# Patient Record
Sex: Male | Born: 1950 | Race: Black or African American | Hispanic: No | Marital: Married | Smoking: Former smoker
Health system: Southern US, Community
[De-identification: ages and names within clinical notes are randomized; demographics above are authoritative.]

## PROBLEM LIST (undated history)

## (undated) DIAGNOSIS — R7302 Impaired glucose tolerance (oral): Secondary | ICD-10-CM

## (undated) DIAGNOSIS — D352 Benign neoplasm of pituitary gland: Secondary | ICD-10-CM

## (undated) DIAGNOSIS — E785 Hyperlipidemia, unspecified: Secondary | ICD-10-CM

## (undated) DIAGNOSIS — M1990 Unspecified osteoarthritis, unspecified site: Secondary | ICD-10-CM

## (undated) HISTORY — DX: Impaired glucose tolerance (oral): R73.02

## (undated) HISTORY — DX: Unspecified osteoarthritis, unspecified site: M19.90

## (undated) HISTORY — DX: Hyperlipidemia, unspecified: E78.5

## (undated) HISTORY — DX: Benign neoplasm of pituitary gland: D35.2

---

## 2006-10-06 DIAGNOSIS — D352 Benign neoplasm of pituitary gland: Secondary | ICD-10-CM

## 2006-10-06 HISTORY — DX: Benign neoplasm of pituitary gland: D35.2

## 2007-07-28 ENCOUNTER — Encounter: Admission: RE | Admit: 2007-07-28 | Discharge: 2007-07-28 | Payer: Self-pay | Admitting: Neurosurgery

## 2010-05-28 ENCOUNTER — Encounter: Admission: RE | Admit: 2010-05-28 | Discharge: 2010-05-28 | Payer: Self-pay | Admitting: Family Medicine

## 2010-07-28 ENCOUNTER — Encounter: Payer: Self-pay | Admitting: Family Medicine

## 2010-07-30 ENCOUNTER — Encounter: Payer: Self-pay | Admitting: Neurosurgery

## 2011-07-09 ENCOUNTER — Ambulatory Visit (INDEPENDENT_AMBULATORY_CARE_PROVIDER_SITE_OTHER): Payer: PRIVATE HEALTH INSURANCE

## 2011-07-09 DIAGNOSIS — J3081 Allergic rhinitis due to animal (cat) (dog) hair and dander: Secondary | ICD-10-CM

## 2011-07-09 DIAGNOSIS — J069 Acute upper respiratory infection, unspecified: Secondary | ICD-10-CM

## 2012-01-22 ENCOUNTER — Ambulatory Visit: Payer: PRIVATE HEALTH INSURANCE

## 2012-01-22 ENCOUNTER — Ambulatory Visit (INDEPENDENT_AMBULATORY_CARE_PROVIDER_SITE_OTHER): Payer: PRIVATE HEALTH INSURANCE | Admitting: Emergency Medicine

## 2012-01-22 VITALS — BP 126/70 | HR 86 | Temp 98.0°F | Resp 16 | Ht 65.0 in | Wt 166.0 lb

## 2012-01-22 DIAGNOSIS — M79609 Pain in unspecified limb: Secondary | ICD-10-CM

## 2012-01-22 DIAGNOSIS — M79644 Pain in right finger(s): Secondary | ICD-10-CM

## 2012-01-22 NOTE — Progress Notes (Signed)
  Subjective:    Patient ID: Earl Thomas, male    DOB: May 10, 1951, 61 y.o.   MRN: [id number]  HPI patient was in good health until last week when while grilling he stuck his hand into the grill and when he pulled it out he was unable to extend his distal phalanx of the right middle finger    Review of Systems     Objective:   Physical Exam patient is unable to extend at the DIP joint right middle    [facility] reading (PRIMARY) by  Dr.Nancie Bocanegra no fractures seen       Assessment & Plan:  Patient has a rupture of the extensor tendon to right middle finger. He is placed in a Stax splint advised this will take 6-8 weeks to heal. He was given the option to see an orthopedist and chooses not to see them at the present time but will let us know if he desires orthopedic referral .

## 2012-02-11 ENCOUNTER — Ambulatory Visit (INDEPENDENT_AMBULATORY_CARE_PROVIDER_SITE_OTHER): Payer: PRIVATE HEALTH INSURANCE | Admitting: Family Medicine

## 2012-02-11 VITALS — BP 130/80 | HR 98 | Temp 98.5°F | Resp 16 | Ht 65.5 in | Wt 164.0 lb

## 2012-02-11 DIAGNOSIS — M6688 Spontaneous rupture of other tendons, other: Secondary | ICD-10-CM

## 2012-02-11 DIAGNOSIS — E785 Hyperlipidemia, unspecified: Secondary | ICD-10-CM

## 2012-02-11 DIAGNOSIS — Z0000 Encounter for general adult medical examination without abnormal findings: Secondary | ICD-10-CM

## 2012-02-11 DIAGNOSIS — M66849 Spontaneous rupture of other tendons, unspecified hand: Secondary | ICD-10-CM

## 2012-02-11 LAB — POCT CBC
Granulocyte percent: 53 %G (ref 37–80)
HCT, POC: 50.4 % (ref 43.5–53.7)
Hemoglobin: 16 g/dL (ref 14.1–18.1)
Lymph, poc: 1.8 (ref 0.6–3.4)
MCH, POC: 28.2 pg (ref 27–31.2)
MCHC: 31.7 g/dL — AB (ref 31.8–35.4)
MCV: 88.9 fL (ref 80–97)
MID (cbc): 0.4 (ref 0–0.9)
MPV: 9.6 fL (ref 0–99.8)
POC Granulocyte: 2.5 (ref 2–6.9)
POC LYMPH PERCENT: 39.2 %L (ref 10–50)
POC MID %: 7.8 %M (ref 0–12)
Platelet Count, POC: 191 10*3/uL (ref 142–424)
RBC: 5.67 M/uL (ref 4.69–6.13)
RDW, POC: 14.9 %
WBC: 4.7 10*3/uL (ref 4.6–10.2)

## 2012-02-11 LAB — COMPREHENSIVE METABOLIC PANEL
ALT: 27 U/L (ref 0–53)
AST: 24 U/L (ref 0–37)
Albumin: 4.4 g/dL (ref 3.5–5.2)
Alkaline Phosphatase: 42 U/L (ref 39–117)
BUN: 11 mg/dL (ref 6–23)
CO2: 26 mEq/L (ref 19–32)
Calcium: 9.3 mg/dL (ref 8.4–10.5)
Chloride: 104 mEq/L (ref 96–112)
Creat: 1.29 mg/dL (ref 0.50–1.35)
Glucose, Bld: 81 mg/dL (ref 70–99)
Potassium: 4.4 mEq/L (ref 3.5–5.3)
Sodium: 140 mEq/L (ref 135–145)
Total Bilirubin: 0.8 mg/dL (ref 0.3–1.2)
Total Protein: 7.2 g/dL (ref 6.0–8.3)

## 2012-02-11 LAB — POCT URINALYSIS DIPSTICK
Bilirubin, UA: NEGATIVE
Blood, UA: NEGATIVE
Glucose, UA: NEGATIVE
Ketones, UA: NEGATIVE
Leukocytes, UA: NEGATIVE
Nitrite, UA: NEGATIVE
Protein, UA: NEGATIVE
Spec Grav, UA: 1.02
Urobilinogen, UA: 0.2
pH, UA: 6

## 2012-02-11 LAB — POCT UA - MICROSCOPIC ONLY
Bacteria, U Microscopic: NEGATIVE
Casts, Ur, LPF, POC: NEGATIVE
Crystals, Ur, HPF, POC: NEGATIVE
Epithelial cells, urine per micros: NEGATIVE
Mucus, UA: NEGATIVE
RBC, urine, microscopic: NEGATIVE
WBC, Ur, HPF, POC: NEGATIVE
Yeast, UA: NEGATIVE

## 2012-02-11 LAB — IFOBT (OCCULT BLOOD): IFOBT: NEGATIVE

## 2012-02-11 LAB — LIPID PANEL
Cholesterol: 222 mg/dL — ABNORMAL HIGH (ref 0–200)
HDL: 41 mg/dL (ref >39)
LDL Cholesterol: 163 mg/dL — ABNORMAL HIGH (ref 0–99)
Total CHOL/HDL Ratio: 5.4 Ratio
Triglycerides: 92 mg/dL (ref <150)
VLDL: 18 mg/dL (ref 0–40)

## 2012-02-11 NOTE — Patient Instructions (Signed)
Return next month in about 4 weeks for a recheck of that finger so we can get you out of the splint.

## 2012-02-11 NOTE — Progress Notes (Signed)
CPE:  Patient is here for his annual physical examination. He has been doing well. No major problems or concerns. He had an extensor tendon rupture his right third finger that Dr. Everlene Farrier treat him for recently. He is continuing to wear the splint. It has been about 3 weeks. No major new problems or concerns.  Past medical history: Patient has a history of hyperlipidemia labs were checked at the last physical one year ago with cholesterol 209 and LDL 144. This was much better than the values a year earlier. Past surgical history: None. Has had a colonoscopy. Vacation allergies: None Current medications: Pravachol 40 mg daily Aspirin 1 daily Vitamin C and D3 and E   Family history: Mother is living, soon-to-be 76, has had some heart problems. Father deceased Siblings and children all living  Social history: Patient is married, and his wife is his only sexual partner. He does not smoke drink or use alcohol. 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 He regularly goes to [place] where he is part of a conference.  Review of systems: Constitutional: Unremarkable HEENT: Unremarkable Respiratory: Unremarkable Cardiovascular: Unremarkable Gastrointestinal: Unremarkable. He does get some growling of his abdomen. 2 urinary unremarkable Musculoskeletal unremarkable. He gets some discomfort in his hands and works the joints and it seems to relieve it. The middle finger of the right hand is as noted above. Dermatologic: Unremarkable Neurologic: Unremarkable Hematologic: Unremarkable Psychiatric: Unremarkable Endocrinologic: Unremarkable  Physical examination: Well-developed well-nourished man in no acute distress. TMs are normal. Eyes PERRLA. Throat clear. Teeth good. Neck supple without nodes or thyromegaly. No carotid bruits.  Chest clear to auscultation. Heart regular without murmurs gallops or arrhythmias. No axillary or inguinal nodes. Abdomen soft without masses or tenderness. Normal male external genitalia with testes descended. No nodules were noted. Digital rectal exam reveals no hemorrhoids and prostate gland feels normal except for right lobe of the gland is a little larger than left but no nodules. I note that his last PSA was entirely normal. Extremities unremarkable.  Assessment: Normal physical examination Hyperlipidemia Mild asymmetry of prostate gland Extensor tendon rupture right third finger  Plan: Check labs Represcribed his medication Return annually or when necessary

## 2012-02-12 LAB — PSA: PSA: 0.73 ng/mL (ref <=4.00)

## 2012-02-13 ENCOUNTER — Telehealth: Payer: Self-pay

## 2012-02-13 NOTE — Telephone Encounter (Signed)
RX FROM RECENT VISIT WAS SUPPOSED TO BE CALLED IN TO PHARMACY AND IS NOT THERE.  [facility] [phone]  [facility] [place], [place].

## 2012-02-14 MED ORDER — PRAVASTATIN SODIUM 40 MG PO TABS: 40.0000 mg | ORAL_TABLET | Freq: Every day | ORAL | Status: DC

## 2012-02-14 NOTE — Telephone Encounter (Signed)
Called pt to advise was sent in for him.

## 2012-02-16 ENCOUNTER — Encounter: Payer: Self-pay | Admitting: Family Medicine

## 2012-03-10 ENCOUNTER — Ambulatory Visit (INDEPENDENT_AMBULATORY_CARE_PROVIDER_SITE_OTHER): Payer: PRIVATE HEALTH INSURANCE | Admitting: Family Medicine

## 2012-03-10 VITALS — BP 110/63 | HR 84 | Temp 97.7°F | Resp 18 | Ht 64.75 in | Wt 167.0 lb

## 2012-03-10 DIAGNOSIS — M20019 Mallet finger of unspecified finger(s): Secondary | ICD-10-CM

## 2012-03-10 DIAGNOSIS — E785 Hyperlipidemia, unspecified: Secondary | ICD-10-CM

## 2012-03-10 HISTORY — DX: Hyperlipidemia, unspecified: E78.5

## 2012-03-10 MED ORDER — PRAVASTATIN SODIUM 40 MG PO TABS: 40.0000 mg | ORAL_TABLET | Freq: Every day | ORAL | Status: DC

## 2012-03-10 NOTE — Progress Notes (Signed)
Subjective: Patient is here for recheck of the mallet finger. He thinks he is doing better. When he has the dressing off it stays straight.  Objective: Fingers is straight, can minimally flex it.  Assessment: Mallet finger secondary to tendon rupture, much improved  Plan: Gently advance activity. Avoid carrying heavy things in that hand. Return if problems. Can leave brace off.

## 2012-03-10 NOTE — Patient Instructions (Addendum)
Gentle finger exercises .  Return if problems.

## 2012-05-19 ENCOUNTER — Other Ambulatory Visit: Payer: Self-pay | Admitting: Family Medicine

## 2012-10-22 ENCOUNTER — Ambulatory Visit (INDEPENDENT_AMBULATORY_CARE_PROVIDER_SITE_OTHER): Payer: PRIVATE HEALTH INSURANCE | Admitting: Family Medicine

## 2012-10-22 VITALS — BP 133/78 | HR 88 | Temp 97.9°F | Resp 16 | Ht 65.0 in | Wt 161.0 lb

## 2012-10-22 DIAGNOSIS — R300 Dysuria: Secondary | ICD-10-CM

## 2012-10-22 DIAGNOSIS — N390 Urinary tract infection, site not specified: Secondary | ICD-10-CM

## 2012-10-22 LAB — POCT URINALYSIS DIPSTICK
Bilirubin, UA: NEGATIVE
Blood, UA: NEGATIVE
Glucose, UA: NEGATIVE
Ketones, UA: NEGATIVE
Leukocytes, UA: NEGATIVE
Nitrite, UA: NEGATIVE
Protein, UA: NEGATIVE
Spec Grav, UA: 1.025
Urobilinogen, UA: 0.2
pH, UA: 6.5

## 2012-10-22 LAB — POCT UA - MICROSCOPIC ONLY
Bacteria, U Microscopic: 2
Casts, Ur, LPF, POC: NEGATIVE
Crystals, Ur, HPF, POC: NEGATIVE
Yeast, UA: NEGATIVE

## 2012-10-22 MED ORDER — CIPROFLOXACIN HCL 250 MG PO TABS: 250.0000 mg | ORAL_TABLET | Freq: Two times a day (BID) | ORAL | Status: DC

## 2012-10-22 NOTE — Patient Instructions (Signed)
Urinary Tract Infection Urinary tract infections (UTIs) can develop anywhere along your urinary tract. Your urinary tract is your body's drainage system for removing wastes and extra water. Your urinary tract includes two kidneys, two ureters, a bladder, and a urethra. Your kidneys are a pair of bean-shaped organs. Each kidney is about the size of your fist. They are located below your ribs, one on each side of your spine. CAUSES Infections are caused by microbes, which are microscopic organisms, including fungi, viruses, and bacteria. These organisms are so small that they can only be seen through a microscope. Bacteria are the microbes that most commonly cause UTIs. SYMPTOMS  Symptoms of UTIs may vary by age and gender of the patient and by the location of the infection. Symptoms in young women typically include a frequent and intense urge to urinate and a painful, burning feeling in the bladder or urethra during urination. Older women and men are more likely to be tired, shaky, and weak and have muscle aches and abdominal pain. A fever may mean the infection is in your kidneys. Other symptoms of a kidney infection include pain in your back or sides below the ribs, nausea, and vomiting. DIAGNOSIS To diagnose a UTI, your caregiver will ask you about your symptoms. Your caregiver also will ask to provide a urine sample. The urine sample will be tested for bacteria and white blood cells. White blood cells are made by your body to help fight infection. TREATMENT  Typically, UTIs can be treated with medication. Because most UTIs are caused by a bacterial infection, they usually can be treated with the use of antibiotics. The choice of antibiotic and length of treatment depend on your symptoms and the type of bacteria causing your infection. HOME CARE INSTRUCTIONS  If you were prescribed antibiotics, take them exactly as your caregiver instructs you. Finish the medication even if you feel better after you  have only taken some of the medication.  Drink enough water and fluids to keep your urine clear or pale yellow.  Avoid caffeine, tea, and carbonated beverages. They tend to irritate your bladder.  Empty your bladder often. Avoid holding urine for long periods of time.  Empty your bladder before and after sexual intercourse.  After a bowel movement, women should cleanse from front to back. Use each tissue only once. SEEK MEDICAL CARE IF:   You have back pain.  You develop a fever.  Your symptoms do not begin to resolve within 3 days. SEEK IMMEDIATE MEDICAL CARE IF:   You have severe back pain or lower abdominal pain.  You develop chills.  You have nausea or vomiting.  You have continued burning or discomfort with urination. MAKE SURE YOU:   Understand these instructions.  Will watch your condition.  Will get help right away if you are not doing well or get worse. Document Released: 04/03/2005 Document Revised: 12/24/2011 Document Reviewed: 08/02/2011 [facility] Patient Information 2013 [place].   Take antibiotic one twice daily. If problems persist please return.

## 2012-10-22 NOTE — Progress Notes (Signed)
Subjective: 62 year old man who's been having some dysuria for the past week. He had a problem about a year ago with this. He's not been any fever. No flank pain. No nausea or vomiting. No nocturia. It just burns when he pees. He has no history of STDs. No sex outside of marriage.  Objective: No CVA tenderness. Abdomen soft without mass or tenderness. Normal male external genitalia. Penis appears normal. No discharge. No hernias.  Results for orders placed in visit on 10/22/12  POCT URINALYSIS DIPSTICK      Result Value Range   Color, UA yellow     Clarity, UA clear     Glucose, UA neg     Bilirubin, UA neg     Ketones, UA neg     Spec Grav, UA 1.025     Blood, UA neg     pH, UA 6.5     Protein, UA neg     Urobilinogen, UA 0.2     Nitrite, UA neg     Leukocytes, UA Negative    POCT UA - MICROSCOPIC ONLY      Result Value Range   WBC, Ur, HPF, POC 8-19     RBC, urine, microscopic 3-11     Bacteria, U Microscopic 2     Mucus, UA large     Epithelial cells, urine per micros 0-3     Crystals, Ur, HPF, POC neg     Casts, Ur, LPF, POC neg     Yeast, UA neg     Assessment: UTI  Plan: Cipro 250 twice a day  Return if worse.

## 2012-10-23 LAB — URINE CULTURE
Colony Count: NO GROWTH
Organism ID, Bacteria: NO GROWTH

## 2013-01-27 ENCOUNTER — Ambulatory Visit (INDEPENDENT_AMBULATORY_CARE_PROVIDER_SITE_OTHER): Payer: PRIVATE HEALTH INSURANCE | Admitting: Family Medicine

## 2013-01-27 VITALS — BP 104/62 | HR 81 | Temp 97.8°F | Resp 18 | Ht 68.0 in | Wt 168.6 lb

## 2013-01-27 DIAGNOSIS — E785 Hyperlipidemia, unspecified: Secondary | ICD-10-CM

## 2013-01-27 DIAGNOSIS — Z0000 Encounter for general adult medical examination without abnormal findings: Secondary | ICD-10-CM

## 2013-01-27 DIAGNOSIS — R319 Hematuria, unspecified: Secondary | ICD-10-CM

## 2013-01-27 LAB — POCT URINALYSIS DIPSTICK
Bilirubin, UA: NEGATIVE
Glucose, UA: NEGATIVE
Ketones, UA: NEGATIVE
Leukocytes, UA: NEGATIVE
Nitrite, UA: NEGATIVE
Protein, UA: NEGATIVE
Spec Grav, UA: 1.02
Urobilinogen, UA: 0.2
pH, UA: 5.5

## 2013-01-27 LAB — POCT UA - MICROSCOPIC ONLY
Bacteria, U Microscopic: NEGATIVE
Casts, Ur, LPF, POC: NEGATIVE
Crystals, Ur, HPF, POC: NEGATIVE
Epithelial cells, urine per micros: NEGATIVE
Mucus, UA: NEGATIVE
RBC, urine, microscopic: NEGATIVE
WBC, Ur, HPF, POC: NEGATIVE
Yeast, UA: NEGATIVE

## 2013-01-27 LAB — POCT CBC
Granulocyte percent: 53.1 %G (ref 37–80)
HCT, POC: 48.1 % (ref 43.5–53.7)
Hemoglobin: 15.4 g/dL (ref 14.1–18.1)
Lymph, poc: 1.7 (ref 0.6–3.4)
MCH, POC: 28.8 pg (ref 27–31.2)
MCHC: 32 g/dL (ref 31.8–35.4)
MCV: 89.9 fL (ref 80–97)
MID (cbc): 0.5 (ref 0–0.9)
MPV: 9.7 fL (ref 0–99.8)
POC Granulocyte: 2.5 (ref 2–6.9)
POC LYMPH PERCENT: 36.2 %L (ref 10–50)
POC MID %: 10.7 %M (ref 0–12)
Platelet Count, POC: 181 10*3/uL (ref 142–424)
RBC: 5.35 M/uL (ref 4.69–6.13)
RDW, POC: 14.7 %
WBC: 4.7 10*3/uL (ref 4.6–10.2)

## 2013-01-27 LAB — LIPID PANEL
Cholesterol: 234 mg/dL — ABNORMAL HIGH (ref 0–200)
HDL: 41 mg/dL (ref >39)
LDL Cholesterol: 176 mg/dL — ABNORMAL HIGH (ref 0–99)
Total CHOL/HDL Ratio: 5.7 Ratio
Triglycerides: 87 mg/dL (ref <150)
VLDL: 17 mg/dL (ref 0–40)

## 2013-01-27 LAB — COMPREHENSIVE METABOLIC PANEL
ALT: 19 U/L (ref 0–53)
AST: 20 U/L (ref 0–37)
Albumin: 4.6 g/dL (ref 3.5–5.2)
Alkaline Phosphatase: 39 U/L (ref 39–117)
BUN: 13 mg/dL (ref 6–23)
CO2: 27 mEq/L (ref 19–32)
Calcium: 9.3 mg/dL (ref 8.4–10.5)
Chloride: 104 mEq/L (ref 96–112)
Creat: 1.34 mg/dL (ref 0.50–1.35)
Glucose, Bld: 108 mg/dL — ABNORMAL HIGH (ref 70–99)
Potassium: 4.2 mEq/L (ref 3.5–5.3)
Sodium: 138 mEq/L (ref 135–145)
Total Bilirubin: 0.7 mg/dL (ref 0.3–1.2)
Total Protein: 7 g/dL (ref 6.0–8.3)

## 2013-01-27 LAB — TSH: TSH: 2.33 u[IU]/mL (ref 0.350–4.500)

## 2013-01-27 LAB — IFOBT (OCCULT BLOOD): IFOBT: NEGATIVE

## 2013-01-27 LAB — PSA: PSA: 0.67 ng/mL (ref <=4.00)

## 2013-01-27 MED ORDER — PRAVASTATIN SODIUM 40 MG PO TABS: 40.0000 mg | ORAL_TABLET | Freq: Every day | ORAL | Status: DC

## 2013-01-27 NOTE — Progress Notes (Signed)
[facility]:  Office Visit  Chief Complaint:  Chief Complaint  Patient presents with  . Annual Exam    HPI: Earl Thomas is a 62 y.o. male who complains of  Here for annual exam He is UTD on his colonoscopy, off of [place] , ? Dr. Benson [place]. He had polyps, normal pathology. He is supposed to return in 5 years.  His father may have had prostate cancer.  He goes to the bathroom once nighty, no urinary sxs Had infection 1 month ago but no sxs since Dad with history of prostate cancer He has XOL, is compliant, denies SEs  Past Medical History  Diagnosis Date  . Hyperlipidemia    History reviewed. No pertinent past surgical history. History   Social History  . Marital Status: Married    Spouse Name: N/A    Number of Children: N/A  . Years of Education: N/A   Social History Main Topics  . Smoking status: Never Smoker   . Smokeless tobacco: None  . Alcohol Use: No  . Drug Use: No  . Sexually Active: None   Other Topics Concern  . None   Social History Narrative  . None   Family History  Problem Relation Age of Onset  . Cancer Father    No Known Allergies Prior to Admission medications   Medication Sig Start Date End Date Taking? Authorizing Provider  aspirin 81 MG tablet Take 81 mg by mouth daily.   Yes Historical Provider, MD  cholecalciferol (VITAMIN D) 1000 UNITS tablet Take 1,000 Units by mouth daily.   Yes Historical Provider, MD  pravastatin (PRAVACHOL) 40 MG tablet TAKE ONE TABLET BY MOUTH EVERY DAY 05/19/12  Yes Eleanore Kurtis Bushman, PA-C  vitamin C (ASCORBIC ACID) 500 MG tablet Take 500 mg by mouth daily.   Yes Historical Provider, MD  vitamin E 400 UNIT capsule Take 400 Units by mouth daily.   Yes Historical Provider, MD  ciprofloxacin (CIPRO) 250 MG tablet Take 1 tablet (250 mg total) by mouth 2 (two) times daily. 10/22/12   Posey Boyer, MD     ROS: The patient denies fevers, chills, night sweats, unintentional weight loss, chest  pain, palpitations, wheezing, dyspnea on exertion, nausea, vomiting, abdominal pain, dysuria, hematuria, melena, numbness, weakness, or tingling.   All other systems have been reviewed and were otherwise negative with the exception of those mentioned in the HPI and as above.    PHYSICAL EXAM: Filed Vitals:   01/27/13 0905  BP: 104/62  Pulse: 81  Temp: 97.8 F (36.6 C)  Resp: 18   Filed Vitals:   01/27/13 0905  Height: 5' 8" (1.727 m)  Weight: 168 lb 9.6 oz (76.476 kg)   Body mass index is 25.64 kg/(m^2).  General: Alert, no acute distress HEENT:  Normocephalic, atraumatic, oropharynx patent. EOMI, PERRA, fundoscopic exam is normal Cardiovascular:  Regular rate and rhythm, no rubs murmurs or gallops.  No Carotid bruits, radial pulse intact. No pedal edema.  Respiratory: Clear to auscultation bilaterally.  No wheezes, rales, or rhonchi.  No cyanosis, no use of accessory musculature GI: No organomegaly, abdomen is soft and non-tender, positive bowel sounds.  No masses. Skin: No rashes. Neurologic: Facial musculature symmetric. Psychiatric: Patient is appropriate throughout our interaction. Lymphatic: No cervical lymphadenopathy Musculoskeletal: Gait intact. 5/5 strength, ROM intact 2/2 DTRs GU-normal, rectal exam is normal except for hemorrhoids   LABS: Results for orders placed in visit on 01/27/13  POCT CBC  Result Value Range   WBC 4.7  4.6 - 10.2 K/uL   Lymph, poc 1.7  0.6 - 3.4   POC LYMPH PERCENT 36.2  10 - 50 %L   MID (cbc) 0.5  0 - 0.9   POC MID % 10.7  0 - 12 %M   POC Granulocyte 2.5  2 - 6.9   Granulocyte percent 53.1  37 - 80 %G   RBC 5.35  4.69 - 6.13 M/uL   Hemoglobin 15.4  14.1 - 18.1 g/dL   HCT, POC 48.1  43.5 - 53.7 %   MCV 89.9  80 - 97 fL   MCH, POC 28.8  27 - 31.2 pg   MCHC 32.0  31.8 - 35.4 g/dL   RDW, POC 14.7     Platelet Count, POC 181  142 - 424 K/uL   MPV 9.7  0 - 99.8 fL  IFOBT (OCCULT BLOOD)      Result Value Range   IFOBT Negative     POCT UA - MICROSCOPIC ONLY      Result Value Range   WBC, Ur, HPF, POC neg     RBC, urine, microscopic neg     Bacteria, U Microscopic neg     Mucus, UA neg     Epithelial cells, urine per micros neg     Crystals, Ur, HPF, POC neg     Casts, Ur, LPF, POC neg     Yeast, UA neg    POCT URINALYSIS DIPSTICK      Result Value Range   Color, UA yellow     Clarity, UA clear     Glucose, UA neg     Bilirubin, UA neg     Ketones, UA neg     Spec Grav, UA 1.020     Blood, UA trace-intact     pH, UA 5.5     Protein, UA neg     Urobilinogen, UA 0.2     Nitrite, UA neg     Leukocytes, UA Negative       EKG/XRAY:   Primary read interpreted by Dr. Marin Comment at [facility].   ASSESSMENT/PLAN: Encounter Diagnoses  Name Primary?  . Annual physical exam Yes  . Hyperlipidemia   . Hematuria    Will f/u in 2-3 months for hematuria. Was not present last year Nonsmoker currently, smoked previor 30 yrs ago, no chemcial exposures Refilled Pravachol Annual exam labs pending    LE, Mill[place], DO 01/27/2013 10:29 AM

## 2013-05-06 ENCOUNTER — Other Ambulatory Visit: Payer: Self-pay | Admitting: Physician Assistant

## 2013-05-06 DIAGNOSIS — Z23 Encounter for immunization: Secondary | ICD-10-CM

## 2013-05-06 MED ORDER — ZOSTER VACCINE LIVE 19400 UNT/0.65ML ~~LOC~~ SOLR: 0.6500 mL | Freq: Once | SUBCUTANEOUS | Status: DC

## 2013-11-09 ENCOUNTER — Ambulatory Visit (INDEPENDENT_AMBULATORY_CARE_PROVIDER_SITE_OTHER): Payer: BC Managed Care – PPO | Admitting: Family Medicine

## 2013-11-09 VITALS — BP 110/70 | HR 90 | Temp 98.5°F | Resp 16 | Ht 65.0 in | Wt 163.0 lb

## 2013-11-09 DIAGNOSIS — K602 Anal fissure, unspecified: Secondary | ICD-10-CM

## 2013-11-09 DIAGNOSIS — K921 Melena: Secondary | ICD-10-CM

## 2013-11-09 MED ORDER — HYDROCORTISONE 2.5 % RE CREA: 1.0000 "application " | TOPICAL_CREAM | Freq: Two times a day (BID) | RECTAL | Status: DC

## 2013-11-09 NOTE — Progress Notes (Signed)
Subjective:    Patient ID: Earl Thomas, male    DOB: Apr 24, 1951, 63 y.o.   MRN: [id number]  Rectal Bleeding  Pertinent negatives include no fever, no abdominal pain, no diarrhea, no nausea and no vomiting.    This chart was scribed for Robyn Haber, MD by Erling Conte, ED Scribe. This patient was seen in room Room 3 and the patient's care was started at 9:05 AM    HPI Comments: Earl Thomas is a 63 y.o. male who presents to the [facility] complaining of intermittent episodic rectal bleeding onset for 5 months. Patient states that these episodes occur approximately every 2 weeks. He reports that the blood is dark in nature.  Patient reports that he has had 2 colonoscopy's in the past 5 years. He also states that he regular prostate exams once a year. Patient denies any associated abdominal pain, fever, nausea, emesis or diarrhea  Patient previously lived in [place], [place]. 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 Patient is originally from [place], [place].  Review of Systems  Constitutional: Negative for fever.  Gastrointestinal: Positive for hematochezia and anal bleeding. Negative for nausea, vomiting, abdominal pain and diarrhea.  All other systems reviewed and are negative.      Objective:   Physical Exam  Nursing note and vitals reviewed. Constitutional: He appears well-developed and well-nourished. No distress.  HENT:  Head: Normocephalic and atraumatic.  Neck: Neck supple.  Pulmonary/Chest: Effort normal.  Genitourinary:  Rectal exam shows posterior fissure  Neurological: He is alert.  Skin: He is not diaphoretic.   The fissure has a very irritated base.       Assessment & Plan:  Anal fissure - Plan: hydrocortisone (ANUSOL-HC) 2.5 % rectal cream  Hematochezia Recheck in 2 weeks  Signed, Robyn Haber, MD

## 2013-11-09 NOTE — Patient Instructions (Signed)
  Take Colace once a day Recheck the inflamed area in two weeks  Anal Fissure, Adult An anal fissure is a small tear or crack in the skin around the anus. Bleeding from a fissure usually stops on its own within a few minutes. However, bleeding will often reoccur with each bowel movement until the crack heals.  CAUSES   Passing large, hard stools.  Frequent diarrheal stools.  Constipation.  Inflammatory bowel disease (Crohn's disease or ulcerative colitis).  Infections.  Anal sex. SYMPTOMS   Small amounts of blood seen on your stools, on toilet paper, or in the toilet after a bowel movement.  Rectal bleeding.  Painful bowel movements.  Itching or irritation around the anus. DIAGNOSIS Your caregiver will examine the anal area. An anal fissure can usually be seen with careful inspection. A rectal exam may be performed and a short tube (anoscope) may be used to examine the anal canal. TREATMENT   You may be instructed to take fiber supplements. These supplements can soften your stool to help make bowel movements easier.  Sitz baths may be recommended to help heal the tear. Do not use soap in the sitz baths.  A medicated cream or ointment may be prescribed to lessen discomfort. HOME CARE INSTRUCTIONS   Maintain a diet high in fruits, whole grains, and vegetables. Avoid constipating foods like bananas and dairy products.  Take sitz baths as directed by your caregiver.  Drink enough fluids to keep your urine clear or pale yellow.  Only take over-the-counter or prescription medicines for pain, discomfort, or fever as directed by your caregiver. Do not take aspirin as this may increase bleeding.  Do not use ointments containing numbing medications (anesthetics) or hydrocortisone. They could slow healing. SEEK MEDICAL CARE IF:   Your fissure is not completely healed within 3 days.  You have further bleeding.  You have a fever.  You have diarrhea mixed with blood.  You  have pain.  Your problem is getting worse rather than better. MAKE SURE YOU:   Understand these instructions.  Will watch your condition.  Will get help right away if you are not doing well or get worse. Document Released: 06/24/2005 Document Revised: 09/16/2011 Document Reviewed: 12/09/2010 [facility] Patient Information 2014 [place], [place].

## 2013-11-29 ENCOUNTER — Ambulatory Visit (INDEPENDENT_AMBULATORY_CARE_PROVIDER_SITE_OTHER): Payer: BC Managed Care – PPO | Admitting: Family Medicine

## 2013-11-29 VITALS — BP 126/74 | HR 90 | Temp 98.4°F | Resp 17 | Ht 65.0 in | Wt 160.0 lb

## 2013-11-29 DIAGNOSIS — K625 Hemorrhage of anus and rectum: Secondary | ICD-10-CM

## 2013-11-29 DIAGNOSIS — K602 Anal fissure, unspecified: Secondary | ICD-10-CM

## 2013-11-29 NOTE — Progress Notes (Signed)
° °  Subjective:    Patient ID: Earl Thomas, male    DOB: 1951/03/28, 63 y.o.   MRN: [id number]  HPI Chief Complaint  Patient presents with   Follow-up    hemorrhoids    This chart was scribed for Robyn Haber, MD by Thea Alken, ED Scribe. This patient was seen in room 11 and the patient's care was started at 10:10 AM.  HPI Comments: Earl Thomas is a 63 y.o. male who presents to the [facility] here for a follow up regarding hemorrhoids. Pt reports his rectal bleeding has subsided. Pt denies abdominal pain, rectal pain and rectal bleeding. Pt reports his last colonoscopy was 2 years ago.  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  Past Medical History  Diagnosis Date   Hyperlipidemia    No Known Allergies Prior to Admission medications   Medication Sig Start Date End Date Taking? Authorizing Provider  aspirin 81 MG tablet Take 81 mg by mouth daily.   Yes Historical Provider, MD  cholecalciferol (VITAMIN D) 1000 UNITS tablet Take 1,000 Units by mouth daily.   Yes Historical Provider, MD  hydrocortisone (ANUSOL-HC) 2.5 % rectal cream Place 1 application rectally 2 (two) times daily. 11/09/13  Yes Robyn Haber, MD  pravastatin (PRAVACHOL) 40 MG tablet Take 1 tablet (40 mg total) by mouth daily. 01/27/13  Yes Thao P Le, DO  vitamin E 400 UNIT capsule Take 400 Units by mouth daily.   Yes Historical Provider, MD   Review of Systems  Constitutional: Negative for fever and chills.  Gastrointestinal: Negative for abdominal pain, blood in stool, anal bleeding and rectal pain.      Objective:   Physical Exam  Nursing note and vitals reviewed. Constitutional: He is oriented to person, place, and time. He appears well-developed and well-nourished. No distress.  HENT:  Head: Normocephalic and atraumatic.  Eyes: EOM are normal.  Neck: Neck supple. No tracheal deviation present.  Cardiovascular: Normal rate.   Pulmonary/Chest: Effort normal.    Genitourinary:  Rectal exam normal  Musculoskeletal: Normal range of motion.  Neurological: He is alert and oriented to person, place, and time.  Skin: Skin is warm and dry.  Psychiatric: He has a normal mood and affect. His behavior is normal.   Assessment & Plan:  Fissure in ano  Rectal bleeding   Resolved.   Return for further bleeding  Robyn Haber, MD

## 2014-01-20 ENCOUNTER — Ambulatory Visit (INDEPENDENT_AMBULATORY_CARE_PROVIDER_SITE_OTHER): Payer: BC Managed Care – PPO | Admitting: Family Medicine

## 2014-01-20 ENCOUNTER — Encounter: Payer: Self-pay | Admitting: Family Medicine

## 2014-01-20 VITALS — BP 120/74 | HR 79 | Temp 97.8°F | Resp 16 | Ht 65.0 in | Wt 162.0 lb

## 2014-01-20 DIAGNOSIS — Z0000 Encounter for general adult medical examination without abnormal findings: Secondary | ICD-10-CM

## 2014-01-20 DIAGNOSIS — E785 Hyperlipidemia, unspecified: Secondary | ICD-10-CM

## 2014-01-20 DIAGNOSIS — N529 Male erectile dysfunction, unspecified: Secondary | ICD-10-CM

## 2014-01-20 LAB — CBC WITH DIFFERENTIAL/PLATELET
Basophils Absolute: 0 10*3/uL (ref 0.0–0.1)
Basophils Relative: 1 % (ref 0–1)
Eosinophils Absolute: 0.1 10*3/uL (ref 0.0–0.7)
Eosinophils Relative: 3 % (ref 0–5)
HCT: 45.3 % (ref 39.0–52.0)
Hemoglobin: 15 g/dL (ref 13.0–17.0)
Lymphocytes Relative: 34 % (ref 12–46)
Lymphs Abs: 1.4 10*3/uL (ref 0.7–4.0)
MCH: 27.7 pg (ref 26.0–34.0)
MCHC: 33.1 g/dL (ref 30.0–36.0)
MCV: 83.6 fL (ref 78.0–100.0)
Monocytes Absolute: 0.4 10*3/uL (ref 0.1–1.0)
Monocytes Relative: 11 % (ref 3–12)
Neutro Abs: 2 10*3/uL (ref 1.7–7.7)
Neutrophils Relative %: 51 % (ref 43–77)
Platelets: 152 10*3/uL (ref 150–400)
RBC: 5.42 MIL/uL (ref 4.22–5.81)
RDW: 14.8 % (ref 11.5–15.5)
WBC: 4 10*3/uL (ref 4.0–10.5)

## 2014-01-20 LAB — COMPLETE METABOLIC PANEL WITH GFR
ALT: 36 U/L (ref 0–53)
AST: 25 U/L (ref 0–37)
Albumin: 4 g/dL (ref 3.5–5.2)
Alkaline Phosphatase: 41 U/L (ref 39–117)
BUN: 15 mg/dL (ref 6–23)
CO2: 23 mEq/L (ref 19–32)
Calcium: 8.8 mg/dL (ref 8.4–10.5)
Chloride: 106 mEq/L (ref 96–112)
Creat: 1.22 mg/dL (ref 0.50–1.35)
GFR, Est African American: 74 mL/min
GFR, Est Non African American: 64 mL/min
Glucose, Bld: 95 mg/dL (ref 70–99)
Potassium: 3.8 mEq/L (ref 3.5–5.3)
Sodium: 139 mEq/L (ref 135–145)
Total Bilirubin: 0.9 mg/dL (ref 0.2–1.2)
Total Protein: 6.5 g/dL (ref 6.0–8.3)

## 2014-01-20 LAB — LIPID PANEL
Cholesterol: 169 mg/dL (ref 0–200)
HDL: 35 mg/dL — ABNORMAL LOW (ref >39)
LDL Cholesterol: 110 mg/dL — ABNORMAL HIGH (ref 0–99)
Total CHOL/HDL Ratio: 4.8 Ratio
Triglycerides: 122 mg/dL (ref <150)
VLDL: 24 mg/dL (ref 0–40)

## 2014-01-20 LAB — IFOBT (OCCULT BLOOD): IFOBT: NEGATIVE

## 2014-01-20 LAB — POCT URINALYSIS DIPSTICK
Bilirubin, UA: NEGATIVE
Blood, UA: NEGATIVE
Glucose, UA: NEGATIVE
Ketones, UA: NEGATIVE
Leukocytes, UA: NEGATIVE
Nitrite, UA: NEGATIVE
Protein, UA: NEGATIVE
Spec Grav, UA: <=1.005
Urobilinogen, UA: 0.2
pH, UA: 6.5

## 2014-01-20 NOTE — Progress Notes (Signed)
Subjective:  This chart was scribed for Robyn Haber, MD by Jeanell Sparrow, ED Scribe. This patient was seen in room 28 and the patient's care was started at 10:55 AM.   Patient ID: Earl Thomas, male    DOB: 1951/04/28, 63 y.o.   MRN: [id number] Chief Complaint  Patient presents with   Annual Exam   Medication Refill    Pravastatin, pt would like refill on Meloxicam 7.5 mg    HPI HPI Comments: Earl Thomas is a 63 y.o. male who presents to the [facility] complaining of a need for an annual exam and medication refill.   Pt reports that he has been having a good summer. He report that he just got back from a conference [place]. He states that he was there for six days and enjoyed his trip. He states that he went with his family and church. 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  He reports blood in stool while on vacation that has cleared up. He reports no pain.   He reports that has not had a shingles vaccine.   He reports no problems with balance or hearing.   He denies any trouble swallowing or SOB.   He states that he has been physically active. He reports that he tries to use his elliptical everyday.  Past Medical History  Diagnosis Date   Hyperlipidemia    Current Outpatient Prescriptions on File Prior to Visit  Medication Sig Dispense Refill   aspirin 81 MG tablet Take 81 mg by mouth daily.       cholecalciferol (VITAMIN D) 1000 UNITS tablet Take 1,000 Units by mouth daily.       hydrocortisone (ANUSOL-HC) 2.5 % rectal cream Place 1 application rectally 2 (two) times daily.  30 g  0   pravastatin (PRAVACHOL) 40 MG tablet Take 1 tablet (40 mg total) by mouth daily.  90 tablet  3   vitamin E 400 UNIT capsule Take 400 Units by mouth daily.       No current facility-administered medications on file prior to visit.   No Known Allergies   Review of Systems     Objective:   Physical Exam    Nursing note and vitals reviewed. Constitutional: He appears well-developed and well-nourished. No distress.  NAD Ears: Hearing appears normal, canals are normal, external pinna are normal, TMs are normal. Oropharynx: Teeth in good shape, no suspicious lesions, posterior pharynx clear Fundi: Normal, EOM: Normal, sclera and iris: Normal Neck: Supple, no adenopathy, no bruits, no thyromegaly Chest: Clear Heart: Regular, no murmur or gallop Abdomen: Soft nontender, no HSM or masses, umbilical hernia present which is unchanged from last examination Genitalia: Normal male with no hernia Rectal exam: Normal prostate, no external hemorrhoids Psychiatric: Well adjusted, articulate Neurological: Moves easily in the exam room with no discernible movement issues or muscle wasting  Results for orders placed in visit on 01/20/14  IFOBT (OCCULT BLOOD)      Result Value Ref Range   IFOBT Negative    POCT URINALYSIS DIPSTICK      Result Value Ref Range   Color, UA YELLOW     Clarity, UA CLEAR     Glucose, UA NEG     Bilirubin, UA NEG     Ketones, UA NEG     Spec Grav, UA <=1.005     Blood, UA NEG     pH, UA 6.5  Protein, UA NEG     Urobilinogen, UA 0.2     Nitrite, UA NEG     Leukocytes, UA Negative         Assessment & Plan:  Annual physical exam - Plan: Vitamin D, 25-hydroxy, Testosterone, free, total, PSA, Lipid panel, COMPLETE METABOLIC PANEL WITH GFR, CBC with Differential, IFOBT POC (occult bld, rslt in office), POCT urinalysis dipstick  Hyperlipidemia - Plan: Lipid panel  Erectile dysfunction, unspecified erectile dysfunction type - Plan: PSA Patient is having some erectile dysfunction and that his erections are not lasting as long as they had been. He's wondering how to proceed (Viagra versus further workup). I suggest we check a testosterone before going after any additional medications  Signed, Robyn Haber, MD

## 2014-01-21 LAB — VITAMIN D 25 HYDROXY (VIT D DEFICIENCY, FRACTURES): Vit D, 25-Hydroxy: 55 ng/mL (ref 30–89)

## 2014-01-21 LAB — PSA: PSA: 0.69 ng/mL (ref <=4.00)

## 2014-02-10 ENCOUNTER — Other Ambulatory Visit: Payer: Self-pay

## 2014-02-10 ENCOUNTER — Telehealth: Payer: Self-pay | Admitting: *Deleted

## 2014-02-10 DIAGNOSIS — E785 Hyperlipidemia, unspecified: Secondary | ICD-10-CM

## 2014-02-10 MED ORDER — PRAVASTATIN SODIUM 40 MG PO TABS: 40.0000 mg | ORAL_TABLET | Freq: Every day | ORAL | Status: DC

## 2014-02-10 NOTE — Telephone Encounter (Signed)
Pt needs a RF of Pravastatin. 6 mos sent

## 2014-02-10 NOTE — Telephone Encounter (Signed)
Pt called and wanted to know if Dr. Carlean Jews had the results from his annual exam - labs and prostate exam.   I informed pt Dr. Carlean Jews was out of the office until Aug 16th.  Pt also needs refill for Pravachol 40 mg  Pt # [phone]

## 2014-02-10 NOTE — Telephone Encounter (Signed)
Erin took care of refill from VM left on the Lab phone.

## 2014-02-15 ENCOUNTER — Telehealth: Payer: Self-pay

## 2014-02-15 NOTE — Telephone Encounter (Signed)
PT STATES HE HAD AN ANNUAL PE DONE AND NO ONE HAVE CONTACTED HIM REGARDING THE RESULTS. ALSO HADN'T HEARD FROM ANYONE REGARDING HIS CHOL MEDICATION THE PHARMACY HAVE BEEN TRYING TO CONTACT us HE SAYS. PLEASE CALL PT AT [phone]   [facility] ON [address]

## 2014-02-15 NOTE — Telephone Encounter (Signed)
Spoke to pt, he is aware

## 2014-02-21 ENCOUNTER — Encounter: Payer: BC Managed Care – PPO | Admitting: Family Medicine

## 2014-04-21 ENCOUNTER — Encounter: Payer: Self-pay | Admitting: *Deleted

## 2014-06-09 ENCOUNTER — Ambulatory Visit (INDEPENDENT_AMBULATORY_CARE_PROVIDER_SITE_OTHER): Payer: BC Managed Care – PPO | Admitting: Family Medicine

## 2014-06-09 ENCOUNTER — Encounter: Payer: Self-pay | Admitting: Family Medicine

## 2014-06-09 VITALS — BP 130/68 | HR 78 | Temp 98.5°F | Resp 16 | Ht 65.0 in | Wt 161.6 lb

## 2014-06-09 DIAGNOSIS — M722 Plantar fascial fibromatosis: Secondary | ICD-10-CM

## 2014-06-09 MED ORDER — PREDNISONE 20 MG PO TABS: 40.0000 mg | ORAL_TABLET | Freq: Every day | ORAL | Status: DC

## 2014-06-09 NOTE — Progress Notes (Signed)
Subjective:  This chart was scribed for Robyn Haber, MD by Donato Schultz, Medical Scribe. This patient was seen in Room 25 and the patient's care was started at 2:58 PM.   Patient ID: Earl Thomas, male    DOB: [birth year], 63 y.o.   MRN: [id number]  HPI HPI Comments: Earl Thomas is a 63 y.o. male who presents to the [facility] complaining of left constant left heel pain that started a month ago.  He denies any recent injury.  Last week his heel pain radiated to his left leg after walking and being on his feet for a long period of time.  He also noticed some swelling in his fingers.  Both symptoms subsided after he elevated his feet and rested.  He has been applying aspercreme and using Dr. Felicie Morn foot padding with no relief to his symptoms.  He does not exercise regularly.  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  He is also complaining of joint pain in his hands bilaterally.  He denies rash, mouth sores, and visual changes as associated symptoms.  He does not have a history of arthritis.    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  He will experience intermittent headaches that will subside after taking Aleve.  He is able to look up without recreating pain.     Past Medical History  Diagnosis Date   Hyperlipidemia    No past surgical history on file. Family History  Problem Relation Age of Onset   Cancer Father    Cancer Mother    Heart disease Mother    History   Social History   Marital Status: Married    Spouse Name: N/A    Number of Children: N/A   Years of Education: N/A   Occupational History   Not on file.   Social History Main Topics   Smoking status: Never Smoker    Smokeless tobacco: Not on file   Alcohol Use: No   Drug Use: No   Sexual Activity: Not on file   Other Topics Concern   Not on file   Social History Narrative   Married;   College  professor   No Known Allergies  Review of Systems  HENT: Negative for mouth sores.   Eyes: Negative for visual disturbance.  Musculoskeletal: Positive for arthralgias.  Skin: Negative for rash.  Neurological: Positive for headaches.     Objective:  Physical Exam  Constitutional: He is oriented to person, place, and time. He appears well-developed and well-nourished.  HENT:  Head: Normocephalic and atraumatic.  Eyes: EOM are normal.  Neck: Normal range of motion.  Cardiovascular: Normal rate.   Pulmonary/Chest: Effort normal.  Musculoskeletal: Normal range of motion. He exhibits tenderness.  Tender along the left, medial calcaneus.   Neurological: He is alert and oriented to person, place, and time.  Skin: Skin is warm and dry.  Psychiatric: He has a normal mood and affect. His behavior is normal.  Nursing note and vitals reviewed.   BP 130/68 mmHg   Pulse 78   Temp(Src) 98.5 F (36.9 C) (Oral)   Resp 16   Ht _0  (1.651 m)   Wt 161 lb 9.6 oz (73.301 kg)   BMI 26.89 kg/m2   SpO2 99% Assessment & Plan:  This chart was scribed in my presence and reviewed by me personally.  Plantar fasciitis of left foot - Plan:  predniSONE (DELTASONE) 20 MG tablet  Signed, Robyn Haber, MD

## 2014-06-09 NOTE — Patient Instructions (Signed)
Plantar Fasciitis Plantar fasciitis is a common condition that causes foot pain. It is soreness (inflammation) of the band of tough fibrous tissue on the bottom of the foot that runs from the heel bone (calcaneus) to the ball of the foot. The cause of this soreness may be from excessive standing, poor fitting shoes, running on hard surfaces, being overweight, having an abnormal walk, or overuse (this is common in runners) of the painful foot or feet. It is also common in aerobic exercise dancers and ballet dancers. SYMPTOMS  Most people with plantar fasciitis complain of:  Severe pain in the morning on the bottom of their foot especially when taking the first steps out of bed. This pain recedes after a few minutes of walking.  Severe pain is experienced also during walking following a long period of inactivity.  Pain is worse when walking barefoot or up stairs DIAGNOSIS   Your caregiver will diagnose this condition by examining and feeling your foot.  Special tests such as X-rays of your foot, are usually not needed. PREVENTION   Consult a sports medicine professional before beginning a new exercise program.  Walking programs offer a good workout. With walking there is a lower chance of overuse injuries common to runners. There is less impact and less jarring of the joints.  Begin all new exercise programs slowly. If problems or pain develop, decrease the amount of time or distance until you are at a comfortable level.  Wear good shoes and replace them regularly.  Stretch your foot and the heel cords at the back of the ankle (Achilles tendon) both before and after exercise.  Run or exercise on even surfaces that are not hard. For example, asphalt is better than pavement.  Do not run barefoot on hard surfaces.  If using a treadmill, vary the incline.  Do not continue to workout if you have foot or joint problems. Seek professional help if they do not improve. HOME CARE INSTRUCTIONS     Avoid activities that cause you pain until you recover.  Use ice or cold packs on the problem or painful areas after working out.  Only take over-the-counter or prescription medicines for pain, discomfort, or fever as directed by your caregiver.  Soft shoe inserts or athletic shoes with air or gel sole cushions may be helpful.  If problems continue or become more severe, consult a sports medicine caregiver or your own health care provider. Cortisone is a potent anti-inflammatory medication that may be injected into the painful area. You can discuss this treatment with your caregiver. MAKE SURE YOU:   Understand these instructions.  Will watch your condition.  Will get help right away if you are not doing well or get worse. Document Released: 03/19/2001 Document Revised: 09/16/2011 Document Reviewed: 05/18/2008 [facility] Patient Information 2015 [place], [place]. This information is not intended to replace advice given to you by your health care provider. Make sure you discuss any questions you have with your health care provider.   Results for orders placed or performed in visit on 01/20/14  Vitamin D, 25-hydroxy  Result Value Ref Range   Vit D, 25-Hydroxy 55 30 - 89 ng/mL  PSA  Result Value Ref Range   PSA 0.69 <=4.00 ng/mL  Lipid panel  Result Value Ref Range   Cholesterol 169 0 - 200 mg/dL   Triglycerides 122 <150 mg/dL   HDL 35 (L) >39 mg/dL   Total CHOL/HDL Ratio 4.8 Ratio   VLDL 24 0 - 40 mg/dL  LDL Cholesterol 110 (H) 0 - 99 mg/dL  COMPLETE METABOLIC PANEL WITH GFR  Result Value Ref Range   Sodium 139 135 - 145 mEq/L   Potassium 3.8 3.5 - 5.3 mEq/L   Chloride 106 96 - 112 mEq/L   CO2 23 19 - 32 mEq/L   Glucose, Bld 95 70 - 99 mg/dL   BUN 15 6 - 23 mg/dL   Creat 1.22 0.50 - 1.35 mg/dL   Total Bilirubin 0.9 0.2 - 1.2 mg/dL   Alkaline Phosphatase 41 39 - 117 U/L   AST 25 0 - 37 U/L   ALT 36 0 - 53 U/L   Total Protein 6.5 6.0 - 8.3 g/dL   Albumin 4.0 3.5 - 5.2  g/dL   Calcium 8.8 8.4 - 10.5 mg/dL   GFR, Est African American 74 mL/min   GFR, Est Non African American 64 mL/min  CBC with Differential  Result Value Ref Range   WBC 4.0 4.0 - 10.5 K/uL   RBC 5.42 4.22 - 5.81 MIL/uL   Hemoglobin 15.0 13.0 - 17.0 g/dL   HCT 45.3 39.0 - 52.0 %   MCV 83.6 78.0 - 100.0 fL   MCH 27.7 26.0 - 34.0 pg   MCHC 33.1 30.0 - 36.0 g/dL   RDW 14.8 11.5 - 15.5 %   Platelets 152 150 - 400 K/uL   Neutrophils Relative % 51 43 - 77 %   Neutro Abs 2.0 1.7 - 7.7 K/uL   Lymphocytes Relative 34 12 - 46 %   Lymphs Abs 1.4 0.7 - 4.0 K/uL   Monocytes Relative 11 3 - 12 %   Monocytes Absolute 0.4 0.1 - 1.0 K/uL   Eosinophils Relative 3 0 - 5 %   Eosinophils Absolute 0.1 0.0 - 0.7 K/uL   Basophils Relative 1 0 - 1 %   Basophils Absolute 0.0 0.0 - 0.1 K/uL   Smear Review Criteria for review not met   IFOBT POC (occult bld, rslt in office)  Result Value Ref Range   IFOBT Negative   POCT urinalysis dipstick  Result Value Ref Range   Color, UA YELLOW    Clarity, UA CLEAR    Glucose, UA NEG    Bilirubin, UA NEG    Ketones, UA NEG    Spec Grav, UA <=1.005    Blood, UA NEG    pH, UA 6.5    Protein, UA NEG    Urobilinogen, UA 0.2    Nitrite, UA NEG    Leukocytes, UA Negative

## 2014-06-24 ENCOUNTER — Other Ambulatory Visit: Payer: Self-pay | Admitting: Family Medicine

## 2014-06-27 NOTE — Telephone Encounter (Signed)
Dr L, do you want to give RFs?

## 2014-08-11 ENCOUNTER — Other Ambulatory Visit: Payer: Self-pay | Admitting: Family Medicine

## 2014-08-12 NOTE — Telephone Encounter (Signed)
Lm message to call back.   Prescription for Prendisone, need to know if he is still having symptoms with his plantar faciitis,     Will send message to Dr. Federico Flake about prescription after talking to patient.

## 2014-08-16 NOTE — Telephone Encounter (Signed)
Wife called back and thought we had left message for her, but she agreed to ask pt if he has req'd a RF of prednisone, and if so, will have him CB to update [place] on status details.

## 2014-09-08 ENCOUNTER — Ambulatory Visit (INDEPENDENT_AMBULATORY_CARE_PROVIDER_SITE_OTHER): Payer: BLUE CROSS/BLUE SHIELD | Admitting: Family Medicine

## 2014-09-08 ENCOUNTER — Encounter: Payer: Self-pay | Admitting: Family Medicine

## 2014-09-08 VITALS — BP 110/62 | HR 90 | Temp 97.8°F | Resp 16 | Ht 65.0 in | Wt 167.0 lb

## 2014-09-08 DIAGNOSIS — M722 Plantar fascial fibromatosis: Secondary | ICD-10-CM

## 2014-09-08 MED ORDER — PREDNISONE 20 MG PO TABS: 40.0000 mg | ORAL_TABLET | Freq: Every day | ORAL | Status: DC

## 2014-09-08 NOTE — Progress Notes (Signed)
° °  Subjective:  This chart was scribed for Robyn Haber, MD by Mercy Moore, Medial Scribe. This patient was seen in room 24 and the patient's care was started at 4:03 PM.    Patient ID: Earl Thomas, male    DOB: [birth year], 64 y.o.   MRN: [id number]  Chief Complaint  Patient presents with   Plantar Fasciitis    LEFT foot since 03/2014    HPI HPI Comments: Earl Thomas is a 64 y.o. male with PMHx Hyperlipidemia who presents to the [facility] complaining of unresolved plantar fascitis. Patient states that he was taking Prednisone, but stopped for a few weeks and noticed his pain began returning. Patient reports that his pain is worse in the morning and after periods of inactivity. Patient declines injection in his foot preferring to take the Prednisone.   Patient is a Automotive engineer.   Patient Active Problem List   Diagnosis Date Noted   Hyperlipidemia 03/10/2012   Past Medical History  Diagnosis Date   Hyperlipidemia    No past surgical history on file. No Known Allergies Prior to Admission medications   Medication Sig Start Date End Date Taking? Authorizing Provider  aspirin 81 MG tablet Take 81 mg by mouth daily.    Historical Provider, MD  cholecalciferol (VITAMIN D) 1000 UNITS tablet Take 1,000 Units by mouth daily.    Historical Provider, MD  pravastatin (PRAVACHOL) 40 MG tablet Take 1 tablet (40 mg total) by mouth daily. 02/10/14   Robyn Haber, MD  predniSONE (DELTASONE) 20 MG tablet Take 2 tablets (40 mg total) by mouth daily. 06/09/14   Robyn Haber, MD  PROCTOSOL HC 2.5 % rectal cream APPLY ONE APPLICATORFUL RECTALLY TWICE DAILY 06/27/14   Robyn Haber, MD  vitamin E 400 UNIT capsule Take 400 Units by mouth daily.    Historical Provider, MD   History   Social History   Marital Status: Married    Spouse Name: N/A   Number of Children: N/A   Years of Education: N/A   Occupational History   Not on file.   Social History  Main Topics   Smoking status: Never Smoker    Smokeless tobacco: Not on file   Alcohol Use: No   Drug Use: No   Sexual Activity: Not on file   Other Topics Concern   Not on file   Social History Narrative   Married;   College professor    Review of Systems  Constitutional: Negative for fever and chills.  Musculoskeletal:       Plantar foot pain  Neurological: Negative for numbness.       Objective:   Physical Exam  Constitutional: He appears well-developed. No distress.  Cardiovascular: Normal rate.   Pulmonary/Chest: Effort normal. No respiratory distress.  Musculoskeletal:  Tender to medial left heel. Normal inspection. Normal ROM.   Skin: No erythema.  Nursing note and vitals reviewed.    Filed Vitals:   09/08/14 1600  BP: 110/62  Pulse: 90  Temp: 97.8 F (36.6 C)  TempSrc: Oral  Resp: 16  Height: _0  (1.651 m)  Weight: 167 lb (75.751 kg)  SpO2: 97%        Assessment & Plan:   This chart was scribed in my presence and reviewed by me personally.    ICD-9-CM ICD-10-CM   1. Plantar fasciitis of left foot 728.71 M72.2 predniSONE (DELTASONE) 20 MG tablet     Signed, Robyn Haber, MD

## 2014-09-23 ENCOUNTER — Other Ambulatory Visit: Payer: Self-pay | Admitting: Family Medicine

## 2014-11-01 ENCOUNTER — Other Ambulatory Visit: Payer: Self-pay | Admitting: Physician Assistant

## 2014-12-13 ENCOUNTER — Other Ambulatory Visit: Payer: Self-pay | Admitting: Family Medicine

## 2014-12-14 ENCOUNTER — Ambulatory Visit (INDEPENDENT_AMBULATORY_CARE_PROVIDER_SITE_OTHER): Payer: BLUE CROSS/BLUE SHIELD | Admitting: Family Medicine

## 2014-12-14 VITALS — BP 118/70 | HR 99 | Temp 99.2°F | Resp 17 | Ht 65.0 in | Wt 168.4 lb

## 2014-12-14 DIAGNOSIS — R05 Cough: Secondary | ICD-10-CM

## 2014-12-14 DIAGNOSIS — R059 Cough, unspecified: Secondary | ICD-10-CM

## 2014-12-14 DIAGNOSIS — J22 Unspecified acute lower respiratory infection: Secondary | ICD-10-CM

## 2014-12-14 DIAGNOSIS — J988 Other specified respiratory disorders: Secondary | ICD-10-CM | POA: Diagnosis not present

## 2014-12-14 MED ORDER — AZITHROMYCIN 250 MG PO TABS: ORAL_TABLET | ORAL | Status: DC

## 2014-12-14 MED ORDER — BENZONATATE 100 MG PO CAPS: 100.0000 mg | ORAL_CAPSULE | Freq: Three times a day (TID) | ORAL | Status: DC | PRN

## 2014-12-14 NOTE — Patient Instructions (Signed)
Over the counter mucinex or mucinex DM for cough, OR can try the tessalon perles up tot three times per day, drink plenty of fluids. Can start antibiotic tonight, or tomorrow if not improving. Return to the clinic or go to the nearest emergency room if any of your symptoms worsen or new symptoms occur.   Cough, Adult  A cough is a reflex that helps clear your throat and airways. It can help heal the body or may be a reaction to an irritated airway. A cough may only last 2 or 3 weeks (acute) or may last more than 8 weeks (chronic).  CAUSES Acute cough:  Viral or bacterial infections. Chronic cough:  Infections.  Allergies.  Asthma.  Post-nasal drip.  Smoking.  Heartburn or acid reflux.  Some medicines.  Chronic lung problems (COPD).  Cancer. SYMPTOMS   Cough.  Fever.  Chest pain.  Increased breathing rate.  High-pitched whistling sound when breathing (wheezing).  Colored mucus that you cough up (sputum). TREATMENT   A bacterial cough may be treated with antibiotic medicine.  A viral cough must run its course and will not respond to antibiotics.  Your caregiver may recommend other treatments if you have a chronic cough. HOME CARE INSTRUCTIONS   Only take over-the-counter or prescription medicines for pain, discomfort, or fever as directed by your caregiver. Use cough suppressants only as directed by your caregiver.  Use a cold steam vaporizer or humidifier in your bedroom or home to help loosen secretions.  Sleep in a semi-upright position if your cough is worse at night.  Rest as needed.  Stop smoking if you smoke. SEEK IMMEDIATE MEDICAL CARE IF:   You have pus in your sputum.  Your cough starts to worsen.  You cannot control your cough with suppressants and are losing sleep.  You begin coughing up blood.  You have difficulty breathing.  You develop pain which is getting worse or is uncontrolled with medicine.  You have a fever. MAKE SURE YOU:     Understand these instructions.  Will watch your condition.  Will get help right away if you are not doing well or get worse. Document Released: 12/21/2010 Document Revised: 09/16/2011 Document Reviewed: 12/21/2010 [facility] Patient Information 2015 [place], [place]. This information is not intended to replace advice given to you by your health care provider. Make sure you discuss any questions you have with your health care provider.

## 2014-12-14 NOTE — Progress Notes (Signed)
Subjective:    Patient ID: Earl Thomas, male    DOB: [birth year], 64 y.o.   MRN: [id number] This chart was scribed for Earl Ray, MD by Zola Button, Medical Scribe. This patient was seen in Room 2 and the patient's care was started at 6:15 PM.    HPI HPI Comments: Earl Thomas is a 64 y.o. male who presents to the [facility] complaining of gradual onset URI symptoms that started 2 weeks ago. His symptoms started with occasionally productive cough of yellow sputum, rhinorrhea, and nasal congestion. After about a week, he notes that his symptoms moved to his chest, with symptoms including chest congestion. His symptoms have since resolved except for the persistent cough, which has been improving and is no longer productive. The cough is worse at night, but it does not keep him awake up night. Patient has been taking Sudafed and Mucinex for his symptoms. He denies fever and SOB. His wife has been having similar symptoms, but he was the first to have symptoms.  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   Patient Active Problem List   Diagnosis Date Noted  . Hyperlipidemia 03/10/2012   Past Medical History  Diagnosis Date  . Hyperlipidemia    History reviewed. No pertinent past surgical history. No Known Allergies Prior to Admission medications   Medication Sig Start Date End Date Taking? Authorizing Provider  aspirin 81 MG tablet Take 81 mg by mouth daily.   Yes Historical Provider, MD  pravastatin (PRAVACHOL) 40 MG tablet TAKE ONE TABLET BY MOUTH ONCE DAILY. 12/14/14  Yes Chelle Jeffery, PA-C  PROCTOSOL HC 2.5 % rectal cream APPLY ONE APPLICATORFUL RECTALLY TWICE DAILY 06/27/14  Yes Robyn Haber, MD  cholecalciferol (VITAMIN D) 1000 UNITS tablet Take 1,000 Units by mouth daily.    Historical Provider, MD  predniSONE (DELTASONE) 20 MG tablet Take 2 tablets (40 mg total) by mouth daily. Patient not taking: Reported on 12/14/2014 09/08/14   Robyn Haber, MD  vitamin E 400 UNIT capsule Take 400 Units by mouth daily.    Historical Provider, MD   History   Social History  . Marital Status: Married    Spouse Name: N/A  . Number of Children: N/A  . Years of Education: N/A   Occupational History  . Not on file.   Social History Main Topics  . Smoking status: Never Smoker   . Smokeless tobacco: Not on file  . Alcohol Use: No  . Drug Use: No  . Sexual Activity: Not on file   Other Topics Concern  . Not on file   Social History Narrative   Married;   College professor     Review of Systems  Constitutional: Negative for fever.  HENT: Negative for congestion and rhinorrhea.   Respiratory: Positive for cough. Negative for shortness of breath.        Objective:   Physical Exam  Constitutional: He is oriented to person, place, and time. He appears well-developed and well-nourished.  HENT:  Head: Normocephalic and atraumatic.  Right Ear: Tympanic membrane, external ear and ear canal normal.  Left Ear: Tympanic membrane, external ear and ear canal normal.  Nose: No rhinorrhea.  Mouth/Throat: Oropharynx is clear and moist and mucous membranes are normal. No oropharyngeal exudate or posterior oropharyngeal erythema.  Eyes: Conjunctivae are normal. Pupils are equal, round, and reactive to light.  Neck: Neck supple.  Cardiovascular: Normal rate, regular rhythm, normal heart sounds and intact  distal pulses.   No murmur heard. Pulmonary/Chest: Effort normal. He has no wheezes. He has no rhonchi. He has no rales.  Very faint coarse breath sounds in the lower left lobe, but overall clear.  Abdominal: Soft. There is no tenderness.  Lymphadenopathy:    He has no cervical adenopathy.  Neurological: He is alert and oriented to person, place, and time.  Skin: Skin is warm and dry. No rash noted.  Psychiatric: He has a normal mood and affect. His behavior is normal.  Vitals reviewed.     Filed Vitals:   12/14/14 1620    BP: 118/70  Pulse: 99  Temp: 99.2 F (37.3 C)  TempSrc: Oral  Resp: 17  Height: _0  (1.651 m)  Weight: 168 lb 6.4 oz (76.386 kg)  SpO2: 97%       Assessment & Plan:   [name] is a 64 y.o. male Cough - Plan: benzonatate (TESSALON) 100 MG capsule  LRTI (lower respiratory tract infection) - Plan: azithromycin (ZITHROMAX) 250 MG tablet  Bronchitis or mild CAP. Slight improvement, but possible LLL rhonchi.  Afebrile, reassuring O2 sat and exam. Sx care with tessalon or mucine and start Zpak if not improved into next day. Rtc/er precautions discussed.   Meds ordered this encounter  Medications  . azithromycin (ZITHROMAX) 250 MG tablet    Sig: Take 2 pills by mouth on day 1, then 1 pill by mouth per day on days 2 through 5.    Dispense:  6 tablet    Refill:  0  . benzonatate (TESSALON) 100 MG capsule    Sig: Take 1 capsule (100 mg total) by mouth 3 (three) times daily as needed for cough.    Dispense:  20 capsule    Refill:  0   Patient Instructions  Over the counter mucinex or mucinex DM for cough, OR can try the tessalon perles up tot three times per day, drink plenty of fluids. Can start antibiotic tonight, or tomorrow if not improving. Return to the clinic or go to the nearest emergency room if any of your symptoms worsen or new symptoms occur.   Cough, Adult  A cough is a reflex that helps clear your throat and airways. It can help heal the body or may be a reaction to an irritated airway. A cough may only last 2 or 3 weeks (acute) or may last more than 8 weeks (chronic).  CAUSES Acute cough:  Viral or bacterial infections. Chronic cough:  Infections.  Allergies.  Asthma.  Post-nasal drip.  Smoking.  Heartburn or acid reflux.  Some medicines.  Chronic lung problems (COPD).  Cancer. SYMPTOMS   Cough.  Fever.  Chest pain.  Increased breathing rate.  High-pitched whistling sound when breathing (wheezing).  Colored mucus that you cough up  (sputum). TREATMENT   A bacterial cough may be treated with antibiotic medicine.  A viral cough must run its course and will not respond to antibiotics.  Your caregiver may recommend other treatments if you have a chronic cough. HOME CARE INSTRUCTIONS   Only take over-the-counter or prescription medicines for pain, discomfort, or fever as directed by your caregiver. Use cough suppressants only as directed by your caregiver.  Use a cold steam vaporizer or humidifier in your bedroom or home to help loosen secretions.  Sleep in a semi-upright position if your cough is worse at night.  Rest as needed.  Stop smoking if you smoke. SEEK IMMEDIATE MEDICAL CARE IF:   You have pus  in your sputum.  Your cough starts to worsen.  You cannot control your cough with suppressants and are losing sleep.  You begin coughing up blood.  You have difficulty breathing.  You develop pain which is getting worse or is uncontrolled with medicine.  You have a fever. MAKE SURE YOU:   Understand these instructions.  Will watch your condition.  Will get help right away if you are not doing well or get worse. Document Released: 12/21/2010 Document Revised: 09/16/2011 Document Reviewed: 12/21/2010 [facility] Patient Information 2015 [place], [place]. This information is not intended to replace advice given to you by your health care provider. Make sure you discuss any questions you have with your health care provider.     I personally performed the services described in this documentation, which was scribed in my presence. The recorded information has been reviewed and considered, and addended by me as needed.

## 2015-01-19 ENCOUNTER — Other Ambulatory Visit: Payer: Self-pay | Admitting: Physician Assistant

## 2015-02-22 ENCOUNTER — Encounter: Payer: Self-pay | Admitting: Family Medicine

## 2015-02-22 ENCOUNTER — Ambulatory Visit (INDEPENDENT_AMBULATORY_CARE_PROVIDER_SITE_OTHER): Payer: BLUE CROSS/BLUE SHIELD | Admitting: Family Medicine

## 2015-02-22 ENCOUNTER — Ambulatory Visit (INDEPENDENT_AMBULATORY_CARE_PROVIDER_SITE_OTHER): Payer: BLUE CROSS/BLUE SHIELD

## 2015-02-22 VITALS — BP 120/60 | HR 75 | Temp 98.3°F | Resp 16 | Ht 65.0 in | Wt 168.0 lb

## 2015-02-22 DIAGNOSIS — Z131 Encounter for screening for diabetes mellitus: Secondary | ICD-10-CM

## 2015-02-22 DIAGNOSIS — Z0000 Encounter for general adult medical examination without abnormal findings: Secondary | ICD-10-CM | POA: Diagnosis not present

## 2015-02-22 DIAGNOSIS — E785 Hyperlipidemia, unspecified: Secondary | ICD-10-CM

## 2015-02-22 DIAGNOSIS — N521 Erectile dysfunction due to diseases classified elsewhere: Secondary | ICD-10-CM

## 2015-02-22 DIAGNOSIS — Z125 Encounter for screening for malignant neoplasm of prostate: Secondary | ICD-10-CM

## 2015-02-22 DIAGNOSIS — Z1159 Encounter for screening for other viral diseases: Secondary | ICD-10-CM | POA: Diagnosis not present

## 2015-02-22 DIAGNOSIS — R2232 Localized swelling, mass and lump, left upper limb: Secondary | ICD-10-CM

## 2015-02-22 LAB — POCT URINALYSIS DIPSTICK
Bilirubin, UA: NEGATIVE
Blood, UA: NEGATIVE
Glucose, UA: NEGATIVE
Ketones, UA: NEGATIVE
Leukocytes, UA: NEGATIVE
Nitrite, UA: NEGATIVE
Protein, UA: NEGATIVE
Spec Grav, UA: 1.025
Urobilinogen, UA: 0.2
pH, UA: 6

## 2015-02-22 LAB — CBC WITH DIFFERENTIAL/PLATELET
Basophils Absolute: 0 10*3/uL (ref 0.0–0.1)
Basophils Relative: 1 % (ref 0–1)
Eosinophils Absolute: 0.2 10*3/uL (ref 0.0–0.7)
Eosinophils Relative: 4 % (ref 0–5)
HCT: 46.6 % (ref 39.0–52.0)
Hemoglobin: 15 g/dL (ref 13.0–17.0)
Lymphocytes Relative: 39 % (ref 12–46)
Lymphs Abs: 1.6 10*3/uL (ref 0.7–4.0)
MCH: 27.8 pg (ref 26.0–34.0)
MCHC: 32.2 g/dL (ref 30.0–36.0)
MCV: 86.3 fL (ref 78.0–100.0)
MPV: 10.6 fL (ref 8.6–12.4)
Monocytes Absolute: 0.5 10*3/uL (ref 0.1–1.0)
Monocytes Relative: 11 % (ref 3–12)
Neutro Abs: 1.8 10*3/uL (ref 1.7–7.7)
Neutrophils Relative %: 45 % (ref 43–77)
Platelets: 180 10*3/uL (ref 150–400)
RBC: 5.4 MIL/uL (ref 4.22–5.81)
RDW: 14.4 % (ref 11.5–15.5)
WBC: 4.1 10*3/uL (ref 4.0–10.5)

## 2015-02-22 MED ORDER — PRAVASTATIN SODIUM 40 MG PO TABS: ORAL_TABLET | ORAL | Status: DC

## 2015-02-22 MED ORDER — MELOXICAM 7.5 MG PO TABS: 7.5000 mg | ORAL_TABLET | Freq: Every day | ORAL | Status: DC

## 2015-02-22 NOTE — Progress Notes (Signed)
Subjective:    Patient ID: Earl Thomas, male    DOB: [birth year], 64 y.o.   MRN: [id number]  HPI    Review of Systems  Constitutional: Negative.   HENT: Positive for sneezing.   Eyes: Negative.   Respiratory: Negative.   Cardiovascular: Negative.   Gastrointestinal: Positive for blood in stool.  Endocrine: Negative.   Genitourinary: Negative.   Musculoskeletal: Positive for back pain and arthralgias.  Skin: Negative.   Allergic/Immunologic: Negative.   Neurological: Negative.   Hematological: Negative.   Psychiatric/Behavioral: Negative.        Objective:   Physical Exam        Assessment & Plan:

## 2015-02-22 NOTE — Progress Notes (Signed)
Subjective:    Patient ID: Earl Thomas, male    DOB: Jul 10, 1950, 64 y.o.   MRN: [id number]  02/22/2015  Annual Exam and Medication Refill   HPI This 64 y.o. male presents for Complete Physical Examination.   Last physical: 01-2014 [facility] [facility]: hemosure negative 2015; five years ago; Dr. Benson Norway.  Met with him Monday; scheduled 03/09/2015.  Blood in stool recently.  +colon polyps.   TDAP: 2010 Pneumovax: never Zostavax: 2015 Influenza: 2015 Eye exam:  March 2016; Dr. Terance Hart on [facility].  No g/c.  Glasses. Dental exam:  Every six months.  L hankd swelling: second to fourth MCP joints; swelling.  Ongoing for past year. No family history of rheumatoid arthritis.    Plantar fasciitis L: ongoing.  Intermittent pain.  Meloxicam PRN.    Review of Systems  Constitutional: Negative for fever, chills, diaphoresis, activity change, appetite change, fatigue and unexpected weight change.  HENT: Negative for congestion, dental problem, drooling, ear discharge, ear pain, facial swelling, hearing loss, mouth sores, nosebleeds, postnasal drip, rhinorrhea, sinus pressure, sneezing, sore throat, tinnitus, trouble swallowing and voice change.   Eyes: Negative for photophobia, pain, discharge, redness, itching and visual disturbance.  Respiratory: Negative for apnea, cough, choking, chest tightness, shortness of breath, wheezing and stridor.   Cardiovascular: Negative for chest pain, palpitations and leg swelling.  Gastrointestinal: Positive for blood in stool. Negative for nausea, vomiting, abdominal pain, diarrhea, constipation, abdominal distention and anal bleeding.  Endocrine: Negative for cold intolerance, heat intolerance, polydipsia, polyphagia and polyuria.  Genitourinary: Negative for dysuria, urgency, frequency, hematuria, flank pain, decreased urine volume, discharge, penile swelling, scrotal swelling, enuresis, difficulty urinating, genital sores, penile pain and testicular pain.    Musculoskeletal: Positive for joint swelling and arthralgias. Negative for myalgias, back pain, gait problem, neck pain and neck stiffness.  Skin: Negative for color change, pallor, rash and wound.  Allergic/Immunologic: Negative for environmental allergies, food allergies and immunocompromised state.  Neurological: Negative for dizziness, tremors, seizures, syncope, facial asymmetry, speech difficulty, weakness, light-headedness, numbness and headaches.  Hematological: Negative for adenopathy. Does not bruise/bleed easily.  Psychiatric/Behavioral: Negative for suicidal ideas, hallucinations, behavioral problems, confusion, sleep disturbance, self-injury, dysphoric mood, decreased concentration and agitation. The patient is not nervous/anxious and is not hyperactive.     Past Medical History  Diagnosis Date  . Hyperlipidemia   . 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   History reviewed. No pertinent past surgical history. No Known Allergies  Social History   Social History  . Marital Status: Married    Spouse Name: N/A  . Number of Children: N/A  . Years of Education: N/A   Occupational History  . Professor    Social History Main Topics  . Smoking status: Never Smoker   . Smokeless tobacco: Not on file  . Alcohol Use: No  . Drug Use: No  . Sexual Activity: Not on file   Other Topics Concern  . 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      Tobacco: pipe in 1980s      Alcohol: none      Exercise: joined [employer]; going 2-3 times per week.  Seatbelt:  100%   Family History  Problem Relation Age of Onset  . Cancer Father 74    prostate cancer  . Diabetes  Father   . Heart disease Father 59    AMI late 90s  . Cancer Mother 40    Breast cancer  . Heart disease Mother     CABG at age 94  . Diabetes Brother   . Heart disease Brother     AMI x 2  . Hyperlipidemia Brother         Objective:    BP 120/60 mmHg  Pulse 75  Temp(Src) 98.3 F (36.8 C) (Oral)  Resp 16  Ht _0  (1.651 m)  Wt 168 lb (76.204 kg)  BMI 27.96 kg/m2  SpO2 98% Physical Exam  Constitutional: He is oriented to person, place, and time. He appears well-developed and well-nourished. No distress.  HENT:  Head: Normocephalic and atraumatic.  Right Ear: External ear normal.  Left Ear: External ear normal.  Nose: Nose normal.  Mouth/Throat: Oropharynx is clear and moist.  Eyes: Conjunctivae and EOM are normal. Pupils are equal, round, and reactive to light.  Neck: Normal range of motion. Neck supple. Carotid bruit is not present. No thyromegaly present.  Cardiovascular: Normal rate, regular rhythm, normal heart sounds and intact distal pulses.  Exam reveals no gallop and no friction rub.   No murmur heard. Pulmonary/Chest: Effort normal and breath sounds normal. He has no wheezes. He has no rales.  Abdominal: Soft. Bowel sounds are normal. He exhibits no distension and no mass. There is no tenderness. There is no rebound and no guarding. Hernia confirmed negative in the right inguinal area and confirmed negative in the left inguinal area.  Genitourinary: Rectum normal, testes normal and penis normal. Prostate is enlarged. Circumcised.  Musculoskeletal:       Right shoulder: Normal.       Left shoulder: Normal.       Cervical back: Normal.  +prominence of MCP joints L 2nd through 4th digits; no associated warmth or tenderness.  Lymphadenopathy:    He has no cervical adenopathy.       Right: No inguinal adenopathy present.       Left: No inguinal adenopathy present.  Neurological: He is alert and oriented to person, place, and time. He has normal reflexes. No  cranial nerve deficit. He exhibits normal muscle tone. Coordination normal.  Skin: Skin is warm and dry. No rash noted. He is not diaphoretic.  Psychiatric: He has a normal mood and affect. His behavior is normal. Judgment and thought content normal.    [facility] reading (PRIMARY) by  Dr. Tamala Julian. L HAND: degenerative changes mild MCP joints. No acute process.        Assessment & Plan:   1. Routine physical examination   2. Hyperlipidemia   3. Erectile dysfunction due to diseases classified elsewhere   4. Encounter for prostate cancer screening   5. Screening for diabetes mellitus   6. Localized swelling on left hand   7. Need for hepatitis C screening test     1. Complete PHysical eXamination: anticipatory guidance --- exercise, weight loss, ASA 55m daily.  Scheduled for colonoscopy in upcoming month.  Immunizations UTD.   2.  Hyperlipidemia; stable; obtain labs; refills provided. 3.  ED: stable; improved from last year. 4.  Prostate cancer screening: s/p DRE; obtain PSA. 5.  Screening DMII: obtain glucose, HgbA1c. 6.  L hand swelling: New. Consistent with OA.  Rx for Meloxicam  provided to use PRN. 7.  Need for hepatitis C screening: obtain Hepatitis C ab.   Meds ordered this encounter  Medications  . aspirin 325 MG tablet    Sig: Take 325 mg by mouth daily.  . pravastatin (PRAVACHOL) 40 MG tablet    Sig: TAKE ONE TABLET BY MOUTH ONCE DAILY.    Dispense:  90 tablet    Refill:  3  . meloxicam (MOBIC) 7.5 MG tablet    Sig: Take 1 tablet (7.5 mg total) by mouth daily.    Dispense:  30 tablet    Refill:  5  . atorvastatin (LIPITOR) 10 MG tablet    Sig: Take 1 tablet (10 mg total) by mouth daily at 6 PM.    Dispense:  90 tablet    Refill:  3    Return in about 1 year (around 02/22/2016) for complete physical examiniation.     Iona Stay Elayne Guerin, M.D. [facility] [address] [place],   [address] [phone] phone [phone]  fax

## 2015-02-22 NOTE — Patient Instructions (Signed)
Keeping you healthy  Get these tests  Blood pressure- Have your blood pressure checked once a year by your healthcare provider.  Normal blood pressure is 120/80  Weight- Have your body mass index (BMI) calculated to screen for obesity.  BMI is a measure of body fat based on height and weight. You can also calculate your own BMI at [email or web address].[email or web address].  Cholesterol- Have your cholesterol checked every year.  Diabetes- Have your blood sugar checked regularly if you have high blood pressure, high cholesterol, have a family history of diabetes or if you are overweight.  Screening for Colon Cancer- Colonoscopy starting at age 57.  Screening may begin sooner depending on your family history and other health conditions. Follow up colonoscopy as directed by your Gastroenterologist.  Screening for Prostate Cancer- Both blood work (PSA) and a rectal exam help screen for Prostate Cancer.  Screening begins at age 18 with African-American men and at age 69 with Caucasian men.  Screening may begin sooner depending on your family history.  Take these medicines  Aspirin- One aspirin daily can help prevent Heart disease and Stroke.  Flu shot- Every fall.  Tetanus- Every 10 years.  Zostavax- Once after the age of 51 to prevent Shingles.  Pneumonia shot- Once after the age of 77; if you are younger than 42, ask your healthcare provider if you need a Pneumonia shot.  Take these steps  Don't smoke- If you do smoke, talk to your doctor about quitting.  For tips on how to quit, go to www.smokefree.gov or call 1-800-QUIT-NOW.  Be physically active- Exercise 5 days a week for at least 30 minutes.  If you are not already physically active start slow and gradually work up to 30 minutes of moderate physical activity.  Examples of moderate activity include walking briskly, mowing the yard, dancing, swimming, bicycling, etc.  Eat a healthy diet- Eat a variety of healthy food such as fruits, vegetables, low  fat milk, low fat cheese, yogurt, lean meant, poultry, fish, beans, tofu, etc. For more information go to www.thenutritionsource.org  Drink alcohol in moderation- Limit alcohol intake to less than two drinks a day. Never drink and drive.  Dentist- Brush and floss twice daily; visit your dentist twice a year.  Depression- Your emotional health is as important as your physical health. If you're feeling down, or losing interest in things you would normally enjoy please talk to your healthcare provider.  Eye exam- Visit your eye doctor every year.  Safe sex- If you may be exposed to a sexually transmitted infection, use a condom.  Seat belts- Seat belts can save your life; always wear one.  Smoke/Carbon Monoxide detectors- These detectors need to be installed on the appropriate level of your home.  Replace batteries at least once a year.  Skin cancer- When out in the sun, cover up and use sunscreen 15 SPF or higher.  Violence- If anyone is threatening you, please tell your healthcare provider.  Living Will/ Health care power of attorney- Speak with your healthcare provider and family.

## 2015-02-23 ENCOUNTER — Encounter: Payer: Self-pay | Admitting: *Deleted

## 2015-02-23 LAB — TSH: TSH: 2.399 u[IU]/mL (ref 0.350–4.500)

## 2015-02-23 LAB — HEMOGLOBIN A1C
Hgb A1c MFr Bld: 6.1 % — ABNORMAL HIGH (ref <5.7)
Mean Plasma Glucose: 128 mg/dL — ABNORMAL HIGH (ref <117)

## 2015-02-23 LAB — COMPREHENSIVE METABOLIC PANEL
ALT: 21 U/L (ref 9–46)
AST: 18 U/L (ref 10–35)
Albumin: 4.2 g/dL (ref 3.6–5.1)
Alkaline Phosphatase: 40 U/L (ref 40–115)
BUN: 12 mg/dL (ref 7–25)
CO2: 27 mmol/L (ref 20–31)
Calcium: 9 mg/dL (ref 8.6–10.3)
Chloride: 103 mmol/L (ref 98–110)
Creat: 1.23 mg/dL (ref 0.70–1.25)
Glucose, Bld: 106 mg/dL — ABNORMAL HIGH (ref 65–99)
Potassium: 4.2 mmol/L (ref 3.5–5.3)
Sodium: 139 mmol/L (ref 135–146)
Total Bilirubin: 0.8 mg/dL (ref 0.2–1.2)
Total Protein: 6.4 g/dL (ref 6.1–8.1)

## 2015-02-23 LAB — LIPID PANEL
Cholesterol: 211 mg/dL — ABNORMAL HIGH (ref 125–200)
HDL: 35 mg/dL — ABNORMAL LOW (ref >=40)
LDL Cholesterol: 157 mg/dL — ABNORMAL HIGH (ref <130)
Total CHOL/HDL Ratio: 6 Ratio — ABNORMAL HIGH (ref <=5.0)
Triglycerides: 97 mg/dL (ref <150)
VLDL: 19 mg/dL (ref <30)

## 2015-02-23 LAB — TESTOSTERONE: Testosterone: 596 ng/dL (ref 300–890)

## 2015-02-23 LAB — ANA: Anti Nuclear Antibody(ANA): POSITIVE — AB

## 2015-02-23 LAB — HEPATITIS C ANTIBODY: HCV Ab: NEGATIVE

## 2015-02-23 LAB — RHEUMATOID FACTOR: Rhuematoid fact SerPl-aCnc: <10 IU/mL (ref <=14)

## 2015-02-23 LAB — ANTI-NUCLEAR AB-TITER (ANA TITER): ANA Titer 1: NEGATIVE

## 2015-02-23 LAB — PSA: PSA: 0.65 ng/mL (ref <=4.00)

## 2015-02-23 LAB — SEDIMENTATION RATE: Sed Rate: 1 mm/hr (ref 0–20)

## 2015-03-07 MED ORDER — ATORVASTATIN CALCIUM 10 MG PO TABS: 10.0000 mg | ORAL_TABLET | Freq: Every day | ORAL | Status: DC

## 2015-03-09 LAB — HM COLONOSCOPY: HM Colonoscopy: NORMAL

## 2015-03-23 ENCOUNTER — Encounter (HOSPITAL_COMMUNITY): Payer: Self-pay | Admitting: *Deleted

## 2015-05-30 ENCOUNTER — Ambulatory Visit (INDEPENDENT_AMBULATORY_CARE_PROVIDER_SITE_OTHER): Payer: BLUE CROSS/BLUE SHIELD

## 2015-05-30 DIAGNOSIS — Z23 Encounter for immunization: Secondary | ICD-10-CM | POA: Diagnosis not present

## 2015-09-20 ENCOUNTER — Ambulatory Visit (INDEPENDENT_AMBULATORY_CARE_PROVIDER_SITE_OTHER): Payer: BLUE CROSS/BLUE SHIELD | Admitting: Family Medicine

## 2015-09-20 VITALS — BP 128/72 | HR 75 | Temp 97.7°F | Resp 20 | Ht 65.0 in | Wt 168.2 lb

## 2015-09-20 DIAGNOSIS — J069 Acute upper respiratory infection, unspecified: Secondary | ICD-10-CM

## 2015-09-20 DIAGNOSIS — E7800 Pure hypercholesterolemia, unspecified: Secondary | ICD-10-CM

## 2015-09-20 DIAGNOSIS — R7302 Impaired glucose tolerance (oral): Secondary | ICD-10-CM

## 2015-09-20 LAB — CBC WITH DIFFERENTIAL/PLATELET
Basophils Absolute: 0 10*3/uL (ref 0.0–0.1)
Basophils Relative: 1 % (ref 0–1)
Eosinophils Absolute: 0.1 10*3/uL (ref 0.0–0.7)
Eosinophils Relative: 3 % (ref 0–5)
HCT: 47.2 % (ref 39.0–52.0)
Hemoglobin: 15.8 g/dL (ref 13.0–17.0)
Lymphocytes Relative: 44 % (ref 12–46)
Lymphs Abs: 1.6 10*3/uL (ref 0.7–4.0)
MCH: 28.5 pg (ref 26.0–34.0)
MCHC: 33.5 g/dL (ref 30.0–36.0)
MCV: 85 fL (ref 78.0–100.0)
MPV: 10.2 fL (ref 8.6–12.4)
Monocytes Absolute: 0.5 10*3/uL (ref 0.1–1.0)
Monocytes Relative: 13 % — ABNORMAL HIGH (ref 3–12)
Neutro Abs: 1.4 10*3/uL — ABNORMAL LOW (ref 1.7–7.7)
Neutrophils Relative %: 39 % — ABNORMAL LOW (ref 43–77)
Platelets: 186 10*3/uL (ref 150–400)
RBC: 5.55 MIL/uL (ref 4.22–5.81)
RDW: 13.7 % (ref 11.5–15.5)
WBC: 3.6 10*3/uL — ABNORMAL LOW (ref 4.0–10.5)

## 2015-09-20 LAB — COMPREHENSIVE METABOLIC PANEL
ALT: 25 U/L (ref 9–46)
AST: 22 U/L (ref 10–35)
Albumin: 4.1 g/dL (ref 3.6–5.1)
Alkaline Phosphatase: 54 U/L (ref 40–115)
BUN: 16 mg/dL (ref 7–25)
CO2: 27 mmol/L (ref 20–31)
Calcium: 8.9 mg/dL (ref 8.6–10.3)
Chloride: 100 mmol/L (ref 98–110)
Creat: 1.34 mg/dL — ABNORMAL HIGH (ref 0.70–1.25)
Glucose, Bld: 112 mg/dL — ABNORMAL HIGH (ref 65–99)
Potassium: 4.5 mmol/L (ref 3.5–5.3)
Sodium: 139 mmol/L (ref 135–146)
Total Bilirubin: 0.7 mg/dL (ref 0.2–1.2)
Total Protein: 6.8 g/dL (ref 6.1–8.1)

## 2015-09-20 LAB — LIPID PANEL
Cholesterol: 141 mg/dL (ref 125–200)
HDL: 29 mg/dL — ABNORMAL LOW (ref >=40)
LDL Cholesterol: 96 mg/dL (ref <130)
Total CHOL/HDL Ratio: 4.9 Ratio (ref <=5.0)
Triglycerides: 82 mg/dL (ref <150)
VLDL: 16 mg/dL (ref <30)

## 2015-09-20 LAB — HEMOGLOBIN A1C
Hgb A1c MFr Bld: 6.3 % — ABNORMAL HIGH (ref <5.7)
Mean Plasma Glucose: 134 mg/dL — ABNORMAL HIGH (ref <117)

## 2015-09-20 MED ORDER — MUCINEX DM 30-600 MG PO TB12
1.0000 | ORAL_TABLET | Freq: Two times a day (BID) | ORAL | Status: DC
Start: 2015-09-20 — End: 2015-11-06

## 2015-09-20 MED ORDER — AMOXICILLIN-POT CLAVULANATE 875-125 MG PO TABS: 1.0000 | ORAL_TABLET | Freq: Two times a day (BID) | ORAL | Status: DC

## 2015-09-20 MED ORDER — AZELASTINE HCL 0.1 % NA SOLN: 2.0000 | Freq: Two times a day (BID) | NASAL | Status: DC

## 2015-09-20 NOTE — Progress Notes (Signed)
Subjective:    Patient ID: Earl Thomas, male    DOB: 09/14/1950, 65 y.o.   MRN: [id number]  09/20/2015  Cough and chest congestion   HPI This 65 y.o. male presents for evaluation of cough, congestion for five days.  No fever; +chills/sweats.  No body aches.  Slight headache.  ST from coughing; no ear pain.  Mild rhinorrhea; mild nasal congestion white.  +coughing most significant symptom; unable to produce sputum.  No wheezing.  No SOB.  +diarrhea; no vomiting.  Robitussin and Sudafed every four hours without improvement.  No tobacco; no asthma hx.  No sick contacts.    Hyperlipidemia:  Switched to Lipitor at last visit.  Fasting this morning; tolerating without side effects.  Glucose Intolerance:  Diagnosed at [facility] in 02/2015.   Review of Systems  Constitutional: Positive for chills and diaphoresis. Negative for fever, activity change, appetite change and fatigue.  HENT: Positive for congestion, postnasal drip, rhinorrhea and voice change. Negative for ear pain, sinus pressure, sore throat and trouble swallowing.   Respiratory: Positive for cough. Negative for shortness of breath and wheezing.   Cardiovascular: Negative for chest pain, palpitations and leg swelling.  Gastrointestinal: Positive for diarrhea. Negative for nausea, vomiting and abdominal pain.  Endocrine: Negative for cold intolerance, heat intolerance, polydipsia, polyphagia and polyuria.  Skin: Negative for color change, rash and wound.  Neurological: Negative for dizziness, tremors, seizures, syncope, facial asymmetry, speech difficulty, weakness, light-headedness, numbness and headaches.  Psychiatric/Behavioral: Negative for sleep disturbance and dysphoric mood. The patient is not nervous/anxious.     Past Medical History  Diagnosis Date  . Hyperlipidemia   . Pituitary macroadenoma ([place]) 10/06/2006    s/p NS consultation/[facility].   History reviewed. No pertinent past surgical history. No Known Allergies  Social  History   Social History  . Marital Status: Married    Spouse Name: N/A  . Number of Children: N/A  . Years of Education: N/A   Occupational History  . Professor    Social History Main Topics  . Smoking status: Never Smoker   . Smokeless tobacco: Not on file  . Alcohol Use: No  . Drug Use: No  . Sexual Activity: Not on file   Other Topics Concern  . 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      Tobacco: pipe in 1980s      Alcohol: none      Exercise: joined [employer]; going 2-3 times per week.        Seatbelt:  100%   Family History  Problem Relation Age of Onset  . Cancer Father 79    prostate cancer  . Diabetes Father   . Heart disease Father 95    AMI late 55s  . Cancer Mother 36    Breast cancer  . Heart disease Mother     CABG at age 54  . Diabetes Brother   . Heart disease Brother     AMI x 2  . Hyperlipidemia Brother        Objective:    BP 128/72 mmHg  Pulse 75  Temp(Src) 97.7 F (36.5 C) (Oral)  Resp 20  Ht _0  (1.651  m)  Wt 168 lb 3.2 oz (76.295 kg)  BMI 27.99 kg/m2  SpO2 94% Physical Exam  Constitutional: He is oriented to person, place, and time. He appears well-developed and well-nourished. No distress.  HENT:  Head: Normocephalic and atraumatic.  Right Ear: Tympanic membrane, external ear and ear canal normal.  Left Ear: Tympanic membrane, external ear and ear canal normal.  Nose: Mucosal edema and rhinorrhea present. Right sinus exhibits no maxillary sinus tenderness and no frontal sinus tenderness. Left sinus exhibits no maxillary sinus tenderness and no frontal sinus tenderness.  Mouth/Throat: Uvula is midline, oropharynx is clear and moist and mucous  membranes are normal. No oropharyngeal exudate, posterior oropharyngeal edema, posterior oropharyngeal erythema or tonsillar abscesses.  Eyes: Conjunctivae and EOM are normal. Pupils are equal, round, and reactive to light.  Neck: Normal range of motion. Neck supple. Carotid bruit is not present. No thyromegaly present.  Cardiovascular: Normal rate, regular rhythm, normal heart sounds and intact distal pulses.  Exam reveals no gallop and no friction rub.   No murmur heard. Pulmonary/Chest: Effort normal and breath sounds normal. He has no wheezes. He has no rales.  Lymphadenopathy:    He has no cervical adenopathy.  Neurological: He is alert and oriented to person, place, and time. No cranial nerve deficit.  Skin: Skin is warm and dry. No rash noted. He is not diaphoretic.  Psychiatric: He has a normal mood and affect. His behavior is normal.  Nursing note and vitals reviewed.  Results for orders placed or performed in visit on 03/23/15  [facility] COLONOSCOPY  Result Value Ref Range   [facility] Colonoscopy      normal colon; no specimens collected; repeat in 5 years       Assessment & Plan:   1. Acute upper respiratory infection   2. Pure hypercholesterolemia   3. Glucose intolerance (impaired glucose tolerance)     1. URI: New.  Rx for Astelin nasal spray and Mucinex DM provided; if no improvement in 5-7 days, start Augmentin.  Work note provided. 2.  Hypercholesterolemia: uncontrolled; switched to Lipitor at last visit; obtain labs. 3.  Glucose intolerance: New at [facility] 02/2015; repeat glucose and HgbA1c today; dietary modification encouraged.   Orders Placed This Encounter  Procedures  . CBC with Differential/Platelet  . Comprehensive metabolic panel    Order Specific Question:  Has the patient fasted?    Answer:  Yes  . Hemoglobin A1c  . Lipid panel    Order Specific Question:  Has the patient fasted?    Answer:  Yes   Meds ordered this encounter  Medications  .  amoxicillin-clavulanate (AUGMENTIN) 875-125 MG tablet    Sig: Take 1 tablet by mouth 2 (two) times daily.    Dispense:  14 tablet    Refill:  0  . Dextromethorphan-Guaifenesin (MUCINEX DM) 30-600 MG TB12    Sig: Take 1 tablet by mouth 2 (two) times daily.    Dispense:  28 each    Refill:  0  . azelastine (ASTELIN) 0.1 % nasal spray    Sig: Place 2 sprays into both nostrils 2 (two) times daily. Use in each nostril as directed    Dispense:  30 mL    Refill:  1  . atorvastatin (LIPITOR) 10 MG tablet    Sig:     Refill:  1    No Follow-up on file.    Kristi Elayne Guerin, M.D. [facility] [place], [place]  [address] [phone] phone [phone] fax

## 2015-09-20 NOTE — Patient Instructions (Addendum)
IF you received an x-ray today, you will receive an invoice from [facility]. Please contact [facility] at [phone] with questions or concerns regarding your invoice.   IF you received labwork today, you will receive an invoice from [employer]. Please contact [facility] at [phone] with questions or concerns regarding your invoice.   Our billing staff will not be able to assist you with questions regarding bills from these companies.  You will be contacted with the lab results as soon as they are available. The fastest way to get your results is to activate your My Chart account. Instructions are located on the last page of this paperwork. If you have not heard from [place] regarding the results in 2 weeks, please contact this office.   Upper Respiratory Infection, Adult Most upper respiratory infections (URIs) are a viral infection of the air passages leading to the lungs. A URI affects the nose, throat, and upper air passages. The most common type of URI is nasopharyngitis and is typically referred to as "the common cold." URIs run their course and usually go away on their own. Most of the time, a URI does not require medical attention, but sometimes a bacterial infection in the upper airways can follow a viral infection. This is called a secondary infection. Sinus and middle ear infections are common types of secondary upper respiratory infections. Bacterial pneumonia can also complicate a URI. A URI can worsen asthma and chronic obstructive pulmonary disease (COPD). Sometimes, these complications can require emergency medical care and may be life threatening.  CAUSES Almost all URIs are caused by viruses. A virus is a type of germ and can spread from one person to another.  RISKS FACTORS You may be at risk for a URI if:   You smoke.   You have chronic heart or lung disease.  You have a weakened defense (immune) system.   You are very young  or very old.   You have nasal allergies or asthma.  You work in crowded or poorly ventilated areas.  You work in health care facilities or schools. SIGNS AND SYMPTOMS  Symptoms typically develop 2-3 days after you come in contact with a cold virus. Most viral URIs last 7-10 days. However, viral URIs from the influenza virus (flu virus) can last 14-18 days and are typically more severe. Symptoms may include:   Runny or stuffy (congested) nose.   Sneezing.   Cough.   Sore throat.   Headache.   Fatigue.   Fever.   Loss of appetite.   Pain in your forehead, behind your eyes, and over your cheekbones (sinus pain).  Muscle aches.  DIAGNOSIS  Your health care provider may diagnose a URI by:  Physical exam.  Tests to check that your symptoms are not due to another condition such as:  Strep throat.  Sinusitis.  Pneumonia.  Asthma. TREATMENT  A URI goes away on its own with time. It cannot be cured with medicines, but medicines may be prescribed or recommended to relieve symptoms. Medicines may help:  Reduce your fever.  Reduce your cough.  Relieve nasal congestion. HOME CARE INSTRUCTIONS   Take medicines only as directed by your health care provider.   Gargle warm saltwater or take cough drops to comfort your throat as directed by your health care provider.  Use a warm mist humidifier or inhale steam from a shower to increase air moisture. This may make it easier to breathe.  Drink enough fluid to keep your  urine clear or pale yellow.   Eat soups and other clear broths and maintain good nutrition.   Rest as needed.   Return to work when your temperature has returned to normal or as your health care provider advises. You may need to stay home longer to avoid infecting others. You can also use a face mask and careful hand washing to prevent spread of the virus.  Increase the usage of your inhaler if you have asthma.   Do not use any tobacco  products, including cigarettes, chewing tobacco, or electronic cigarettes. If you need help quitting, ask your health care provider. PREVENTION  The best way to protect yourself from getting a cold is to practice good hygiene.   Avoid oral or hand contact with people with cold symptoms.   Wash your hands often if contact occurs.  There is no clear evidence that vitamin C, vitamin E, echinacea, or exercise reduces the chance of developing a cold. However, it is always recommended to get plenty of rest, exercise, and practice good nutrition.  SEEK MEDICAL CARE IF:   You are getting worse rather than better.   Your symptoms are not controlled by medicine.   You have chills.  You have worsening shortness of breath.  You have brown or red mucus.  You have yellow or brown nasal discharge.  You have pain in your face, especially when you bend forward.  You have a fever.  You have swollen neck glands.  You have pain while swallowing.  You have white areas in the back of your throat. SEEK IMMEDIATE MEDICAL CARE IF:   You have severe or persistent:  Headache.  Ear pain.  Sinus pain.  Chest pain.  You have chronic lung disease and any of the following:  Wheezing.  Prolonged cough.  Coughing up blood.  A change in your usual mucus.  You have a stiff neck.  You have changes in your:  Vision.  Hearing.  Thinking.  Mood. MAKE SURE YOU:   Understand these instructions.  Will watch your condition.  Will get help right away if you are not doing well or get worse.   This information is not intended to replace advice given to you by your health care provider. Make sure you discuss any questions you have with your health care provider.   Document Released: 12/18/2000 Document Revised: 11/08/2014 Document Reviewed: 09/29/2013 Elsevier Interactive Patient Education [employer].

## 2015-11-06 ENCOUNTER — Ambulatory Visit (INDEPENDENT_AMBULATORY_CARE_PROVIDER_SITE_OTHER): Payer: BLUE CROSS/BLUE SHIELD | Admitting: Family Medicine

## 2015-11-06 ENCOUNTER — Ambulatory Visit (INDEPENDENT_AMBULATORY_CARE_PROVIDER_SITE_OTHER): Payer: BLUE CROSS/BLUE SHIELD

## 2015-11-06 VITALS — BP 118/70 | HR 84 | Temp 98.1°F | Resp 16 | Ht 68.0 in | Wt 162.8 lb

## 2015-11-06 DIAGNOSIS — M25511 Pain in right shoulder: Secondary | ICD-10-CM | POA: Diagnosis not present

## 2015-11-06 DIAGNOSIS — S46001A Unspecified injury of muscle(s) and tendon(s) of the rotator cuff of right shoulder, initial encounter: Secondary | ICD-10-CM | POA: Diagnosis not present

## 2015-11-06 MED ORDER — TRAMADOL HCL 50 MG PO TABS: 50.0000 mg | ORAL_TABLET | Freq: Four times a day (QID) | ORAL | Status: DC | PRN

## 2015-11-06 NOTE — Patient Instructions (Addendum)
IF you received an x-ray today, you will receive an invoice from [facility]. Please contact [facility] at [phone] with questions or concerns regarding your invoice.   IF you received labwork today, you will receive an invoice from [employer]. Please contact [facility] at [phone] with questions or concerns regarding your invoice.   Our billing staff will not be able to assist you with questions regarding bills from these companies.  You will be contacted with the lab results as soon as they are available. The fastest way to get your results is to activate your My Chart account. Instructions are located on the last page of this paperwork. If you have not heard from [place] regarding the results in 2 weeks, please contact this office.    I am suspicious of a rotator cuff injury based on your type of injury and your exam today. You can continue the meloxicam 1 or 2 pills per day (maximum 15 mg daily), shoulder sling if needed only. Range of motion as tolerated each day.   If a stronger medication is needed, especially at night, I did prescribe a few Ultram if needed. This medicine can cause sedation. I will refer you to the orthopedic surgeon for follow-up in the next 2 weeks, and they can decide if MRI is needed at that time or possible injection. Return to clinic sooner if worse.   Rotator Cuff Injury Rotator cuff injury is any type of injury to the set of muscles and tendons that make up the stabilizing unit of your shoulder. This unit holds the ball of your upper arm bone (humerus) in the socket of your shoulder blade (scapula).  CAUSES Injuries to your rotator cuff most commonly come from sports or activities that cause your arm to be moved repeatedly over your head. Examples of this include throwing, weight lifting, swimming, or racquet sports. Long lasting (chronic) irritation of your rotator cuff can cause soreness and swelling  (inflammation), bursitis, and eventual damage to your tendons, such as a tear (rupture). SIGNS AND SYMPTOMS Acute rotator cuff tear:  Sudden tearing sensation followed by severe pain shooting from your upper shoulder down your arm toward your elbow.  Decreased range of motion of your shoulder because of pain and muscle spasm.  Severe pain.  Inability to raise your arm out to the side because of pain and loss of muscle power (large tears). Chronic rotator cuff tear:  Pain that usually is worse at night and may interfere with sleep.  Gradual weakness and decreased shoulder motion as the pain worsens.  Decreased range of motion. Rotator cuff tendinitis:  Deep ache in your shoulder and the outside upper arm over your shoulder.  Pain that comes on gradually and becomes worse when lifting your arm to the side or turning it inward. DIAGNOSIS Rotator cuff injury is diagnosed through a medical history, physical exam, and imaging exam. The medical history helps determine the type of rotator cuff injury. Your health care provider will look at your injured shoulder, feel the injured area, and ask you to move your shoulder in different positions. X-ray exams typically are done to rule out other causes of shoulder pain, such as fractures. MRI is the exam of choice for the most severe shoulder injuries because the images show muscles and tendons.  TREATMENT  Chronic tear:  Medicine for pain, such as acetaminophen or ibuprofen.  Physical therapy and range-of-motion exercises may be helpful in maintaining shoulder function and strength.  Steroid injections into your shoulder joint.  Surgical repair of the rotator cuff if the injury does not heal with noninvasive treatment. Acute tear:  Anti-inflammatory medicines such as ibuprofen and naproxen to help reduce pain and swelling.  A sling to help support your arm and rest your rotator cuff muscles. Long-term use of a sling is not advised. It may  cause significant stiffening of the shoulder joint.  Surgery may be considered within a few weeks, especially in younger, active people, to return the shoulder to full function.  Indications for surgical treatment include the following:  Age younger than 5 years.  Rotator cuff tears that are complete.  Physical therapy, rest, and anti-inflammatory medicines have been used for 6-8 weeks, with no improvement.  Employment or sporting activity that requires constant shoulder use. Tendinitis:  Anti-inflammatory medicines such as ibuprofen and naproxen to help reduce pain and swelling.  A sling to help support your arm and rest your rotator cuff muscles. Long-term use of a sling is not advised. It may cause significant stiffening of the shoulder joint.  Severe tendinitis may require:  Steroid injections into your shoulder joint.  Physical therapy.  Surgery. HOME CARE INSTRUCTIONS   Apply ice to your injury:  Put ice in a plastic bag.  Place a towel between your skin and the bag.  Leave the ice on for 20 minutes, 2-3 times a day.  If you have a shoulder immobilizer (sling and straps), wear it until told otherwise by your health care provider.  You may want to sleep on several pillows or in a recliner at night to lessen swelling and pain.  Only take over-the-counter or prescription medicines for pain, discomfort, or fever as directed by your health care provider.  Do simple hand squeezing exercises with a soft rubber ball to decrease hand swelling. SEEK MEDICAL CARE IF:   Your shoulder pain increases, or new pain or numbness develops in your arm, hand, or fingers.  Your hand or fingers are colder than your other hand. SEEK IMMEDIATE MEDICAL CARE IF:   Your arm, hand, or fingers are numb or tingling.  Your arm, hand, or fingers are increasingly swollen and painful, or they turn white or blue. MAKE SURE YOU:  Understand these instructions.  Will watch your  condition.  Will get help right away if you are not doing well or get worse.   This information is not intended to replace advice given to you by your health care provider. Make sure you discuss any questions you have with your health care provider.   Document Released: 06/21/2000 Document Revised: 06/29/2013 Document Reviewed: 02/03/2013 Elsevier Interactive Patient Education [employer].

## 2015-11-06 NOTE — Progress Notes (Addendum)
Subjective:  By signing my name below, I, Earl Thomas, attest that this documentation has been prepared under the direction and in the presence of [name], MD. Electronically Signed: [name], [name]. 11/06/2015 , 4:55 PM .  Patient was seen in Room 10 .   Patient ID: Earl Thomas, male    DOB: [birth year], 65 y.o.   MRN: [id number] Chief Complaint  Patient presents with  . Shoulder Injury    right 11/05/15   HPI Earl Thomas is a 65 y.o. male Here for right shoulder pain.   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 It's been bothering him for the past 2 weeks. Yesterday, while he was talking, he swung his arm forward and felt a pain in his right shoulder. He's felt weakness in his right shoulder. He's applied ice to the area, muscle cream and also used a heating pad at night. He's also taken 2 tablets of aleve. He denies any neck pain.   He looked up his symptoms online and read about rotator cuff problems. He's right hand dominant.   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 His denomination is [employer].   Patient Active Problem List   Diagnosis Date Noted  . Hyperlipidemia 03/10/2012   Past Medical History  Diagnosis Date  . Hyperlipidemia   . 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   No past surgical history on file. No Known Allergies Prior to Admission medications   Medication Sig Start Date End Date Taking? Authorizing Provider  aspirin 325 MG tablet Take 325 mg by mouth daily.   Yes Historical Provider, MD  atorvastatin (LIPITOR) 10 MG tablet  06/19/15  Yes Historical Provider, MD  azelastine (ASTELIN) 0.1 % nasal spray Place 2 sprays into both nostrils 2 (two) times daily. Use in each nostril as directed 09/20/15  Yes Wardell Honour, MD  meloxicam (MOBIC) 7.5 MG tablet Take 1 tablet (7.5 mg total) by mouth daily. 02/22/15  Yes Wardell Honour, MD    PROCTOSOL HC 2.5 % rectal cream APPLY ONE APPLICATORFUL RECTALLY TWICE DAILY 06/27/14  Yes Robyn Haber, MD  amoxicillin-clavulanate (AUGMENTIN) 875-125 MG tablet Take 1 tablet by mouth 2 (two) times daily. Patient not taking: Reported on 11/06/2015 09/20/15   Wardell Honour, MD  Dextromethorphan-Guaifenesin [facility] DM) 30-600 MG TB12 Take 1 tablet by mouth 2 (two) times daily. Patient not taking: Reported on 11/06/2015 09/20/15   Wardell Honour, MD  pravastatin (PRAVACHOL) 40 MG tablet TAKE ONE TABLET BY MOUTH ONCE DAILY. Patient not taking: Reported on 11/06/2015 02/22/15   Wardell Honour, MD   Social History   Social History  . Marital Status: Married    Spouse Name: N/A  . Number of Children: N/A  . Years of Education: N/A   Occupational History  . Professor    Social History Main Topics  . Smoking status: Never Smoker   . Smokeless tobacco: Not on file  . Alcohol Use: No  . Drug Use: No  . Sexual Activity: Not on file   Other Topics Concern  . 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      Tobacco: pipe in 1980s      Alcohol: none      Exercise: joined [employer]; going 2-3 times per week.        Seatbelt:  100%   Review of Systems  Constitutional: Negative for fever, chills and fatigue.  Musculoskeletal: Positive for arthralgias. Negative for myalgias, back pain, joint swelling, neck pain and neck stiffness.  Skin: Negative for rash and wound.  Neurological: Negative for weakness and numbness.       Objective:   Physical Exam  Constitutional: He is oriented to person, place, and time. He appears well-developed and well-nourished. No distress.  HENT:  Head: Normocephalic and atraumatic.   Eyes: EOM are normal. Pupils are equal, round, and reactive to light.  Neck: Neck supple.  Cardiovascular: Normal rate, regular rhythm and normal heart sounds.  Exam reveals no gallop and no friction rub.   No murmur heard. Pulmonary/Chest: Effort normal and breath sounds normal. No respiratory distress.  Musculoskeletal: Normal range of motion.  C-spine: full ROM, no focal bony tenderness, no reproduction of right shoulder pain  Right shoulder: [place] clavicle AC non tender Active ROM: guarded with minimal flexion at approximately 30-40 degrees, abduction approximately only to 30 degrees, equal internal rotation, negative lift-off  Passive ROM: full with flexion, full abduction, weak with external rotation, equal strength with internal rotation, pain with empty can testing  Neurological: He is alert and oriented to person, place, and time.  Skin: Skin is warm and dry.  Psychiatric: He has a normal mood and affect. His behavior is normal.  Nursing note and vitals reviewed.   Filed Vitals:   11/06/15 1458  BP: 118/70  Pulse: 84  Temp: 98.1 F (36.7 C)  TempSrc: Oral  Resp: 16  Height: _0  (1.727 m)  Weight: 162 lb 12.8 oz (73.846 kg)  SpO2: 98%   Dg Shoulder Right  11/06/2015  CLINICAL DATA:  Right shoulder pain.  Initial evaluation. EXAM: RIGHT SHOULDER - 2+ VIEW COMPARISON:  No recent prior. FINDINGS: Acromioclavicular glenohumeral degenerative change. No acute abnormality. No evidence of fracture, dislocation, or separation . IMPRESSION: Degenerative changes right shoulder.  No acute abnormality. Electronically Signed   By: Marcello Moores  Register   On: 11/06/2015 17:17      Assessment & Plan:   Earl Thomas is a 65 y.o. male Pain in joint of right shoulder - Plan: DG Shoulder Right, traMADol (ULTRAM) 50 MG tablet, Ambulatory referral to Orthopedic Surgery, Sling immobilizer  Rotator cuff injury, right, initial encounter - Plan: traMADol (ULTRAM) 50 MG tablet, Ambulatory referral to  Orthopedic Surgery, Sling immobilizer  Suspected rotator cuff tear of  right shoulder from injury yesterday, with some underlying DJD. Weakness with external rotation, and pain with supraspinatus testing, but negative drop arm testing (less likely full-thickness tear). Discussed options for treatment, including brace sling, anti-inflammatories and recheck in the next 2 weeks versus MRI or orthopedic evaluation. Based on weakness, will refer to orthopedics, and MRI necessity can be determined at that visit.  -will place in sling for symptomatic care only (advised to try range of motion throughout the day, and risk of stiffness/decreased range of motion with persistent sling use).   -Continue Mobic 7.5 mg up to 2 pills per day. Tramadol if needed for breakthrough pain. SED.   -Refer to orthopedics for further evaluation. RTC precautions if worse.   Meds ordered this encounter  Medications  . traMADol (ULTRAM) 50 MG tablet  Sig: Take 1 tablet (50 mg total) by mouth every 6 (six) hours as needed.    Dispense:  20 tablet    Refill:  0   Patient Instructions       IF you received an x-ray today, you will receive an invoice from [facility]. Please contact [facility] at [phone] with questions or concerns regarding your invoice.   IF you received labwork today, you will receive an invoice from [employer]. Please contact [facility] at [phone] with questions or concerns regarding your invoice.   Our billing staff will not be able to assist you with questions regarding bills from these companies.  You will be contacted with the lab results as soon as they are available. The fastest way to get your results is to activate your My Chart account. Instructions are located on the last page of this paperwork. If you have not heard from [place] regarding the results in 2 weeks, please contact this office.    I am suspicious of a rotator cuff injury based  on your type of injury and your exam today. You can continue the meloxicam 1 or 2 pills per day (maximum 15 mg daily), shoulder sling if needed only. Range of motion as tolerated each day.   If a stronger medication is needed, especially at night, I did prescribe a few Ultram if needed. This medicine can cause sedation. I will refer you to the orthopedic surgeon for follow-up in the next 2 weeks, and they can decide if MRI is needed at that time or possible injection. Return to clinic sooner if worse.   Rotator Cuff Injury Rotator cuff injury is any type of injury to the set of muscles and tendons that make up the stabilizing unit of your shoulder. This unit holds the ball of your upper arm bone (humerus) in the socket of your shoulder blade (scapula).  CAUSES Injuries to your rotator cuff most commonly come from sports or activities that cause your arm to be moved repeatedly over your head. Examples of this include throwing, weight lifting, swimming, or racquet sports. Long lasting (chronic) irritation of your rotator cuff can cause soreness and swelling (inflammation), bursitis, and eventual damage to your tendons, such as a tear (rupture). SIGNS AND SYMPTOMS Acute rotator cuff tear:  Sudden tearing sensation followed by severe pain shooting from your upper shoulder down your arm toward your elbow.  Decreased range of motion of your shoulder because of pain and muscle spasm.  Severe pain.  Inability to raise your arm out to the side because of pain and loss of muscle power (large tears). Chronic rotator cuff tear:  Pain that usually is worse at night and may interfere with sleep.  Gradual weakness and decreased shoulder motion as the pain worsens.  Decreased range of motion. Rotator cuff tendinitis:  Deep ache in your shoulder and the outside upper arm over your shoulder.  Pain that comes on gradually and becomes worse when lifting your arm to the side or turning it  inward. DIAGNOSIS Rotator cuff injury is diagnosed through a medical history, physical exam, and imaging exam. The medical history helps determine the type of rotator cuff injury. Your health care provider will look at your injured shoulder, feel the injured area, and ask you to move your shoulder in different positions. X-ray exams typically are done to rule out other causes of shoulder pain, such as fractures. MRI is the exam of choice for the most severe shoulder injuries because  the images show muscles and tendons.  TREATMENT  Chronic tear:  Medicine for pain, such as acetaminophen or ibuprofen.  Physical therapy and range-of-motion exercises may be helpful in maintaining shoulder function and strength.  Steroid injections into your shoulder joint.  Surgical repair of the rotator cuff if the injury does not heal with noninvasive treatment. Acute tear:  Anti-inflammatory medicines such as ibuprofen and naproxen to help reduce pain and swelling.  A sling to help support your arm and rest your rotator cuff muscles. Long-term use of a sling is not advised. It may cause significant stiffening of the shoulder joint.  Surgery may be considered within a few weeks, especially in younger, active people, to return the shoulder to full function.  Indications for surgical treatment include the following:  Age younger than 15 years.  Rotator cuff tears that are complete.  Physical therapy, rest, and anti-inflammatory medicines have been used for 6-8 weeks, with no improvement.  Employment or sporting activity that requires constant shoulder use. Tendinitis:  Anti-inflammatory medicines such as ibuprofen and naproxen to help reduce pain and swelling.  A sling to help support your arm and rest your rotator cuff muscles. Long-term use of a sling is not advised. It may cause significant stiffening of the shoulder joint.  Severe tendinitis may require:  Steroid injections into your shoulder  joint.  Physical therapy.  Surgery. HOME CARE INSTRUCTIONS   Apply ice to your injury:  Put ice in a plastic bag.  Place a towel between your skin and the bag.  Leave the ice on for 20 minutes, 2-3 times a day.  If you have a shoulder immobilizer (sling and straps), wear it until told otherwise by your health care provider.  You may want to sleep on several pillows or in a recliner at night to lessen swelling and pain.  Only take over-the-counter or prescription medicines for pain, discomfort, or fever as directed by your health care provider.  Do simple hand squeezing exercises with a soft rubber ball to decrease hand swelling. SEEK MEDICAL CARE IF:   Your shoulder pain increases, or new pain or numbness develops in your arm, hand, or fingers.  Your hand or fingers are colder than your other hand. SEEK IMMEDIATE MEDICAL CARE IF:   Your arm, hand, or fingers are numb or tingling.  Your arm, hand, or fingers are increasingly swollen and painful, or they turn white or blue. MAKE SURE YOU:  Understand these instructions.  Will watch your condition.  Will get help right away if you are not doing well or get worse.   This information is not intended to replace advice given to you by your health care provider. Make sure you discuss any questions you have with your health care provider.   Document Released: 06/21/2000 Document Revised: 06/29/2013 Document Reviewed: 02/03/2013 Elsevier Interactive Patient Education [employer].     I personally performed the services described in this documentation, which was scribed in my presence. The recorded information has been reviewed and considered, and addended by me as needed.

## 2016-01-02 HISTORY — PX: ROTATOR CUFF REPAIR: SHX139

## 2016-01-11 ENCOUNTER — Ambulatory Visit (INDEPENDENT_AMBULATORY_CARE_PROVIDER_SITE_OTHER): Payer: BLUE CROSS/BLUE SHIELD | Admitting: Family Medicine

## 2016-01-11 ENCOUNTER — Encounter: Payer: Self-pay | Admitting: Family Medicine

## 2016-01-11 VITALS — BP 106/66 | HR 97 | Temp 98.0°F | Resp 16 | Ht 64.5 in | Wt 164.2 lb

## 2016-01-11 DIAGNOSIS — Z8249 Family history of ischemic heart disease and other diseases of the circulatory system: Secondary | ICD-10-CM | POA: Diagnosis not present

## 2016-01-11 DIAGNOSIS — E785 Hyperlipidemia, unspecified: Secondary | ICD-10-CM | POA: Diagnosis not present

## 2016-01-11 DIAGNOSIS — R7989 Other specified abnormal findings of blood chemistry: Secondary | ICD-10-CM

## 2016-01-11 DIAGNOSIS — R748 Abnormal levels of other serum enzymes: Secondary | ICD-10-CM | POA: Diagnosis not present

## 2016-01-11 DIAGNOSIS — R7303 Prediabetes: Secondary | ICD-10-CM | POA: Diagnosis not present

## 2016-01-11 DIAGNOSIS — Z125 Encounter for screening for malignant neoplasm of prostate: Secondary | ICD-10-CM

## 2016-01-11 DIAGNOSIS — Z0000 Encounter for general adult medical examination without abnormal findings: Secondary | ICD-10-CM | POA: Diagnosis not present

## 2016-01-11 DIAGNOSIS — Z8042 Family history of malignant neoplasm of prostate: Secondary | ICD-10-CM | POA: Diagnosis not present

## 2016-01-11 LAB — HEMOGLOBIN A1C
Hgb A1c MFr Bld: 5.9 % — ABNORMAL HIGH (ref <5.7)
Mean Plasma Glucose: 123 mg/dL

## 2016-01-11 MED ORDER — ATORVASTATIN CALCIUM 10 MG PO TABS: 10.0000 mg | ORAL_TABLET | Freq: Every day | ORAL | Status: DC

## 2016-01-11 NOTE — Patient Instructions (Addendum)
IF you received an x-ray today, you will receive an invoice from [facility]. Please contact [facility] at [phone] with questions or concerns regarding your invoice.   IF you received labwork today, you will receive an invoice from [employer]. Please contact [facility] at [phone] with questions or concerns regarding your invoice.   Our billing staff will not be able to assist you with questions regarding bills from these companies.  You will be contacted with the lab results as soon as they are available. The fastest way to get your results is to activate your My Chart account. Instructions are located on the last page of this paperwork. If you have not heard from [place] regarding the results in 2 weeks, please contact this office.    We will let you know about the blood tests once they're available. Continue to maintain activity and exercise as well as avoiding sugar containing beverages, sweets, and watch carbohydrate intake with the prediabetes. I included a prediabetes eating plan below to help with food choices. Also make sure you're drinking plenty of fluids, and avoid medicines such as ibuprofen and Aleve as much as possible as your kidney function test was slightly elevated from when last checked. We will repeat this test today.  I will refer you to a cardiologist to decide on stress testing based on multiple family members with heart disease. If you have any chest pain, shortness of breath, or new symptoms, return here or the emergency room.  No change in medications for now. Follow up with Dr. Tamala Julian in the next 6 months.  Keeping you healthy  Get these tests  Blood pressure- Have your blood pressure checked once a year by your healthcare provider.  Normal blood pressure is 120/80  Weight- Have your body mass index (BMI) calculated to screen for obesity.  BMI is a measure of body fat based on height and weight. You can also  calculate your own BMI at [email or web address].  Cholesterol- Have your cholesterol checked every year.  Diabetes- Have your blood sugar checked regularly if you have high blood pressure, high cholesterol, have a family history of diabetes or if you are overweight.  Screening for Colon Cancer- Colonoscopy starting at age 67.  Screening may begin sooner depending on your family history and other health conditions. Follow up colonoscopy as directed by your Gastroenterologist.  Screening for Prostate Cancer- Both blood work (PSA) and a rectal exam help screen for Prostate Cancer.  Screening begins at age 76 with African-American men and at age 45 with Caucasian men.  Screening may begin sooner depending on your family history.  Take these medicines  Aspirin- One aspirin daily can help prevent Heart disease and Stroke.  Flu shot- Every fall.  Tetanus- Every 10 years.  Zostavax- Once after the age of 41 to prevent Shingles.  Pneumonia shot- Once after the age of 4; if you are younger than 39, ask your healthcare provider if you need a Pneumonia shot.  Take these steps  Don't smoke- If you do smoke, talk to your doctor about quitting.  For tips on how to quit, go to www.smokefree.gov or call 1-800-QUIT-NOW.  Be physically active- Exercise 5 days a week for at least 30 minutes.  If you are not already physically active start slow and gradually work up to 30 minutes of moderate physical activity.  Examples of moderate activity include walking briskly, mowing the yard, dancing, swimming, bicycling, etc.  Eat a healthy diet- Eat  a variety of healthy food such as fruits, vegetables, low fat milk, low fat cheese, yogurt, lean meant, poultry, fish, beans, tofu, etc. For more information go to www.thenutritionsource.org  Drink alcohol in moderation- Limit alcohol intake to less than two drinks a day. Never drink and drive.  Dentist- Brush and floss twice daily; visit your dentist twice a  year.  Depression- Your emotional health is as important as your physical health. If you're feeling down, or losing interest in things you would normally enjoy please talk to your healthcare provider.  Eye exam- Visit your eye doctor every year.  Safe sex- If you may be exposed to a sexually transmitted infection, use a condom.  Seat belts- Seat belts can save your life; always wear one.  Smoke/Carbon Monoxide detectors- These detectors need to be installed on the appropriate level of your home.  Replace batteries at least once a year.  Skin cancer- When out in the sun, cover up and use sunscreen 15 SPF or higher.  Violence- If anyone is threatening you, please tell your healthcare provider.  Living Will/ Health care power of attorney- Speak with your healthcare provider and family.  Prediabetes Eating Plan Prediabetes--also called impaired glucose tolerance or impaired fasting glucose--is a condition that causes blood sugar (blood glucose) levels to be higher than normal. Following a healthy diet can help to keep prediabetes under control. It can also help to lower the risk of type 2 diabetes and heart disease, which are increased in people who have prediabetes. Along with regular exercise, a healthy diet:  Promotes weight loss.  Helps to control blood sugar levels.  Helps to improve the way that the body uses insulin. WHAT DO I NEED TO KNOW ABOUT THIS EATING PLAN?  Use the glycemic index (GI) to plan your meals. The index tells you how quickly a food will raise your blood sugar. Choose low-GI foods. These foods take a longer time to raise blood sugar.  Pay close attention to the amount of carbohydrates in the food that you eat. Carbohydrates increase blood sugar levels.  Keep track of how many calories you take in. Eating the right amount of calories will help you to achieve a healthy weight. Losing about 7 percent of your starting weight can help to prevent type 2 diabetes.  You  may want to follow a Mediterranean diet. This diet includes a lot of vegetables, lean meats or fish, whole grains, fruits, and healthy oils and fats. WHAT FOODS CAN I EAT? Grains Whole grains, such as whole-wheat or whole-grain breads, crackers, cereals, and pasta. Unsweetened oatmeal. Bulgur. Barley. Quinoa. Brown rice. Corn or whole-wheat flour tortillas or taco shells. Vegetables Lettuce. Spinach. Peas. Beets. Cauliflower. Cabbage. Broccoli. Carrots. Tomatoes. Squash. Eggplant. Herbs. Peppers. Onions. Cucumbers. Brussels sprouts. Fruits Berries. Bananas. Apples. Oranges. Grapes. Papaya. Mango. Pomegranate. Kiwi. Grapefruit. Cherries. Meats and Other Protein Sources Seafood. Lean meats, such as chicken and [place] or lean cuts of pork and beef. Tofu. Eggs. Nuts. Beans. Dairy Low-fat or fat-free dairy products, such as yogurt, cottage cheese, and cheese. Beverages Water. Tea. Coffee. Sugar-free or diet soda. Seltzer water. Milk. Milk alternatives, such as soy or almond milk. Condiments Mustard. Relish. Low-fat, low-sugar ketchup. Low-fat, low-sugar barbecue sauce. Low-fat or fat-free mayonnaise. Sweets and Desserts Sugar-free or low-fat pudding. Sugar-free or low-fat ice cream and other frozen treats. Fats and Oils Avocado. Walnuts. Olive oil. The items listed above may not be a complete list of recommended foods or beverages. Contact your dietitian for more options.  WHAT FOODS ARE NOT RECOMMENDED? Grains Refined white flour and flour products, such as bread, pasta, snack foods, and cereals. Beverages Sweetened drinks, such as sweet iced tea and soda. Sweets and Desserts Baked goods, such as cake, cupcakes, pastries, cookies, and cheesecake. The items listed above may not be a complete list of foods and beverages to avoid. Contact your dietitian for more information.   This information is not intended to replace advice given to you by your health care provider. Make sure you discuss  any questions you have with your health care provider.   Document Released: 11/08/2014 Document Reviewed: 11/08/2014 Elsevier Interactive Patient Education [employer].

## 2016-01-11 NOTE — Progress Notes (Signed)
By signing my name below, I, Mesha Guinyard, attest that this documentation has been prepared under the direction and in the presence of [name], MD.  Electronically Signed: Verlee Monte, Medical Scribe. 01/11/2016. 2:08 PM.  Subjective:    Patient ID: Earl Thomas, male    DOB: 1951/02/22, 65 y.o.   MRN: [id number]  HPI Chief Complaint  Patient presents with  . Annual Exam    HPI Comments: Earl Thomas is a 65 y.o. male who presents to the [facility] here for an annual physical. Pt was seen by me for shoulder injury in May. His PCP is listed as Dr. Tamala Julian. Pt states activity change, sinus pressure, redness, and environmental allergies has been going on for a while. Pt uses Visine to relieve eye redness. Pt has never had a heart attack. Pt had his shoulder fixed June 26th and states he's doing fine. Pt states his hips, knees, and things are fine. Pt would ike to get a cholesterol medication refill today. FHx: Dad was in his 31s when he had a heart attack, and 63 y.o when he had prostate CA. Mom had heart disease. Brother had CVA at 71 y/o. Pt states they all survived from their heart attack.  Cancer Screening: Colonoscopy: Sept 2016, nl repeat in 5 years. Prostate CA Screening: Lab Results  Component Value Date   PSA 0.65 02/22/2015   PSA 0.69 01/20/2014   PSA 0.67 01/27/2013   Immunizations: Immunization History  Administered Date(s) Administered  . Influenza,inj,Quad PF,36+ Mos 05/30/2015  . Influenza-Unspecified 04/07/2014  . Tdap 02/13/2009  . Zoster 04/12/2014   Vision: Pt last saw his ophthalmologist in April and had nl results. No exam data present  Dentist: Pt sees his dentist every 6 months.  Exercise/Diet: Pt goes to [employer] a couple of days a week. Pt has been trying to change his diet around since his last office visit with Dr.Smith.  Depression: Depression screen [facility] 2/9 01/11/2016 11/06/2015 09/20/2015 02/22/2015 12/14/2014    Decreased Interest 0 0 0 0 0  Down, Depressed, Hopeless 0 0 0 0 0  PHQ - 2 Score 0 0 0 0 0   Fall Screening: No falls in the past year.  HLD: Takes Lipitor 10 mg QD. Pt denies experiencing any chest pain, chest tightness, and SOB. Lab Results  Component Value Date   CHOL 141 09/20/2015   HDL 29* 09/20/2015   LDLCALC 96 09/20/2015   TRIG 82 09/20/2015   CHOLHDL 4.9 09/20/2015   Lab Results  Component Value Date   ALT 25 09/20/2015   AST 22 09/20/2015   ALKPHOS 54 09/20/2015   BILITOT 0.7 09/20/2015   Hep C Screening: Negative Hep C antibody last year.  Pre-DM: Diet changes were recommended at last physical Aug 2016. Lab Results  Component Value Date   HGBA1C 6.3* 09/20/2015   Wt Readings from Last 3 Encounters:  01/11/16 164 lb 3.2 oz (74.481 kg)  11/06/15 162 lb 12.8 oz (73.846 kg)  09/20/15 168 lb 3.2 oz (76.295 kg)   Elevated Creatinine: 1.34 in March  Advance Directives: Pt doesn't have one but is interested.  Patient Active Problem List   Diagnosis Date Noted  . Hyperlipidemia 03/10/2012   Past Medical History  Diagnosis Date  . Hyperlipidemia   . 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  . Arthritis    Past Surgical History  Procedure Laterality Date  . Rotator cuff repair Right 01/02/16   No  Known Allergies Prior to Admission medications   Medication Sig Start Date End Date Taking? Authorizing Provider  aspirin 325 MG tablet Take 325 mg by mouth daily.    Historical Provider, MD  atorvastatin (LIPITOR) 10 MG tablet  06/19/15   Historical Provider, MD  azelastine (ASTELIN) 0.1 % nasal spray Place 2 sprays into both nostrils 2 (two) times daily. Use in each nostril as directed 09/20/15   Wardell Honour, MD  meloxicam (MOBIC) 7.5 MG tablet Take 1 tablet (7.5 mg total) by mouth daily. 02/22/15   Wardell Honour, MD  PROCTOSOL HC 2.5 % rectal cream APPLY ONE APPLICATORFUL RECTALLY TWICE DAILY 06/27/14   Robyn Haber, MD   traMADol (ULTRAM) 50 MG tablet Take 1 tablet (50 mg total) by mouth every 6 (six) hours as needed. 11/06/15   Wendie Agreste, MD   Social History   Social History  . Marital Status: Married    Spouse Name: N/A  . Number of Children: N/A  . Years of Education: N/A   Occupational History  . Professor    Social History Main Topics  . Smoking status: Never Smoker   . Smokeless tobacco: Not on file  . Alcohol Use: No  . Drug Use: No  . Sexual Activity: Not on file   Other Topics Concern  . 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      Tobacco: pipe in 1980s      Alcohol: none      Exercise: joined [employer]; going 2-3 times per week.        Seatbelt:  100%   Review of Systems  Respiratory: Negative for chest tightness and shortness of breath.   Cardiovascular: Negative for chest pain.  13 point ROS positive for activity change, sinus pressure, redness, and environmental allergies. Objective:  BP 106/66 mmHg  Pulse 97  Temp(Src) 98 F (36.7 C) (Other (Comment))  Resp 16  Ht 5' 4.5" (1.638 m)  Wt 164 lb 3.2 oz (74.481 kg)  BMI 27.76 kg/m2  SpO2 97%  Physical Exam  Constitutional: He is oriented to person, place, and time. He appears well-developed and well-nourished.  HENT:  Head: Normocephalic and atraumatic.  Right Ear: External ear normal.  Left Ear: External ear normal.  Mouth/Throat: Oropharynx is clear and moist.  Eyes: Conjunctivae and EOM are normal. Pupils are equal, round, and reactive to light.  Neck: Normal range of motion. Neck supple. No thyromegaly present.  Cardiovascular: Normal rate, regular rhythm, normal heart sounds and intact distal pulses.   Pulmonary/Chest: Effort  normal and breath sounds normal. No respiratory distress. He has no wheezes.  Abdominal: Soft. He exhibits no distension. There is no tenderness. Hernia confirmed negative in the right inguinal area and confirmed negative in the left inguinal area.  Genitourinary: Prostate normal.  Musculoskeletal: Normal range of motion. He exhibits no edema or tenderness.  Lymphadenopathy:    He has no cervical adenopathy.  Neurological: He is alert and oriented to person, place, and time. He has normal reflexes.  Skin: Skin is warm and dry.  Psychiatric: He has a normal mood and affect. His behavior is normal.  Vitals reviewed.  EKG Reading:  No acute findings  Assessment & Plan:   Earl Thomas is a 65 y.o. male Annual physical exam  --anticipatory guidance as below in AVS, screening labs above. Health maintenance items as above in HPI discussed/recommended as applicable.   Family history of early CAD - Plan: Ambulatory referral to Cardiology, EKG 12-Lead  -Eval by cardiology to determine if stress testing needed with family history. Asymptomatic currently.  Hyperlipidemia - Plan: Ambulatory referral to Cardiology, COMPLETE METABOLIC PANEL WITH GFR, atorvastatin (LIPITOR) 10 MG tablet, EKG 12-Lead  -Continue Lipitor, CMP pending.  Family history of prostate cancer - Plan: PSA Screening for prostate cancer - Plan: PSA  -We discussed pros and cons of prostate cancer screening, and after this discussion, he chose to have screening done. PSA obtained, and no concerning findings on DRE.   Prediabetes - Plan: Hemoglobin A1c, Ambulatory referral to Cardiology, EKG 12-Lead  -Diet and exercise discussed, A1c pending.  Elevated serum creatinine - Plan: COMPLETE METABOLIC PANEL WITH GFR  -Maintain hydration, avoid nephrotoxins, repeat CMP pending  Meds ordered this encounter  Medications  . diazepam (VALIUM) 5 MG tablet    Sig:     Refill:  0  . meloxicam (MOBIC) 15 MG tablet    Sig:     Refill:  0    . ondansetron (ZOFRAN) 4 MG tablet    Sig:     Refill:  0  . oxyCODONE-acetaminophen (PERCOCET/ROXICET) 5-325 MG tablet    Sig:     Refill:  0  . atorvastatin (LIPITOR) 10 MG tablet    Sig: Take 1 tablet (10 mg total) by mouth daily at 6 PM.    Dispense:  90 tablet    Refill:  1   Patient Instructions       IF you received an x-ray today, you will receive an invoice from [facility]. Please contact [facility] at [phone] with questions or concerns regarding your invoice.   IF you received labwork today, you will receive an invoice from [employer]. Please contact [facility] at [phone] with questions or concerns regarding your invoice.   Our billing staff will not be able to assist you with questions regarding bills from these companies.  You will be contacted with the lab results as soon as they are available. The fastest way to get your results is to activate your My Chart account. Instructions are located on the last page of this paperwork. If you have not heard from [place] regarding the results in 2 weeks, please contact this office.    We will let you know about the blood tests once they're available. Continue to maintain activity and exercise as well as avoiding sugar containing beverages, sweets, and watch carbohydrate intake with the prediabetes. I included a prediabetes eating plan below to help with food choices. Also make sure you're drinking plenty of fluids, and avoid medicines such as ibuprofen and Aleve as much as possible as your kidney function test was slightly elevated from when last checked. We will repeat this test today.  I will refer you to a cardiologist to decide on stress testing based on multiple family members with heart disease. If you have any chest pain, shortness of breath, or new symptoms, return here or the emergency room.  No change in medications for now. Follow up with Dr. Tamala Julian in the next 6  months.  Keeping you healthy  Get these tests  Blood pressure- Have your blood pressure checked once a year by your healthcare  provider.  Normal blood pressure is 120/80  Weight- Have your body mass index (BMI) calculated to screen for obesity.  BMI is a measure of body fat based on height and weight. You can also calculate your own BMI at ViewBanking.si.  Cholesterol- Have your cholesterol checked every year.  Diabetes- Have your blood sugar checked regularly if you have high blood pressure, high cholesterol, have a family history of diabetes or if you are overweight.  Screening for Colon Cancer- Colonoscopy starting at age 52.  Screening may begin sooner depending on your family history and other health conditions. Follow up colonoscopy as directed by your Gastroenterologist.  Screening for Prostate Cancer- Both blood work (PSA) and a rectal exam help screen for Prostate Cancer.  Screening begins at age 86 with African-American men and at age 21 with Caucasian men.  Screening may begin sooner depending on your family history.  Take these medicines  Aspirin- One aspirin daily can help prevent Heart disease and Stroke.  Flu shot- Every fall.  Tetanus- Every 10 years.  Zostavax- Once after the age of 12 to prevent Shingles.  Pneumonia shot- Once after the age of 21; if you are younger than 28, ask your healthcare provider if you need a Pneumonia shot.  Take these steps  Don't smoke- If you do smoke, talk to your doctor about quitting.  For tips on how to quit, go to www.smokefree.gov or call 1-800-QUIT-NOW.  Be physically active- Exercise 5 days a week for at least 30 minutes.  If you are not already physically active start slow and gradually work up to 30 minutes of moderate physical activity.  Examples of moderate activity include walking briskly, mowing the yard, dancing, swimming, bicycling, etc.  Eat a healthy diet- Eat a variety of healthy food such as fruits,  vegetables, low fat milk, low fat cheese, yogurt, lean meant, poultry, fish, beans, tofu, etc. For more information go to www.thenutritionsource.org  Drink alcohol in moderation- Limit alcohol intake to less than two drinks a day. Never drink and drive.  Dentist- Brush and floss twice daily; visit your dentist twice a year.  Depression- Your emotional health is as important as your physical health. If you're feeling down, or losing interest in things you would normally enjoy please talk to your healthcare provider.  Eye exam- Visit your eye doctor every year.  Safe sex- If you may be exposed to a sexually transmitted infection, use a condom.  Seat belts- Seat belts can save your life; always wear one.  Smoke/Carbon Monoxide detectors- These detectors need to be installed on the appropriate level of your home.  Replace batteries at least once a year.  Skin cancer- When out in the sun, cover up and use sunscreen 15 SPF or higher.  Violence- If anyone is threatening you, please tell your healthcare provider.  Living Will/ Health care power of attorney- Speak with your healthcare provider and family.  Prediabetes Eating Plan Prediabetes--also called impaired glucose tolerance or impaired fasting glucose--is a condition that causes blood sugar (blood glucose) levels to be higher than normal. Following a healthy diet can help to keep prediabetes under control. It can also help to lower the risk of type 2 diabetes and heart disease, which are increased in people who have prediabetes. Along with regular exercise, a healthy diet:  Promotes weight loss.  Helps to control blood sugar levels.  Helps to improve the way that the body uses insulin. WHAT DO I NEED TO KNOW ABOUT THIS EATING PLAN?  Use the glycemic index (GI) to plan your meals. The index tells you how quickly a food will raise your blood sugar. Choose low-GI foods. These foods take a longer time to raise blood sugar.  Pay close  attention to the amount of carbohydrates in the food that you eat. Carbohydrates increase blood sugar levels.  Keep track of how many calories you take in. Eating the right amount of calories will help you to achieve a healthy weight. Losing about 7 percent of your starting weight can help to prevent type 2 diabetes.  You may want to follow a Mediterranean diet. This diet includes a lot of vegetables, lean meats or fish, whole grains, fruits, and healthy oils and fats. WHAT FOODS CAN I EAT? Grains Whole grains, such as whole-wheat or whole-grain breads, crackers, cereals, and pasta. Unsweetened oatmeal. Bulgur. Barley. Quinoa. Brown rice. Corn or whole-wheat flour tortillas or taco shells. Vegetables Lettuce. Spinach. Peas. Beets. Cauliflower. Cabbage. Broccoli. Carrots. Tomatoes. Squash. Eggplant. Herbs. Peppers. Onions. Cucumbers. Brussels sprouts. Fruits Berries. Bananas. Apples. Oranges. Grapes. Papaya. Mango. Pomegranate. Kiwi. Grapefruit. Cherries. Meats and Other Protein Sources Seafood. Lean meats, such as chicken and [place] or lean cuts of pork and beef. Tofu. Eggs. Nuts. Beans. Dairy Low-fat or fat-free dairy products, such as yogurt, cottage cheese, and cheese. Beverages Water. Tea. Coffee. Sugar-free or diet soda. Seltzer water. Milk. Milk alternatives, such as soy or almond milk. Condiments Mustard. Relish. Low-fat, low-sugar ketchup. Low-fat, low-sugar barbecue sauce. Low-fat or fat-free mayonnaise. Sweets and Desserts Sugar-free or low-fat pudding. Sugar-free or low-fat ice cream and other frozen treats. Fats and Oils Avocado. Walnuts. Olive oil. The items listed above may not be a complete list of recommended foods or beverages. Contact your dietitian for more options.  WHAT FOODS ARE NOT RECOMMENDED? Grains Refined white flour and flour products, such as bread, pasta, snack foods, and cereals. Beverages Sweetened drinks, such as sweet iced tea and soda. Sweets and  Desserts Baked goods, such as cake, cupcakes, pastries, cookies, and cheesecake. The items listed above may not be a complete list of foods and beverages to avoid. Contact your dietitian for more information.   This information is not intended to replace advice given to you by your health care provider. Make sure you discuss any questions you have with your health care provider.   Document Released: 11/08/2014 Document Reviewed: 11/08/2014 Elsevier Interactive Patient Education [employer].     I personally performed the services described in this documentation, which was scribed in my presence. The recorded information has been reviewed and considered, and addended by me as needed.   Signed,   [name], MD [facility].  01/13/2016 11:09 AM

## 2016-01-12 LAB — COMPLETE METABOLIC PANEL WITH GFR
ALT: 23 U/L (ref 9–46)
AST: 18 U/L (ref 10–35)
Albumin: 4.3 g/dL (ref 3.6–5.1)
Alkaline Phosphatase: 61 U/L (ref 40–115)
BUN: 15 mg/dL (ref 7–25)
CO2: 23 mmol/L (ref 20–31)
Calcium: 8.8 mg/dL (ref 8.6–10.3)
Chloride: 102 mmol/L (ref 98–110)
Creat: 1.27 mg/dL — ABNORMAL HIGH (ref 0.70–1.25)
GFR, Est African American: 70 mL/min (ref >=60)
GFR, Est Non African American: 60 mL/min (ref >=60)
Glucose, Bld: 116 mg/dL — ABNORMAL HIGH (ref 65–99)
Potassium: 4.3 mmol/L (ref 3.5–5.3)
Sodium: 136 mmol/L (ref 135–146)
Total Bilirubin: 0.7 mg/dL (ref 0.2–1.2)
Total Protein: 6.8 g/dL (ref 6.1–8.1)

## 2016-01-12 LAB — PSA: PSA: 0.61 ng/mL (ref <=4.00)

## 2016-02-15 ENCOUNTER — Ambulatory Visit (INDEPENDENT_AMBULATORY_CARE_PROVIDER_SITE_OTHER): Payer: BLUE CROSS/BLUE SHIELD | Admitting: Physical Therapy

## 2016-02-15 ENCOUNTER — Encounter: Payer: Self-pay | Admitting: Physical Therapy

## 2016-02-15 DIAGNOSIS — M25511 Pain in right shoulder: Secondary | ICD-10-CM | POA: Diagnosis not present

## 2016-02-15 DIAGNOSIS — R293 Abnormal posture: Secondary | ICD-10-CM | POA: Diagnosis not present

## 2016-02-15 DIAGNOSIS — M6281 Muscle weakness (generalized): Secondary | ICD-10-CM | POA: Diagnosis not present

## 2016-02-15 DIAGNOSIS — M25611 Stiffness of right shoulder, not elsewhere classified: Secondary | ICD-10-CM

## 2016-02-15 NOTE — Patient Instructions (Addendum)
ROM: Flexion    Keeping left arm on table, slide body away until stretch is felt. Hold _1-2___ seconds. Repeat _10___ times per set. Do __1__ sets per session. Do _1-2___ sessions per day.  ROM: Abduction    With left arm resting on table, palm up, bring head down toward arm and simultaneously move trunk away from table. Hold _1-2___ seconds.  Repeat __10__ times per set. Do _1___ sets per session. Do __1-2__ sessions per day.   ROM: External Rotation    Keeping left forearm palm down on table, bend forward at waist until stretch is felt. Hold _1-2___ seconds. Repeat __10__ times per set. Do _1___ sets per session. Do 1-2____ sessions per day.  ROM: Flexion - Wand (Supine)    Lie on back holding wand. Raise arms over head.  Repeat __10__ times per set. Do __1__ sets per session. Do __1-2__ sessions per day.  ROM: Horizontal Abduction / Adduction - Wand - can have Rt palm facing up if palm down is uncomfortable.     Keeping both palms down, push right hand across body with other hand. Then pull back across body, keeping arms parallel to floor. Do not allow trunk to twist. Hold __1-2__ seconds. Repeat _10___ times per set. Do __1__ sets per session. Do _1-2___ sessions per day.  ROM: Extension - Wand (Standing), slowly bring hands closer together    Stand holding wand behind back. Raise arms as far as possible. Keep chest lifted, don't lean forward.  Repeat __10__ times per set. Do __1__ sets per session. Do _1-2___ sessions per day.  ROM: External / Internal Rotation - Wand    Holding wand with right hand palm up, push out from body with other hand. Keep both elbows bent and close to your body. When stretch is felt, hold _1-2___ seconds. Repeat to other side, leading with same hand. Keep elbows bent. Repeat __10__ times per set. Do __1__ sets per session. Do __1-2__ sessions per day.  Strengthening: Isometric Flexion      Using wall for resistance, press right fist  into ball using light pressure. Hold _5___ seconds. Repeat __5__ times per set. Do __1__ sets per session. Do __1__ sessions per day.  Extension (Isometric)  Place right bent elbow and back of arm against wall. Press elbow against wall. Hold _5___ seconds. Repeat __5__ times. Do __1__ sessions per day.  Internal Rotation (Isometric)  Place palm of right fist against door frame, with elbow bent. Press fist against door frame. Hold _5___ seconds. Repeat __5__ times. Do ___1_ sessions per day.  External Rotation (Isometric)  Place back of right fist against door frame, with elbow bent. Press fist against door frame. Hold __5__ seconds. Repeat __5__ times. Do __1__ sessions per day.  Copyright  VHI. All rights reserved.

## 2016-02-15 NOTE — Therapy (Signed)
[place], [place], [address] Phone: [phone]   Fax:  [phone]  Physical Therapy Evaluation  Patient Details  Name: Earl Thomas MRN: [id number] Date of Birth: 1950/08/12 Referring Provider: Dr Justice Britain  Encounter Date: 02/15/2016      PT End of Session - 02/15/16 1145    Visit Number 1   Number of Visits 16   Date for PT Re-Evaluation 04/11/16   PT Start Time 4944   PT Stop Time 1240   PT Time Calculation (min) 55 min   Activity Tolerance Patient tolerated treatment well      Past Medical History:  Diagnosis Date  . Arthritis   . Hyperlipidemia   . 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    Past Surgical History:  Procedure Laterality Date  . ROTATOR CUFF REPAIR Right 01/02/16    There were no vitals filed for this visit.       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 One day he felt a pop and had pain.  MRI showed tears inthe RTC Rt side. He had elective surgery 6 wks ago, and has been performing pendulums and circles. MD wanted him to really protect the repair.  Currently has to wear the sling in public, can have it off at home.    Diagnostic tests x-ray, MRI    Patient Stated Goals lift his arm over his head, carry items  - lift his bag ( ~ 20#) exercise at [employer].    Currently in Pain? No/denies  will have pain wen on the computer or certain movements.             [facility] PT Assessment - 02/15/16 0001      Assessment   Medical Diagnosis Rt RTC repair   Referring Provider Dr Justice Britain   Onset Date/Surgical Date 01/02/16   Hand Dominance Right   Next MD Visit 03/15/16   Prior Therapy none     Precautions   Precautions Shoulder   Type of Shoulder Precautions RTC surgery   Required Braces or Orthoses Sling  in  public     Balance Screen   Has the patient fallen in the past 6 months No   Has the patient had a decrease in activity level because of a fear of falling?  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    Leisure exercise     Observation/Other Assessments   Focus on Therapeutic Outcomes (FOTO)  54% limited     Posture/Postural Control   Posture/Postural Control Postural limitations   Postural Limitations Rounded Shoulders;Forward head     ROM / Strength   AROM / PROM / Strength AROM;PROM;Strength     PROM   PROM Assessment Site Shoulder   Right/Left Shoulder Right   Right Shoulder Flexion 128 Degrees   Right Shoulder ABduction 85 Degrees   Right Shoulder Internal Rotation 64 Degrees   Right Shoulder External Rotation 31 Degrees     Strength   Overall Strength Comments Lt UE WNL, Rt shoulder NA due to recent surgery, Rt elbow grossly 4/5  [place] [facility] Adult PT Treatment/Exercise - 02/15/16 0001      Exercises   Exercises Shoulder     Shoulder Exercises: ROM/Strengthening   Other ROM/Strengthening Exercises wand ex, flex/abduct/ER, table ex same three     Shoulder Exercises: Isometric Strengthening   Flexion 5X5"   Extension 5X5"   External Rotation 5X5"   Internal Rotation 5X5"     Modalities   Modalities Vasopneumatic     Vasopneumatic   Number Minutes Vasopneumatic  15 minutes   Vasopnuematic Location  Shoulder   Vasopneumatic Pressure Low   Vasopneumatic Temperature  3*                PT Education - 02/15/16 1216    Education provided Yes   Education Details HEP    Person(s) Educated Patient   Methods Explanation;Demonstration;Handout   Comprehension Returned demonstration;Verbalized understanding          PT Short Term Goals - 02/15/16 1232      PT SHORT TERM GOAL #1   Title I  with initial HEP ( 03/14/16)    Time 4   Period Weeks   Status New     PT SHORT TERM GOAL #2   Title demo Rt shoulder ROM WFL to perform upper body dressing per his previous level ( 03/14/16)    Time 4   Period Weeks   Status New     PT SHORT TERM GOAL #3   Title tolerate initial strengthening exercise Rt shoulder ( 03/14/16)    Time 4   Period Weeks   Status New     PT SHORT TERM GOAL #4   Title Rt shoulder pain =/< 2/10 with dailly activity ( 03/14/16)    Time 4   Period Weeks   Status New     PT SHORT TERM GOAL #5   Title improve FOTO =/< 45% limited ( 03/14/16)    Time 4   Period Weeks   Status New           PT Long Term Goals - 02/15/16 1234      PT LONG TERM GOAL #1   Title I with advanced HEP ( 04/11/16)    Time 8   Period Weeks   Status New     PT LONG TERM GOAL #2   Title demo Rt UE strength =/> 5-/5 to allow return to normal activities ( 04/11/16)    Time 8   Period Weeks   Status New     PT LONG TERM GOAL #3   Title lift his bag for work without difficulty ( 04/11/16)    Time 8   Period Weeks   Status New     PT LONG TERM GOAL #4   Title return to exercise at [employer] ( 04/11/16)    Time 8   Period Weeks   Status New               Plan - 02/15/16 1229    Clinical Impression Statement 65 yo male 6 wks s/p elective Rt shoulder TRC repair.  He has been immobilized in a sling and performing pendulums up until today.  Pain has been well controlled.  He has expected limited ROM in the shoulder, along with weakness in the Rt UE.  Patient also has rounded shoulders, FWD head positioning.    Rehab Potential Excellent   PT Frequency 2x / week   PT Duration 8 weeks   PT Treatment/Interventions Moist Heat;Ultrasound;Therapeutic exercise;Dry needling;Taping;Manual  techniques;Neuromuscular re-education;Cryotherapy;Electrical Stimulation;Iontophoresis 2m/ml Dexamethasone;Passive range of motion;Vasopneumatic Device   PT Next Visit Plan progress per  protocol, AAROM restore shoulder ROM, scapular stability   Consulted and Agree with Plan of Care Patient      Patient will benefit from skilled therapeutic intervention in order to improve the following deficits and impairments:  Postural dysfunction, Decreased strength, Pain, Impaired UE functional use, Decreased range of motion  Visit Diagnosis: Stiffness of right shoulder, not elsewhere classified - Plan: PT plan of care cert/re-cert  Muscle weakness (generalized) - Plan: PT plan of care cert/re-cert  Pain in right shoulder - Plan: PT plan of care cert/re-cert  Abnormal posture - Plan: PT plan of care cert/re-cert     Problem List Patient Active Problem List   Diagnosis Date Noted  . Hyperlipidemia 03/10/2012    SJeral PinchPT  02/15/2016, 12:38 PM  [facility] [address]Phone: [phone]  Fax:  [phone] Name: Earl WeisenselMRN: [id number] of Birth: 105-26-1952

## 2016-02-21 ENCOUNTER — Ambulatory Visit (INDEPENDENT_AMBULATORY_CARE_PROVIDER_SITE_OTHER): Payer: BLUE CROSS/BLUE SHIELD | Admitting: Physical Therapy

## 2016-02-21 DIAGNOSIS — M25611 Stiffness of right shoulder, not elsewhere classified: Secondary | ICD-10-CM | POA: Diagnosis not present

## 2016-02-21 DIAGNOSIS — M25511 Pain in right shoulder: Secondary | ICD-10-CM | POA: Diagnosis not present

## 2016-02-21 DIAGNOSIS — M6281 Muscle weakness (generalized): Secondary | ICD-10-CM | POA: Diagnosis not present

## 2016-02-21 DIAGNOSIS — R293 Abnormal posture: Secondary | ICD-10-CM

## 2016-02-21 NOTE — Therapy (Signed)
[place], [place], [address] Phone: [phone]   Fax:  [phone]  Physical Therapy Treatment  Patient Details  Name: Earl Thomas MRN: [id number] Date of Birth: Nov 18, 1950 Referring Provider: Dr Justice Britain  Encounter Date: 02/21/2016      PT End of Session - 02/21/16 1405    Visit Number 2   Number of Visits 16   Date for PT Re-Evaluation 04/11/16   PT Start Time 1400   PT Stop Time 1445   PT Time Calculation (min) 45 min   Activity Tolerance Patient tolerated treatment well      Past Medical History:  Diagnosis Date  . Arthritis   . Hyperlipidemia   . 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    Past Surgical History:  Procedure Laterality Date  . ROTATOR CUFF REPAIR Right 01/02/16    There were no vitals filed for this visit.      Subjective Assessment - 02/21/16 1406    Subjective Pt reports he has been compliant with HEP since last visit.  No new changes.    Currently in Pain? No/denies            [facility] PT Assessment - 02/21/16 0001      ROM / Strength   AROM / PROM / Strength AROM     AROM   AROM Assessment Site Shoulder   Right/Left Shoulder Right   Right Shoulder Extension 40 Degrees  AAROM in standing (cane)   Right Shoulder Flexion 141 Degrees  supine, AAROM with cane   Right Shoulder External Rotation 40 Degrees  supine 40 deg of abdct          [facility] Adult PT Treatment/Exercise - 02/21/16 0001      Shoulder Exercises: Supine   Horizontal ABduction AAROM;Right;10 reps  with cane, and horz addct, to tolerance    External Rotation AAROM;Right;10 reps   Flexion AAROM;Right;10 reps  with cane   Other Supine Exercises AAROM with cane forward flexion with elbow flex/ext x 10 reps      Shoulder Exercises: Standing   Flexion AAROM;Right;5 reps  wall wash   Extension AAROM;10 reps  with cane   Other Standing Exercises  Internal rotation, gentle AAROM with opposite UE assist behind the back, x 5 reps     Shoulder Exercises: Isometric Strengthening   Flexion 5X5"   Extension 5X5"   External Rotation 5X5"   Internal Rotation 5X5"     Shoulder Exercises: Stretch   Table Stretch - Flexion 5 reps   Table Stretch - External Rotation 5 reps     Vasopneumatic   Number Minutes Vasopneumatic  15 minutes   Vasopnuematic Location  Shoulder   Vasopneumatic Pressure Low   Vasopneumatic Temperature  3*                  PT Short Term Goals - 02/15/16 1232      PT SHORT TERM GOAL #1   Title I with initial HEP ( 03/14/16)    Time 4   Period Weeks   Status New     PT SHORT TERM GOAL #2   Title demo Rt shoulder ROM WFL to perform upper body dressing per his previous level ( 03/14/16)    Time 4   Period Weeks   Status New     PT SHORT TERM GOAL #3   Title tolerate initial strengthening exercise Rt shoulder ( 03/14/16)  Time 4   Period Weeks   Status New     PT SHORT TERM GOAL #4   Title Rt shoulder pain =/< 2/10 with dailly activity ( 03/14/16)    Time 4   Period Weeks   Status New     PT SHORT TERM GOAL #5   Title improve FOTO =/< 45% limited ( 03/14/16)    Time 4   Period Weeks   Status New           PT Long Term Goals - 02/15/16 1234      PT LONG TERM GOAL #1   Title I with advanced HEP ( 04/11/16)    Time 8   Period Weeks   Status New     PT LONG TERM GOAL #2   Title demo Rt UE strength =/> 5-/5 to allow return to normal activities ( 04/11/16)    Time 8   Period Weeks   Status New     PT LONG TERM GOAL #3   Title lift his bag for work without difficulty ( 04/11/16)    Time 8   Period Weeks   Status New     PT LONG TERM GOAL #4   Title return to exercise at [employer] ( 04/11/16)    Time 8   Period Weeks   Status New               Plan - 02/21/16 1431    Clinical Impression Statement Pt demonstrated improvement in Rt shoulder ROM.  Pt had good form with  HEP exercises, minor VC to correct form.  Pt tolerated all exercises with minimal discomfort in Rt shoulder; reduced with use of vaso at end of session.    Rehab Potential Excellent   PT Frequency 2x / week   PT Duration 8 weeks   PT Treatment/Interventions Moist Heat;Ultrasound;Therapeutic exercise;Dry needling;Taping;Manual techniques;Neuromuscular re-education;Cryotherapy;Electrical Stimulation;Iontophoresis 25m/ml Dexamethasone;Passive range of motion;Vasopneumatic Device   PT Next Visit Plan progress per protocol, AAROM restore shoulder ROM, scapular stability   Consulted and Agree with Plan of Care Patient      Patient will benefit from skilled therapeutic intervention in order to improve the following deficits and impairments:  Postural dysfunction, Decreased strength, Pain, Impaired UE functional use, Decreased range of motion  Visit Diagnosis: Stiffness of right shoulder, not elsewhere classified  Muscle weakness (generalized)  Pain in right shoulder  Abnormal posture     Problem List Patient Active Problem List   Diagnosis Date Noted  . Hyperlipidemia 03/10/2012   JKerin Perna PTA 02/21/16 2:33 PM  [place] [address]Phone: [phone]  Fax:  [phone] Name: MTorrence HammackMRN: [id number] of Birth: 1Feb 17, 1952

## 2016-02-23 ENCOUNTER — Ambulatory Visit (INDEPENDENT_AMBULATORY_CARE_PROVIDER_SITE_OTHER): Payer: BLUE CROSS/BLUE SHIELD | Admitting: Rehabilitative and Restorative Service Providers"

## 2016-02-23 ENCOUNTER — Encounter: Payer: Self-pay | Admitting: Rehabilitative and Restorative Service Providers"

## 2016-02-23 DIAGNOSIS — M25611 Stiffness of right shoulder, not elsewhere classified: Secondary | ICD-10-CM

## 2016-02-23 DIAGNOSIS — M25511 Pain in right shoulder: Secondary | ICD-10-CM

## 2016-02-23 DIAGNOSIS — R293 Abnormal posture: Secondary | ICD-10-CM

## 2016-02-23 DIAGNOSIS — M6281 Muscle weakness (generalized): Secondary | ICD-10-CM | POA: Diagnosis not present

## 2016-02-23 NOTE — Patient Instructions (Signed)
Self massage using a 4 inch rubber ball   Shoulder Blade Squeeze   Can use swim noodle to help with posture  Rotate shoulders back, then squeeze shoulder blades down and back. Hold 10 sec Repeat _10___ times. Do __several__ sessions per day.

## 2016-02-23 NOTE — Therapy (Signed)
[place], [place], [address] Phone: [phone]   Fax:  [phone]  Physical Therapy Treatment  Patient Details  Name: Earl Thomas MRN: [id number] Date of Birth: 02/22/1951 Referring Provider: Dr Justice Britain  Encounter Date: 02/23/2016      PT End of Session - 02/23/16 1405    Visit Number 3   Number of Visits 16   Date for PT Re-Evaluation 04/11/16   PT Start Time 8115   PT Stop Time 1450   PT Time Calculation (min) 48 min   Activity Tolerance Patient tolerated treatment well      Past Medical History:  Diagnosis Date  . Arthritis   . Hyperlipidemia   . 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    Past Surgical History:  Procedure Laterality Date  . ROTATOR CUFF REPAIR Right 01/02/16    There were no vitals filed for this visit.      Subjective Assessment - 02/23/16 1407    Subjective Patient reports that his shoulder is progressing "good". A little pain today from being on the computer   Currently in Pain? Yes   Pain Score 5    Pain Location Shoulder   Pain Orientation Right   Pain Descriptors / Indicators Sharp   Pain Type Acute pain   Pain Onset Today   Pain Frequency Intermittent   Aggravating Factors  working on the computer                          [facility] Adult PT Treatment/Exercise - 02/23/16 0001      Therapeutic Activites    Therapeutic Activities --  self massage using a 4" rubber ball      Neuro Re-ed    Neuro Re-ed Details  working on posture and alignment engaging posterior shoulder girdle      Exercises   Exercises --  pendulum 30 CW/30 CCW      Shoulder Exercises: Standing   External Rotation AAROM;10 reps  10 sec hold    Extension AAROM;10 reps  with cane   Other Standing Exercises Internal rotation AAROM with opposite UE assist behind the back, x 5 reps   Other Standing Exercises scap squeeze with  noodle working on posture 10 sec x 10      Shoulder Exercises: Pulleys   Flexion --  10 sec x 10    ABduction --  10 sec x 10      Vasopneumatic   Number Minutes Vasopneumatic  15 minutes   Vasopnuematic Location  Shoulder   Vasopneumatic Pressure Low   Vasopneumatic Temperature  3*     Manual Therapy   Manual therapy comments pt supine    Joint Mobilization Rt GH joint    Soft tissue mobilization pecs/trap/leveator/scapular area    Scapular Mobilization Rt   Passive ROM Rt shoudler flex/abd/ER/IR                 PT Education - 02/23/16 1445    Education provided Yes   Education Details HEP    Person(s) Educated Patient   Methods Explanation;Demonstration;Tactile cues;Verbal cues;Handout   Comprehension Verbalized understanding;Returned demonstration;Verbal cues required;Tactile cues required          PT Short Term Goals - 02/23/16 1404      PT SHORT TERM GOAL #1   Title I with initial HEP ( 03/14/16)    Time 4   Period Weeks  Status On-going     PT SHORT TERM GOAL #2   Title demo Rt shoulder ROM WFL to perform upper body dressing per his previous level ( 03/14/16)    Time 4   Period Weeks   Status On-going     PT SHORT TERM GOAL #3   Title tolerate initial strengthening exercise Rt shoulder ( 03/14/16)    Time 4   Period Weeks   Status On-going     PT SHORT TERM GOAL #4   Title Rt shoulder pain =/< 2/10 with dailly activity ( 03/14/16)    Time 4   Period Weeks   Status On-going     PT SHORT TERM GOAL #5   Title improve FOTO =/< 45% limited ( 03/14/16)    Time 4   Period Weeks   Status On-going           PT Long Term Goals - 02/23/16 1403      PT LONG TERM GOAL #1   Title I with advanced HEP ( 04/11/16)    Time 8   Period Weeks   Status On-going     PT LONG TERM GOAL #2   Title demo Rt UE strength =/> 5-/5 to allow return to normal activities ( 04/11/16)    Time 8   Period Weeks   Status On-going     PT LONG TERM GOAL #3   Title lift  his bag for work without difficulty ( 04/11/16)    Time 8   Period Weeks   Status On-going     PT LONG TERM GOAL #4   Title return to exercise at [employer] ( 04/11/16)    Time 8   Period Weeks   Status On-going             Patient will benefit from skilled therapeutic intervention in order to improve the following deficits and impairments:     Visit Diagnosis: Stiffness of right shoulder, not elsewhere classified  Muscle weakness (generalized)  Pain in right shoulder  Abnormal posture     Problem List Patient Active Problem List   Diagnosis Date Noted  . Hyperlipidemia 03/10/2012    Ainsleigh Kakos Nilda Simmer PT, MPH  02/23/2016, 2:46 PM  [facility] [place], [place], [address] Phone: [phone]   Fax:  [phone]  Name: Earl Thomas MRN: [id number] Date of Birth: 1951-06-27

## 2016-02-26 ENCOUNTER — Ambulatory Visit (INDEPENDENT_AMBULATORY_CARE_PROVIDER_SITE_OTHER): Payer: BLUE CROSS/BLUE SHIELD | Admitting: Physical Therapy

## 2016-02-26 DIAGNOSIS — R293 Abnormal posture: Secondary | ICD-10-CM | POA: Diagnosis not present

## 2016-02-26 DIAGNOSIS — M25511 Pain in right shoulder: Secondary | ICD-10-CM | POA: Diagnosis not present

## 2016-02-26 DIAGNOSIS — M25611 Stiffness of right shoulder, not elsewhere classified: Secondary | ICD-10-CM

## 2016-02-26 DIAGNOSIS — M6281 Muscle weakness (generalized): Secondary | ICD-10-CM | POA: Diagnosis not present

## 2016-02-26 NOTE — Therapy (Signed)
[place], [place], [address] Phone: [phone]   Fax:  [phone]  Physical Therapy Treatment  Patient Details  Name: Earl Thomas MRN: [id number] Date of Birth: Dec 11, 1950 Referring Provider: Dr. Justice Britain   Encounter Date: 02/26/2016      PT End of Session - 02/26/16 0722    Visit Number 4   Number of Visits 16   Date for PT Re-Evaluation 04/11/16   PT Start Time 0720   PT Stop Time 0810   PT Time Calculation (min) 50 min   Activity Tolerance Patient tolerated treatment well;No increased pain      Past Medical History:  Diagnosis Date  . Arthritis   . Hyperlipidemia   . 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    Past Surgical History:  Procedure Laterality Date  . ROTATOR CUFF REPAIR Right 01/02/16    There were no vitals filed for this visit.      Subjective Assessment - 02/26/16 0723    Subjective Pt reports he was stiff this morning, but his Rt shoulder loosened up in shower with wall walking.  He returns to work today.    Patient Stated Goals lift his arm over his head, carry items  - lift his bag ( ~ 20#) exercise at [employer].    Currently in Pain? No/denies   Pain Score 0-No pain   Pain Location Shoulder            [facility] PT Assessment - 02/26/16 0001      Assessment   Medical Diagnosis Rt RTC repair   Referring Provider Dr. Justice Britain    Onset Date/Surgical Date 01/02/16   Hand Dominance Right   Next MD Visit 03/15/16   Prior Therapy none     ROM / Strength   AROM / PROM / Strength AROM;Strength     AROM   AROM Assessment Site Shoulder   Right/Left Shoulder Right   Right Shoulder Flexion 145 Degrees  supine, AAROM with cane   Right Shoulder External Rotation 55 Degrees  supine, shoulder abdct to 40 deg (with cane_     Strength   Strength Assessment Site Hand   Right/Left hand Right;Left   Right Hand Grip (lbs)  65   Left Hand Grip (lbs) 55           [facility] Adult PT Treatment/Exercise - 02/26/16 0001      Shoulder Exercises: Supine   Horizontal ABduction AAROM;Right;10 reps  with cane, and horz addct, to tolerance    External Rotation AAROM;Right;10 reps  held 15 sec    Flexion AAROM;Right;5 reps  with cane     Shoulder Exercises: Standing   Flexion Right;AROM  8 reps, to ~80 deg with mirror for visual feedback   ABduction Right;AROM  8 reps, to ~80 deg with mirror for visual feedback (scaption   Extension AAROM;10 reps  with cane   Other Standing Exercises wall ladder RUE (up to #27) x 5 reps     Shoulder Exercises: Pulleys   Flexion --  10 sec x 10    ABduction --  10 sec x 10      Shoulder Exercises: Stretch   Internal Rotation Stretch --  10 reps, 2 sec.      Vasopneumatic   Number Minutes Vasopneumatic  15 minutes   Vasopnuematic Location  Shoulder   Vasopneumatic Pressure Low   Vasopneumatic Temperature  3*     Manual  Therapy   Manual Therapy Myofascial release;Passive ROM   Manual therapy comments pt supine    Myofascial Release Rt pec, Rt bicep at elbow.    Passive ROM Rt shoulder ER                   PT Short Term Goals - 02/23/16 1404      PT SHORT TERM GOAL #1   Title I with initial HEP ( 03/14/16)    Time 4   Period Weeks   Status On-going     PT SHORT TERM GOAL #2   Title demo Rt shoulder ROM WFL to perform upper body dressing per his previous level ( 03/14/16)    Time 4   Period Weeks   Status On-going     PT SHORT TERM GOAL #3   Title tolerate initial strengthening exercise Rt shoulder ( 03/14/16)    Time 4   Period Weeks   Status On-going     PT SHORT TERM GOAL #4   Title Rt shoulder pain =/< 2/10 with dailly activity ( 03/14/16)    Time 4   Period Weeks   Status On-going     PT SHORT TERM GOAL #5   Title improve FOTO =/< 45% limited ( 03/14/16)    Time 4   Period Weeks   Status On-going           PT Long Term Goals -  02/23/16 1403      PT LONG TERM GOAL #1   Title I with advanced HEP ( 04/11/16)    Time 8   Period Weeks   Status On-going     PT LONG TERM GOAL #2   Title demo Rt UE strength =/> 5-/5 to allow return to normal activities ( 04/11/16)    Time 8   Period Weeks   Status On-going     PT LONG TERM GOAL #3   Title lift his bag for work without difficulty ( 04/11/16)    Time 8   Period Weeks   Status On-going     PT LONG TERM GOAL #4   Title return to exercise at [employer] ( 04/11/16)    Time 8   Period Weeks   Status On-going               Plan - 02/26/16 1412    Clinical Impression Statement Pt demonstrating improved Rt shoulder ROM each visit.  He is tolerating exercises well, with minimal increase in pain.  Pain reduced with use of vaso at end of session.     Rehab Potential Excellent   PT Frequency 2x / week   PT Duration 8 weeks   PT Treatment/Interventions Moist Heat;Ultrasound;Therapeutic exercise;Dry needling;Taping;Manual techniques;Neuromuscular re-education;Cryotherapy;Electrical Stimulation;Iontophoresis 64m/ml Dexamethasone;Passive range of motion;Vasopneumatic Device   PT Next Visit Plan progress per protocol, AAROM restore shoulder ROM, scapular stability   Consulted and Agree with Plan of Care Patient      Patient will benefit from skilled therapeutic intervention in order to improve the following deficits and impairments:  Postural dysfunction, Decreased strength, Pain, Impaired UE functional use, Decreased range of motion  Visit Diagnosis: Stiffness of right shoulder, not elsewhere classified  Muscle weakness (generalized)  Pain in right shoulder  Abnormal posture     Problem List Patient Active Problem List   Diagnosis Date Noted  . Hyperlipidemia 03/10/2012   JKerin Perna PTA 02/26/16 2:13 PM  [facility] [address] [address]  [place], [place], [address] Phone: [phone]    Fax:  [phone]  Name: Earl Thomas MRN: [id number] Date of Birth: 07-Jul-1951

## 2016-02-27 ENCOUNTER — Ambulatory Visit: Payer: BLUE CROSS/BLUE SHIELD | Admitting: Cardiology

## 2016-02-29 ENCOUNTER — Ambulatory Visit (INDEPENDENT_AMBULATORY_CARE_PROVIDER_SITE_OTHER): Payer: BLUE CROSS/BLUE SHIELD | Admitting: Physical Therapy

## 2016-02-29 DIAGNOSIS — M6281 Muscle weakness (generalized): Secondary | ICD-10-CM | POA: Diagnosis not present

## 2016-02-29 DIAGNOSIS — R293 Abnormal posture: Secondary | ICD-10-CM | POA: Diagnosis not present

## 2016-02-29 DIAGNOSIS — M25511 Pain in right shoulder: Secondary | ICD-10-CM | POA: Diagnosis not present

## 2016-02-29 DIAGNOSIS — M25611 Stiffness of right shoulder, not elsewhere classified: Secondary | ICD-10-CM

## 2016-02-29 NOTE — Therapy (Signed)
[facility] [place], [place], [address] Phone: [phone]   Fax:  [phone]  Physical Therapy Treatment  Patient Details  Name: Earl Thomas MRN: [id number] Date of Birth: Jul 22, 1950 Referring Provider: Dr. Justice Britain  Encounter Date: 02/29/2016      PT End of Session - 02/29/16 1235    Visit Number 5   Number of Visits 16   Date for PT Re-Evaluation 04/11/16   PT Start Time 1150   PT Stop Time 1245   PT Time Calculation (min) 55 min   Activity Tolerance Patient tolerated treatment well      Past Medical History:  Diagnosis Date  . Arthritis   . Hyperlipidemia   . 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    Past Surgical History:  Procedure Laterality Date  . ROTATOR CUFF REPAIR Right 01/02/16    There were no vitals filed for this visit.      Subjective Assessment - 02/29/16 1243    Subjective Pt reports no new changes since last visit.  He states he has been consistent with HEP.    Currently in Pain? No/denies            [facility] PT Assessment - 02/29/16 0001      Assessment   Medical Diagnosis Rt RTC repair   Referring Provider Dr. Justice Britain   Onset Date/Surgical Date 01/02/16   Hand Dominance Right   Next MD Visit 03/15/16   Prior Therapy none     AROM   AROM Assessment Site Shoulder   Right/Left Shoulder Right   Right Shoulder Flexion 120 Degrees  standing    Right Shoulder ABduction 110 Degrees  scaption, standing                     [facility] Adult PT Treatment/Exercise - 02/29/16 0001      Elbow Exercises   Elbow Flexion Right;Standing;15 reps  5 reps yellow, switched to 2# wt.      Shoulder Exercises: Prone   Retraction Right;5 reps  5 sec hold   Extension Strengthening;Right;10 reps  with scap retraction.   Horizontal ABduction 1 Right;10 reps  palm down   Other Prone Exercises prone Rt shoulder row with scap  retraction x 10      Shoulder Exercises: Standing   Flexion Right;AROM  8 reps, to ~110 deg with mirror for visual feedback   ABduction Right;AROM  8 reps, to ~110 deg with mirror. scaption.    Other Standing Exercises reverse push ups (back against wall, pushing elbows into wall) x 10      Shoulder Exercises: Pulleys   Flexion --  10 sec x 10    ABduction --  10 sec x 10      Shoulder Exercises: Stretch   Internal Rotation Stretch   External rotation stretch  --  10 reps, 2 sec.   (hands behind head in supine x 5 reps of 15 sec)     Vasopneumatic   Number Minutes Vasopneumatic  15 minutes   Vasopnuematic Location  Shoulder   Vasopneumatic Pressure Low   Vasopneumatic Temperature  3*     Manual Therapy   Manual Therapy Myofascial release;Soft tissue mobilization;Passive ROM   Soft tissue mobilization to Rt lat, subscap, upper trap    Myofascial Release Rt pec release.    Passive ROM Rt shoulder ER  PT Short Term Goals - 02/23/16 1404      PT SHORT TERM GOAL #1   Title I with initial HEP ( 03/14/16)    Time 4   Period Weeks   Status On-going     PT SHORT TERM GOAL #2   Title demo Rt shoulder ROM WFL to perform upper body dressing per his previous level ( 03/14/16)    Time 4   Period Weeks   Status On-going     PT SHORT TERM GOAL #3   Title tolerate initial strengthening exercise Rt shoulder ( 03/14/16)    Time 4   Period Weeks   Status On-going     PT SHORT TERM GOAL #4   Title Rt shoulder pain =/< 2/10 with dailly activity ( 03/14/16)    Time 4   Period Weeks   Status On-going     PT SHORT TERM GOAL #5   Title improve FOTO =/< 45% limited ( 03/14/16)    Time 4   Period Weeks   Status On-going           PT Long Term Goals - 02/23/16 1403      PT LONG TERM GOAL #1   Title I with advanced HEP ( 04/11/16)    Time 8   Period Weeks   Status On-going     PT LONG TERM GOAL #2   Title demo Rt UE strength =/> 5-/5 to allow  return to normal activities ( 04/11/16)    Time 8   Period Weeks   Status On-going     PT LONG TERM GOAL #3   Title lift his bag for work without difficulty ( 04/11/16)    Time 8   Period Weeks   Status On-going     PT LONG TERM GOAL #4   Title return to exercise at [employer] ( 04/11/16)    Time 8   Period Weeks   Status On-going               Plan - 02/29/16 1235    Clinical Impression Statement Pt now 8 wks status post Rotator cuff repair. Pt reported increase in Rt shoulder pain to 4/10 with AROM; reduced with rest.  Pt tolerated new prone and AROM exercises well.  Pt reported pain further reduced with use of vaso at end of session.  Pt progressing well towards goals.    Rehab Potential Excellent   PT Frequency 2x / week   PT Duration 8 weeks   PT Treatment/Interventions Moist Heat;Ultrasound;Therapeutic exercise;Dry needling;Taping;Manual techniques;Neuromuscular re-education;Cryotherapy;Electrical Stimulation;Iontophoresis 93m/ml Dexamethasone;Passive range of motion;Vasopneumatic Device   PT Next Visit Plan Continue progressive Rt shoulder ROM, scapular stability.  Progress per protocol.    Consulted and Agree with Plan of Care Patient      Patient will benefit from skilled therapeutic intervention in order to improve the following deficits and impairments:  Postural dysfunction, Decreased strength, Pain, Impaired UE functional use, Decreased range of motion  Visit Diagnosis: Stiffness of right shoulder, not elsewhere classified  Muscle weakness (generalized)  Pain in right shoulder  Abnormal posture     Problem List Patient Active Problem List   Diagnosis Date Noted  . Hyperlipidemia 03/10/2012   JKerin Perna PTA 02/29/16 12:43 PM  [place] [address]Phone: [phone]  Fax:  [phone] Name: MRachael ZapantaMRN: [id number] of Birth:  1March 23, 1952

## 2016-03-04 ENCOUNTER — Ambulatory Visit (INDEPENDENT_AMBULATORY_CARE_PROVIDER_SITE_OTHER): Payer: BLUE CROSS/BLUE SHIELD | Admitting: Physical Therapy

## 2016-03-04 DIAGNOSIS — R293 Abnormal posture: Secondary | ICD-10-CM

## 2016-03-04 DIAGNOSIS — M25511 Pain in right shoulder: Secondary | ICD-10-CM | POA: Diagnosis not present

## 2016-03-04 DIAGNOSIS — M25611 Stiffness of right shoulder, not elsewhere classified: Secondary | ICD-10-CM

## 2016-03-04 DIAGNOSIS — M6281 Muscle weakness (generalized): Secondary | ICD-10-CM | POA: Diagnosis not present

## 2016-03-04 NOTE — Therapy (Signed)
[place], [place], [address] Phone: [phone]   Fax:  [phone]  Physical Therapy Treatment  Patient Details  Name: Earl Thomas MRN: [id number] Date of Birth: 11-Oct-1950 Referring Provider: Dr. Justice Britain   Encounter Date: 03/04/2016      PT End of Session - 03/04/16 1608    Visit Number 6   Number of Visits 16   Date for PT Re-Evaluation 04/11/16   PT Start Time 6546   PT Stop Time 1656   PT Time Calculation (min) 51 min   Activity Tolerance Patient tolerated treatment well;No increased pain      Past Medical History:  Diagnosis Date  . Arthritis   . Hyperlipidemia   . 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    Past Surgical History:  Procedure Laterality Date  . ROTATOR CUFF REPAIR Right 01/02/16    There were no vitals filed for this visit.      Subjective Assessment - 03/04/16 1608    Subjective Pt reports he feels his Rt shoulder is loosening up more. He has been consistent with HEP.    Patient Stated Goals lift his arm over his head, carry items  - lift his bag ( ~ 20#) exercise at [employer].    Currently in Pain? No/denies            [facility] PT Assessment - 03/04/16 0001      Assessment   Medical Diagnosis Rt RTC repair   Referring Provider Dr. Justice Britain    Onset Date/Surgical Date 01/02/16   Hand Dominance Right   Next MD Visit 03/15/16   Prior Therapy none     AROM   Right Shoulder Flexion --  135 deg. supine    Right Shoulder External Rotation 72 Degrees  supine, shoulder abdct 70 deg           [facility] Adult PT Treatment/Exercise - 03/04/16 0001      Shoulder Exercises: Prone   Extension Strengthening;Right;10 reps  with scap retraction. 2 sets   Horizontal ABduction 1 Right;10 reps  palm down   Other Prone Exercises prone Rt shoulder row with scap retraction x 10     Shoulder Exercises: Sidelying   External  Rotation AROM;Right;10 reps  2 sets, then 3rd set, 5 reps with 1#    ABduction Strengthening;Right;5 reps;Weights   ABduction Weight (lbs) 1#     Shoulder Exercises: Standing   Flexion Right;AROM  10 reps, to ~123 deg with mirror for visual feedback   ABduction Right;AROM  10 reps, to ~112 deg with mirror. scaption.    Extension Strengthening;10 reps;Theraband   Theraband Level (Shoulder Extension) Level 1 (Yellow)   Row 10 reps;Strengthening;Theraband   Theraband Level (Shoulder Row) Level 1 (Yellow)   Retraction Both;Theraband;10 reps  with pool noodle.    Theraband Level (Shoulder Retraction) Level 1 (Yellow)   Other Standing Exercises wall ladder RUE in flexion and scaption (up to #27) x 5 reps each     Shoulder Exercises: ROM/Strengthening   Rhythmic Stabilization, Supine 45 deg flexion  x 2 reps; 90 deg flexion x 30 sec x 2;  ~105 deg flexion x 30 sec x 2     Vasopneumatic   Number Minutes Vasopneumatic  15 minutes   Vasopnuematic Location  Shoulder   Vasopneumatic Pressure Low   Vasopneumatic Temperature  3*  PT Short Term Goals - 02/23/16 1404      PT SHORT TERM GOAL #1   Title I with initial HEP ( 03/14/16)    Time 4   Period Weeks   Status On-going     PT SHORT TERM GOAL #2   Title demo Rt shoulder ROM WFL to perform upper body dressing per his previous level ( 03/14/16)    Time 4   Period Weeks   Status On-going     PT SHORT TERM GOAL #3   Title tolerate initial strengthening exercise Rt shoulder ( 03/14/16)    Time 4   Period Weeks   Status On-going     PT SHORT TERM GOAL #4   Title Rt shoulder pain =/< 2/10 with dailly activity ( 03/14/16)    Time 4   Period Weeks   Status On-going     PT SHORT TERM GOAL #5   Title improve FOTO =/< 45% limited ( 03/14/16)    Time 4   Period Weeks   Status On-going           PT Long Term Goals - 02/23/16 1403      PT LONG TERM GOAL #1   Title I with advanced HEP ( 04/11/16)    Time 8    Period Weeks   Status On-going     PT LONG TERM GOAL #2   Title demo Rt UE strength =/> 5-/5 to allow return to normal activities ( 04/11/16)    Time 8   Period Weeks   Status On-going     PT LONG TERM GOAL #3   Title lift his bag for work without difficulty ( 04/11/16)    Time 8   Period Weeks   Status On-going     PT LONG TERM GOAL #4   Title return to exercise at [employer] ( 04/11/16)    Time 8   Period Weeks   Status On-going               Plan - 03/04/16 1646    Clinical Impression Statement Pt tolerated all exercises well, with minimal to no pain.  His AROM in his Rt shoulder is improving.  Pt is demonstrating good scapular retraction during exercises with minimal cues.  Pt progressing towards goals.    Rehab Potential Excellent   PT Frequency 2x / week   PT Duration 8 weeks   PT Treatment/Interventions Moist Heat;Ultrasound;Therapeutic exercise;Dry needling;Taping;Manual techniques;Neuromuscular re-education;Cryotherapy;Electrical Stimulation;Iontophoresis 66m/ml Dexamethasone;Passive range of motion;Vasopneumatic Device   PT Next Visit Plan Continue progressive Rt shoulder ROM, scapular stability.  Progress per protocol.    Consulted and Agree with Plan of Care Patient      Patient will benefit from skilled therapeutic intervention in order to improve the following deficits and impairments:  Postural dysfunction, Decreased strength, Pain, Impaired UE functional use, Decreased range of motion  Visit Diagnosis: Stiffness of right shoulder, not elsewhere classified  Muscle weakness (generalized)  Pain in right shoulder  Abnormal posture     Problem List Patient Active Problem List   Diagnosis Date Noted  . Hyperlipidemia 03/10/2012   JKerin Perna PTA 03/04/16 4:49 PM  [place] [address]Phone: [phone]  Fax:  [phone] Name: Earl LodgeMRN: [id number] of Birth: 112-10-1950

## 2016-03-08 ENCOUNTER — Ambulatory Visit (INDEPENDENT_AMBULATORY_CARE_PROVIDER_SITE_OTHER): Payer: BLUE CROSS/BLUE SHIELD | Admitting: Physical Therapy

## 2016-03-08 DIAGNOSIS — M25611 Stiffness of right shoulder, not elsewhere classified: Secondary | ICD-10-CM

## 2016-03-08 DIAGNOSIS — M6281 Muscle weakness (generalized): Secondary | ICD-10-CM

## 2016-03-08 DIAGNOSIS — R293 Abnormal posture: Secondary | ICD-10-CM

## 2016-03-08 DIAGNOSIS — M25511 Pain in right shoulder: Secondary | ICD-10-CM

## 2016-03-08 NOTE — Therapy (Signed)
[place], [place], [address] Phone: [phone]   Fax:  [phone]  Physical Therapy Treatment  Patient Details  Name: Earl Thomas MRN: [id number] Date of Birth: 03-25-1951 Referring Provider: Dr. Justice Britain   Encounter Date: 03/08/2016      PT End of Session - 03/08/16 1532    Visit Number 7   Number of Visits 16   Date for PT Re-Evaluation 04/11/16   PT Start Time 8676   PT Stop Time 1625   PT Time Calculation (min) 55 min   Activity Tolerance Patient tolerated treatment well      Past Medical History:  Diagnosis Date  . Arthritis   . Hyperlipidemia   . 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    Past Surgical History:  Procedure Laterality Date  . ROTATOR CUFF REPAIR Right 01/02/16    There were no vitals filed for this visit.      Subjective Assessment - 03/08/16 1533    Subjective Pt reports he was using Rt hand for scratch off and after performing that action his Rt shoulder began to hurt (6/10). He iced and took pain medicine; pain resolved.  Otherwise no new changes.    Patient Stated Goals lift his arm over his head, carry items  - lift his bag ( ~ 20#) exercise at [employer].    Currently in Pain? No/denies            [facility] Adult PT Treatment/Exercise - 03/08/16 0001      Shoulder Exercises: Supine   Other Supine Exercises 90 deg flexion:  small circles CW/CCW with fist x 10 reps each, 2 sets, horiz abdct/add (small range) x 10 reps x 2 sets, small x's - 15 reps.   Shoulder at 45 deg - small circles CW/CCW x 15 reps .      Shoulder Exercises: Prone   Flexion Right;AROM;10 reps   Extension Strengthening;Right;10 reps  with scap retraction. 2 sets   Horizontal ABduction 1 Right;10 reps  palm down   Other Prone Exercises prone Rt shoulder row with scap retraction x 10     Shoulder Exercises: Pulleys   Flexion 2 minutes   ABduction 2 minutes  scaption      Shoulder Exercises: Therapy Ball   Flexion 10 reps  CW/CCW, 2 sets, ~80 deg    ABduction 10 reps  scaption, CW/CCW x 2 sets, ~80 deg     Shoulder Exercises: Stretch   Internal Rotation Stretch --  10 reps @ 10 sec.  AAROM with LUE assisting behind back     Vasopneumatic   Number Minutes Vasopneumatic  15 minutes   Vasopnuematic Location  Shoulder   Vasopneumatic Pressure Low   Vasopneumatic Temperature  3*     Manual Therapy   Soft tissue mobilization to Rt lat, subscap, upper trap    Myofascial Release Rt pec release.    Passive ROM Rt shoulder ER, scaption, flexion                  PT Short Term Goals - 03/08/16 1621      PT SHORT TERM GOAL #1   Title I with initial HEP ( 03/14/16)    Time 4   Period Weeks   Status Achieved     PT SHORT TERM GOAL #2   Title demo Rt shoulder ROM WFL to perform upper body dressing per his previous level ( 03/14/16)  Time 4   Period Weeks   Status Achieved     PT SHORT TERM GOAL #3   Title tolerate initial strengthening exercise Rt shoulder ( 03/14/16)    Time 4   Period Weeks   Status Achieved     PT SHORT TERM GOAL #4   Title Rt shoulder pain =/< 2/10 with dailly activity ( 03/14/16)    Time 4   Period Weeks   Status Achieved     PT SHORT TERM GOAL #5   Title improve FOTO =/< 45% limited ( 03/14/16)    Time 4   Period Weeks   Status On-going           PT Long Term Goals - 02/23/16 1403      PT LONG TERM GOAL #1   Title I with advanced HEP ( 04/11/16)    Time 8   Period Weeks   Status On-going     PT LONG TERM GOAL #2   Title demo Rt UE strength =/> 5-/5 to allow return to normal activities ( 04/11/16)    Time 8   Period Weeks   Status On-going     PT LONG TERM GOAL #3   Title lift his bag for work without difficulty ( 04/11/16)    Time 8   Period Weeks   Status On-going     PT LONG TERM GOAL #4   Title return to exercise at [employer] ( 04/11/16)    Time 8    Period Weeks   Status On-going               Plan - 03/08/16 1549    Clinical Impression Statement Pt tolerated all new exercises well, with minimal to no pain.  AROM in Rt shoulder improving.  Pt has met STG 1, 2, 4.     Rehab Potential Excellent   PT Frequency 2x / week   PT Duration 8 weeks   PT Treatment/Interventions Moist Heat;Ultrasound;Therapeutic exercise;Dry needling;Taping;Manual techniques;Neuromuscular re-education;Cryotherapy;Electrical Stimulation;Iontophoresis 36m/ml Dexamethasone;Passive range of motion;Vasopneumatic Device   PT Next Visit Plan MD note, FOTO (MD follow up on Friday).  Continue progressive shoulder ROM, scapular stability.    Consulted and Agree with Plan of Care Patient      Patient will benefit from skilled therapeutic intervention in order to improve the following deficits and impairments:  Postural dysfunction, Decreased strength, Pain, Impaired UE functional use, Decreased range of motion  Visit Diagnosis: Stiffness of right shoulder, not elsewhere classified  Muscle weakness (generalized)  Pain in right shoulder  Abnormal posture     Problem List Patient Active Problem List   Diagnosis Date Noted  . Hyperlipidemia 03/10/2012   JKerin Perna PTA 03/08/16 4:22 PM  [place] [address]Phone: [phone]  Fax:  [phone] Name: Earl FurryMRN: [id number] of Birth: 103/29/1952

## 2016-03-13 ENCOUNTER — Ambulatory Visit (INDEPENDENT_AMBULATORY_CARE_PROVIDER_SITE_OTHER): Payer: BLUE CROSS/BLUE SHIELD | Admitting: Physical Therapy

## 2016-03-13 DIAGNOSIS — M6281 Muscle weakness (generalized): Secondary | ICD-10-CM

## 2016-03-13 DIAGNOSIS — M25511 Pain in right shoulder: Secondary | ICD-10-CM

## 2016-03-13 DIAGNOSIS — M25611 Stiffness of right shoulder, not elsewhere classified: Secondary | ICD-10-CM

## 2016-03-13 DIAGNOSIS — R293 Abnormal posture: Secondary | ICD-10-CM

## 2016-03-13 NOTE — Therapy (Signed)
[place] [address]  [place], [place], [address] Phone: [phone]   Fax:  [phone]  Physical Therapy Treatment  Patient Details  Name: Earl Thomas MRN: [id number] Date of Birth: 1951/07/05 Referring Provider: Dr. Justice Britain  Encounter Date: 03/13/2016      PT End of Session - 03/13/16 1608    Visit Number 8   Number of Visits 16   Date for PT Re-Evaluation 04/11/16   PT Start Time 2951   PT Stop Time 1651   PT Time Calculation (min) 47 min   Activity Tolerance Patient tolerated treatment well;No increased pain      Past Medical History:  Diagnosis Date  . Arthritis   . Hyperlipidemia   . 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    Past Surgical History:  Procedure Laterality Date  . ROTATOR CUFF REPAIR Right 01/02/16    There were no vitals filed for this visit.      Subjective Assessment - 03/13/16 1608    Subjective Pt reports no new changes since last visit. He has incorporated new exercises into HEP.    Patient Stated Goals lift his arm over his head, carry items  - lift his bag ( ~ 20#) exercise at [employer].    Currently in Pain? No/denies            [facility] PT Assessment - 03/13/16 0001      Assessment   Medical Diagnosis Rt RTC repair   Referring Provider Dr. Justice Britain   Onset Date/Surgical Date 01/02/16   Hand Dominance Right   Next MD Visit 03/15/16   Prior Therapy none     AROM   Right/Left Shoulder Right   Right Shoulder Extension 51 Degrees  standing    Right Shoulder Flexion 135 Degrees  standing;  supine 155 deg (with cane)   Right Shoulder ABduction 130 Degrees  standing    Right Shoulder External Rotation 76 Degrees  supine, shoulder abdct ~80 deg          [facility] Adult PT Treatment/Exercise - 03/13/16 0001      Shoulder Exercises: Supine   External Rotation AAROM;Right;10 reps  cane   Flexion Right;5 reps;AAROM  cane      Shoulder Exercises: Standing   External Rotation Right;10 reps;Theraband  2 sets, VC for form.    Theraband Level (Shoulder External Rotation) Level 1 (Yellow)   Internal Rotation Right;10 reps;Theraband  2 sets, VC for form    Theraband Level (Shoulder Internal Rotation) Level 1 (Yellow)   Flexion Strengthening;Right;10 reps;Theraband  (rockwood 4)   Theraband Level (Shoulder Flexion) Level 1 (Yellow)  2 sets   Row Strengthening;Right;20 reps;Theraband   Theraband Level (Shoulder Row) Level 1 (Yellow)     Shoulder Exercises: Pulleys   Flexion 3 minutes   ABduction 3 minutes     Shoulder Exercises: Therapy Ball   Flexion 5 reps  small circles, up and down wall     Shoulder Exercises: Stretch   Other Shoulder Stretches IR stretch with arm behind back, 10 sec hold x 10 reps     Vasopneumatic   Number Minutes Vasopneumatic  15 minutes   Vasopnuematic Location  Shoulder   Vasopneumatic Pressure Low   Vasopneumatic Temperature  3*                  PT Short Term Goals - 03/13/16 1640      PT SHORT TERM GOAL #1  Title I with initial HEP ( 03/14/16)    Time 4   Period Weeks   Status Achieved     PT SHORT TERM GOAL #2   Title demo Rt shoulder ROM WFL to perform upper body dressing per his previous level ( 03/14/16)    Time 4   Period Weeks   Status Achieved     PT SHORT TERM GOAL #3   Title tolerate initial strengthening exercise Rt shoulder ( 03/14/16)    Time 4   Period Weeks   Status Achieved     PT SHORT TERM GOAL #4   Title Rt shoulder pain =/< 2/10 with dailly activity ( 03/14/16)    Time 4   Period Weeks   Status Achieved     PT SHORT TERM GOAL #5   Title improve FOTO =/< 45% limited ( 03/14/16)    Time 4   Period Weeks   Status Achieved           PT Long Term Goals - 02/23/16 1403      PT LONG TERM GOAL #1   Title I with advanced HEP ( 04/11/16)    Time 8   Period Weeks   Status On-going     PT LONG TERM GOAL #2   Title demo Rt UE  strength =/> 5-/5 to allow return to normal activities ( 04/11/16)    Time 8   Period Weeks   Status On-going     PT LONG TERM GOAL #3   Title lift his bag for work without difficulty ( 04/11/16)    Time 8   Period Weeks   Status On-going     PT LONG TERM GOAL #4   Title return to exercise at [employer] ( 04/11/16)    Time 8   Period Weeks   Status On-going       Education:  Pt issued handout of [place] 4. Pt verbalized understanding, returned demo. Pt issued yellow band.          Plan - 03/13/16 1642    Clinical Impression Statement Pt demonstrated improved Rt shoulder ROM.  Mikle tolerated beginning band strengthening exercises today, without increase in pain.  He has now met all STG, with FOTO score of 37% limited.  He is making good gains towards remaining goals.    Rehab Potential Excellent   PT Frequency 2x / week   PT Duration 8 weeks   PT Treatment/Interventions Moist Heat;Ultrasound;Therapeutic exercise;Dry needling;Taping;Manual techniques;Neuromuscular re-education;Cryotherapy;Electrical Stimulation;Iontophoresis 84m/ml Dexamethasone;Passive range of motion;Vasopneumatic Device   PT Next Visit Plan   Continue progressive shoulder ROM, scapular stability, strengthening.  Assess response to new HEP.    Consulted and Agree with Plan of Care Patient      Patient will benefit from skilled therapeutic intervention in order to improve the following deficits and impairments:  Postural dysfunction, Decreased strength, Pain, Impaired UE functional use, Decreased range of motion  Visit Diagnosis: Stiffness of right shoulder, not elsewhere classified  Muscle weakness (generalized)  Pain in right shoulder  Abnormal posture     Problem List Patient Active Problem List   Diagnosis Date Noted  . Hyperlipidemia 03/10/2012   JKerin Perna PTA 03/13/16 4:48 PM  [place] [address]Phone: [phone]  Fax:  [phone] Name: MJohm PfannenstielMRN: [id number] of Birth: 110-24-1952

## 2016-03-13 NOTE — Patient Instructions (Signed)
  Low Row: Single Arm   Face anchor in stride stance. Palm up, pull arm back while squeezing shoulder blades together. Repeat 10__ times per set. Repeat with other arm. Do _2-3_ sets per session. Do 3__ sessions per week. Anchor Height: Waist  http://tub.exer.[email or web address]   Copyright  VHI. All rights reserved.  Press: Thumb Up (Single Arm)   Face away from anchor in stride stance, leg forward opposite exercising arm. Press arm forward with thumb up. Repeat 10 times per set.  Do _2-3_ sets per session. Do _3_ sessions per week. Anchor Height: Chest  http://tub.exer.[email or web address]   Copyright  VHI. All rights reserved.  Rotation: External (Single Arm)   Side toward anchor in shoulder width stance with elbow bent to 90, arm across mid-section. Thumb up, pull arm away from body, keeping elbow bent. Repeat _10_ times per set. Repeat with other arm. Do _2-3_ sets per session. Do _3_ sessions per week. Anchor Height: Waist  http://tub.exer.[email or web address]   Copyright  VHI. All rights reserved.  Rotation: Internal (Single Arm)   Side toward anchor in shoulder width stance with elbow bent to 90, forearm away from body. Thumb up, pull arm across body keeping elbow bent. Repeat _10_ times per set. Repeat with other arm. Do _2-3_ sets per session. Do _3_ sessions per week. Anchor Height: Waist  [facility] [place], [place] [address]  [phone] (office) [phone] (fax)

## 2016-03-15 ENCOUNTER — Ambulatory Visit (INDEPENDENT_AMBULATORY_CARE_PROVIDER_SITE_OTHER): Payer: BLUE CROSS/BLUE SHIELD | Admitting: Physical Therapy

## 2016-03-15 ENCOUNTER — Encounter: Payer: BLUE CROSS/BLUE SHIELD | Admitting: Physical Therapy

## 2016-03-15 DIAGNOSIS — R293 Abnormal posture: Secondary | ICD-10-CM

## 2016-03-15 DIAGNOSIS — M25611 Stiffness of right shoulder, not elsewhere classified: Secondary | ICD-10-CM | POA: Diagnosis not present

## 2016-03-15 DIAGNOSIS — M6281 Muscle weakness (generalized): Secondary | ICD-10-CM | POA: Diagnosis not present

## 2016-03-15 DIAGNOSIS — M25511 Pain in right shoulder: Secondary | ICD-10-CM | POA: Diagnosis not present

## 2016-03-15 NOTE — Therapy (Signed)
[place], [place], [address] Phone: [phone]   Fax:  [phone]  Physical Therapy Treatment  Patient Details  Name: Earl Thomas MRN: [id number] Date of Birth: [birth year] Referring Provider: Dr. Justice Britain   Encounter Date: 03/15/2016      PT End of Session - 03/15/16 0809    Visit Number 9   Number of Visits 16   PT Start Time 0804   PT Stop Time 0900   PT Time Calculation (min) 56 min   Activity Tolerance Patient tolerated treatment well;No increased pain      Past Medical History:  Diagnosis Date  . Arthritis   . Hyperlipidemia   . 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    Past Surgical History:  Procedure Laterality Date  . ROTATOR CUFF REPAIR Right 01/02/16    There were no vitals filed for this visit.      Subjective Assessment - 03/15/16 0809    Subjective Pt reports no new changes since last visit. He has follow up visit with surgeon today.    Currently in Pain? No/denies   Pain Score 0-No pain   Pain Location Shoulder   Pain Orientation Right            [facility] PT Assessment - 03/15/16 0001      Assessment   Medical Diagnosis Rt RTC repair   Referring Provider Dr. Justice Britain    Onset Date/Surgical Date 01/02/16   Hand Dominance Right   Next MD Visit 03/15/16   Prior Therapy none     Precautions   Precautions Shoulder           [facility] Adult PT Treatment/Exercise - 03/15/16 0001      Shoulder Exercises: Supine   Other Supine Exercises 90 deg abduction prolonged stretch with Rt arm off of table.       Shoulder Exercises: Standing   External Rotation Right;10 reps;Theraband  2 sets, VC for form.    Theraband Level (Shoulder External Rotation) Level 1 (Yellow)   Internal Rotation Right;10 reps;Theraband  2 sets, VC for form    Theraband Level (Shoulder Internal Rotation) Level 1 (Yellow)   Flexion Strengthening;Right;10  reps;Theraband  (rockwood 4)   Theraband Level (Shoulder Flexion) Level 1 (Yellow)  2 sets   Extension Strengthening;Both;10 reps;Theraband  2 sets   Theraband Level (Shoulder Extension) Level 1 (Yellow)   Row Strengthening;Right;20 reps;Theraband   Theraband Level (Shoulder Row) Level 1 (Yellow)   Other Standing Exercises wall ladder RUE in flexion and scaption (up to #27) x 5 reps each   Other Standing Exercises Flexion and scaption AROM (full tolerated range) with mirror for visual feedback x 8 reps (noodle squeeze with scapulas), then flexion and scaption with 1# in hand to 80-90 deg x 5 reps each.   Rt IR AAROM behind back x 5 reps, then 5 reps AROM.      Shoulder Exercises: ROM/Strengthening   UBE (Upper Arm Bike) L1: 1 min each direction (standing)     Vasopneumatic   Number Minutes Vasopneumatic  15 minutes   Vasopnuematic Location  Shoulder   Vasopneumatic Pressure Low   Vasopneumatic Temperature  3*     Manual Therapy   Passive ROM Rt shoulder ER, IR, and Abduction                   PT Short Term Goals - 03/13/16 1640      PT SHORT  TERM GOAL #1   Title I with initial HEP ( 03/14/16)    Time 4   Period Weeks   Status Achieved     PT SHORT TERM GOAL #2   Title demo Rt shoulder ROM WFL to perform upper body dressing per his previous level ( 03/14/16)    Time 4   Period Weeks   Status Achieved     PT SHORT TERM GOAL #3   Title tolerate initial strengthening exercise Rt shoulder ( 03/14/16)    Time 4   Period Weeks   Status Achieved     PT SHORT TERM GOAL #4   Title Rt shoulder pain =/< 2/10 with dailly activity ( 03/14/16)    Time 4   Period Weeks   Status Achieved     PT SHORT TERM GOAL #5   Title improve FOTO =/< 45% limited ( 03/14/16)    Time 4   Period Weeks   Status Achieved           PT Long Term Goals - 02/23/16 1403      PT LONG TERM GOAL #1   Title I with advanced HEP ( 04/11/16)    Time 8   Period Weeks   Status On-going     PT  LONG TERM GOAL #2   Title demo Rt UE strength =/> 5-/5 to allow return to normal activities ( 04/11/16)    Time 8   Period Weeks   Status On-going     PT LONG TERM GOAL #3   Title lift his bag for work without difficulty ( 04/11/16)    Time 8   Period Weeks   Status On-going     PT LONG TERM GOAL #4   Title return to exercise at [employer] ( 04/11/16)    Time 8   Period Weeks   Status On-going               Plan - 03/15/16 0845    Clinical Impression Statement Pt tolerated all new resistance exercises for Rt shoulder with minimal to no increase in pain.  He continues to have tightness in Rt shoulder ER/ Abduction.  Pt needed minor cues for proper form of new HEP exercises.  Progressing well towards unmet goals.    Rehab Potential Excellent   PT Frequency 2x / week   PT Duration 8 weeks   PT Treatment/Interventions Moist Heat;Ultrasound;Therapeutic exercise;Dry needling;Taping;Manual techniques;Neuromuscular re-education;Cryotherapy;Electrical Stimulation;Iontophoresis 48m/ml Dexamethasone;Passive range of motion;Vasopneumatic Device   PT Next Visit Plan   Continue progressive shoulder ROM, scapular stability, strengthening.     Consulted and Agree with Plan of Care Patient      Patient will benefit from skilled therapeutic intervention in order to improve the following deficits and impairments:  Postural dysfunction, Decreased strength, Pain, Impaired UE functional use, Decreased range of motion  Visit Diagnosis: Stiffness of right shoulder, not elsewhere classified  Muscle weakness (generalized)  Pain in right shoulder  Abnormal posture     Problem List Patient Active Problem List   Diagnosis Date Noted  . Hyperlipidemia 03/10/2012   JKerin Perna PTA 03/15/16 8:50 AM  [facility] [address]Phone: [phone]  Fax:  [phone] Name: Earl SlausonMRN: [id number] of Birth: 109/23/1952

## 2016-03-20 ENCOUNTER — Ambulatory Visit (INDEPENDENT_AMBULATORY_CARE_PROVIDER_SITE_OTHER): Payer: BLUE CROSS/BLUE SHIELD | Admitting: Physical Therapy

## 2016-03-20 DIAGNOSIS — M25611 Stiffness of right shoulder, not elsewhere classified: Secondary | ICD-10-CM | POA: Diagnosis not present

## 2016-03-20 DIAGNOSIS — M6281 Muscle weakness (generalized): Secondary | ICD-10-CM

## 2016-03-20 DIAGNOSIS — R293 Abnormal posture: Secondary | ICD-10-CM | POA: Diagnosis not present

## 2016-03-20 DIAGNOSIS — M25511 Pain in right shoulder: Secondary | ICD-10-CM | POA: Diagnosis not present

## 2016-03-20 NOTE — Therapy (Signed)
[place], [place], [address] Phone: [phone]   Fax:  [phone]  Physical Therapy Treatment  Patient Details  Name: Earl Thomas MRN: [id number] Date of Birth: August 14, 1950 Referring Provider: Dr. Justice Britain   Encounter Date: 03/20/2016      PT End of Session - 03/20/16 0726    Visit Number 10   Number of Visits 16   Date for PT Re-Evaluation 04/11/16   PT Start Time 0719   PT Stop Time 0805   PT Time Calculation (min) 46 min   Activity Tolerance Patient tolerated treatment well      Past Medical History:  Diagnosis Date  . Arthritis   . Hyperlipidemia   . 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    Past Surgical History:  Procedure Laterality Date  . ROTATOR CUFF REPAIR Right 01/02/16    There were no vitals filed for this visit.      Subjective Assessment - 03/20/16 0740    Subjective Pt had follow up with MD.  No longer needs to wear sling.  MD is pleased with progress, sent order for 4 more weeks of therapy.     Patient Stated Goals lift his arm over his head, carry items  - lift his bag ( ~ 20#) exercise at [employer].    Currently in Pain? No/denies            [facility] PT Assessment - 03/20/16 0001      Assessment   Medical Diagnosis Rt RTC repair   Referring Provider Dr. Justice Britain    Onset Date/Surgical Date 01/02/16   Hand Dominance Right   Next MD Visit 04/12/16   Prior Therapy none           [facility] Adult PT Treatment/Exercise - 03/20/16 0001      Shoulder Exercises: Standing   External Rotation Right;10 reps;Theraband  2 sets, VC for form.    Theraband Level (Shoulder External Rotation) Level 1 (Yellow)   Internal Rotation Right;Theraband;12 reps  2 sets, VC for form    Theraband Level (Shoulder Internal Rotation) Level 1 (Yellow)   Flexion Strengthening;Right;10 reps;Theraband  (rockwood 4)   Theraband Level  (Shoulder Flexion) Level 1 (Yellow)  2 sets   Extension Strengthening;Both;10 reps;Theraband  2 sets   Theraband Level (Shoulder Extension) Level 1 (Yellow)   Row Strengthening;Both;10 reps;Theraband   Theraband Level (Shoulder Row) Level 1 (Yellow);Level 2 (Red)  (1 set 10 with yellow, red)   Other Standing Exercises Rings on hoop Rt to/from Lt x 12 @ 90 deg of flexion, 8 @ ~110 deg flexion     Shoulder Exercises: Pulleys   Flexion 2 minutes   ABduction 3 minutes   Other Pulley Exercises IR AAROM stretch x 10 sec hold x 5 reps, then AROM x 10 reps (hand behind back)     Shoulder Exercises: Stretch   Other Shoulder Stretches Doorway stretch low,various midlevel x 20 sec each position x 2      Vasopneumatic   Number Minutes Vasopneumatic  15 minutes   Vasopnuematic Location  Shoulder   Vasopneumatic Pressure Low   Vasopneumatic Temperature  3*                  PT Short Term Goals - 03/13/16 1640      PT SHORT TERM GOAL #1   Title I with initial HEP ( 03/14/16)    Time 4   Period  Weeks   Status Achieved     PT SHORT TERM GOAL #2   Title demo Rt shoulder ROM WFL to perform upper body dressing per his previous level ( 03/14/16)    Time 4   Period Weeks   Status Achieved     PT SHORT TERM GOAL #3   Title tolerate initial strengthening exercise Rt shoulder ( 03/14/16)    Time 4   Period Weeks   Status Achieved     PT SHORT TERM GOAL #4   Title Rt shoulder pain =/< 2/10 with dailly activity ( 03/14/16)    Time 4   Period Weeks   Status Achieved     PT SHORT TERM GOAL #5   Title improve FOTO =/< 45% limited ( 03/14/16)    Time 4   Period Weeks   Status Achieved           PT Long Term Goals - 02/23/16 1403      PT LONG TERM GOAL #1   Title I with advanced HEP ( 04/11/16)    Time 8   Period Weeks   Status On-going     PT LONG TERM GOAL #2   Title demo Rt UE strength =/> 5-/5 to allow return to normal activities ( 04/11/16)    Time 8   Period Weeks    Status On-going     PT LONG TERM GOAL #3   Title lift his bag for work without difficulty ( 04/11/16)    Time 8   Period Weeks   Status On-going     PT LONG TERM GOAL #4   Title return to exercise at [employer] ( 04/11/16)    Time 8   Period Weeks   Status On-going               Plan - 03/20/16 0758    Clinical Impression Statement Pt tolerated all new ROM and resistance exercises with mild to no increase in Rt shoulder pain.  Pt requires minor cues for form with exercises added to HEP.     Rehab Potential Excellent   PT Frequency 2x / week   PT Duration 8 weeks   PT Treatment/Interventions Moist Heat;Ultrasound;Therapeutic exercise;Dry needling;Taping;Manual techniques;Neuromuscular re-education;Cryotherapy;Electrical Stimulation;Iontophoresis 32m/ml Dexamethasone;Passive range of motion;Vasopneumatic Device   PT Next Visit Plan   Continue progressive Rt shoulder ROM, scapular stability, and strengthening.     Consulted and Agree with Plan of Care Patient      Patient will benefit from skilled therapeutic intervention in order to improve the following deficits and impairments:  Postural dysfunction, Decreased strength, Pain, Impaired UE functional use, Decreased range of motion  Visit Diagnosis: Stiffness of right shoulder, not elsewhere classified  Muscle weakness (generalized)  Pain in right shoulder  Abnormal posture     Problem List Patient Active Problem List   Diagnosis Date Noted  . Hyperlipidemia 03/10/2012   JKerin Perna PTA 03/20/16 8:11 AM   [place] [address]Phone: [phone]  Fax:  [phone] Name: Earl UppermanMRN: [id number] of Birth: 107/01/1951

## 2016-03-20 NOTE — Patient Instructions (Signed)
Scapula Adduction With Pectorals, Low   Stand in doorframe with palms against frame and arms at 45. Lean forward and squeeze shoulder blades. Hold __15_ seconds. Repeat _2__ times per session. Do _1-2__ sessions per day.  Copyright  VHI. All rights reserved.    Scapula Adduction With Pectorals, Mid-Range   Stand in doorframe with palms against frame and arms at 90. Lean forward and squeeze shoulder blades. Hold __15_ seconds. Repeat __2_ times per session. Do _1-2__ sessions per day. \Scapula Adduction With Pectorals, High   Stand in doorframe with palms against frame and arms at 120. Lean forward and squeeze shoulder blades. Hold _15__ seconds. Repeat __2_ times per session. Do _1-2__ sessions per day.   [facility] at [facility] [place], [place] [address]  [phone] (office) [phone] (fax)

## 2016-03-21 ENCOUNTER — Ambulatory Visit (INDEPENDENT_AMBULATORY_CARE_PROVIDER_SITE_OTHER): Payer: BLUE CROSS/BLUE SHIELD | Admitting: Physical Therapy

## 2016-03-21 DIAGNOSIS — M6281 Muscle weakness (generalized): Secondary | ICD-10-CM

## 2016-03-21 DIAGNOSIS — M25611 Stiffness of right shoulder, not elsewhere classified: Secondary | ICD-10-CM

## 2016-03-21 DIAGNOSIS — R293 Abnormal posture: Secondary | ICD-10-CM

## 2016-03-21 DIAGNOSIS — M25511 Pain in right shoulder: Secondary | ICD-10-CM | POA: Diagnosis not present

## 2016-03-21 NOTE — Therapy (Signed)
[place], [place], [address] Phone: [phone]   Fax:  [phone]  Physical Therapy Treatment  Patient Details  Name: Earl Thomas MRN: [id number] Date of Birth: [birth year] Referring Provider: Dr. Justice Britain   Encounter Date: 03/21/2016      PT End of Session - 03/21/16 1108    Visit Number 11   Number of Visits 16   Date for PT Re-Evaluation 04/11/16   PT Start Time 1105   PT Stop Time 1202   PT Time Calculation (min) 57 min   Activity Tolerance Patient tolerated treatment well      Past Medical History:  Diagnosis Date  . Arthritis   . Hyperlipidemia   . 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    Past Surgical History:  Procedure Laterality Date  . ROTATOR CUFF REPAIR Right 01/02/16    There were no vitals filed for this visit.      Subjective Assessment - 03/21/16 1109    Subjective Pt reports he had some soreness at end of day yesterday, but nothing bad enough for taking medicine or icing. It resolved with sleep.     Currently in Pain? No/denies   Pain Score 0-No pain            [facility] PT Assessment - 03/21/16 0001      Assessment   Medical Diagnosis Rt RTC repair     AROM   Right Shoulder Flexion 144 Degrees  standing   Right Shoulder ABduction 137 Degrees  standing          [facility] Adult PT Treatment/Exercise - 03/21/16 0001      Shoulder Exercises: Supine   Flexion Strengthening;Both;10 reps;Theraband  yellow band   Theraband Level (Shoulder Flexion) Level 1 (Yellow)   Other Supine Exercises Rt shoulder flexion @ 45 - circles CW/CCW, X's -20 each; repeated at ~105 deg    Other Supine Exercises shoulder stretch with hands behind head, pressing elbows down x 15 sec x 5 reps      Shoulder Exercises: Seated   External Rotation Both;10 reps;Theraband   Theraband Level (Shoulder External Rotation) Level 1 (Yellow)     Shoulder  Exercises: Standing   External Rotation Right;10 reps;Theraband   Theraband Level (Shoulder External Rotation) Level 1 (Yellow)   Internal Rotation Strengthening;Both;10 reps   Flexion Strengthening;Right;10 reps;Theraband  (rockwood 4)   Theraband Level (Shoulder Flexion) Level 1 (Yellow)   Row Strengthening;Both;10 reps;Theraband   Theraband Level (Shoulder Row) Level 1 (Yellow)   Other Standing Exercises Rings on hoop Rt to/from Lt x 12 @ ~110 deg flexion x 2 sets   Other Standing Exercises Flexion and Abduction with wall ladder x 5 reps each, up to #28.      Shoulder Exercises: ROM/Strengthening   UBE (Upper Arm Bike) L1: 1.5 min each direction (standing)     Shoulder Exercises: Stretch   Other Shoulder Stretches Doorway stretch low,various midlevel x 20 sec each position x 2      Vasopneumatic   Number Minutes Vasopneumatic  15 minutes   Vasopnuematic Location  Shoulder   Vasopneumatic Pressure Low   Vasopneumatic Temperature  3*                  PT Short Term Goals - 03/13/16 1640      PT SHORT TERM GOAL #1   Title I with initial HEP ( 03/14/16)    Time 4   Period  Weeks   Status Achieved     PT SHORT TERM GOAL #2   Title demo Rt shoulder ROM WFL to perform upper body dressing per his previous level ( 03/14/16)    Time 4   Period Weeks   Status Achieved     PT SHORT TERM GOAL #3   Title tolerate initial strengthening exercise Rt shoulder ( 03/14/16)    Time 4   Period Weeks   Status Achieved     PT SHORT TERM GOAL #4   Title Rt shoulder pain =/< 2/10 with dailly activity ( 03/14/16)    Time 4   Period Weeks   Status Achieved     PT SHORT TERM GOAL #5   Title improve FOTO =/< 45% limited ( 03/14/16)    Time 4   Period Weeks   Status Achieved           PT Long Term Goals - 02/23/16 1403      PT LONG TERM GOAL #1   Title I with advanced HEP ( 04/11/16)    Time 8   Period Weeks   Status On-going     PT LONG TERM GOAL #2   Title demo Rt UE  strength =/> 5-/5 to allow return to normal activities ( 04/11/16)    Time 8   Period Weeks   Status On-going     PT LONG TERM GOAL #3   Title lift his bag for work without difficulty ( 04/11/16)    Time 8   Period Weeks   Status On-going     PT LONG TERM GOAL #4   Title return to exercise at [employer] ( 04/11/16)    Time 8   Period Weeks   Status On-going               Plan - 03/20/16 0758    Clinical Impression Statement Pt tolerated all new ROM and resistance exercises with mild to no increase in Rt shoulder pain.  Pt requires minor cues for form with exercises added to HEP.     Rehab Potential Excellent   PT Frequency 2x / week   PT Duration 8 weeks   PT Treatment/Interventions Moist Heat;Ultrasound;Therapeutic exercise;Dry needling;Taping;Manual techniques;Neuromuscular re-education;Cryotherapy;Electrical Stimulation;Iontophoresis 81m/ml Dexamethasone;Passive range of motion;Vasopneumatic Device   PT Next Visit Plan   Continue progressive Rt shoulder ROM, scapular stability, and strengthening.     Consulted and Agree with Plan of Care Patient      Patient will benefit from skilled therapeutic intervention in order to improve the following deficits and impairments:     Visit Diagnosis: Stiffness of right shoulder, not elsewhere classified  Muscle weakness (generalized)  Pain in right shoulder  Abnormal posture     Problem List Patient Active Problem List   Diagnosis Date Noted  . Hyperlipidemia 03/10/2012   JKerin Perna PTA 03/21/16 11:48 AM  [place] [address]Phone: [phone]  Fax:  [phone] Name: Earl CyganMRN: [id number] of Birth: 1Nov 13, 1952

## 2016-03-25 ENCOUNTER — Ambulatory Visit (INDEPENDENT_AMBULATORY_CARE_PROVIDER_SITE_OTHER): Payer: BLUE CROSS/BLUE SHIELD | Admitting: Physical Therapy

## 2016-03-25 DIAGNOSIS — M25511 Pain in right shoulder: Secondary | ICD-10-CM

## 2016-03-25 DIAGNOSIS — M6281 Muscle weakness (generalized): Secondary | ICD-10-CM | POA: Diagnosis not present

## 2016-03-25 DIAGNOSIS — M25611 Stiffness of right shoulder, not elsewhere classified: Secondary | ICD-10-CM | POA: Diagnosis not present

## 2016-03-25 DIAGNOSIS — R293 Abnormal posture: Secondary | ICD-10-CM | POA: Diagnosis not present

## 2016-03-25 NOTE — Therapy (Signed)
[place], [place], [address] Phone: [phone]   Fax:  [phone]  Physical Therapy Treatment  Patient Details  Name: Earl Thomas MRN: [id number] Date of Birth: 20-May-1951 Referring Provider: Dr. Justice Britain  Encounter Date: 03/25/2016      PT End of Session - 03/25/16 0723    Visit Number 12   Number of Visits 16   Date for PT Re-Evaluation 04/11/16   PT Start Time 9678   PT Stop Time 0810   PT Time Calculation (min) 53 min   Activity Tolerance Patient tolerated treatment well      Past Medical History:  Diagnosis Date  . Arthritis   . Hyperlipidemia   . 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    Past Surgical History:  Procedure Laterality Date  . ROTATOR CUFF REPAIR Right 01/02/16    There were no vitals filed for this visit.      Subjective Assessment - 03/25/16 0723    Subjective Pt reports no new changes since last visit.  He continues to perform HEP each day.    Currently in Pain? No/denies            [facility] PT Assessment - 03/25/16 0001      Assessment   Medical Diagnosis Rt RTC repair   Referring Provider Dr. Justice Britain   Onset Date/Surgical Date 01/02/16   Hand Dominance Right   Next MD Visit 04/12/16   Prior Therapy none     AROM   Right Shoulder Flexion 144 Degrees  standing   Right Shoulder ABduction 137 Degrees  standing           [facility] Adult PT Treatment/Exercise - 03/25/16 0001      Shoulder Exercises: Standing   External Rotation Right;10 reps;Theraband  2 sets   Theraband Level (Shoulder External Rotation) Level 1 (Yellow)   Internal Rotation Strengthening;Right;10 reps;Theraband   Theraband Level (Shoulder Internal Rotation) Level 2 (Red)   Flexion Strengthening;Right;10 reps;Theraband  (rockwood 4)   Theraband Level (Shoulder Flexion) Level 2 (Red)  2 sets   Extension Strengthening;10 reps;Theraband   Theraband Level (Shoulder Extension) Level 2 (Red)  2 sets   Row Strengthening;Both;10 reps;Theraband   Theraband Level (Shoulder Row) Level 2 (Red)  2 sets   Other Standing Exercises lIfting 1 and 2# weight to shelf at chin level and above head level with slow controlled descent (x 5 reps with each weight) 2# challenging.     Other Standing Exercises elbow flexion with 5# x 10 reps, then lifting 5# from floor to waist.  1 attempt with 9# (very challenging).    Wall ladder x 5 reps in flexion, then abduction.      Shoulder Exercises: ROM/Strengthening   UBE (Upper Arm Bike) L1: 2 min each direction (standing)     Shoulder Exercises: Stretch   Other Shoulder Stretches Doorway stretch 3 positions, 30 sec each, 2 reps each.       Vasopneumatic   Number Minutes Vasopneumatic  15 minutes   Vasopnuematic Location  Shoulder   Vasopneumatic Pressure Low   Vasopneumatic Temperature  3*                  PT Short Term Goals - 03/13/16 1640      PT SHORT TERM GOAL #1   Title I with initial HEP ( 03/14/16)    Time 4   Period Weeks   Status Achieved  PT SHORT TERM GOAL #2   Title demo Rt shoulder ROM WFL to perform upper body dressing per his previous level ( 03/14/16)    Time 4   Period Weeks   Status Achieved     PT SHORT TERM GOAL #3   Title tolerate initial strengthening exercise Rt shoulder ( 03/14/16)    Time 4   Period Weeks   Status Achieved     PT SHORT TERM GOAL #4   Title Rt shoulder pain =/< 2/10 with dailly activity ( 03/14/16)    Time 4   Period Weeks   Status Achieved     PT SHORT TERM GOAL #5   Title improve FOTO =/< 45% limited ( 03/14/16)    Time 4   Period Weeks   Status Achieved           PT Long Term Goals - 02/23/16 1403      PT LONG TERM GOAL #1   Title I with advanced HEP ( 04/11/16)    Time 8   Period Weeks   Status On-going     PT LONG TERM GOAL #2   Title demo Rt UE strength =/> 5-/5 to allow return to normal activities ( 04/11/16)     Time 8   Period Weeks   Status On-going     PT LONG TERM GOAL #3   Title lift his bag for work without difficulty ( 04/11/16)    Time 8   Period Weeks   Status On-going     PT LONG TERM GOAL #4   Title return to exercise at [employer] ( 04/11/16)    Time 8   Period Weeks   Status On-going               Plan - 03/25/16 0756    Clinical Impression Statement Pt tolerated increased resistance with elbow and shoulder exercises today with mild increase in pain; reduced with rest.  Pt's ROM in shoulder similar to last visit.  Making good gains towards unmet goals.    Rehab Potential Excellent   PT Frequency 2x / week   PT Duration 8 weeks   PT Treatment/Interventions Moist Heat;Ultrasound;Therapeutic exercise;Dry needling;Taping;Manual techniques;Neuromuscular re-education;Cryotherapy;Electrical Stimulation;Iontophoresis 69m/ml Dexamethasone;Passive range of motion;Vasopneumatic Device   PT Next Visit Plan   Continue progressive Rt shoulder ROM, scapular stability, and strengthening.     Consulted and Agree with Plan of Care Patient      Patient will benefit from skilled therapeutic intervention in order to improve the following deficits and impairments:  Postural dysfunction, Decreased strength, Pain, Impaired UE functional use, Decreased range of motion  Visit Diagnosis: Stiffness of right shoulder, not elsewhere classified  Muscle weakness (generalized)  Pain in right shoulder  Abnormal posture     Problem List Patient Active Problem List   Diagnosis Date Noted  . Hyperlipidemia 03/10/2012   JKerin Perna PTA 03/25/16 7:59 AM  [place] [address]Phone: [phone]  Fax:  [phone] Name: MMerek NiuMRN: [id number] of Birth: [birth year]

## 2016-03-29 ENCOUNTER — Ambulatory Visit (INDEPENDENT_AMBULATORY_CARE_PROVIDER_SITE_OTHER): Payer: BLUE CROSS/BLUE SHIELD | Admitting: Physical Therapy

## 2016-03-29 DIAGNOSIS — M25511 Pain in right shoulder: Secondary | ICD-10-CM

## 2016-03-29 DIAGNOSIS — M25611 Stiffness of right shoulder, not elsewhere classified: Secondary | ICD-10-CM

## 2016-03-29 DIAGNOSIS — M6281 Muscle weakness (generalized): Secondary | ICD-10-CM

## 2016-03-29 DIAGNOSIS — R293 Abnormal posture: Secondary | ICD-10-CM

## 2016-03-29 NOTE — Patient Instructions (Signed)
  Sash   On back, knees bent, feet flat, left hand on left hip, right hand above left. Pull right arm DIAGONALLY (hip to shoulder) across chest. Bring right arm along head toward floor. Hold momentarily. Slowly return to starting position. Repeat __10_ times. Do with left arm. Band color ___yellow___   Shoulder Rotation: Double Arm   On back, knees bent, feet flat, elbows tucked at sides, bent 90, hands palms up. Pull hands apart and down toward floor, keeping elbows near sides. Hold momentarily. Slowly return to starting position. Repeat _10__ times. Band color ___yellow___   [facility] at [place], [place] [address]  [phone] (office) [phone] (fax)

## 2016-03-29 NOTE — Therapy (Signed)
[place], [place], [address] Phone: [phone]   Fax:  [phone]  Physical Therapy Treatment  Patient Details  Name: Earl Thomas MRN: [id number] Date of Birth: 1950-12-05 Referring Provider: Dr. Justice Britain  Encounter Date: 03/29/2016      PT End of Session - 03/29/16 1533    Visit Number 13   Number of Visits 16   Date for PT Re-Evaluation 04/11/16   PT Start Time 8101   PT Stop Time 1618   PT Time Calculation (min) 49 min   Activity Tolerance Patient tolerated treatment well      Past Medical History:  Diagnosis Date  . Arthritis   . Hyperlipidemia   . 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    Past Surgical History:  Procedure Laterality Date  . ROTATOR CUFF REPAIR Right 01/02/16    There were no vitals filed for this visit.      Subjective Assessment - 03/29/16 1533    Subjective Pt reports the band exercises are going a little better at home, "not as painful".     Patient Stated Goals lift his arm over his head, carry items  - lift his bag ( ~ 20#) exercise at [employer].    Currently in Pain? No/denies   Pain Score 0-No pain            [facility] PT Assessment - 03/29/16 0001      Assessment   Medical Diagnosis Rt RTC repair   Referring Provider Dr. Justice Britain   Onset Date/Surgical Date 01/02/16   Hand Dominance Right   Next MD Visit 04/12/16   Prior Therapy none     AROM   Right Shoulder Flexion 157 Degrees  supine   Right Shoulder External Rotation 80 Degrees          [facility] Adult PT Treatment/Exercise - 03/29/16 0001      Shoulder Exercises: Supine   External Rotation Strengthening;Both;10 reps;Theraband   Theraband Level (Shoulder External Rotation) Level 1 (Yellow)   Other Supine Exercises Sash with red band in RUE x 5 reps, switched to yellow band x 12 reps      Shoulder Exercises: Standing   External Rotation  Right;10 reps;Theraband  2 sets   Theraband Level (Shoulder External Rotation) Level 1 (Yellow)   Flexion Strengthening;Right;Weights;12 reps  to 90 deg, 2 sets.  then 5 reps with 2#   Shoulder Flexion Weight (lbs) 1#   ABduction Right;12 reps  (scaption) 2 sets. then 5 reps with 2#   Shoulder ABduction Weight (lbs) 1#   Extension Strengthening;10 reps;Theraband   Theraband Level (Shoulder Extension) Level 2 (Red)   Row Strengthening;Both;10 reps;Theraband   Theraband Level (Shoulder Row) Level 2 (Red)  2 sets     Shoulder Exercises: ROM/Strengthening   UBE (Upper Arm Bike) L2:  1.5 min each direction, standing   Proximal Shoulder Strengthening, Supine O's and X's with Rt shoulder @ 110, 90 and 45 deg.      Shoulder Exercises: Stretch   Other Shoulder Stretches Doorway stretch 3 positions, 30 sec each, 2 reps each.       Modalities   Modalities Cryotherapy     Cryotherapy   Number Minutes Cryotherapy 10 Minutes   Cryotherapy Location Shoulder   Type of Cryotherapy Ice pack                PT Education - 03/29/16 1556    Education provided  Yes   Education Details HEP    Person(s) Educated Patient   Methods Explanation;Handout;Demonstration;Tactile cues   Comprehension Returned demonstration;Verbalized understanding          PT Short Term Goals - 03/13/16 1640      PT SHORT TERM GOAL #1   Title I with initial HEP ( 03/14/16)    Time 4   Period Weeks   Status Achieved     PT SHORT TERM GOAL #2   Title demo Rt shoulder ROM WFL to perform upper body dressing per his previous level ( 03/14/16)    Time 4   Period Weeks   Status Achieved     PT SHORT TERM GOAL #3   Title tolerate initial strengthening exercise Rt shoulder ( 03/14/16)    Time 4   Period Weeks   Status Achieved     PT SHORT TERM GOAL #4   Title Rt shoulder pain =/< 2/10 with dailly activity ( 03/14/16)    Time 4   Period Weeks   Status Achieved     PT SHORT TERM GOAL #5   Title improve FOTO  =/< 45% limited ( 03/14/16)    Time 4   Period Weeks   Status Achieved           PT Long Term Goals - 02/23/16 1403      PT LONG TERM GOAL #1   Title I with advanced HEP ( 04/11/16)    Time 8   Period Weeks   Status On-going     PT LONG TERM GOAL #2   Title demo Rt UE strength =/> 5-/5 to allow return to normal activities ( 04/11/16)    Time 8   Period Weeks   Status On-going     PT LONG TERM GOAL #3   Title lift his bag for work without difficulty ( 04/11/16)    Time 8   Period Weeks   Status On-going     PT LONG TERM GOAL #4   Title return to exercise at [employer] ( 04/11/16)    Time 8   Period Weeks   Status On-going               Plan - 03/29/16 1552    Clinical Impression Statement Pt tolerated all exercises for Rt shoulder well, with minimal to no increase in pain.  External rotation and sash exercise required yellow band, whereas others he tolerated red band.  Making good gains towards unmet goals with improved strength and ROM.    Rehab Potential Excellent   PT Frequency 2x / week   PT Duration 8 weeks   PT Treatment/Interventions Moist Heat;Ultrasound;Therapeutic exercise;Dry needling;Taping;Manual techniques;Neuromuscular re-education;Cryotherapy;Electrical Stimulation;Iontophoresis 53m/ml Dexamethasone;Passive range of motion;Vasopneumatic Device   PT Next Visit Plan   Continue progressive Rt shoulder ROM, scapular stability, and strengthening.     Consulted and Agree with Plan of Care Patient      Patient will benefit from skilled therapeutic intervention in order to improve the following deficits and impairments:  Postural dysfunction, Decreased strength, Pain, Impaired UE functional use, Decreased range of motion  Visit Diagnosis: Stiffness of right shoulder, not elsewhere classified  Muscle weakness (generalized)  Pain in right shoulder  Abnormal posture     Problem List Patient Active Problem List   Diagnosis Date Noted  .  Hyperlipidemia 03/10/2012   JKerin Perna PTA 03/29/16 4:15 PM  [place] [address]Phone:  [phone]   Fax:  [phone]  Name: Earl Thomas MRN: [id number] Date of Birth: [birth year]

## 2016-04-01 ENCOUNTER — Ambulatory Visit (INDEPENDENT_AMBULATORY_CARE_PROVIDER_SITE_OTHER): Payer: BLUE CROSS/BLUE SHIELD | Admitting: Physical Therapy

## 2016-04-01 DIAGNOSIS — M25511 Pain in right shoulder: Secondary | ICD-10-CM | POA: Diagnosis not present

## 2016-04-01 DIAGNOSIS — M6281 Muscle weakness (generalized): Secondary | ICD-10-CM | POA: Diagnosis not present

## 2016-04-01 DIAGNOSIS — R293 Abnormal posture: Secondary | ICD-10-CM

## 2016-04-01 DIAGNOSIS — M25611 Stiffness of right shoulder, not elsewhere classified: Secondary | ICD-10-CM

## 2016-04-01 NOTE — Therapy (Signed)
[place], [place], [address] Phone: [phone]   Fax:  [phone]  Physical Therapy Treatment  Patient Details  Name: Earl Thomas MRN: [id number] Date of Birth: 1950/12/19 Referring Provider: Dr. Justice Britain   Encounter Date: 04/01/2016      PT End of Session - 04/01/16 0718    Visit Number 14   Number of Visits 16   Date for PT Re-Evaluation 04/11/16   PT Start Time 0716   PT Stop Time 0809   PT Time Calculation (min) 53 min   Activity Tolerance Patient tolerated treatment well;No increased pain      Past Medical History:  Diagnosis Date  . Arthritis   . Hyperlipidemia   . 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    Past Surgical History:  Procedure Laterality Date  . ROTATOR CUFF REPAIR Right 01/02/16    There were no vitals filed for this visit.      Subjective Assessment - 04/01/16 0719    Subjective Pt reports he is not getting as much pain when performing stretches at home.  Otherwise nothing new to report.     Patient Stated Goals lift his arm over his head, carry items  - lift his bag ( ~ 20#) exercise at [employer].    Currently in Pain? No/denies            [facility] PT Assessment - 04/01/16 0001      Assessment   Medical Diagnosis Rt RTC repair   Referring Provider Dr. Justice Britain    Onset Date/Surgical Date 01/02/16   Hand Dominance Right   Next MD Visit 04/12/16   Prior Therapy none     AROM   Right Shoulder Flexion 152 Degrees  standing, 160 deg in supine   Right Shoulder ABduction 142 Degrees  standing   Right Shoulder External Rotation 85 Degrees  supine, abdct to 90 deg.                      [facility] Adult PT Treatment/Exercise - 04/01/16 0001      Shoulder Exercises: Supine   Horizontal ABduction Strengthening;Both;10 reps;Theraband   Theraband Level (Shoulder Horizontal ABduction) Level 1 (Yellow)   Flexion Strengthening;Both;10 reps;Theraband   Theraband Level (Shoulder Flexion) Level 1 (Yellow)   Other Supine Exercises Sash with yellow band in RUE x 10 reps x 2 sets     Shoulder Exercises: Standing   Horizontal ABduction Strengthening;Both;10 reps;Theraband   Theraband Level (Shoulder Horizontal ABduction) Level 1 (Yellow)   External Rotation Right;10 reps;Theraband  2 sets   Theraband Level (Shoulder External Rotation) Level 1 (Yellow)   Row Strengthening;Both;10 reps   Theraband Level (Shoulder Row) Level 2 (Red);Level 3 (Green)  (1 set with each color)   Other Standing Exercises Rings on arch -12 reps Rt to/from Lt (up to ~120 deg flexion; Lt to Rt challenging)   Other Standing Exercises elbow flexion with 6# x 10 reps.   Lift and carry of 9# (30 ft x 2 reps); repeated with 11# - more challenging.   Lift floor to waist 9# sack x 5 reps (to simulate lifting his workbag)     Shoulder Exercises: Pulleys   Flexion 2 minutes   ABduction 2 minutes     Shoulder Exercises: Stretch   Internal Rotation Stretch --  8 reps, x 10 sec hold.    Other Shoulder Stretches Doorway stretch 3 positions, 30 sec  each, 2 reps each.       Shoulder Exercises: Body Blade   Flexion 15 seconds;2 reps   ABduction 15 seconds;1 rep     Cryotherapy   Number Minutes Cryotherapy 12 Minutes   Cryotherapy Location Shoulder  Rt   Type of Cryotherapy Ice pack                  PT Short Term Goals - 03/13/16 1640      PT SHORT TERM GOAL #1   Title I with initial HEP ( 03/14/16)    Time 4   Period Weeks   Status Achieved     PT SHORT TERM GOAL #2   Title demo Rt shoulder ROM WFL to perform upper body dressing per his previous level ( 03/14/16)    Time 4   Period Weeks   Status Achieved     PT SHORT TERM GOAL #3   Title tolerate initial strengthening exercise Rt shoulder ( 03/14/16)    Time 4   Period Weeks   Status Achieved     PT SHORT TERM GOAL #4   Title Rt shoulder pain =/< 2/10  with dailly activity ( 03/14/16)    Time 4   Period Weeks   Status Achieved     PT SHORT TERM GOAL #5   Title improve FOTO =/< 45% limited ( 03/14/16)    Time 4   Period Weeks   Status Achieved           PT Long Term Goals - 02/23/16 1403      PT LONG TERM GOAL #1   Title I with advanced HEP ( 04/11/16)    Time 8   Period Weeks   Status On-going     PT LONG TERM GOAL #2   Title demo Rt UE strength =/> 5-/5 to allow return to normal activities ( 04/11/16)    Time 8   Period Weeks   Status On-going     PT LONG TERM GOAL #3   Title lift his bag for work without difficulty ( 04/11/16)    Time 8   Period Weeks   Status On-going     PT LONG TERM GOAL #4   Title return to exercise at [employer] ( 04/11/16)    Time 8   Period Weeks   Status On-going               Plan - 04/01/16 3267    Clinical Impression Statement Pt demonstrated improved Rt shoulder ROM. He is tolerating shoulder exercises with increased resistance with minimal increase in pain. He was able to tolerate lifting 9# today.  Making good gains toward remaining goals.    Rehab Potential Excellent   PT Frequency 2x / week   PT Duration 8 weeks   PT Treatment/Interventions Moist Heat;Ultrasound;Therapeutic exercise;Dry needling;Taping;Manual techniques;Neuromuscular re-education;Cryotherapy;Electrical Stimulation;Iontophoresis 29m/ml Dexamethasone;Passive range of motion;Vasopneumatic Device   PT Next Visit Plan   Continue progressive Rt shoulder ROM, scapular stability, and strengthening.     Consulted and Agree with Plan of Care Patient      Patient will benefit from skilled therapeutic intervention in order to improve the following deficits and impairments:  Postural dysfunction, Decreased strength, Pain, Impaired UE functional use, Decreased range of motion  Visit Diagnosis: Stiffness of right shoulder, not elsewhere classified  Muscle weakness (generalized)  Pain in right shoulder  Abnormal  posture     Problem List Patient Active Problem List   Diagnosis Date  Noted  . Hyperlipidemia 03/10/2012   Kerin Perna, PTA 04/01/16 7:57 AM   [facility] [place], [place], [address] Phone: [phone]   Fax:  [phone]  Name: Earl Thomas MRN: [id number] Date of Birth: [birth year]

## 2016-04-03 ENCOUNTER — Ambulatory Visit (INDEPENDENT_AMBULATORY_CARE_PROVIDER_SITE_OTHER): Payer: BLUE CROSS/BLUE SHIELD | Admitting: Physical Therapy

## 2016-04-03 DIAGNOSIS — M25511 Pain in right shoulder: Secondary | ICD-10-CM

## 2016-04-03 DIAGNOSIS — M6281 Muscle weakness (generalized): Secondary | ICD-10-CM | POA: Diagnosis not present

## 2016-04-03 DIAGNOSIS — R293 Abnormal posture: Secondary | ICD-10-CM | POA: Diagnosis not present

## 2016-04-03 DIAGNOSIS — M25611 Stiffness of right shoulder, not elsewhere classified: Secondary | ICD-10-CM

## 2016-04-03 NOTE — Therapy (Signed)
[place], [place], [address] Phone: [phone]   Fax:  [phone]  Physical Therapy Treatment  Patient Details  Name: Earl Thomas MRN: [id number] Date of Birth: [birth year] Referring Provider: Dr. Justice Britain   Encounter Date: 04/03/2016      PT End of Session - 04/03/16 1611    Visit Number 15   Number of Visits 16   Date for PT Re-Evaluation 04/11/16   PT Start Time 1602   PT Stop Time 1650   PT Time Calculation (min) 48 min   Activity Tolerance Patient tolerated treatment well      Past Medical History:  Diagnosis Date  . Arthritis   . Hyperlipidemia   . 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    Past Surgical History:  Procedure Laterality Date  . ROTATOR CUFF REPAIR Right 01/02/16    There were no vitals filed for this visit.      Subjective Assessment - 04/03/16 1611    Subjective Pt he weighed his work bags; one is 8#, the other is 18#.   He has been carrying the 8# bag in his Rt arm, 18# bag in his left arm with 2 flights of stairs.  His goal is to carry both in his bags in his Rt arm at same time.  He was a little sore after last session, but it resolved with rest overnight.    Patient Stated Goals lift his arm over his head, carry items  - lift his bag ( ~ 20#) exercise at [employer].    Currently in Pain? No/denies   Pain Score 0-No pain          [facility] Adult PT Treatment/Exercise - 04/03/16 0001      Shoulder Exercises: Standing   External Rotation Strengthening;Both;12 reps;Theraband   Theraband Level (Shoulder External Rotation) Level 2 (Red)   Flexion Strengthening;Right;10 reps;Weights  2 sets   Shoulder Flexion Weight (lbs) 2#   ABduction Right;10 reps   Shoulder ABduction Weight (lbs) 2#, 1#   ABduction Limitations painful with 2# after 4 reps - switched to 1#.    Extension Strengthening;Right;10 reps;Theraband   Theraband Level (Shoulder Extension) Level 3 (Green)   Row Strengthening;Right;10 reps;Theraband   Theraband Level (Shoulder Row) Level 3 (Green)   Other Standing Exercises Rings on arch -12 reps Rt to/from Lt (up to ~120 deg flexion), repeated with 6 rings.    Other Standing Exercises elbow flexion with 9# (waist to chest) x 8 reps.  forearm supination/pronation with 8# with Lt hand supporting weight x 5 reps (to replicate pouring milk)      Shoulder Exercises: Pulleys   Flexion 3 minutes   ABduction 2 minutes     Shoulder Exercises: Stretch   Other Shoulder Stretches Doorway stretch 3 positions, 30 sec each, 2 reps each.       Shoulder Exercises: Body Blade   Flexion 30 seconds;2 reps   ABduction 30 seconds;2 reps     Cryotherapy   Number Minutes Cryotherapy 10 Minutes   Cryotherapy Location Shoulder  Rt   Type of Cryotherapy Ice pack                  PT Short Term Goals - 03/13/16 1640      PT SHORT TERM GOAL #1   Title I with initial HEP ( 03/14/16)    Time 4   Period Weeks   Status Achieved  PT SHORT TERM GOAL #2   Title demo Rt shoulder ROM WFL to perform upper body dressing per his previous level ( 03/14/16)    Time 4   Period Weeks   Status Achieved     PT SHORT TERM GOAL #3   Title tolerate initial strengthening exercise Rt shoulder ( 03/14/16)    Time 4   Period Weeks   Status Achieved     PT SHORT TERM GOAL #4   Title Rt shoulder pain =/< 2/10 with dailly activity ( 03/14/16)    Time 4   Period Weeks   Status Achieved     PT SHORT TERM GOAL #5   Title improve FOTO =/< 45% limited ( 03/14/16)    Time 4   Period Weeks   Status Achieved           PT Long Term Goals - 02/23/16 1403      PT LONG TERM GOAL #1   Title I with advanced HEP ( 04/11/16)    Time 8   Period Weeks   Status On-going     PT LONG TERM GOAL #2   Title demo Rt UE strength =/> 5-/5 to allow return to normal activities ( 04/11/16)    Time 8   Period Weeks   Status  On-going     PT LONG TERM GOAL #3   Title lift his bag for work without difficulty ( 04/11/16)    Time 8   Period Weeks   Status On-going     PT LONG TERM GOAL #4   Title return to exercise at [employer] ( 04/11/16)    Time 8   Period Weeks   Status On-going               Plan - 04/03/16 1650    Clinical Impression Statement Pt making good gains towards remaining goals.  He had some increased pain with 2# for abd to 90 deg with RUE, but this went away when we switched to 1#.  Pt's Rt shoulder ROM, strength, and endurance improving each visit.    Rehab Potential Excellent   PT Frequency 2x / week   PT Duration 8 weeks   PT Treatment/Interventions Moist Heat;Ultrasound;Therapeutic exercise;Dry needling;Taping;Manual techniques;Neuromuscular re-education;Cryotherapy;Electrical Stimulation;Iontophoresis 45m/ml Dexamethasone;Passive range of motion;Vasopneumatic Device   PT Next Visit Plan Assess goals; end of POC.  Write MD note for upcoming appt.     Consulted and Agree with Plan of Care Patient      Patient will benefit from skilled therapeutic intervention in order to improve the following deficits and impairments:  Postural dysfunction, Decreased strength, Pain, Impaired UE functional use, Decreased range of motion  Visit Diagnosis: Stiffness of right shoulder, not elsewhere classified  Muscle weakness (generalized)  Pain in right shoulder  Abnormal posture     Problem List Patient Active Problem List   Diagnosis Date Noted  . Hyperlipidemia 03/10/2012   JKerin Perna PTA 04/03/16 4:52 PM [place] [address]Phone: [phone]  Fax:  [phone] Name: MChamp KeetchMRN: [id number] of Birth: 119-Oct-1952

## 2016-04-08 ENCOUNTER — Ambulatory Visit (INDEPENDENT_AMBULATORY_CARE_PROVIDER_SITE_OTHER): Payer: BLUE CROSS/BLUE SHIELD | Admitting: Physical Therapy

## 2016-04-08 DIAGNOSIS — M25611 Stiffness of right shoulder, not elsewhere classified: Secondary | ICD-10-CM

## 2016-04-08 DIAGNOSIS — M6281 Muscle weakness (generalized): Secondary | ICD-10-CM | POA: Diagnosis not present

## 2016-04-08 DIAGNOSIS — R293 Abnormal posture: Secondary | ICD-10-CM

## 2016-04-08 DIAGNOSIS — M25511 Pain in right shoulder: Secondary | ICD-10-CM | POA: Diagnosis not present

## 2016-04-08 NOTE — Therapy (Addendum)
[place], [place], [address] Phone: [phone]   Fax:  [phone]  Physical Therapy Treatment  Patient Details  Name: Earl Thomas MRN: [id number] Date of Birth: 1950-08-02 Referring Provider: Dr Justice Britain  Encounter Date: 04/08/2016      PT End of Session - 04/08/16 0718    Visit Number 16   Number of Visits 22   Date for PT Re-Evaluation 04/29/16   PT Start Time 0718   PT Stop Time 0816   PT Time Calculation (min) 58 min   Activity Tolerance Patient tolerated treatment well      Past Medical History:  Diagnosis Date  . Arthritis   . Hyperlipidemia   . 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    Past Surgical History:  Procedure Laterality Date  . ROTATOR CUFF REPAIR Right 01/02/16    There were no vitals filed for this visit.      Subjective Assessment - 04/08/16 0809    Subjective [name] is pleased with his progress so far, still not able to do all his things   Currently in Pain? No/denies            [facility] PT Assessment - 04/08/16 0001      Assessment   Medical Diagnosis Rt RTC repair   Referring Provider Dr Justice Britain   Onset Date/Surgical Date 01/02/16   Hand Dominance Right     Observation/Other Assessments   Focus on Therapeutic Outcomes (FOTO)  42% limited     AROM   AROM Assessment Site Shoulder   Right/Left Shoulder Right   Right Shoulder Extension 52 Degrees   Right Shoulder Flexion 160 Degrees   Right Shoulder ABduction 158 Degrees   Right Shoulder Internal Rotation 70 Degrees   Right Shoulder External Rotation 70 Degrees     Strength   Strength Assessment Site Shoulder;Elbow   Right/Left Shoulder Right   Right Shoulder Flexion 5/5   Right Shoulder ABduction 4/5   Right Shoulder Internal Rotation --  5-/5   Right Shoulder External Rotation 4-/5   Right/Left Elbow Right   Right Elbow Flexion 4+/5   Right Elbow  Extension 4-/5                     [facility] Adult PT Treatment/Exercise - 04/08/16 0001      Shoulder Exercises: Seated   Other Seated Exercises full can with 1#, 10 reps each, bilat, single , alternating     Shoulder Exercises: Sidelying   External Rotation Strengthening;Right;Weights  3x8 with towel under elbow   External Rotation Weight (lbs) 2  3# to heavy   ABduction Strengthening;Right;Weights  3x10 to 90 degrees   ABduction Weight (lbs) 1   Other Sidelying Exercises 3x8 empty can, 1# in small ROM     Shoulder Exercises: Standing   Horizontal ABduction Strengthening;Both;20 reps;Theraband   Theraband Level (Shoulder Horizontal ABduction) Level 3 (Green)  leaning on noodle   Other Standing Exercises counter top push ups,10 hands wide, 10 hands close.    Other Standing Exercises 10 reps FWD reach, holding 5# , 5 sec holds.      Shoulder Exercises: Pulleys   Other Pulley Exercises behind the back R UE x 2'     Shoulder Exercises: ROM/Strengthening   UBE (Upper Arm Bike) L3x4' alt FWD/BWD     Shoulder Exercises: Stretch   Other Shoulder Stretches shoulder flexion stretch with strap in door, strap high,  then low     Modalities   Modalities Cryotherapy     Cryotherapy   Number Minutes Cryotherapy 10 Minutes   Cryotherapy Location Shoulder  Rt   Type of Cryotherapy Ice pack     Vasopneumatic   Number Minutes Vasopneumatic  15 minutes   Vasopnuematic Location  Shoulder  Rt   Vasopneumatic Pressure Low   Vasopneumatic Temperature  3*                  PT Short Term Goals - 03/13/16 1640      PT SHORT TERM GOAL #1   Title I with initial HEP ( 03/14/16)    Time 4   Period Weeks   Status Achieved     PT SHORT TERM GOAL #2   Title demo Rt shoulder ROM WFL to perform upper body dressing per his previous level ( 03/14/16)    Time 4   Period Weeks   Status Achieved     PT SHORT TERM GOAL #3   Title tolerate initial strengthening exercise Rt  shoulder ( 03/14/16)    Time 4   Period Weeks   Status Achieved     PT SHORT TERM GOAL #4   Title Rt shoulder pain =/< 2/10 with dailly activity ( 03/14/16)    Time 4   Period Weeks   Status Achieved     PT SHORT TERM GOAL #5   Title improve FOTO =/< 45% limited ( 03/14/16)    Time 4   Period Weeks   Status Achieved           PT Long Term Goals - 04/08/16 2671      PT LONG TERM GOAL #1   Title I with advanced HEP ( 04/29/16)    Time 3   Period Weeks   Status On-going     PT LONG TERM GOAL #2   Title demo Rt UE strength =/> 5-/5 to allow return to normal activities ( 04/29/16)    Time 3   Period Weeks   Status Partially Met     PT LONG TERM GOAL #3   Title lift his bag for work without difficulty ( 04/29/16)    Time 3   Period Weeks   Status On-going     PT LONG TERM GOAL #4   Title return to exercise at [employer] ( 04/29/16)    Time 3   Period Weeks   Status On-going     PT LONG TERM GOAL #5   Title improve FOTO =/< 33% limited ( 04/29/16)    Time 3   Period Weeks   Status New               Plan - 04/08/16 0802    Clinical Impression Statement [name] is making good progress, lacking end range Rt shoulder motion and still has some weakness in the shoulder.  He would benefit from a little more therapy to restore strength to allow him to carry his work bags and return to all normal acitivty    Rehab Potential Excellent   PT Frequency 2x / week   PT Duration 3 weeks   PT Treatment/Interventions Moist Heat;Ultrasound;Therapeutic exercise;Dry needling;Taping;Manual techniques;Neuromuscular re-education;Cryotherapy;Electrical Stimulation;Iontophoresis 26m/ml Dexamethasone;Passive range of motion;Vasopneumatic Device   PT Next Visit Plan progress strengthening    Consulted and Agree with Plan of Care Patient      Patient will benefit from skilled therapeutic intervention in order to improve the following deficits and impairments:  Postural  dysfunction, Decreased strength, Pain, Impaired UE functional use, Decreased range of motion  Visit Diagnosis: Stiffness of right shoulder, not elsewhere classified - Plan: PT plan of care cert/re-cert  Muscle weakness (generalized) - Plan: PT plan of care cert/re-cert  Abnormal posture - Plan: PT plan of care cert/re-cert  Right shoulder pain, unspecified chronicity - Plan: PT plan of care cert/re-cert     Problem List Patient Active Problem List   Diagnosis Date Noted  . Hyperlipidemia 03/10/2012    Jeral Pinch PT 04/08/2016, 8:10 AM  [facility] [place], [place], [address] Phone: [phone]   Fax:  [phone]  Name: Kenechukwu Eckstein MRN: [id number] Date of Birth: 03-19-1951

## 2016-04-10 ENCOUNTER — Ambulatory Visit (INDEPENDENT_AMBULATORY_CARE_PROVIDER_SITE_OTHER): Payer: BLUE CROSS/BLUE SHIELD | Admitting: Physical Therapy

## 2016-04-10 DIAGNOSIS — M25511 Pain in right shoulder: Secondary | ICD-10-CM

## 2016-04-10 DIAGNOSIS — R293 Abnormal posture: Secondary | ICD-10-CM

## 2016-04-10 DIAGNOSIS — M6281 Muscle weakness (generalized): Secondary | ICD-10-CM

## 2016-04-10 DIAGNOSIS — M25611 Stiffness of right shoulder, not elsewhere classified: Secondary | ICD-10-CM

## 2016-04-10 NOTE — Therapy (Signed)
[facility] [place], [place], [address] Phone: [phone]   Fax:  [phone]  Physical Therapy Treatment  Patient Details  Name: Earl Thomas MRN: [id number] Date of Birth: 07-13-1950 Referring Provider: Dr Justice Britain  Encounter Date: 04/10/2016      PT End of Session - 04/10/16 1608    Visit Number 17   Number of Visits 22   Date for PT Re-Evaluation 04/29/16   PT Start Time 1600   PT Stop Time 6812   PT Time Calculation (min) 58 min   Activity Tolerance Patient tolerated treatment well;No increased pain     Subjective:  Pt reports he was a little sore after last session, but it resolved with rest.  No new changes.   Pain:  Pt denies.  0/10.   Past Medical History:  Diagnosis Date  . Arthritis   . Hyperlipidemia   . 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    Past Surgical History:  Procedure Laterality Date  . ROTATOR CUFF REPAIR Right 01/02/16    There were no vitals filed for this visit.      [facility] Adult PT Treatment/Exercise - 04/10/16 0001      Shoulder Exercises: Supine   Other Supine Exercises chest press with 5# in each hand x 10 reps x 2 sets     Shoulder Exercises: Seated   Row Strengthening;Both;10 reps;Weights  (on Nautilus)   Row Weight (lbs) 20#   Other Seated Exercises Nautilus lat pull down  20# x 10 reps, 30# x 10 reps     Shoulder Exercises: Sidelying   External Rotation Strengthening;Right;Weights;10 reps  with towel under elbow, 2 sets    External Rotation Weight (lbs) 2  3# 2nd set, 5 reps x 2    ABduction Strengthening;Right;10 reps;Weights  full can   ABduction Weight (lbs) 2   Other Sidelying Exercises 2 x10 empty can, 1# in small ROM     Shoulder Exercises: Standing   Other Standing Exercises Elbow ext (with shoulder flex @ 90 deg ) - blue band x 10 reps x 2 sets.  Then tricep ext with Nautilus 20#, x 10 reps, bicep curl  (bilat) with 20# x 10 reps x 2 sets   Other Standing Exercises Doorway stretch x 3 positions, x 3 reps each position.       Shoulder Exercises: ROM/Strengthening   UBE (Upper Arm Bike) L2 x4 min alt FWD/BWD     Shoulder Exercises: Stretch   Other Shoulder Stretches shoulder ext stretch x 10 sec x 5 reps;       Vasopneumatic   Number Minutes Vasopneumatic  15 minutes   Vasopnuematic Location  Shoulder  Rt   Vasopneumatic Pressure Low   Vasopneumatic Temperature  3*                  PT Short Term Goals - 03/13/16 1640      PT SHORT TERM GOAL #1   Title I with initial HEP ( 03/14/16)    Time 4   Period Weeks   Status Achieved     PT SHORT TERM GOAL #2   Title demo Rt shoulder ROM WFL to perform upper body dressing per his previous level ( 03/14/16)    Time 4   Period Weeks   Status Achieved     PT SHORT TERM GOAL #3   Title tolerate initial strengthening exercise Rt shoulder ( 03/14/16)    Time  4   Period Weeks   Status Achieved     PT SHORT TERM GOAL #4   Title Rt shoulder pain =/< 2/10 with dailly activity ( 03/14/16)    Time 4   Period Weeks   Status Achieved     PT SHORT TERM GOAL #5   Title improve FOTO =/< 45% limited ( 03/14/16)    Time 4   Period Weeks   Status Achieved           PT Long Term Goals - 04/08/16 8719      PT LONG TERM GOAL #1   Title I with advanced HEP ( 04/29/16)    Time 3   Period Weeks   Status On-going     PT LONG TERM GOAL #2   Title demo Rt UE strength =/> 5-/5 to allow return to normal activities ( 04/29/16)    Time 3   Period Weeks   Status Partially Met     PT LONG TERM GOAL #3   Title lift his bag for work without difficulty ( 04/29/16)    Time 3   Period Weeks   Status On-going     PT LONG TERM GOAL #4   Title return to exercise at [employer] ( 04/29/16)    Time 3   Period Weeks   Status On-going     PT LONG TERM GOAL #5   Title improve FOTO =/< 33% limited ( 04/29/16)    Time 3   Period Weeks    Status New               Plan - 04/10/16 1635    Clinical Impression Statement Today focus was on strengthening of shoulder with similar machines with intent to return to gym.  Pt tolerated all without increase in symptoms.  Pt making great gains for remaining goals.    Rehab Potential Excellent   PT Frequency 2x / week   PT Duration 3 weeks   PT Treatment/Interventions Moist Heat;Ultrasound;Therapeutic exercise;Dry needling;Taping;Manual techniques;Neuromuscular re-education;Cryotherapy;Electrical Stimulation;Iontophoresis 80m/ml Dexamethasone;Passive range of motion;Vasopneumatic Device   PT Next Visit Plan Continue progressive strengthening .    Consulted and Agree with Plan of Care Patient      Patient will benefit from skilled therapeutic intervention in order to improve the following deficits and impairments:  Postural dysfunction, Decreased strength, Pain, Impaired UE functional use, Decreased range of motion  Visit Diagnosis: Stiffness of right shoulder, not elsewhere classified  Muscle weakness (generalized)  Abnormal posture  Right shoulder pain, unspecified chronicity     Problem List Patient Active Problem List   Diagnosis Date Noted  . Hyperlipidemia 03/10/2012   JKerin Perna PTA 04/10/16 4:48 PM  [place] [address]Phone: [phone]  Fax:  [phone] Name: Earl HawkerMRN: [id number] of Birth: [birth year]

## 2016-04-17 ENCOUNTER — Ambulatory Visit (INDEPENDENT_AMBULATORY_CARE_PROVIDER_SITE_OTHER): Payer: BLUE CROSS/BLUE SHIELD | Admitting: Physical Therapy

## 2016-04-17 DIAGNOSIS — M25511 Pain in right shoulder: Secondary | ICD-10-CM | POA: Diagnosis not present

## 2016-04-17 DIAGNOSIS — M25611 Stiffness of right shoulder, not elsewhere classified: Secondary | ICD-10-CM | POA: Diagnosis not present

## 2016-04-17 DIAGNOSIS — M6281 Muscle weakness (generalized): Secondary | ICD-10-CM | POA: Diagnosis not present

## 2016-04-17 DIAGNOSIS — R293 Abnormal posture: Secondary | ICD-10-CM | POA: Diagnosis not present

## 2016-04-17 NOTE — Therapy (Signed)
[place], [place], [address] Phone: [phone]   Fax:  [phone]  Physical Therapy Treatment  Patient Details  Name: Earl Thomas MRN: [id number] Date of Birth: 05-27-1951 Referring Provider: Dr.  Justice Britain   Encounter Date: 04/17/2016      PT End of Session - 04/17/16 1621    Visit Number 18   Number of Visits 22   Date for PT Re-Evaluation 04/29/16   PT Start Time 1603   PT Stop Time 1657   PT Time Calculation (min) 54 min   Activity Tolerance Patient tolerated treatment well   Behavior During Therapy [facility] for tasks assessed/performed      Past Medical History:  Diagnosis Date  . Arthritis   . Hyperlipidemia   . 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    Past Surgical History:  Procedure Laterality Date  . ROTATOR CUFF REPAIR Right 01/02/16    There were no vitals filed for this visit.      Subjective Assessment - 04/17/16 1622    Subjective Pt reports he has been released from his MD's care.  He continues to have difficulty lifting.  Otherwise no new changes.  He is pleased with progress thus far.    Patient Stated Goals lift his arm over his head, carry items  - lift his bag ( ~ 20#) exercise at [employer].    Currently in Pain? No/denies   Pain Score 0-No pain            [facility] PT Assessment - 04/17/16 0001      Assessment   Medical Diagnosis Rt RTC repair   Referring Provider Dr.  Justice Britain    Onset Date/Surgical Date 01/02/16   Hand Dominance Right   Next MD Visit PRN     AROM   Right Shoulder Extension 45 Degrees   Right Shoulder Flexion 160 Degrees  152 deg in standing    Right Shoulder ABduction --  140 deg in standing   Right Shoulder External Rotation 85 Degrees  90 with over pressure     Strength   Strength Assessment Site Shoulder;Elbow   Right/Left Shoulder Right   Right Shoulder Flexion 4/5  with pain    Right Shoulder ABduction 4/5  with pain   Right Shoulder Internal Rotation 5/5   Right Shoulder External Rotation --  5-/5   Right Elbow Flexion 5/5   Right Elbow Extension 5/5         [facility] Adult PT Treatment/Exercise - 04/17/16 0001      Elbow Exercises   Elbow Flexion Right;10 reps;Theraband  green band      Shoulder Exercises: Standing   External Rotation Strengthening;Both;10 reps;Theraband   Theraband Level (Shoulder External Rotation) Level 2 (Red)   Flexion Right;10 reps;Weights   Shoulder Flexion Weight (lbs) 3#   ABduction Strengthening;Right;10 reps;Weights   Shoulder ABduction Weight (lbs) 2#   Row Strengthening;Both;12 reps;Theraband   Theraband Level (Shoulder Row) Level 3 (Green)   Other Standing Exercises Sash with yellow band x 10 reps, with 1# wt with 8 reps      Shoulder Exercises: Pulleys   Flexion 3 minutes   ABduction 3 minutes   Other Pulley Exercises behind the back IR RUE x 2'     Shoulder Exercises: Stretch   Other Shoulder Stretches Doorway stretch - 3 positions, each 30 seconds.      Vasopneumatic   Number Minutes [id number]  47  minutes   Vasopnuematic Location  Shoulder  Rt   Vasopneumatic Pressure Low                  PT Short Term Goals - 03/13/16 1640      PT SHORT TERM GOAL #1   Title I with initial HEP ( 03/14/16)    Time 4   Period Weeks   Status Achieved     PT SHORT TERM GOAL #2   Title demo Rt shoulder ROM WFL to perform upper body dressing per his previous level ( 03/14/16)    Time 4   Period Weeks   Status Achieved     PT SHORT TERM GOAL #3   Title tolerate initial strengthening exercise Rt shoulder ( 03/14/16)    Time 4   Period Weeks   Status Achieved     PT SHORT TERM GOAL #4   Title Rt shoulder pain =/< 2/10 with dailly activity ( 03/14/16)    Time 4   Period Weeks   Status Achieved     PT SHORT TERM GOAL #5   Title improve FOTO =/< 45% limited ( 03/14/16)    Time 4   Period Weeks   Status Achieved            PT Long Term Goals - 04/08/16 2951      PT LONG TERM GOAL #1   Title I with advanced HEP ( 04/29/16)    Time 3   Period Weeks   Status On-going     PT LONG TERM GOAL #2   Title demo Rt UE strength =/> 5-/5 to allow return to normal activities ( 04/29/16)    Time 3   Period Weeks   Status Partially Met     PT LONG TERM GOAL #3   Title lift his bag for work without difficulty ( 04/29/16)    Time 3   Period Weeks   Status On-going     PT LONG TERM GOAL #4   Title return to exercise at [employer] ( 04/29/16)    Time 3   Period Weeks   Status On-going     PT LONG TERM GOAL #5   Title improve FOTO =/< 33% limited ( 04/29/16)    Time 3   Period Weeks   Status New               Plan - 04/17/16 1651    Clinical Impression Statement Pt's Rt shoulder ROM remains similar to last 2 assessments; close to LUE measurements.  Pt demonstrated improved elbow strength; continues with weakness in Rt shoulder flexion/ abdct.  Pt tolerated exercises with minimal increase in pain.  Making good gains towards unmet goals.    Rehab Potential Excellent   PT Frequency 2x / week   PT Duration 3 weeks   PT Treatment/Interventions Moist Heat;Ultrasound;Therapeutic exercise;Dry needling;Taping;Manual techniques;Neuromuscular re-education;Cryotherapy;Electrical Stimulation;Iontophoresis 48m/ml Dexamethasone;Passive range of motion;Vasopneumatic Device   PT Next Visit Plan Continue progressive strengthening .    Consulted and Agree with Plan of Care Patient      Patient will benefit from skilled therapeutic intervention in order to improve the following deficits and impairments:  Postural dysfunction, Decreased strength, Pain, Impaired UE functional use, Decreased range of motion  Visit Diagnosis: Stiffness of right shoulder, not elsewhere classified  Muscle weakness (generalized)  Abnormal posture  Right shoulder pain, unspecified chronicity     Problem  List Patient Active Problem List   Diagnosis Date Noted  .  Hyperlipidemia 03/10/2012   Kerin Perna, PTA 04/17/16 4:55 PM  [facility] [place], [place], [address] Phone: [phone]   Fax:  [phone]  Name: Revis Whalin MRN: [id number] Date of Birth: 08/24/1950

## 2016-04-18 ENCOUNTER — Encounter: Payer: BLUE CROSS/BLUE SHIELD | Admitting: Physical Therapy

## 2016-04-22 ENCOUNTER — Ambulatory Visit (INDEPENDENT_AMBULATORY_CARE_PROVIDER_SITE_OTHER): Payer: BLUE CROSS/BLUE SHIELD | Admitting: Physical Therapy

## 2016-04-22 DIAGNOSIS — M6281 Muscle weakness (generalized): Secondary | ICD-10-CM

## 2016-04-22 DIAGNOSIS — M25611 Stiffness of right shoulder, not elsewhere classified: Secondary | ICD-10-CM

## 2016-04-22 DIAGNOSIS — R293 Abnormal posture: Secondary | ICD-10-CM

## 2016-04-22 NOTE — Therapy (Signed)
[name], [place], [address] Phone: [phone]   Fax:  [phone]  Physical Therapy Treatment  Patient Details  Name: Earl Thomas MRN: [id number] Date of Birth: Nov 05, 1950 Referring Provider: Dr. Justice Britain  Encounter Date: 04/22/2016      PT End of Session - 04/22/16 0723    Visit Number 19   Number of Visits 22   Date for PT Re-Evaluation 04/29/16   PT Start Time 0720   PT Stop Time 0815   PT Time Calculation (min) 55 min   Activity Tolerance Patient tolerated treatment well;No increased pain   Behavior During Therapy WFL for tasks assessed/performed      Past Medical History:  Diagnosis Date  . Arthritis   . Hyperlipidemia   . 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    Past Surgical History:  Procedure Laterality Date  . ROTATOR CUFF REPAIR Right 01/02/16    There were no vitals filed for this visit.      Subjective Assessment - 04/22/16 0724    Subjective Pt reports he had some pain and difficulty with sidelying abduction with 1# weight.  He used ice to calm it down.     Patient Stated Goals lift his arm over his head, carry items  - lift his bag ( ~ 20#) exercise at [employer].    Currently in Pain? No/denies            [facility] PT Assessment - 04/22/16 0001      Assessment   Medical Diagnosis Rt RTC repair   Referring Provider Dr. Justice Britain   Onset Date/Surgical Date 01/02/16   Hand Dominance Right   Next MD Visit PRN           [facility] Adult PT Treatment/Exercise - 04/22/16 0001      Shoulder Exercises: Seated   Row Both;10 reps;Weights  2 sets, cables.  2 plates    Other Seated Exercises Chest press 1 plate x 10 reps, 2 plates x 5 reps x 2 sets   Other Seated Exercises Lat pull down x 3 plates x 10 reps      Shoulder Exercises: Standing   External Rotation Strengthening;Both;Theraband;20 reps   Theraband Level  (Shoulder External Rotation) Level 2 (Red)   Other Standing Exercises lift and carry 30 ft - 9#, 11#, 15#, 18#) -no pain.       Shoulder Exercises: ROM/Strengthening   UBE (Upper Arm Bike) L3 x 4 min alt FWD/BWD standing      Shoulder Exercises: Stretch   Other Shoulder Stretches Doorway stretch - 3 positions, each 30 seconds, 3 sets.   Cross chest stretch 30 sec x 2 reps      Shoulder Exercises: Body Blade   Flexion 15 seconds;30 seconds;2 reps   ABduction 15 seconds;2 reps   External Rotation 15 seconds;2 reps   Internal Rotation 15 seconds;2 reps     Vasopneumatic   Number Minutes Vasopneumatic  15 minutes   Vasopnuematic Location  Shoulder  Rt   Vasopneumatic Pressure Low           PT Short Term Goals - 03/13/16 1640      PT SHORT TERM GOAL #1   Title I with initial HEP ( 03/14/16)    Time 4   Period Weeks   Status Achieved     PT SHORT TERM GOAL #2   Title demo Rt shoulder ROM WFL to perform upper body  dressing per his previous level ( 03/14/16)    Time 4   Period Weeks   Status Achieved     PT SHORT TERM GOAL #3   Title tolerate initial strengthening exercise Rt shoulder ( 03/14/16)    Time 4   Period Weeks   Status Achieved     PT SHORT TERM GOAL #4   Title Rt shoulder pain =/< 2/10 with dailly activity ( 03/14/16)    Time 4   Period Weeks   Status Achieved     PT SHORT TERM GOAL #5   Title improve FOTO =/< 45% limited ( 03/14/16)    Time 4   Period Weeks   Status Achieved           PT Long Term Goals - 04/22/16 0712      PT LONG TERM GOAL #1   Title I with advanced HEP ( 04/29/16)    Time 3   Period Weeks   Status On-going     PT LONG TERM GOAL #2   Title demo Rt UE strength =/> 5-/5 to allow return to normal activities ( 04/29/16)    Time 3   Period Weeks   Status Partially Met     PT LONG TERM GOAL #3   Title lift his bag for work without difficulty ( 04/29/16)    Time 3   Period Weeks   Status On-going     PT LONG TERM GOAL #4    Title return to exercise at [employer] ( 04/29/16)    Time 3   Period Weeks   Status On-going     PT LONG TERM GOAL #5   Title improve FOTO =/< 33% limited ( 04/29/16)    Time 3   Period Weeks   Status On-going               Plan - 04/22/16 0813    Clinical Impression Statement Pt able to tolerate lifting 18.5 # without pain or difficulty.  He will try lifting his bag at work and also return to gym this week LTG #3 and 4.  He tolerated all exercises well, without any notable pain.  Progressing well towards remaining goals.    Rehab Potential Excellent   PT Frequency 2x / week   PT Duration 3 weeks   PT Treatment/Interventions Moist Heat;Ultrasound;Therapeutic exercise;Dry needling;Taping;Manual techniques;Neuromuscular re-education;Cryotherapy;Electrical Stimulation;Iontophoresis 22m/ml Dexamethasone;Passive range of motion;Vasopneumatic Device   PT Next Visit Plan Continue progressive strengthening .    Consulted and Agree with Plan of Care Patient      Patient will benefit from skilled therapeutic intervention in order to improve the following deficits and impairments:  Postural dysfunction, Decreased strength, Pain, Impaired UE functional use, Decreased range of motion  Visit Diagnosis: Stiffness of right shoulder, not elsewhere classified  Muscle weakness (generalized)  Abnormal posture     Problem List Patient Active Problem List   Diagnosis Date Noted  . Hyperlipidemia 03/10/2012   JKerin Perna PTA 04/22/16 8:20 AM  [place] [address]Phone: [phone]  Fax:  [phone] Name: MKeil PickeringMRN: [id number] of Birth: [birth year]

## 2016-04-23 ENCOUNTER — Ambulatory Visit (INDEPENDENT_AMBULATORY_CARE_PROVIDER_SITE_OTHER): Payer: BLUE CROSS/BLUE SHIELD | Admitting: Family Medicine

## 2016-04-23 ENCOUNTER — Encounter: Payer: Self-pay | Admitting: Family Medicine

## 2016-04-23 VITALS — BP 116/82 | HR 83 | Temp 98.5°F | Resp 16 | Wt 158.0 lb

## 2016-04-23 DIAGNOSIS — E7800 Pure hypercholesterolemia, unspecified: Secondary | ICD-10-CM

## 2016-04-23 DIAGNOSIS — Z23 Encounter for immunization: Secondary | ICD-10-CM

## 2016-04-23 DIAGNOSIS — N289 Disorder of kidney and ureter, unspecified: Secondary | ICD-10-CM

## 2016-04-23 DIAGNOSIS — R7302 Impaired glucose tolerance (oral): Secondary | ICD-10-CM

## 2016-04-23 LAB — CBC WITH DIFFERENTIAL/PLATELET
Basophils Absolute: 52 cells/uL (ref 0–200)
Basophils Relative: 1 %
Eosinophils Absolute: 156 cells/uL (ref 15–500)
Eosinophils Relative: 3 %
HCT: 47.1 % (ref 38.5–50.0)
Hemoglobin: 15.5 g/dL (ref 13.2–17.1)
Lymphocytes Relative: 38 %
Lymphs Abs: 1976 cells/uL (ref 850–3900)
MCH: 27.9 pg (ref 27.0–33.0)
MCHC: 32.9 g/dL (ref 32.0–36.0)
MCV: 84.7 fL (ref 80.0–100.0)
MPV: 10 fL (ref 7.5–12.5)
Monocytes Absolute: 416 cells/uL (ref 200–950)
Monocytes Relative: 8 %
Neutro Abs: 2600 cells/uL (ref 1500–7800)
Neutrophils Relative %: 50 %
Platelets: 183 10*3/uL (ref 140–400)
RBC: 5.56 MIL/uL (ref 4.20–5.80)
RDW: 14.3 % (ref 11.0–15.0)
WBC: 5.2 10*3/uL (ref 3.8–10.8)

## 2016-04-23 MED ORDER — ATORVASTATIN CALCIUM 10 MG PO TABS: 10.0000 mg | ORAL_TABLET | Freq: Every day | ORAL | 1 refills | Status: DC

## 2016-04-23 MED ORDER — MELOXICAM 15 MG PO TABS: 15.0000 mg | ORAL_TABLET | Freq: Every day | ORAL | 0 refills | Status: DC

## 2016-04-23 NOTE — Patient Instructions (Signed)
     IF you received an x-ray today, you will receive an invoice from [facility] Radiology. Please contact [facility] at [phone] with questions or concerns regarding your invoice.   IF you received labwork today, you will receive an invoice from [employer]. Please contact [facility] at [phone] with questions or concerns regarding your invoice.   Our billing staff will not be able to assist you with questions regarding bills from these companies.  You will be contacted with the lab results as soon as they are available. The fastest way to get your results is to activate your My Chart account. Instructions are located on the last page of this paperwork. If you have not heard from [place] regarding the results in 2 weeks, please contact this office.

## 2016-04-23 NOTE — Progress Notes (Signed)
Subjective:    Patient ID: Earl Thomas, male    DOB: 10-30-1950, 65 y.o.   MRN: [id number]  04/23/2016  Follow-up (diabetes)   HPI This 65 y.o. male presents for three month follow-up for glucose intolerance.    R rotator cuff surgery: s/p surgery by [facility] in 12/2015; undergoing physical therapy twice weekly; will graduate in two weeks.  Performing home exercise program.   Glucose intolerance: glucose 116 in 01/2016.  HgbA1c 5.9 in 01/2016.  Down four pounds; cut out tea. B: cereal raisin bran or cheerios.  Coco crispy.  Grits; [place] bacon. Sausage.  Boiled egg.wheat toast. Orange juice eight ounces and coffee.   Snack: none Lunch: subway [place] club, baked chips, cheerwine Snack: none Supper: baked chicken, brown rice, green beans, collards, roll or cornbread, [place] burger, fish, boneless pork chops baked.  Water or coolaid.   Snack: cake  Wt Readings from Last 3 Encounters:  04/23/16 158 lb (71.7 kg)  01/11/16 164 lb 3.2 oz (74.5 kg)  11/06/15 162 lb 12.8 oz (73.8 kg)    BP Readings from Last 3 Encounters:  04/23/16 116/82  01/11/16 106/66  11/06/15 118/70    Renal insufficiency: creatinine 1.27 at last visit; due for repeat.   Review of Systems  Constitutional: Negative for activity change, appetite change, chills, diaphoresis, fatigue and fever.  Eyes: Negative for visual disturbance.  Respiratory: Negative for cough and shortness of breath.   Cardiovascular: Negative for chest pain, palpitations and leg swelling.  Endocrine: Negative for cold intolerance, heat intolerance, polydipsia, polyphagia and polyuria.  Neurological: Negative for dizziness, tremors, seizures, syncope, facial asymmetry, speech difficulty, weakness, light-headedness, numbness and headaches.    Past Medical History:  Diagnosis Date  . Arthritis   . Glucose intolerance (impaired glucose tolerance)   . Hyperlipidemia   . 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     Past Surgical History:  Procedure Laterality Date  . ROTATOR CUFF REPAIR Right 01/02/16   No Known Allergies Current Outpatient Prescriptions  Medication Sig Dispense Refill  . aspirin 325 MG tablet Take 325 mg by mouth daily.    Marland Kitchen atorvastatin (LIPITOR) 10 MG tablet Take 1 tablet (10 mg total) by mouth daily at 6 PM. 90 tablet 1  . meloxicam (MOBIC) 15 MG tablet Take 1 tablet (15 mg total) by mouth daily. 30 tablet 0  . oxyCODONE-acetaminophen (PERCOCET/ROXICET) 5-325 MG tablet   0  . PROCTOSOL HC 2.5 % rectal cream APPLY ONE APPLICATORFUL RECTALLY TWICE DAILY 29 g 3  . azelastine (ASTELIN) 0.1 % nasal spray Place 2 sprays into both nostrils 2 (two) times daily. Use in each nostril as directed (Patient not taking: Reported on 04/23/2016) 30 mL 1  . diazepam (VALIUM) 5 MG tablet   0  . ondansetron (ZOFRAN) 4 MG tablet   0  . traMADol (ULTRAM) 50 MG tablet Take 1 tablet (50 mg total) by mouth every 6 (six) hours as needed. (Patient not taking: Reported on 04/23/2016) 20 tablet 0   No current facility-administered medications for this visit.    Social History   Social History  . Marital status: Married    Spouse name: N/A  . Number of children: N/A  . Years of education: N/A   Occupational History  . Professor    Social History Main Topics  . Smoking status: Never Smoker  . Smokeless tobacco: Not on file  . Alcohol use No  . Drug use: No  . Sexual activity: Not  on file   Other Topics Concern  . 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      Tobacco: pipe in 1980s      Alcohol: none      Exercise: joined [employer]; going 2-3 times per week.        Seatbelt:  100%   Family History  Problem  Relation Age of Onset  . Cancer Father 38    prostate cancer  . Diabetes Father   . Heart disease Father 70    AMI late 74s  . Cancer Mother 15    Breast cancer  . Heart disease Mother     CABG at age 73  . Diabetes Brother   . Heart disease Brother     AMI x 2  . Hyperlipidemia Brother        Objective:    BP 116/82 (BP Location: Left Arm, Patient Position: Sitting, Cuff Size: Normal)   Pulse 83   Temp 98.5 F (36.9 C)   Resp 16   Wt 158 lb (71.7 kg)   SpO2 96%   BMI 26.70 kg/m  Physical Exam  Constitutional: He is oriented to person, place, and time. He appears well-developed and well-nourished. No distress.  HENT:  Head: Normocephalic and atraumatic.  Right Ear: External ear normal.  Left Ear: External ear normal.  Nose: Nose normal.  Mouth/Throat: Oropharynx is clear and moist.  Eyes: Conjunctivae and EOM are normal. Pupils are equal, round, and reactive to light.  Neck: Normal range of motion. Neck supple. Carotid bruit is not present. No thyromegaly present.  Cardiovascular: Normal rate, regular rhythm, normal heart sounds and intact distal pulses.  Exam reveals no gallop and no friction rub.   No murmur heard. Pulmonary/Chest: Effort normal and breath sounds normal. He has no wheezes. He has no rales.  Abdominal: Soft. Bowel sounds are normal. He exhibits no distension and no mass. There is no tenderness. There is no rebound and no guarding.  Lymphadenopathy:    He has no cervical adenopathy.  Neurological: He is alert and oriented to person, place, and time. No cranial nerve deficit.  Skin: Skin is warm and dry. No rash noted. He is not diaphoretic.  Psychiatric: He has a normal mood and affect. His behavior is normal.  Nursing note and vitals reviewed.  Results for orders placed or performed in visit on 04/23/16  CBC with Differential/Platelet  Result Value Ref Range   WBC 5.2 3.8 - 10.8 K/uL   RBC 5.56 4.20 - 5.80 MIL/uL   Hemoglobin 15.5 13.2 - 17.1  g/dL   HCT 47.1 38.5 - 50.0 %   MCV 84.7 80.0 - 100.0 fL   MCH 27.9 27.0 - 33.0 pg   MCHC 32.9 32.0 - 36.0 g/dL   RDW 14.3 11.0 - 15.0 %   Platelets 183 140 - 400 K/uL   MPV 10.0 7.5 - 12.5 fL   Neutro Abs 2,600 1,500 - 7,800 cells/uL   Lymphs Abs 1,976 850 - 3,900 cells/uL   Monocytes Absolute 416 200 - 950 cells/uL   Eosinophils Absolute 156 15 - 500 cells/uL   Basophils Absolute 52 0 - 200 cells/uL  Neutrophils Relative % 50 %   Lymphocytes Relative 38 %   Monocytes Relative 8 %   Eosinophils Relative 3 %   Basophils Relative 1 %   Smear Review Criteria for review not met   Comprehensive metabolic panel  Result Value Ref Range   Sodium 141 135 - 146 mmol/L   Potassium 4.2 3.5 - 5.3 mmol/L   Chloride 103 98 - 110 mmol/L   CO2 27 20 - 31 mmol/L   Glucose, Bld 87 65 - 99 mg/dL   BUN 12 7 - 25 mg/dL   Creat 1.21 0.70 - 1.25 mg/dL   Total Bilirubin 0.9 0.2 - 1.2 mg/dL   Alkaline Phosphatase 58 40 - 115 U/L   AST 19 10 - 35 U/L   ALT 22 9 - 46 U/L   Total Protein 7.3 6.1 - 8.1 g/dL   Albumin 4.4 3.6 - 5.1 g/dL   Calcium 9.7 8.6 - 10.3 mg/dL  Lipid panel  Result Value Ref Range   Cholesterol 171 125 - 200 mg/dL   Triglycerides 77 <150 mg/dL   HDL 42 >=40 mg/dL   Total CHOL/HDL Ratio 4.1 <=5.0 Ratio   VLDL 15 <30 mg/dL   LDL Cholesterol 114 <130 mg/dL  Hemoglobin A1c  Result Value Ref Range   Hgb A1c MFr Bld 5.6 <5.7 %   Mean Plasma Glucose 114 mg/dL       Assessment & Plan:   1. Pure hypercholesterolemia   2. Glucose intolerance (impaired glucose tolerance)   3. Renal insufficiency   4. Need for influenza vaccination    -controlled; obtain labs; refills provided. -refill of Meloxicam for one month provided only due to renal insufficiency.   Orders Placed This Encounter  Procedures  . Flu Vaccine QUAD 36+ mos IM  . CBC with Differential/Platelet  . Comprehensive metabolic panel    Order Specific Question:   Has the patient fasted?    Answer:   Yes  .  Lipid panel    Order Specific Question:   Has the patient fasted?    Answer:   Yes  . Hemoglobin A1c   Meds ordered this encounter  Medications  . meloxicam (MOBIC) 15 MG tablet    Sig: Take 1 tablet (15 mg total) by mouth daily.    Dispense:  30 tablet    Refill:  0  . atorvastatin (LIPITOR) 10 MG tablet    Sig: Take 1 tablet (10 mg total) by mouth daily at 6 PM.    Dispense:  90 tablet    Refill:  1    Return in about 6 months (around 10/22/2016) for recheck high cholesterol and blood sugar.   Florine Sprenkle Elayne Guerin, M.D. [facility] [address] [place],   [address] [phone] phone [phone] fax

## 2016-04-24 ENCOUNTER — Encounter: Payer: BLUE CROSS/BLUE SHIELD | Admitting: Rehabilitative and Restorative Service Providers"

## 2016-04-24 LAB — COMPREHENSIVE METABOLIC PANEL
ALT: 22 U/L (ref 9–46)
AST: 19 U/L (ref 10–35)
Albumin: 4.4 g/dL (ref 3.6–5.1)
Alkaline Phosphatase: 58 U/L (ref 40–115)
BUN: 12 mg/dL (ref 7–25)
CO2: 27 mmol/L (ref 20–31)
Calcium: 9.7 mg/dL (ref 8.6–10.3)
Chloride: 103 mmol/L (ref 98–110)
Creat: 1.21 mg/dL (ref 0.70–1.25)
Glucose, Bld: 87 mg/dL (ref 65–99)
Potassium: 4.2 mmol/L (ref 3.5–5.3)
Sodium: 141 mmol/L (ref 135–146)
Total Bilirubin: 0.9 mg/dL (ref 0.2–1.2)
Total Protein: 7.3 g/dL (ref 6.1–8.1)

## 2016-04-24 LAB — HEMOGLOBIN A1C
Hgb A1c MFr Bld: 5.6 % (ref <5.7)
Mean Plasma Glucose: 114 mg/dL

## 2016-04-24 LAB — LIPID PANEL
Cholesterol: 171 mg/dL (ref 125–200)
HDL: 42 mg/dL (ref >=40)
LDL Cholesterol: 114 mg/dL (ref <130)
Total CHOL/HDL Ratio: 4.1 Ratio (ref <=5.0)
Triglycerides: 77 mg/dL (ref <150)
VLDL: 15 mg/dL (ref <30)

## 2016-04-29 ENCOUNTER — Encounter: Payer: Self-pay | Admitting: Family Medicine

## 2016-04-29 ENCOUNTER — Ambulatory Visit (INDEPENDENT_AMBULATORY_CARE_PROVIDER_SITE_OTHER): Payer: BLUE CROSS/BLUE SHIELD | Admitting: Rehabilitative and Restorative Service Providers"

## 2016-04-29 ENCOUNTER — Encounter: Payer: Self-pay | Admitting: Rehabilitative and Restorative Service Providers"

## 2016-04-29 DIAGNOSIS — N289 Disorder of kidney and ureter, unspecified: Secondary | ICD-10-CM | POA: Insufficient documentation

## 2016-04-29 DIAGNOSIS — M6281 Muscle weakness (generalized): Secondary | ICD-10-CM

## 2016-04-29 DIAGNOSIS — M25511 Pain in right shoulder: Secondary | ICD-10-CM | POA: Diagnosis not present

## 2016-04-29 DIAGNOSIS — R293 Abnormal posture: Secondary | ICD-10-CM | POA: Diagnosis not present

## 2016-04-29 DIAGNOSIS — M25611 Stiffness of right shoulder, not elsewhere classified: Secondary | ICD-10-CM

## 2016-04-29 DIAGNOSIS — R7302 Impaired glucose tolerance (oral): Secondary | ICD-10-CM | POA: Insufficient documentation

## 2016-04-29 HISTORY — DX: Disorder of kidney and ureter, unspecified: N28.9

## 2016-04-29 NOTE — Therapy (Signed)
[facility] [place], [place], [address] Phone: [phone]   Fax:  [phone]  Physical Therapy Treatment  Patient Details  Name: Earl Thomas MRN: [id number] Date of Birth: [birth year] Referring Provider: Dr Justice Britain  Encounter Date: 04/29/2016      PT End of Session - 04/29/16 0722    Visit Number 22   Number of Visits 22   Date for PT Re-Evaluation 05/13/16   PT Start Time 6384   PT Stop Time 0808   PT Time Calculation (min) 51 min   Activity Tolerance Patient tolerated treatment well      Past Medical History:  Diagnosis Date  . Arthritis   . Hyperlipidemia   . 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    Past Surgical History:  Procedure Laterality Date  . ROTATOR CUFF REPAIR Right 01/02/16    There were no vitals filed for this visit.      Subjective Assessment - 04/29/16 0722    Subjective Some pain one day last week - probbbly from something he did but he is not sure what that was. Generally feeling "pretty good'. Has not been to the gym yet.    Currently in Pain? No/denies            [facility] PT Assessment - 04/29/16 0001      Assessment   Medical Diagnosis Rt RTC repair   Referring Provider Dr Justice Britain   Onset Date/Surgical Date 01/02/16   Hand Dominance Right   Next MD Visit PRN released from MD care      AROM   AROM Assessment Site --  AROM assessed in standing    Right Shoulder Extension 53 Degrees   Right Shoulder Flexion 155 Degrees   Right Shoulder ABduction 147 Degrees   Right Shoulder Internal Rotation 46 Degrees   Right Shoulder External Rotation 92 Degrees     Strength   Strength Assessment Site Shoulder;Elbow   Right/Left Shoulder Right   Right Shoulder Flexion 4+/5  mild pian    Right Shoulder Extension 5/5   Right Shoulder ABduction 4+/5  mild pain    Right Shoulder Internal Rotation 5/5   Right Shoulder External  Rotation 5/5   Right Elbow Flexion 5/5   Right Elbow Extension 5/5                     [facility] Adult PT Treatment/Exercise - 04/29/16 0001      Shoulder Exercises: Standing   ABduction Strengthening;Right;10 reps;Weights  back at noodle hold 2-3 sec then focus on eccentric lowering   Shoulder ABduction Weight (lbs) 2#   Other Standing Exercises wall push up with 4 in ball in Rt hand x 10 2 sets      Shoulder Exercises: Pulleys   Flexion --  10 sec hold x 10   ABduction --  10 sec hold x 10      Shoulder Exercises: ROM/Strengthening   UBE (Upper Arm Bike) L3 x 4 min alt FWD/BWD standing      Shoulder Exercises: Body Blade   Flexion 30 seconds;3 reps   ABduction 30 seconds;3 reps     Vasopneumatic   Number Minutes Vasopneumatic  15 minutes   Vasopnuematic Location  Shoulder  Rt   Vasopneumatic Pressure Low     Manual Therapy   Manual therapy comments pt supine    Joint Mobilization Rt GH joint    Scapular Mobilization Rt  Passive ROM Rt shoulder ER, IR, and Abduction, flexioin, extension, horizontal abduction                  PT Education - 04/29/16 0757    Education provided Yes   Education Details wall push up with ball; eccentric lowering with scaption    Person(s) Educated Patient   Methods Explanation;Demonstration;Tactile cues;Verbal cues;Handout   Comprehension Verbalized understanding;Returned demonstration;Verbal cues required;Tactile cues required          PT Short Term Goals - 03/13/16 1640      PT SHORT TERM GOAL #1   Title I with initial HEP ( 03/14/16)    Time 4   Period Weeks   Status Achieved     PT SHORT TERM GOAL #2   Title demo Rt shoulder ROM WFL to perform upper body dressing per his previous level ( 03/14/16)    Time 4   Period Weeks   Status Achieved     PT SHORT TERM GOAL #3   Title tolerate initial strengthening exercise Rt shoulder ( 03/14/16)    Time 4   Period Weeks   Status Achieved     PT SHORT TERM GOAL  #4   Title Rt shoulder pain =/< 2/10 with dailly activity ( 03/14/16)    Time 4   Period Weeks   Status Achieved     PT SHORT TERM GOAL #5   Title improve FOTO =/< 45% limited ( 03/14/16)    Time 4   Period Weeks   Status Achieved           PT Long Term Goals - 04/29/16 0865      PT LONG TERM GOAL #1   Title I with advanced HEP ( 05/13/16)    Time 2   Period Weeks     PT LONG TERM GOAL #2   Title demo Rt UE strength =/> 5-/5 to allow return to normal activities ( 05/13/16)    Time 2   Period Weeks   Status Revised     PT LONG TERM GOAL #3   Title lift his bag for work without difficulty ( 04/29/16)    Time 3   Period Weeks   Status Achieved     PT LONG TERM GOAL #4   Title return to exercise at [employer] ( 05/13/16)    Time 2   Period Weeks   Status Revised     PT LONG TERM GOAL #5   Title improve FOTO =/< 33% limited ( 05/13/16)    Time 2   Period Weeks   Status Revised               Plan - 04/29/16 0757    Clinical Impression Statement Patient is progressing well with shoudler rehab. He has better PROM than AROM and cont to have decreased strength Rt shd flexion and abduction but continues to demo gains in strength and function. Patient needs to return to his gym program and work on HEP including stretching and strengthening to gain ROM and return to 5/5 strength and full function. Progressing well. Anticipate d/c in 1-2 visits.    Rehab Potential Excellent   PT Frequency 1x / week   PT Duration 2 weeks   PT Treatment/Interventions Moist Heat;Ultrasound;Therapeutic exercise;Dry needling;Taping;Manual techniques;Neuromuscular re-education;Cryotherapy;Electrical Stimulation;Iontophoresis 63m/ml Dexamethasone;Passive range of motion;Vasopneumatic Device   PT Next Visit Plan Continue progressive strengthening . anticipate d/c in 1-2 visits    Consulted and Agree with Plan of Care  Patient      Patient will benefit from skilled therapeutic intervention in  order to improve the following deficits and impairments:  Postural dysfunction, Decreased strength, Pain, Impaired UE functional use, Decreased range of motion  Visit Diagnosis: Stiffness of right shoulder, not elsewhere classified - Plan: PT plan of care cert/re-cert  Muscle weakness (generalized) - Plan: PT plan of care cert/re-cert  Abnormal posture - Plan: PT plan of care cert/re-cert  Right shoulder pain, unspecified chronicity - Plan: PT plan of care cert/re-cert     Problem List Patient Active Problem List   Diagnosis Date Noted  . Hyperlipidemia 03/10/2012    Sergei Delo Nilda Simmer PT, MPH  04/29/2016, 8:04 AM  [facility] [place], [place], [address] Phone: [phone]   Fax:  [phone]  Name: Hilary Pundt MRN: [id number] Date of Birth: 03-30-1951

## 2016-05-06 ENCOUNTER — Ambulatory Visit (INDEPENDENT_AMBULATORY_CARE_PROVIDER_SITE_OTHER): Payer: BLUE CROSS/BLUE SHIELD | Admitting: Physical Therapy

## 2016-05-06 DIAGNOSIS — M6281 Muscle weakness (generalized): Secondary | ICD-10-CM | POA: Diagnosis not present

## 2016-05-06 DIAGNOSIS — M25611 Stiffness of right shoulder, not elsewhere classified: Secondary | ICD-10-CM | POA: Diagnosis not present

## 2016-05-06 DIAGNOSIS — R293 Abnormal posture: Secondary | ICD-10-CM | POA: Diagnosis not present

## 2016-05-06 NOTE — Therapy (Signed)
[place]  [place], [place], [address] Phone: [phone]   Fax:  [phone]  Physical Therapy Treatment  Patient Details  Name: Earl Thomas MRN: [id number] Date of Birth: 11-Jun-1951 Referring Provider: Dr. Justice Britain   Encounter Date: 05/06/2016      PT End of Session - 05/06/16 0754    Visit Number 21  last visit number incorrectly documented.    Number of Visits 22   Date for PT Re-Evaluation 05/13/16   PT Start Time 0749   PT Stop Time 0834   PT Time Calculation (min) 45 min   Activity Tolerance Patient tolerated treatment well   Behavior During Therapy [facility] for tasks assessed/performed      Past Medical History:  Diagnosis Date  . Arthritis   . Glucose intolerance (impaired glucose tolerance)   . Hyperlipidemia   . 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    Past Surgical History:  Procedure Laterality Date  . ROTATOR CUFF REPAIR Right 01/02/16    There were no vitals filed for this visit.      Subjective Assessment - 05/06/16 0752    Subjective Pt reports he has been busy with Homecoming activities so he has not made it to gym yet.  He has continued to complete HEP at home.  He had some pain in shoulder yesterday, but believes it was weather related.      Patient Stated Goals lift his arm over his head, carry items  - lift his bag ( ~ 20#) exercise at [employer].    Currently in Pain? No/denies   Pain Score 0-No pain            [facility] PT Assessment - 05/06/16 0001      Assessment   Medical Diagnosis Rt RTC repair   Referring Provider Dr. Justice Britain    Onset Date/Surgical Date 01/02/16   Hand Dominance Right   Next MD Visit PRN released from MD care      Strength   Right Shoulder Flexion --  5-/5, "just a twinge"   Right Shoulder Extension 5/5   Right Shoulder ABduction 4-/5  with pain    Right Shoulder Internal Rotation 5/5   Right  Shoulder External Rotation --  5-/5          [facility] Adult PT Treatment/Exercise - 05/06/16 0001      Shoulder Exercises: Standing   External Rotation Both;10 reps;Theraband   Flexion Strengthening;Right;Weights;5 reps  back at noodle hold 2-3 sec then focus on eccentric lowering   Shoulder Flexion Weight (lbs) 3#, then 5 reps at 2#   ABduction Strengthening;Right;10 reps;Weights  back at noodle hold 2-3 sec then focus on eccentric lowering   Shoulder ABduction Weight (lbs) 2#   ABduction Limitations painful with 3# (performed 5 reps). Switched to 2#.    Row Strengthening;Both;15 reps;Theraband   Theraband Level (Shoulder Row) Level 4 (Blue)   Other Standing Exercises reverse wall push up x 10 reps      Shoulder Exercises: ROM/Strengthening   UBE (Upper Arm Bike) L4 x 3 min alt FWD/BWD standing    Wall Pushups 15 reps  with ball in Rt hand     Shoulder Exercises: Stretch   Cross Chest Stretch 1 rep;30 seconds   Other Shoulder Stretches Doorway stretch - 3 positions, each 30 seconds, 2 sets.    Other Shoulder Stretches shoulder extension stretch (hands laced behind back) x 30 sec  Shoulder Exercises: Body Blade   Flexion 30 seconds;2 reps   ABduction 30 seconds;2 reps     Vasopneumatic   Number Minutes Vasopneumatic  15 minutes   Vasopnuematic Location  Shoulder  Rt                  PT Short Term Goals - 03/13/16 1640      PT SHORT TERM GOAL #1   Title I with initial HEP ( 03/14/16)    Time 4   Period Weeks   Status Achieved     PT SHORT TERM GOAL #2   Title demo Rt shoulder ROM WFL to perform upper body dressing per his previous level ( 03/14/16)    Time 4   Period Weeks   Status Achieved     PT SHORT TERM GOAL #3   Title tolerate initial strengthening exercise Rt shoulder ( 03/14/16)    Time 4   Period Weeks   Status Achieved     PT SHORT TERM GOAL #4   Title Rt shoulder pain =/< 2/10 with dailly activity ( 03/14/16)    Time 4   Period Weeks    Status Achieved     PT SHORT TERM GOAL #5   Title improve FOTO =/< 45% limited ( 03/14/16)    Time 4   Period Weeks   Status Achieved           PT Long Term Goals - 05/06/16 0820      PT LONG TERM GOAL #1   Title I with advanced HEP ( 05/13/16)    Time 2   Period Weeks   Status On-going     PT LONG TERM GOAL #2   Title demo Rt UE strength =/> 5-/5 to allow return to normal activities ( 05/13/16)    Time 2   Period Weeks   Status Partially Met     PT LONG TERM GOAL #3   Title lift his bag for work without difficulty ( 04/29/16)    Time 3   Period Weeks   Status Achieved     PT LONG TERM GOAL #4   Title return to exercise at [employer] ( 05/13/16)    Time 2   Period Weeks   Status On-going     PT LONG TERM GOAL #5   Title improve FOTO =/< 33% limited ( 05/13/16)    Time 2   Period Weeks   Status On-going               Plan - 05/06/16 5397    Clinical Impression Statement Pt demonstrated weakness and pain with Rt shoulder abduction break test. Improved strength in Rt shoulder flexion. He continues to make gains towards remaining goals.    Rehab Potential Excellent   PT Frequency 1x / week   PT Duration 2 weeks   PT Treatment/Interventions Moist Heat;Ultrasound;Therapeutic exercise;Dry needling;Taping;Manual techniques;Neuromuscular re-education;Cryotherapy;Electrical Stimulation;Iontophoresis 69m/ml Dexamethasone;Passive range of motion;Vasopneumatic Device   PT Next Visit Plan Continue progressive strengthening . anticipate d/c next visit.    Consulted and Agree with Plan of Care Patient      Patient will benefit from skilled therapeutic intervention in order to improve the following deficits and impairments:  Postural dysfunction, Decreased strength, Pain, Impaired UE functional use, Decreased range of motion  Visit Diagnosis: Stiffness of right shoulder, not elsewhere classified  Muscle weakness (generalized)  Abnormal posture     Problem  List Patient Active Problem List   Diagnosis Date  Noted  . Renal insufficiency 04/29/2016  . Glucose intolerance (impaired glucose tolerance) 04/29/2016  . Hyperlipidemia 03/10/2012   Kerin Perna, PTA 05/06/16 8:23 AM  [place], [place], [address] Phone: [phone]   Fax:  [phone]  Name: Earl Thomas MRN: [id number] Date of Birth: [birth year]

## 2016-05-13 ENCOUNTER — Ambulatory Visit (INDEPENDENT_AMBULATORY_CARE_PROVIDER_SITE_OTHER): Payer: BLUE CROSS/BLUE SHIELD | Admitting: Physical Therapy

## 2016-05-13 DIAGNOSIS — M25611 Stiffness of right shoulder, not elsewhere classified: Secondary | ICD-10-CM | POA: Diagnosis not present

## 2016-05-13 DIAGNOSIS — M25511 Pain in right shoulder: Secondary | ICD-10-CM

## 2016-05-13 DIAGNOSIS — M6281 Muscle weakness (generalized): Secondary | ICD-10-CM

## 2016-05-13 DIAGNOSIS — R293 Abnormal posture: Secondary | ICD-10-CM | POA: Diagnosis not present

## 2016-05-13 NOTE — Therapy (Addendum)
[facility] [place], [place], [address] Phone: [phone]   Fax:  [phone]  Physical Therapy Treatment  Patient Details  Name: Earl Thomas MRN: [id number] Date of Birth: 1950/09/05 Referring Provider: Dr. Justice Britain   Encounter Date: 05/13/2016      PT End of Session - 05/13/16 0720    Visit Number 22   Number of Visits 22   Date for PT Re-Evaluation 05/13/16   PT Start Time 0715   PT Stop Time 0808   PT Time Calculation (min) 53 min   Activity Tolerance Patient tolerated treatment well      Past Medical History:  Diagnosis Date  . Arthritis   . Glucose intolerance (impaired glucose tolerance)   . Hyperlipidemia   . 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    Past Surgical History:  Procedure Laterality Date  . ROTATOR CUFF REPAIR Right 01/02/16    There were no vitals filed for this visit.      Subjective Assessment - 05/13/16 0720    Subjective Hawke states he went to gym last week and did some upper body lifting (10#).  No other changes since last visit.    Currently in Pain? No/denies   Pain Score 0-No pain            [facility] PT Assessment - 05/13/16 0001      Assessment   Medical Diagnosis Rt RTC repair   Referring Provider Dr. Justice Britain    Onset Date/Surgical Date 01/02/16   Hand Dominance Right   Next MD Visit PRN released from MD care      AROM   Right Shoulder Extension 53 Degrees   Right Shoulder Flexion 150 Degrees  standing   Right Shoulder ABduction 147 Degrees  in standing   Right Shoulder Internal Rotation 60 Degrees  supine    Right Shoulder External Rotation 90 Degrees  supine     Strength   Right Shoulder Flexion 4+/5   Right Shoulder Extension 5/5   Right Shoulder ABduction --  5-/5, with discomfort on break test   Right Shoulder Internal Rotation 5/5   Right Shoulder External Rotation --  5-/5                      [facility] Adult PT Treatment/Exercise - 05/13/16 0001      Shoulder Exercises: Seated   Other Seated Exercises Chest press 2 plates x 10 reps, 3 plates x 5 reps, 2 sets    Other Seated Exercises Lat pull down x 2 plates x 10 reps, repeated 3#      Shoulder Exercises: Standing   Flexion Strengthening;Right;Weights;5 reps  back at noodle hold 2-3 sec then focus on eccentric lowering   Shoulder Flexion Weight (lbs) yellow band x 10, 2# x 10   ABduction Strengthening;Right;10 reps;Weights  back at noodle hold 2-3 sec then focus on eccentric lowering   Shoulder ABduction Weight (lbs) 2#   ABduction Limitations  (performed 5 reps).   Row Strengthening;Both;15 reps;Theraband   Theraband Level (Shoulder Row) Level 4 (Blue)   Other Standing Exercises bicep curl with blue band x 10 reps each arm .   Other Standing Exercises reverse wall push up x 10 reps with 5 sec hold.      Shoulder Exercises: Pulleys   Flexion --  10 sec hold x 10   ABduction --  10 sec hold x 10  Shoulder Exercises: ROM/Strengthening   UBE (Upper Arm Bike) L4 x 3 min alt FWD/BWD standing      Shoulder Exercises: Stretch   Cross Chest Stretch 1 rep;30 seconds   Other Shoulder Stretches Doorway stretch - 3 positions, each 30 seconds, 2 sets.    Other Shoulder Stretches shoulder extension stretch (hands laced behind back) x 30 sec, lat stretch x 30 sec      Cryotherapy   Number Minutes Cryotherapy 12 Minutes   Cryotherapy Location Shoulder   Type of Cryotherapy Ice pack                  PT Short Term Goals - 03/13/16 1640      PT SHORT TERM GOAL #1   Title I with initial HEP ( 03/14/16)    Time 4   Period Weeks   Status Achieved     PT SHORT TERM GOAL #2   Title demo Rt shoulder ROM WFL to perform upper body dressing per his previous level ( 03/14/16)    Time 4   Period Weeks   Status Achieved     PT SHORT TERM GOAL #3   Title tolerate initial strengthening exercise  Rt shoulder ( 03/14/16)    Time 4   Period Weeks   Status Achieved     PT SHORT TERM GOAL #4   Title Rt shoulder pain =/< 2/10 with dailly activity ( 03/14/16)    Time 4   Period Weeks   Status Achieved     PT SHORT TERM GOAL #5   Title improve FOTO =/< 45% limited ( 03/14/16)    Time 4   Period Weeks   Status Achieved           PT Long Term Goals - 05/13/16 0727      PT LONG TERM GOAL #1   Title I with advanced HEP ( 05/13/16)    Time 12   Period Weeks   Status Achieved     PT LONG TERM GOAL #2   Title demo Rt UE strength =/> 5-/5 to allow return to normal activities ( 05/13/16)    Time 12   Period Weeks   Status Partially Met     PT LONG TERM GOAL #3   Title lift his bag for work without difficulty ( 04/29/16)    Time 12   Period Weeks   Status Achieved     PT LONG TERM GOAL #4   Title return to exercise at [employer] ( 05/13/16)    Time 12   Period Weeks   Status Achieved     PT LONG TERM GOAL #5   Title improve FOTO =/< 33% limited ( 05/13/16)    Time 12   Period Weeks   Status Achieved               Plan - 05/13/16 1432    Clinical Impression Statement Pt tolerated all exercises with minimal to no pain.  Pt has partially met his goals, but reports he is satisfied with current level of function; requests to d/c at this time.    Rehab Potential Excellent   PT Frequency 1x / week   PT Treatment/Interventions Moist Heat;Ultrasound;Therapeutic exercise;Dry needling;Taping;Manual techniques;Neuromuscular re-education;Cryotherapy;Electrical Stimulation;Iontophoresis 73m/ml Dexamethasone;Passive range of motion;Vasopneumatic Device   PT Next Visit Plan Spoke to supervising PT; Will d/c pt per his request.     Consulted and Agree with Plan of Care Patient      Patient will  benefit from skilled therapeutic intervention in order to improve the following deficits and impairments:  Postural dysfunction, Decreased strength, Pain, Impaired UE functional use,  Decreased range of motion  Visit Diagnosis: Stiffness of right shoulder, not elsewhere classified  Muscle weakness (generalized)  Abnormal posture  Right shoulder pain, unspecified chronicity     Problem List Patient Active Problem List   Diagnosis Date Noted  . Renal insufficiency 04/29/2016  . Glucose intolerance (impaired glucose tolerance) 04/29/2016  . Hyperlipidemia 03/10/2012   Kerin Perna, PTA 05/13/16 2:35 PM  [place], [place], [address] Phone: [phone]   Fax:  [phone]  Name: Keidrick Murty MRN: [id number] Date of Birth: October 13, 1950  PHYSICAL THERAPY DISCHARGE SUMMARY  Visits from Start of Care: 22  Current functional level related to goals / functional outcomes: See above for current function  Remaining deficits: Slight weakness in his upper body   Education / Equipment: HEP Plan: Patient agrees to discharge.  Patient goals were partially met. Patient is being discharged due to being pleased with the current functional level.  ?????     Jeral Pinch, PT 06/06/16 3:30 PM

## 2016-10-22 ENCOUNTER — Encounter: Payer: Self-pay | Admitting: Family Medicine

## 2016-10-22 ENCOUNTER — Ambulatory Visit (INDEPENDENT_AMBULATORY_CARE_PROVIDER_SITE_OTHER): Payer: BLUE CROSS/BLUE SHIELD | Admitting: Family Medicine

## 2016-10-22 VITALS — BP 112/71 | HR 71 | Temp 98.0°F | Resp 16 | Ht 64.5 in | Wt 156.6 lb

## 2016-10-22 DIAGNOSIS — N289 Disorder of kidney and ureter, unspecified: Secondary | ICD-10-CM | POA: Diagnosis not present

## 2016-10-22 DIAGNOSIS — Z6826 Body mass index (BMI) 26.0-26.9, adult: Secondary | ICD-10-CM

## 2016-10-22 DIAGNOSIS — R7302 Impaired glucose tolerance (oral): Secondary | ICD-10-CM | POA: Diagnosis not present

## 2016-10-22 DIAGNOSIS — Z114 Encounter for screening for human immunodeficiency virus [HIV]: Secondary | ICD-10-CM

## 2016-10-22 DIAGNOSIS — E7800 Pure hypercholesterolemia, unspecified: Secondary | ICD-10-CM | POA: Diagnosis not present

## 2016-10-22 DIAGNOSIS — J301 Allergic rhinitis due to pollen: Secondary | ICD-10-CM | POA: Diagnosis not present

## 2016-10-22 HISTORY — DX: Body mass index (BMI) 26.0-26.9, adult: Z68.26

## 2016-10-22 MED ORDER — ATORVASTATIN CALCIUM 10 MG PO TABS: 10.0000 mg | ORAL_TABLET | Freq: Every day | ORAL | 1 refills | Status: DC

## 2016-10-22 MED ORDER — MELOXICAM 15 MG PO TABS: 15.0000 mg | ORAL_TABLET | Freq: Every day | ORAL | 1 refills | Status: DC

## 2016-10-22 MED ORDER — AZELASTINE HCL 0.1 % NA SOLN: 2.0000 | Freq: Two times a day (BID) | NASAL | 5 refills | Status: DC

## 2016-10-22 NOTE — Progress Notes (Signed)
Subjective:    Patient ID: Earl Thomas, male    DOB: 1950/12/11, 66 y.o.   MRN: [id number]  10/22/2016  Follow-up (diabetes testing, cholesterol blood work)   HPI This 66 y.o. male presents for evaluation of glucose intolerance, hypercholesterolemia, pituitary adenoma, arthritis, allergic rhinitis, renal insufficiency.  Sneezing a lot.  Has lost weight since rotator cuff surgery; had to eat with LEFT hand.   Taking Meloxicam only as needed at this point.  R rotator cuff surgery: now having neck popping without pain; L shoulder hurting from overuse.  Graduated from physical therapy in fall 2017.  [employer] twice weekly.    Immunization History  Administered Date(s) Administered  . Influenza,inj,Quad PF,36+ Mos 05/30/2015, 04/23/2016  . Influenza-Unspecified 04/07/2014  . Tdap 02/13/2009  . Zoster 04/12/2014   BP Readings from Last 3 Encounters:  10/22/16 112/71  04/23/16 116/82  01/11/16 106/66   Wt Readings from Last 3 Encounters:  10/22/16 156 lb 9.6 oz (71 kg)  04/23/16 158 lb (71.7 kg)  01/11/16 164 lb 3.2 oz (74.5 kg)    Review of Systems  Constitutional: Negative for activity change, appetite change, chills, diaphoresis, fatigue and fever.  HENT: Positive for congestion and sneezing. Negative for postnasal drip, rhinorrhea, sinus pain, sinus pressure and sore throat.   Eyes: Negative for visual disturbance.  Respiratory: Positive for cough. Negative for shortness of breath.   Cardiovascular: Negative for chest pain, palpitations and leg swelling.  Gastrointestinal: Negative for abdominal pain, diarrhea, nausea and vomiting.  Endocrine: Negative for cold intolerance, heat intolerance, polydipsia, polyphagia and polyuria.  Musculoskeletal: Positive for arthralgias. Negative for neck pain and neck stiffness.  Skin: Negative for color change, rash and wound.  Neurological: Negative for dizziness, tremors, seizures, syncope, facial asymmetry, speech difficulty,  weakness, light-headedness, numbness and headaches.  Psychiatric/Behavioral: Negative for dysphoric mood and sleep disturbance. The patient is not nervous/anxious.     Past Medical History:  Diagnosis Date  . Arthritis   . Glucose intolerance (impaired glucose tolerance)   . Hyperlipidemia   . 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   Past Surgical History:  Procedure Laterality Date  . ROTATOR CUFF REPAIR Right 01/02/16   No Known Allergies  Social History   Social History  . Marital status: Married    Spouse name: N/A  . Number of children: N/A  . Years of education: N/A   Occupational History  . Professor    Social History Main Topics  . Smoking status: Never Smoker  . Smokeless tobacco: Never Used  . Alcohol use No  . Drug use: No  . Sexual activity: Not on file   Other Topics Concern  . 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      Tobacco: pipe in 1980s      Alcohol: none      Exercise: joined [employer]; going 2-3 times per week.        Seatbelt:  100%   Family History  Problem Relation Age of Onset  . Cancer Father 57    prostate cancer  . Diabetes Father   . Heart disease Father 70    AMI late 21s  .  Cancer Mother 95    Breast cancer  . Heart disease Mother     CABG at age 22  . Diabetes Brother   . Heart disease Brother     AMI x 2  . Hyperlipidemia Brother        Objective:    BP 112/71 (BP Location: Right Arm, Patient Position: Sitting, Cuff Size: Small)   Pulse 71   Temp 98 F (36.7 C) (Oral)   Resp 16   Ht 5' 4.5" (1.638 m)   Wt 156 lb 9.6 oz (71 kg)   SpO2 97%   BMI 26.47 kg/m  Physical Exam  Constitutional: He is  oriented to person, place, and time. He appears well-developed and well-nourished. No distress.  HENT:  Head: Normocephalic and atraumatic.  Right Ear: External ear normal.  Left Ear: External ear normal.  Nose: Nose normal.  Mouth/Throat: Oropharynx is clear and moist.  Eyes: Conjunctivae and EOM are normal. Pupils are equal, round, and reactive to light.  Neck: Normal range of motion. Neck supple. Carotid bruit is not present. No thyromegaly present.  Cardiovascular: Normal rate, regular rhythm, normal heart sounds and intact distal pulses.  Exam reveals no gallop and no friction rub.   No murmur heard. Pulmonary/Chest: Effort normal and breath sounds normal. He has no wheezes. He has no rales.  Abdominal: Soft. Bowel sounds are normal. He exhibits no distension and no mass. There is no tenderness. There is no rebound and no guarding.  Musculoskeletal:       Right shoulder: He exhibits decreased range of motion. He exhibits no pain, no spasm, normal pulse and normal strength.       Left shoulder: He exhibits decreased range of motion. He exhibits no pain, no spasm, normal pulse and normal strength.       Cervical back: Normal. He exhibits normal range of motion, no tenderness and no bony tenderness.  Lymphadenopathy:    He has no cervical adenopathy.  Neurological: He is alert and oriented to person, place, and time. No cranial nerve deficit.  Skin: Skin is warm and dry. No rash noted. He is not diaphoretic.  Psychiatric: He has a normal mood and affect. His behavior is normal.  Nursing note and vitals reviewed.   Depression screen [facility] 2/9 10/22/2016 04/23/2016 01/11/2016 11/06/2015 09/20/2015  Decreased Interest 0 0 0 0 0  Down, Depressed, Hopeless 0 0 0 0 0  PHQ - 2 Score 0 0 0 0 0   Fall Risk  10/22/2016 04/23/2016 01/11/2016 11/06/2015 02/22/2015  Falls in the past year? _0        Assessment & Plan:   1. Glucose intolerance (impaired glucose tolerance)   2. Renal  insufficiency   3. Pure hypercholesterolemia   4. Screening for HIV (human immunodeficiency virus)   5. BMI 26.0-26.9,adult   6. Seasonal allergic rhinitis due to pollen    -congratulations on weight loss which will help with glucose intolerance; continue with low-carbohydrate low-sugar food choices. -refill of Meloxicam provided but reemphasized importance of PRN use only due to renal insufficiency. -obtain labs; refill of Atorvastatin therapy provided. -refill of Astelin provided for acute allergic rhinitis. -continue to perform home exercise program for R rotator cuff repair.   Orders Placed This Encounter  Procedures  . CBC with Differential/Platelet  . Comprehensive metabolic panel    Order Specific Question:   Has the patient fasted?    Answer:   Yes  . Hemoglobin A1c  .  HIV antibody  . Lipid panel    Order Specific Question:   Has the patient fasted?    Answer:   Yes   Meds ordered this encounter  Medications  . atorvastatin (LIPITOR) 10 MG tablet    Sig: Take 1 tablet (10 mg total) by mouth daily at 6 PM.    Dispense:  90 tablet    Refill:  1  . meloxicam (MOBIC) 15 MG tablet    Sig: Take 1 tablet (15 mg total) by mouth daily.    Dispense:  30 tablet    Refill:  1  . azelastine (ASTELIN) 0.1 % nasal spray    Sig: Place 2 sprays into both nostrils 2 (two) times daily. Use in each nostril as directed    Dispense:  30 mL    Refill:  5    Return in about 6 months (around 04/23/2017) for complete physical examiniation.   Jariana Shumard Elayne Guerin, M.D. Primary Care at [facility] [address] [place], [place]  [phone] phone [phone] fax

## 2016-10-22 NOTE — Patient Instructions (Addendum)
IF you received an x-ray today, you will receive an invoice from [facility]. Please contact [facility] at [phone] with questions or concerns regarding your invoice.   IF you received labwork today, you will receive an invoice from [facility]. Please contact [facility] at [phone] with questions or concerns regarding your invoice.   Our billing staff will not be able to assist you with questions regarding bills from these companies.  You will be contacted with the lab results as soon as they are available. The fastest way to get your results is to activate your My Chart account. Instructions are located on the last page of this paperwork. If you have not heard from [place] regarding the results in 2 weeks, please contact this office.      Fat and Cholesterol Restricted Diet Getting too much fat and cholesterol in your diet may cause health problems. Following this diet helps keep your fat and cholesterol at normal levels. This can keep you from getting sick. What types of fat should I choose?  Choose monosaturated and polyunsaturated fats. These are found in foods such as olive oil, canola oil, flaxseeds, walnuts, almonds, and seeds.  Eat more omega-3 fats. Good choices include salmon, mackerel, sardines, tuna, flaxseed oil, and ground flaxseeds.  Limit saturated fats. These are in animal products such as meats, butter, and cream. They can also be in plant products such as palm oil, palm kernel oil, and coconut oil.  Avoid foods with partially hydrogenated oils in them. These contain trans fats. Examples of foods that have trans fats are stick margarine, some tub margarines, cookies, crackers, and other baked goods. What general guidelines do I need to follow?  Check food labels. Look for the words "trans fat" and "saturated fat."  When preparing a meal:  Fill half of your plate with vegetables and green salads.  Fill one fourth of your plate with whole  grains. Look for the word "whole" as the first word in the ingredient list.  Fill one fourth of your plate with lean protein foods.  Eat more foods that have fiber, like apples, carrots, beans, peas, and barley.  Eat more home-cooked foods. Eat less at restaurants and buffets.  Limit or avoid alcohol.  Limit foods high in starch and sugar.  Limit fried foods.  Cook foods without frying them. Baking, boiling, grilling, and broiling are all great options.  Lose weight if you are overweight. Losing even a small amount of weight can help your overall health. It can also help prevent diseases such as diabetes and heart disease. What foods can I eat? Grains  Whole grains, such as whole wheat or whole grain breads, crackers, cereals, and pasta. Unsweetened oatmeal, bulgur, barley, quinoa, or brown rice. Corn or whole wheat flour tortillas. Vegetables  Fresh or frozen vegetables (raw, steamed, roasted, or grilled). Green salads. Fruits  All fresh, canned (in natural juice), or frozen fruits. Meat and Other Protein Products  Ground beef (85% or leaner), grass-fed beef, or beef trimmed of fat. Skinless chicken or [place]. Ground chicken or [place]. Pork trimmed of fat. All fish and seafood. Eggs. Dried beans, peas, or lentils. Unsalted nuts or seeds. Unsalted canned or dry beans. Dairy  Low-fat dairy products, such as skim or 1% milk, 2% or reduced-fat cheeses, low-fat ricotta or cottage cheese, or plain low-fat yogurt. Fats and Oils  Tub margarines without trans fats. Light or reduced-fat mayonnaise and salad dressings. Avocado. Olive, canola, sesame, or safflower oils. Natural peanut  or almond butter (choose ones without added sugar and oil). The items listed above may not be a complete list of recommended foods or beverages. Contact your dietitian for more options.  What foods are not recommended? Grains  White bread. White pasta. White rice. Cornbread. Bagels, pastries, and croissants.  Crackers that contain trans fat. Vegetables  White potatoes. Corn. Creamed or fried vegetables. Vegetables in a cheese sauce. Fruits  Dried fruits. Canned fruit in light or heavy syrup. Fruit juice. Meat and Other Protein Products  Fatty cuts of meat. Ribs, chicken wings, bacon, sausage, bologna, salami, chitterlings, fatback, hot dogs, bratwurst, and packaged luncheon meats. Liver and organ meats. Dairy  Whole or 2% milk, cream, half-and-half, and cream cheese. Whole milk cheeses. Whole-fat or sweetened yogurt. Full-fat cheeses. Nondairy creamers and whipped toppings. Processed cheese, cheese spreads, or cheese curds. Sweets and Desserts  Corn syrup, sugars, honey, and molasses. Candy. Jam and jelly. Syrup. Sweetened cereals. Cookies, pies, cakes, donuts, muffins, and ice cream. Fats and Oils  Butter, stick margarine, lard, shortening, ghee, or bacon fat. Coconut, palm kernel, or palm oils. Beverages  Alcohol. Sweetened drinks (such as sodas, lemonade, and fruit drinks or punches). The items listed above may not be a complete list of foods and beverages to avoid. Contact your dietitian for more information.  This information is not intended to replace advice given to you by your health care provider. Make sure you discuss any questions you have with your health care provider. Document Released: 12/24/2011 Document Revised: 02/29/2016 Document Reviewed: 09/23/2013 Elsevier Interactive Patient Education  2017 [employer].

## 2016-10-23 LAB — CBC WITH DIFFERENTIAL/PLATELET
Basophils Absolute: 0 10*3/uL (ref 0.0–0.2)
Basos: 1 %
EOS (ABSOLUTE): 0.1 10*3/uL (ref 0.0–0.4)
Eos: 3 %
Hematocrit: 46.3 % (ref 37.5–51.0)
Hemoglobin: 15.3 g/dL (ref 13.0–17.7)
Immature Grans (Abs): 0 10*3/uL (ref 0.0–0.1)
Immature Granulocytes: 1 %
Lymphocytes Absolute: 1.6 10*3/uL (ref 0.7–3.1)
Lymphs: 39 %
MCH: 28.4 pg (ref 26.6–33.0)
MCHC: 33 g/dL (ref 31.5–35.7)
MCV: 86 fL (ref 79–97)
Monocytes Absolute: 0.4 10*3/uL (ref 0.1–0.9)
Monocytes: 9 %
Neutrophils Absolute: 2 10*3/uL (ref 1.4–7.0)
Neutrophils: 47 %
Platelets: 183 10*3/uL (ref 150–379)
RBC: 5.39 x10E6/uL (ref 4.14–5.80)
RDW: 15.1 % (ref 12.3–15.4)
WBC: 4.1 10*3/uL (ref 3.4–10.8)

## 2016-10-23 LAB — COMPREHENSIVE METABOLIC PANEL
ALT: 29 IU/L (ref 0–44)
AST: 26 IU/L (ref 0–40)
Albumin/Globulin Ratio: 1.9 (ref 1.2–2.2)
Albumin: 4.5 g/dL (ref 3.6–4.8)
Alkaline Phosphatase: 47 IU/L (ref 39–117)
BUN/Creatinine Ratio: 12 (ref 10–24)
BUN: 14 mg/dL (ref 8–27)
Bilirubin Total: 0.5 mg/dL (ref 0.0–1.2)
CO2: 23 mmol/L (ref 18–29)
Calcium: 8.9 mg/dL (ref 8.6–10.2)
Chloride: 105 mmol/L (ref 96–106)
Creatinine, Ser: 1.21 mg/dL (ref 0.76–1.27)
GFR calc Af Amer: 73 mL/min/{1.73_m2} (ref >59)
GFR calc non Af Amer: 63 mL/min/{1.73_m2} (ref >59)
Globulin, Total: 2.4 g/dL (ref 1.5–4.5)
Glucose: 101 mg/dL — ABNORMAL HIGH (ref 65–99)
Potassium: 4.3 mmol/L (ref 3.5–5.2)
Sodium: 143 mmol/L (ref 134–144)
Total Protein: 6.9 g/dL (ref 6.0–8.5)

## 2016-10-23 LAB — LIPID PANEL
Chol/HDL Ratio: 3.7 ratio (ref 0.0–5.0)
Cholesterol, Total: 154 mg/dL (ref 100–199)
HDL: 42 mg/dL (ref >39)
LDL Calculated: 103 mg/dL — ABNORMAL HIGH (ref 0–99)
Triglycerides: 44 mg/dL (ref 0–149)
VLDL Cholesterol Cal: 9 mg/dL (ref 5–40)

## 2016-10-23 LAB — HEMOGLOBIN A1C
Est. average glucose Bld gHb Est-mCnc: 117 mg/dL
Hgb A1c MFr Bld: 5.7 % — ABNORMAL HIGH (ref 4.8–5.6)

## 2016-10-23 LAB — HIV ANTIBODY (ROUTINE TESTING W REFLEX): HIV Screen 4th Generation wRfx: NONREACTIVE

## 2017-04-22 ENCOUNTER — Encounter: Payer: BLUE CROSS/BLUE SHIELD | Admitting: Family Medicine

## 2017-05-14 ENCOUNTER — Ambulatory Visit (INDEPENDENT_AMBULATORY_CARE_PROVIDER_SITE_OTHER): Payer: BLUE CROSS/BLUE SHIELD | Admitting: Family Medicine

## 2017-05-14 ENCOUNTER — Encounter: Payer: Self-pay | Admitting: Family Medicine

## 2017-05-14 ENCOUNTER — Other Ambulatory Visit: Payer: Self-pay

## 2017-05-14 VITALS — BP 116/62 | HR 73 | Temp 97.8°F | Resp 16 | Ht 66.14 in | Wt 159.0 lb

## 2017-05-14 DIAGNOSIS — J301 Allergic rhinitis due to pollen: Secondary | ICD-10-CM | POA: Diagnosis not present

## 2017-05-14 DIAGNOSIS — Z23 Encounter for immunization: Secondary | ICD-10-CM

## 2017-05-14 DIAGNOSIS — E7800 Pure hypercholesterolemia, unspecified: Secondary | ICD-10-CM

## 2017-05-14 DIAGNOSIS — N289 Disorder of kidney and ureter, unspecified: Secondary | ICD-10-CM

## 2017-05-14 DIAGNOSIS — R7302 Impaired glucose tolerance (oral): Secondary | ICD-10-CM | POA: Diagnosis not present

## 2017-05-14 DIAGNOSIS — Z125 Encounter for screening for malignant neoplasm of prostate: Secondary | ICD-10-CM | POA: Diagnosis not present

## 2017-05-14 DIAGNOSIS — Z0000 Encounter for general adult medical examination without abnormal findings: Secondary | ICD-10-CM

## 2017-05-14 DIAGNOSIS — Z8249 Family history of ischemic heart disease and other diseases of the circulatory system: Secondary | ICD-10-CM

## 2017-05-14 LAB — POCT URINALYSIS DIP (MANUAL ENTRY)
Bilirubin, UA: NEGATIVE
Blood, UA: NEGATIVE
Glucose, UA: NEGATIVE mg/dL
Ketones, POC UA: NEGATIVE mg/dL
Leukocytes, UA: NEGATIVE
Nitrite, UA: NEGATIVE
Protein Ur, POC: NEGATIVE mg/dL
Spec Grav, UA: 1.02 (ref 1.010–1.025)
Urobilinogen, UA: 0.2 E.U./dL
pH, UA: 5.5 (ref 5.0–8.0)

## 2017-05-14 MED ORDER — MELOXICAM 15 MG PO TABS: 15.0000 mg | ORAL_TABLET | Freq: Every day | ORAL | 1 refills | Status: DC

## 2017-05-14 MED ORDER — ZOSTER VAC RECOMB ADJUVANTED 50 MCG/0.5ML IM SUSR: 0.5000 mL | Freq: Once | INTRAMUSCULAR | 1 refills | Status: DC

## 2017-05-14 MED ORDER — ATORVASTATIN CALCIUM 10 MG PO TABS: 10.0000 mg | ORAL_TABLET | Freq: Every day | ORAL | 1 refills | Status: DC

## 2017-05-14 MED ORDER — AZELASTINE HCL 0.1 % NA SOLN: 2.0000 | Freq: Two times a day (BID) | NASAL | 5 refills | Status: DC

## 2017-05-14 MED ORDER — HYDROCORTISONE 2.5 % RE CREA: TOPICAL_CREAM | RECTAL | 3 refills | Status: DC

## 2017-05-14 NOTE — Progress Notes (Signed)
Subjective:    Patient ID: Earl Thomas, male    DOB: [birth year], 66 y.o.   MRN: [id number]  05/14/2017  Annual Exam    HPI This 66 y.o. male presents for Complete Physical Examination.  Last physical:  01-11-2016 Colonoscopy:  03-2015 PSA: Eye exam:  10/2016 Dental exam:  Every six months.   Visual Acuity Screening   Right eye Left eye Both eyes  Without correction:     With correction: _0    BP Readings from Last 3 Encounters:  05/14/17 116/62  10/22/16 112/71  04/23/16 116/82   Wt Readings from Last 3 Encounters:  05/14/17 159 lb (72.1 kg)  10/22/16 156 lb 9.6 oz (71 kg)  04/23/16 158 lb (71.7 kg)   Immunization History  Administered Date(s) Administered  . Influenza,inj,Quad PF,6+ Mos 05/30/2015, 04/23/2016  . Influenza-Unspecified 04/07/2014  . Tdap 02/13/2009  . Zoster 04/12/2014    Review of Systems  Constitutional: Negative for activity change, appetite change, chills, diaphoresis, fatigue, fever and unexpected weight change.  HENT: Positive for sneezing. Negative for congestion, dental problem, drooling, ear discharge, ear pain, facial swelling, hearing loss, mouth sores, nosebleeds, postnasal drip, rhinorrhea, sinus pressure, sore throat, tinnitus, trouble swallowing and voice change.   Eyes: Negative for photophobia, pain, discharge, redness, itching and visual disturbance.  Respiratory: Negative for apnea, cough, choking, chest tightness, shortness of breath, wheezing and stridor.   Cardiovascular: Negative for chest pain, palpitations and leg swelling.  Gastrointestinal: Negative for abdominal pain, blood in stool, constipation, diarrhea, nausea and vomiting.  Endocrine: Negative for cold intolerance, heat intolerance, polydipsia, polyphagia and polyuria.  Genitourinary: Negative for decreased urine volume, difficulty urinating, discharge, dysuria, enuresis, flank pain, frequency, genital sores, hematuria, penile pain, penile swelling,  scrotal swelling, testicular pain and urgency.       Nocturia x 0-1.  Urinary stream is strong.   Musculoskeletal: Negative for arthralgias, back pain, gait problem, joint swelling, myalgias, neck pain and neck stiffness.  Skin: Negative for color change, pallor, rash and wound.  Allergic/Immunologic: Positive for environmental allergies. Negative for food allergies and immunocompromised state.  Neurological: Negative for dizziness, tremors, seizures, syncope, facial asymmetry, speech difficulty, weakness, light-headedness, numbness and headaches.  Hematological: Negative for adenopathy. Does not bruise/bleed easily.  Psychiatric/Behavioral: Negative for agitation, behavioral problems, confusion, decreased concentration, dysphoric mood, hallucinations, self-injury, sleep disturbance and suicidal ideas. The patient is not nervous/anxious and is not hyperactive.        12:00; wakes up 600.    Past Medical History:  Diagnosis Date  . Arthritis   . Glucose intolerance (impaired glucose tolerance)   . Hyperlipidemia   . 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   Past Surgical History:  Procedure Laterality Date  . ROTATOR CUFF REPAIR Right 01/02/16   No Known Allergies Current Outpatient Medications on File Prior to Visit  Medication Sig Dispense Refill  . aspirin 325 MG tablet Take 325 mg by mouth daily.     No current facility-administered medications on file prior to visit.    Social History   Socioeconomic History  . Marital status: Married    Spouse name: Not on file  . Number of children: Not on file  . Years of education: Not on file  . Highest education level: Not on file  Social Needs  . Financial resource strain: Not on file  . Food insecurity - worry: Not on file  . Food insecurity - inability: Not on file  . Transportation  needs - medical: Not on file  . Transportation needs - non-medical: Not on file  Occupational History  . Occupation:  Professor  Tobacco Use  . Smoking status: Never Smoker  . Smokeless tobacco: Never Used  Substance and Sexual Activity  . Alcohol use: No  . Drug use: No  . Sexual activity: Yes  Other Topics Concern  . 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      Tobacco: pipe in 1980s      Alcohol: none      Exercise: joined [employer]; going 2-3 times per week.        Seatbelt:  100%   Family History  Problem Relation Age of Onset  . Cancer Father 17       prostate cancer  . Diabetes Father   . Heart disease Father 57       AMI late 72s  . Cancer Mother 58       Breast cancer  . Heart disease Mother        CABG at age 16  . Diabetes Brother   . Heart disease Brother        AMI x 2; CABG  . Hyperlipidemia Brother        Objective:    BP 116/62   Pulse 73   Temp 97.8 F (36.6 C) (Oral)   Resp 16   Ht 5' 6.14" (1.68 m)   Wt 159 lb (72.1 kg)   SpO2 96%   BMI 25.55 kg/m  Physical Exam  Constitutional: He is oriented to person, place, and time. He appears well-developed and well-nourished. No distress.  HENT:  Head: Normocephalic and atraumatic.  Right Ear: External ear normal.  Left Ear: External ear normal.  Nose: Nose normal.  Mouth/Throat: Oropharynx is clear and moist.  Eyes: Conjunctivae and EOM are normal. Pupils are equal, round, and reactive to light.  Neck: Normal range of motion. Neck supple. Carotid bruit is not present. No thyromegaly present.  Cardiovascular: Normal rate, regular rhythm, normal heart sounds and intact distal pulses. Exam reveals no gallop and no friction rub.  No murmur heard. Pulmonary/Chest: Effort normal and breath sounds normal. He has no wheezes. He has no  rales.  Abdominal: Soft. Bowel sounds are normal. He exhibits no distension and no mass. There is no tenderness. There is no rebound and no guarding. Hernia confirmed negative in the right inguinal area and confirmed negative in the left inguinal area.  Genitourinary: Rectum normal, prostate normal, testes normal and penis normal. Right testis shows no mass and no tenderness. Left testis shows no mass and no tenderness.  Musculoskeletal:       Right shoulder: Normal.       Left shoulder: Normal.       Cervical back: Normal.  Lymphadenopathy:    He has no cervical adenopathy.       Right: No inguinal adenopathy present.       Left: No inguinal adenopathy present.  Neurological: He is alert and oriented to person, place, and time. He has normal reflexes. No cranial nerve deficit. He exhibits normal muscle tone. Coordination normal.  Skin: Skin is warm and dry. No rash  noted. He is not diaphoretic.  Psychiatric: He has a normal mood and affect. His behavior is normal. Judgment and thought content normal.   No results found. Depression screen [facility] 2/9 05/14/2017 10/22/2016 04/23/2016 01/11/2016 11/06/2015  Decreased Interest 0 0 0 0 0  Down, Depressed, Hopeless 0 0 0 0 0  PHQ - 2 Score 0 0 0 0 0   Fall Risk  05/14/2017 10/22/2016 04/23/2016 01/11/2016 11/06/2015  Falls in the past year? _0         Assessment & Plan:   1. Routine physical examination   2. Glucose intolerance (impaired glucose tolerance)   3. Pure hypercholesterolemia   4. Renal insufficiency   5. Seasonal allergic rhinitis due to pollen   6. Need for Tdap vaccination   7. Need for prophylactic vaccination and inoculation against influenza   8. Screening for prostate cancer   9. Need for shingles vaccine   10. Family history of cardiovascular disease    -anticipatory guidance provided --- exercise, weight loss, safe driving practices, aspirin 73m daily. -obtain age appropriate screening labs and labs for chronic  disease management. -refills provided for chronic disease management; no adjustments made at this time.    Orders Placed This Encounter  Procedures  . Flu Vaccine QUAD 36+ mos IM  . Tdap vaccine greater than or equal to 7yo IM  . CBC with Differential/Platelet  . Comprehensive metabolic panel  . Hemoglobin A1c  . TSH  . PSA  . Lipid panel  . POCT urinalysis dipstick  . EKG 12-Lead   Meds ordered this encounter  Medications  . DISCONTD: Zoster Vaccine Adjuvanted ([facility] injection    Sig: Inject 0.5 mLs once for 1 dose into the muscle.    Dispense:  0.5 mL    Refill:  1  . atorvastatin (LIPITOR) 10 MG tablet    Sig: Take 1 tablet (10 mg total) daily at 6 PM by mouth.    Dispense:  90 tablet    Refill:  1  . azelastine (ASTELIN) 0.1 % nasal spray    Sig: Place 2 sprays 2 (two) times daily into both nostrils. Use in each nostril as directed    Dispense:  30 mL    Refill:  5  . meloxicam (MOBIC) 15 MG tablet    Sig: Take 1 tablet (15 mg total) daily by mouth.    Dispense:  30 tablet    Refill:  1  . hydrocortisone (PROCTOSOL HC) 2.5 % rectal cream    Sig: APPLY ONE APPLICATORFUL RECTALLY TWICE DAILY as needed for hemorrhoid pain/itching/bleeding    Dispense:  29 g    Refill:  3    Return in about 6 months (around 11/11/2017) for follow-up chronic medical conditions.   Zidane Renner MElayne Guerin M.D. [facility]  [phone] [phone]

## 2017-05-14 NOTE — Patient Instructions (Addendum)
   IF you received an x-ray today, you will receive an invoice from [facility]. Please contact [facility] at [phone] with questions or concerns regarding your invoice.   IF you received labwork today, you will receive an invoice from [facility]. Please contact [facility] at [phone] with questions or concerns regarding your invoice.   Our billing staff will not be able to assist you with questions regarding bills from these companies.  You will be contacted with the lab results as soon as they are available. The fastest way to get your results is to activate your My Chart account. Instructions are located on the last page of this paperwork. If you have not heard from us regarding the results in 2 weeks, please contact this office.      Preventive Care 40-64 Years, Male Preventive care refers to lifestyle choices and visits with your health care provider that can promote health and wellness. What does preventive care include?  A yearly physical exam. This is also called an annual well check.  Dental exams once or twice a year.  Routine eye exams. Ask your health care provider how often you should have your eyes checked.  Personal lifestyle choices, including: ? Daily care of your teeth and gums. ? Regular physical activity. ? Eating a healthy diet. ? Avoiding tobacco and drug use. ? Limiting alcohol use. ? Practicing safe sex. ? Taking low-dose aspirin every day starting at age 50. What happens during an annual well check? The services and screenings done by your health care provider during your annual well check will depend on your age, overall health, lifestyle risk factors, and family history of disease. Counseling Your health care provider may ask you questions about your:  Alcohol use.  Tobacco use.  Drug use.  Emotional well-being.  Home and relationship well-being.  Sexual activity.  Eating habits.  Work and work  environment.  Screening You may have the following tests or measurements:  Height, weight, and BMI.  Blood pressure.  Lipid and cholesterol levels. These may be checked every 5 years, or more frequently if you are over 50 years old.  Skin check.  Lung cancer screening. You may have this screening every year starting at age 55 if you have a 30-pack-year history of smoking and currently smoke or have quit within the past 15 years.  Fecal occult blood test (FOBT) of the stool. You may have this test every year starting at age 50.  Flexible sigmoidoscopy or colonoscopy. You may have a sigmoidoscopy every 5 years or a colonoscopy every 10 years starting at age 50.  Prostate cancer screening. Recommendations will vary depending on your family history and other risks.  Hepatitis C blood test.  Hepatitis B blood test.  Sexually transmitted disease (STD) testing.  Diabetes screening. This is done by checking your blood sugar (glucose) after you have not eaten for a while (fasting). You may have this done every 1-3 years.  Discuss your test results, treatment options, and if necessary, the need for more tests with your health care provider. Vaccines Your health care provider may recommend certain vaccines, such as:  Influenza vaccine. This is recommended every year.  Tetanus, diphtheria, and acellular pertussis (Tdap, Td) vaccine. You may need a Td booster every 10 years.  Varicella vaccine. You may need this if you have not been vaccinated.  Zoster vaccine. You may need this after age 60.  Measles, mumps, and rubella (MMR) vaccine. You may need at least one dose   of MMR if you were born in 1957 or later. You may also need a second dose.  Pneumococcal 13-valent conjugate (PCV13) vaccine. You may need this if you have certain conditions and have not been vaccinated.  Pneumococcal polysaccharide (PPSV23) vaccine. You may need one or two doses if you smoke cigarettes or if you have  certain conditions.  Meningococcal vaccine. You may need this if you have certain conditions.  Hepatitis A vaccine. You may need this if you have certain conditions or if you travel or work in places where you may be exposed to hepatitis A.  Hepatitis B vaccine. You may need this if you have certain conditions or if you travel or work in places where you may be exposed to hepatitis B.  Haemophilus influenzae type b (Hib) vaccine. You may need this if you have certain risk factors.  Talk to your health care provider about which screenings and vaccines you need and how often you need them. This information is not intended to replace advice given to you by your health care provider. Make sure you discuss any questions you have with your health care provider. Document Released: 07/21/2015 Document Revised: 03/13/2016 Document Reviewed: 04/25/2015 Elsevier Interactive Patient Education  2017 Elsevier Inc.  

## 2017-05-15 LAB — CBC WITH DIFFERENTIAL/PLATELET
Basophils Absolute: 0.1 10*3/uL (ref 0.0–0.2)
Basos: 1 %
EOS (ABSOLUTE): 0.2 10*3/uL (ref 0.0–0.4)
Eos: 5 %
Hematocrit: 46 % (ref 37.5–51.0)
Hemoglobin: 15.1 g/dL (ref 13.0–17.7)
Immature Grans (Abs): 0 10*3/uL (ref 0.0–0.1)
Immature Granulocytes: 0 %
Lymphocytes Absolute: 1.4 10*3/uL (ref 0.7–3.1)
Lymphs: 37 %
MCH: 28.3 pg (ref 26.6–33.0)
MCHC: 32.8 g/dL (ref 31.5–35.7)
MCV: 86 fL (ref 79–97)
Monocytes Absolute: 0.4 10*3/uL (ref 0.1–0.9)
Monocytes: 11 %
Neutrophils Absolute: 1.8 10*3/uL (ref 1.4–7.0)
Neutrophils: 46 %
Platelets: 176 10*3/uL (ref 150–379)
RBC: 5.34 x10E6/uL (ref 4.14–5.80)
RDW: 13.9 % (ref 12.3–15.4)
WBC: 4 10*3/uL (ref 3.4–10.8)

## 2017-05-15 LAB — LIPID PANEL
Chol/HDL Ratio: 4.3 ratio (ref 0.0–5.0)
Cholesterol, Total: 160 mg/dL (ref 100–199)
HDL: 37 mg/dL — ABNORMAL LOW (ref >39)
LDL Calculated: 110 mg/dL — ABNORMAL HIGH (ref 0–99)
Triglycerides: 66 mg/dL (ref 0–149)
VLDL Cholesterol Cal: 13 mg/dL (ref 5–40)

## 2017-05-15 LAB — COMPREHENSIVE METABOLIC PANEL
ALT: 20 IU/L (ref 0–44)
AST: 21 IU/L (ref 0–40)
Albumin/Globulin Ratio: 2 (ref 1.2–2.2)
Albumin: 4.6 g/dL (ref 3.6–4.8)
Alkaline Phosphatase: 46 IU/L (ref 39–117)
BUN/Creatinine Ratio: 9 — ABNORMAL LOW (ref 10–24)
BUN: 12 mg/dL (ref 8–27)
Bilirubin Total: 0.6 mg/dL (ref 0.0–1.2)
CO2: 25 mmol/L (ref 20–29)
Calcium: 9.1 mg/dL (ref 8.6–10.2)
Chloride: 105 mmol/L (ref 96–106)
Creatinine, Ser: 1.37 mg/dL — ABNORMAL HIGH (ref 0.76–1.27)
GFR calc Af Amer: 63 mL/min/{1.73_m2} (ref >59)
GFR calc non Af Amer: 54 mL/min/{1.73_m2} — ABNORMAL LOW (ref >59)
Globulin, Total: 2.3 g/dL (ref 1.5–4.5)
Glucose: 98 mg/dL (ref 65–99)
Potassium: 4.2 mmol/L (ref 3.5–5.2)
Sodium: 142 mmol/L (ref 134–144)
Total Protein: 6.9 g/dL (ref 6.0–8.5)

## 2017-05-15 LAB — PSA: Prostate Specific Ag, Serum: 0.6 ng/mL (ref 0.0–4.0)

## 2017-05-15 LAB — TSH: TSH: 2.23 u[IU]/mL (ref 0.450–4.500)

## 2017-05-15 LAB — HEMOGLOBIN A1C
Est. average glucose Bld gHb Est-mCnc: 126 mg/dL
Hgb A1c MFr Bld: 6 % — ABNORMAL HIGH (ref 4.8–5.6)

## 2017-11-12 ENCOUNTER — Other Ambulatory Visit: Payer: Self-pay

## 2017-11-12 ENCOUNTER — Ambulatory Visit (INDEPENDENT_AMBULATORY_CARE_PROVIDER_SITE_OTHER): Payer: BLUE CROSS/BLUE SHIELD

## 2017-11-12 ENCOUNTER — Ambulatory Visit: Payer: BLUE CROSS/BLUE SHIELD | Admitting: Family Medicine

## 2017-11-12 ENCOUNTER — Encounter: Payer: Self-pay | Admitting: Family Medicine

## 2017-11-12 VITALS — BP 116/72 | HR 83 | Temp 98.0°F | Resp 16 | Ht 66.14 in | Wt 158.0 lb

## 2017-11-12 DIAGNOSIS — E7800 Pure hypercholesterolemia, unspecified: Secondary | ICD-10-CM | POA: Diagnosis not present

## 2017-11-12 DIAGNOSIS — Z8249 Family history of ischemic heart disease and other diseases of the circulatory system: Secondary | ICD-10-CM

## 2017-11-12 DIAGNOSIS — R7302 Impaired glucose tolerance (oral): Secondary | ICD-10-CM

## 2017-11-12 DIAGNOSIS — M25512 Pain in left shoulder: Secondary | ICD-10-CM

## 2017-11-12 DIAGNOSIS — K644 Residual hemorrhoidal skin tags: Secondary | ICD-10-CM | POA: Diagnosis not present

## 2017-11-12 DIAGNOSIS — N289 Disorder of kidney and ureter, unspecified: Secondary | ICD-10-CM | POA: Diagnosis not present

## 2017-11-12 DIAGNOSIS — J301 Allergic rhinitis due to pollen: Secondary | ICD-10-CM | POA: Diagnosis not present

## 2017-11-12 LAB — CBC WITH DIFFERENTIAL/PLATELET
Basophils Absolute: 0 10*3/uL (ref 0.0–0.2)
Basos: 1 %
EOS (ABSOLUTE): 0.2 10*3/uL (ref 0.0–0.4)
Eos: 4 %
Hematocrit: 45.3 % (ref 37.5–51.0)
Hemoglobin: 14.8 g/dL (ref 13.0–17.7)
Immature Grans (Abs): 0 10*3/uL (ref 0.0–0.1)
Immature Granulocytes: 0 %
Lymphocytes Absolute: 1.7 10*3/uL (ref 0.7–3.1)
Lymphs: 34 %
MCH: 28 pg (ref 26.6–33.0)
MCHC: 32.7 g/dL (ref 31.5–35.7)
MCV: 86 fL (ref 79–97)
Monocytes Absolute: 0.5 10*3/uL (ref 0.1–0.9)
Monocytes: 11 %
Neutrophils Absolute: 2.4 10*3/uL (ref 1.4–7.0)
Neutrophils: 50 %
Platelets: 197 10*3/uL (ref 150–379)
RBC: 5.28 x10E6/uL (ref 4.14–5.80)
RDW: 14.5 % (ref 12.3–15.4)
WBC: 4.8 10*3/uL (ref 3.4–10.8)

## 2017-11-12 LAB — COMPREHENSIVE METABOLIC PANEL
ALT: 30 IU/L (ref 0–44)
AST: 22 IU/L (ref 0–40)
Albumin/Globulin Ratio: 1.6 (ref 1.2–2.2)
Albumin: 4.4 g/dL (ref 3.6–4.8)
Alkaline Phosphatase: 52 IU/L (ref 39–117)
BUN/Creatinine Ratio: 8 — ABNORMAL LOW (ref 10–24)
BUN: 10 mg/dL (ref 8–27)
Bilirubin Total: 0.6 mg/dL (ref 0.0–1.2)
CO2: 23 mmol/L (ref 20–29)
Calcium: 9.3 mg/dL (ref 8.6–10.2)
Chloride: 103 mmol/L (ref 96–106)
Creatinine, Ser: 1.24 mg/dL (ref 0.76–1.27)
GFR calc Af Amer: 71 mL/min/{1.73_m2} (ref >59)
GFR calc non Af Amer: 61 mL/min/{1.73_m2} (ref >59)
Globulin, Total: 2.7 g/dL (ref 1.5–4.5)
Glucose: 111 mg/dL — ABNORMAL HIGH (ref 65–99)
Potassium: 4.4 mmol/L (ref 3.5–5.2)
Sodium: 140 mmol/L (ref 134–144)
Total Protein: 7.1 g/dL (ref 6.0–8.5)

## 2017-11-12 LAB — LIPID PANEL
Chol/HDL Ratio: 4.3 ratio (ref 0.0–5.0)
Cholesterol, Total: 165 mg/dL (ref 100–199)
HDL: 38 mg/dL — ABNORMAL LOW (ref >39)
LDL Calculated: 112 mg/dL — ABNORMAL HIGH (ref 0–99)
Triglycerides: 74 mg/dL (ref 0–149)
VLDL Cholesterol Cal: 15 mg/dL (ref 5–40)

## 2017-11-12 LAB — GLUCOSE, POCT (MANUAL RESULT ENTRY): POC Glucose: 103 mg/dl — AB (ref 70–99)

## 2017-11-12 LAB — POCT GLYCOSYLATED HEMOGLOBIN (HGB A1C): Hemoglobin A1C: 5.9

## 2017-11-12 MED ORDER — ATORVASTATIN CALCIUM 10 MG PO TABS: 10.0000 mg | ORAL_TABLET | Freq: Every day | ORAL | 1 refills | Status: DC

## 2017-11-12 MED ORDER — HYDROCORTISONE 2.5 % RE CREA: TOPICAL_CREAM | RECTAL | 3 refills | Status: DC

## 2017-11-12 MED ORDER — MELOXICAM 15 MG PO TABS: 15.0000 mg | ORAL_TABLET | Freq: Every day | ORAL | 1 refills | Status: DC

## 2017-11-12 MED ORDER — AZELASTINE HCL 0.1 % NA SOLN: 2.0000 | Freq: Two times a day (BID) | NASAL | 5 refills | Status: DC

## 2017-11-12 NOTE — Patient Instructions (Addendum)
IF you received an x-ray today, you will receive an invoice from [facility]. Please contact [facility] at [phone] with questions or concerns regarding your invoice.   IF you received labwork today, you will receive an invoice from [facility]. Please contact [facility] at [phone] with questions or concerns regarding your invoice.   Our billing staff will not be able to assist you with questions regarding bills from these companies.  You will be contacted with the lab results as soon as they are available. The fastest way to get your results is to activate your My Chart account. Instructions are located on the last page of this paperwork. If you have not heard from [place] regarding the results in 2 weeks, please contact this office.      Subscapularis Tendon Injury Rehab Ask your health care provider which exercises are safe for you. Do exercises exactly as told by your health care provider and adjust them as directed. It is normal to feel mild stretching, pulling, tightness, or discomfort as you do these exercises, but you should stop right away if you feel sudden pain or your pain gets worse.Do not begin these exercises until told by your health care provider. Stretching and range of motion exercises These exercises warm up your muscles and joints and improve the movement and flexibility of your shoulder. These exercises also help to relieve pain, numbness, and tingling. Exercise A: Pendulum  1. Stand near a wall or a surface that you can hold onto for balance. 2. Bend at the waist and let your left / right arm hang straight down. Use your other arm to keep your balance. 3. Relax your arm and shoulder muscles, and move your hips and your trunk so your left / right arm swings freely. Your arm should swing because of the motion of your body, not because you are using your arm or shoulder muscles. 4. Keep moving so your arm swings in the following directions, as told  by your health care provider: ? Side to side. ? Forward and backward. ? In clockwise and counterclockwise circles. Repeat __________ times, or for __________ seconds per direction. Complete this exercise __________ times a day. Exercise B: Flexion, seated  1. Sit in a stable chair so your left / right forearm can rest on a flat surface. Your elbow should rest at a height that keeps your upper arm next to your body. 2. Keeping your left / right shoulder relaxed, lean forward at the waist and slide your hand forward until you feel a stretch in your shoulder. You can move your chair farther back to increase the stretch, if needed. 3. Hold for __________ seconds. 4. Slowly return to the starting position. Repeat __________ times. Complete this exercise __________ times a day. Strengthening exercises These exercises build strength and endurance in your shoulder. Endurance is the ability to use your muscles for a long time, even after they get tired. Exercise C: Shoulder extension, prone  1. Lie on your abdomen on a firm surface so your left / right arm hangs over the edge. 2. Hold a __________ weight in your left / right hand so your palm faces in toward your body. Your arm should be straight. 3. Squeeze your shoulder blade down toward the middle of your back. 4. Slowly raise your arm behind you, up to the height of the surface that you are lying on. Keep your arm straight. 5. Hold for __________ seconds. 6. Slowly return to the starting position  and relax your muscles. Repeat __________ times. Complete this exercise __________ times a day. Exercise D: Internal rotation, isometric  1. Stand or sit in a doorway, facing the door frame. 2. Bend your left / right elbow and place the inside of your wrist against the door frame. Only your wrist should be touching the frame. Keep your upper arm at your side. 3. Gently press your wrist against the door frame, as if you are trying to push your arm  toward your abdomen. 4. Avoid shrugging your shoulder while you press your wrist into the door frame. Keep your shoulder blade tucked down toward the middle of your back. 5. Hold for __________ seconds. 6. Slowly release the tension, and relax your muscles completely before you repeat the exercise. Repeat __________ times. Complete this exercise __________ times a day. Exercise E: External rotation 1. Lie down on your left / right side. 2. Place a small pillow or rolled-up towel under your left / right upper arm. 3. Bend your left / right elbow to an "L" shape (90 degrees) so your hand is palm-down on your abdomen. 4. Squeeze your shoulder blade back toward your spine. 5. Keeping your upper arm against the pillow or towel: ? Move (pivot) your forearm and your hand away from your abdomen and toward the ceiling. ? Keep your elbow bent at 90 degrees. 6. Hold for __________ seconds. 7. Slowly return to the starting position. Repeat __________ times. Complete this exercise __________ times a day. Exercise F: Scapular retraction  1. Sit in a stable chair without arm rests, or stand. 2. Secure an exercise band to a stable object in front of you so the band is at shoulder height. 3. Hold one end of the exercise band in each hand. 4. Squeeze your shoulder blades together and move your elbows slightly behind you. Do not shrug your shoulders while you do this. 5. Hold for __________ seconds. 6. Slowly return to the starting position. Repeat __________ times. Complete this exercise __________ times a day. This information is not intended to replace advice given to you by your health care provider. Make sure you discuss any questions you have with your health care provider. Document Released: 06/24/2005 Document Revised: 02/29/2016 Document Reviewed: 05/28/2015 Elsevier Interactive Patient Education  [employer].

## 2017-11-12 NOTE — Progress Notes (Signed)
Subjective:    Patient ID: Earl Thomas, male    DOB: October 28, 1950, 67 y.o.   MRN: [id number]  11/12/2017  Chronic Conditions (6 month follow-up )    HPI This 67 y.o. male presents for six month follow-up of hypertension, hypercholesterolemia, glucose intolerance.  Patient reports good compliance with medication, good tolerance to medication, and good symptom control.     L shoulder pain: onset with excessive carrying or overuse.  Has heavy bag every day.  Must lift for classes; lifting. Taking Meloxicam.  No previous xray.   BP Readings from Last 3 Encounters:  11/12/17 116/72  05/14/17 116/62  10/22/16 112/71   Wt Readings from Last 3 Encounters:  11/12/17 158 lb (71.7 kg)  05/14/17 159 lb (72.1 kg)  10/22/16 156 lb 9.6 oz (71 kg)   Immunization History  Administered Date(s) Administered  . Influenza,inj,Quad PF,6+ Mos 05/30/2015, 04/23/2016, 05/14/2017  . Influenza-Unspecified 04/07/2014  . Tdap 02/13/2009, 05/14/2017  . Zoster 04/12/2014    Review of Systems  Constitutional: Negative for activity change, appetite change, chills, diaphoresis, fatigue, fever and unexpected weight change.  HENT: Negative for congestion, dental problem, drooling, ear discharge, ear pain, facial swelling, hearing loss, mouth sores, nosebleeds, postnasal drip, rhinorrhea, sinus pressure, sneezing, sore throat, tinnitus, trouble swallowing and voice change.   Eyes: Negative for photophobia, pain, discharge, redness, itching and visual disturbance.  Respiratory: Negative for apnea, cough, choking, chest tightness, shortness of breath, wheezing and stridor.   Cardiovascular: Negative for chest pain, palpitations and leg swelling.  Gastrointestinal: Negative for abdominal pain, blood in stool, constipation, diarrhea, nausea and vomiting.  Endocrine: Negative for cold intolerance, heat intolerance, polydipsia, polyphagia and polyuria.  Genitourinary: Negative for decreased urine volume, difficulty  urinating, discharge, dysuria, enuresis, flank pain, frequency, genital sores, hematuria, penile pain, penile swelling, scrotal swelling, testicular pain and urgency.  Musculoskeletal: Positive for arthralgias. Negative for back pain, gait problem, joint swelling, myalgias, neck pain and neck stiffness.  Skin: Negative for color change, pallor, rash and wound.  Allergic/Immunologic: Negative for environmental allergies, food allergies and immunocompromised state.  Neurological: Negative for dizziness, tremors, seizures, syncope, facial asymmetry, speech difficulty, weakness, light-headedness, numbness and headaches.  Hematological: Negative for adenopathy. Does not bruise/bleed easily.  Psychiatric/Behavioral: Negative for agitation, behavioral problems, confusion, decreased concentration, dysphoric mood, hallucinations, self-injury, sleep disturbance and suicidal ideas. The patient is not nervous/anxious and is not hyperactive.     Past Medical History:  Diagnosis Date  . Arthritis   . Glucose intolerance (impaired glucose tolerance)   . Hyperlipidemia   . 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   Past Surgical History:  Procedure Laterality Date  . ROTATOR CUFF REPAIR Right 01/02/16   No Known Allergies Current Outpatient Medications on File Prior to Visit  Medication Sig Dispense Refill  . aspirin 325 MG tablet Take 325 mg by mouth daily.     No current facility-administered medications on file prior to visit.    Social History   Socioeconomic History  . Marital status: Married    Spouse name: Not on file  . Number of children: Not on file  . Years of education: Not on file  . Highest education level: Not on file  Occupational History  . Occupation: Professor  Social Needs  . Financial resource strain: Not on file  . Food insecurity:    Worry: Not on file    Inability: Not on file  . Transportation needs:    Medical: Not on file  Non-medical: Not on file  Tobacco Use  . Smoking status: Never Smoker  . Smokeless tobacco: Never Used  Substance and Sexual Activity  . Alcohol use: No  . Drug use: No  . Sexual activity: Yes  Lifestyle  . Physical activity:    Days per week: Not on file    Minutes per session: Not on file  . Stress: Not on file  Relationships  . Social connections:    Talks on phone: Not on file    Gets together: Not on file    Attends religious service: Not on file    Active member of club or organization: Not on file    Attends meetings of clubs or organizations: Not on file    Relationship status: Not on file  . Intimate partner violence:    Fear of current or ex partner: Not on file    Emotionally abused: Not on file    Physically abused: Not on file    Forced sexual activity: Not on file  Other Topics Concern  . 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      Tobacco: pipe in 1980s      Alcohol: none      Exercise: joined [employer]; going 2-3 times per week.        Seatbelt:  100%   Family History  Problem Relation Age of Onset  . Cancer Father 33       prostate cancer  . Diabetes Father   . Heart disease Father 70       AMI late 49s  . Cancer Mother 88       Breast cancer  . Heart disease Mother        CABG at age 43  . Diabetes Brother   . Heart disease Brother        AMI x 2; CABG  . Hyperlipidemia Brother        Objective:    BP 116/72   Pulse 83   Temp 98 F (36.7 C) (Oral)   Resp 16   Ht 5' 6.14" (1.68 m)   Wt 158 lb (71.7 kg)   SpO2 98%   BMI 25.39 kg/m  Physical Exam  Constitutional: He is oriented to person, place, and time. He appears well-developed and well-nourished.  No distress.  HENT:  Head: Normocephalic and atraumatic.  Right Ear: External ear normal.  Left Ear: External ear normal.  Nose: Nose normal.  Mouth/Throat: Oropharynx is clear and moist.  Eyes: Pupils are equal, round, and reactive to light. Conjunctivae and EOM are normal.  Neck: Normal range of motion. Neck supple. Carotid bruit is not present. No thyromegaly present.  Cardiovascular: Normal rate, regular rhythm, normal heart sounds and intact distal pulses. Exam reveals no gallop and no friction rub.  No murmur heard. Pulmonary/Chest: Effort normal and breath sounds normal. He has no wheezes. He has no rales.  Abdominal: Soft. Bowel sounds are normal. He exhibits no distension and no mass. There is no tenderness. There is no rebound and no guarding.  Musculoskeletal:       Right shoulder: Normal.       Left shoulder: He exhibits pain. He  exhibits normal range of motion, no tenderness, no bony tenderness, no spasm, normal pulse and normal strength.       Cervical back: Normal. He exhibits normal range of motion, no tenderness, no bony tenderness, no swelling, no edema, no pain, no spasm and normal pulse.  Lymphadenopathy:    He has no cervical adenopathy.  Neurological: He is alert and oriented to person, place, and time. He has normal reflexes. No cranial nerve deficit. He exhibits normal muscle tone. Coordination normal.  Skin: Skin is warm and dry. No rash noted. He is not diaphoretic.  Psychiatric: He has a normal mood and affect. His behavior is normal. Judgment and thought content normal.  Nursing note and vitals reviewed.  No results found. Depression screen [facility] 2/9 11/12/2017 05/14/2017 10/22/2016 04/23/2016 01/11/2016  Decreased Interest 0 0 0 0 0  Down, Depressed, Hopeless 0 0 0 0 0  PHQ - 2 Score 0 0 0 0 0   Fall Risk  11/12/2017 05/14/2017 10/22/2016 04/23/2016 01/11/2016  Falls in the past year? _0         Assessment & Plan:   1. Pure hypercholesterolemia   2.  Glucose intolerance (impaired glucose tolerance)   3. Seasonal allergic rhinitis due to pollen   4. Acute pain of left shoulder   5. Renal insufficiency   6. Family history of early CAD   59. External hemorrhoid     L shoulder pain:  New; obtain L shoulder films; rx for Meloxicam; home exercise program provided; consistent with sprain due to overuse.  If no improvement in one month, call for ortho referral.  Hypercholesterolemia and glucose intolerance: stable; obtain labs; continue current medications.  Chronic renal insufficiency: stable; monitor closely at each visit and avoid nephrotoxic OTC medications.  External hemorrhoid: chronic; refill of topical steroid provided.  Allergic rhinitis: stable; refills provided.  Orders Placed This Encounter  Procedures  . DG Shoulder Left    Standing Status:   Future    Number of Occurrences:   1    Standing Expiration Date:   11/12/2018    Order Specific Question:   Reason for Exam (SYMPTOM  OR DIAGNOSIS REQUIRED)    Answer:   L shoulder pain    Order Specific Question:   Preferred imaging location?    Answer:   External  . CBC with Differential/Platelet  . Comprehensive metabolic panel    Order Specific Question:   Has the patient fasted?    Answer:   No  . Lipid panel    Order Specific Question:   Has the patient fasted?    Answer:   No  . POCT glucose (manual entry)  . POCT glycosylated hemoglobin (Hb A1C)   Meds ordered this encounter  Medications  . hydrocortisone (PROCTOSOL HC) 2.5 % rectal cream    Sig: APPLY ONE APPLICATORFUL RECTALLY TWICE DAILY as needed for hemorrhoid pain/itching/bleeding    Dispense:  29 g    Refill:  3  . atorvastatin (LIPITOR) 10 MG tablet    Sig: Take 1 tablet (10 mg total) by mouth daily at 6 PM.    Dispense:  90 tablet    Refill:  1  . azelastine (ASTELIN) 0.1 % nasal spray    Sig: Place 2 sprays into both nostrils 2 (two) times daily. Use in each nostril as directed    Dispense:  30 mL     Refill:  5  . meloxicam (MOBIC) 15 MG tablet  Sig: Take 1 tablet (15 mg total) by mouth daily.    Dispense:  30 tablet    Refill:  1    Return in about 6 months (around 05/15/2018) for complete physical examiniation  ZOE Nolon Rod, MD.   Norwood Levo, M.D. Primary Care at [facility] previously [facility] [address] [place], [place]  [address] [phone] phone [phone] fax

## 2017-12-02 ENCOUNTER — Encounter: Payer: Self-pay | Admitting: Family Medicine

## 2018-02-08 ENCOUNTER — Other Ambulatory Visit: Payer: Self-pay | Admitting: Family Medicine

## 2018-02-08 DIAGNOSIS — E7800 Pure hypercholesterolemia, unspecified: Secondary | ICD-10-CM

## 2018-02-08 MED ORDER — ATORVASTATIN CALCIUM 20 MG PO TABS: 20.0000 mg | ORAL_TABLET | Freq: Every day | ORAL | 3 refills | Status: DC

## 2018-02-10 ENCOUNTER — Telehealth: Payer: Self-pay | Admitting: Family Medicine

## 2018-02-10 NOTE — Telephone Encounter (Signed)
Pt given results per Dr Andria Frames Smith,"iSugar/glucose is slightly elevated at 111. Hemoglobin A1c remains slightly elevated at 5.9. This remains in the [place]. I recommend avoiding sweetened beverages such as regular soda, sweet tea, fruit juice. I recommend limiting dessert to once per week.Liver and kidney functions are normal. Cholesterol remains elevated. I recommend increasing dose of Atorvastatin to 78m daily. I have sent in a new prescription."; the pt verbalizes understanding; not able to chart in result note because no encounter created.

## 2018-03-04 DIAGNOSIS — M25512 Pain in left shoulder: Secondary | ICD-10-CM | POA: Insufficient documentation

## 2018-03-25 ENCOUNTER — Ambulatory Visit (INDEPENDENT_AMBULATORY_CARE_PROVIDER_SITE_OTHER): Payer: BLUE CROSS/BLUE SHIELD | Admitting: Physician Assistant

## 2018-03-25 VITALS — HR 78 | Temp 98.5°F

## 2018-03-25 DIAGNOSIS — Z23 Encounter for immunization: Secondary | ICD-10-CM | POA: Diagnosis not present

## 2018-03-25 NOTE — Progress Notes (Signed)
Pt came in for flu shot placed in right deltoid.

## 2018-05-18 ENCOUNTER — Encounter: Payer: Self-pay | Admitting: Family Medicine

## 2018-05-18 ENCOUNTER — Other Ambulatory Visit: Payer: Self-pay

## 2018-05-18 ENCOUNTER — Ambulatory Visit (INDEPENDENT_AMBULATORY_CARE_PROVIDER_SITE_OTHER): Payer: Medicare Other | Admitting: Family Medicine

## 2018-05-18 VITALS — BP 120/76 | HR 83 | Temp 98.4°F | Resp 16 | Ht 64.5 in | Wt 151.0 lb

## 2018-05-18 DIAGNOSIS — Z125 Encounter for screening for malignant neoplasm of prostate: Secondary | ICD-10-CM

## 2018-05-18 DIAGNOSIS — Z23 Encounter for immunization: Secondary | ICD-10-CM

## 2018-05-18 DIAGNOSIS — Z0000 Encounter for general adult medical examination without abnormal findings: Secondary | ICD-10-CM

## 2018-05-18 DIAGNOSIS — Z8042 Family history of malignant neoplasm of prostate: Secondary | ICD-10-CM | POA: Diagnosis not present

## 2018-05-18 DIAGNOSIS — E7800 Pure hypercholesterolemia, unspecified: Secondary | ICD-10-CM

## 2018-05-18 DIAGNOSIS — Z299 Encounter for prophylactic measures, unspecified: Secondary | ICD-10-CM

## 2018-05-18 MED ORDER — ZOSTER VAC RECOMB ADJUVANTED 50 MCG/0.5ML IM SUSR: 0.5000 mL | Freq: Once | INTRAMUSCULAR | 0 refills | Status: AC

## 2018-05-18 MED ORDER — ATORVASTATIN CALCIUM 20 MG PO TABS: 20.0000 mg | ORAL_TABLET | Freq: Every day | ORAL | 3 refills | Status: DC

## 2018-05-18 NOTE — Patient Instructions (Addendum)
If you have lab work done today you will be contacted with your lab results within the next 2 weeks.  If you have not heard from [place] then please contact us. The fastest way to get your results is to register for My Chart.   IF you received an x-ray today, you will receive an invoice from [facility]. Please contact [facility] at [phone] with questions or concerns regarding your invoice.   IF you received labwork today, you will receive an invoice from [facility]. Please contact [facility] at [phone] with questions or concerns regarding your invoice.   Our billing staff will not be able to assist you with questions regarding bills from these companies.  You will be contacted with the lab results as soon as they are available. The fastest way to get your results is to activate your My Chart account. Instructions are located on the last page of this paperwork. If you have not heard from [place] regarding the results in 2 weeks, please contact this office.   Shingles Shingles is an infection that causes a painful skin rash and fluid-filled blisters. Shingles is caused by the same virus that causes chickenpox. Shingles only develops in people who:  Have had chickenpox.  Have gotten the chickenpox vaccine. (This is rare.)  The first symptoms of shingles may be itching, tingling, or pain in an area on your skin. A rash will follow in a few days or weeks. The rash is usually on one side of the body in a bandlike or beltlike pattern. Over time, the rash turns into fluid-filled blisters that break open, scab over, and dry up. Medicines may:  Help you manage pain.  Help you recover more quickly.  Help to prevent long-term problems.  Follow these instructions at home: Medicines  Take medicines only as told by your doctor.  Apply an anti-itch or numbing cream to the affected area as told by your doctor. Blister and Rash Care  Take a cool bath or put cool compresses  on the area of the rash or blisters as told by your doctor. This may help with pain and itching.  Keep your rash covered with a loose bandage (dressing). Wear loose-fitting clothing.  Keep your rash and blisters clean with mild soap and cool water or as told by your doctor.  Check your rash every day for signs of infection. These include redness, swelling, and pain that lasts or gets worse.  Do not pick your blisters.  Do not scratch your rash. General instructions  Rest as told by your doctor.  Keep all follow-up visits as told by your doctor. This is important.  Until your blisters scab over, your infection can cause chickenpox in people who have never had it or been vaccinated against it. To prevent this from happening, avoid touching other people or being around other people, especially: ? Babies. ? Pregnant women. ? Children who have eczema. ? Elderly people who have transplants. ? People who have chronic illnesses, such as leukemia or AIDS. Contact a doctor if:  Your pain does not get better with medicine.  Your pain does not get better after the rash heals.  Your rash looks infected. Signs of infection include: ? Redness. ? Swelling. ? Pain that lasts or gets worse. Get help right away if:  The rash is on your face or nose.  You have pain in your face, pain around your eye area, or loss of feeling on one side of your face.  You have ear pain or you have ringing in your ear.  You have loss of taste.  Your condition gets worse. This information is not intended to replace advice given to you by your health care provider. Make sure you discuss any questions you have with your health care provider. Document Released: 12/11/2007 Document Revised: 02/18/2016 Document Reviewed: 04/05/2014 Elsevier Interactive Patient Education  [employer].

## 2018-05-18 NOTE — Progress Notes (Signed)
QUICK REFERENCE INFORMATION: The ABCs of Providing the Annual Wellness Visit  CMS.gov Medicare Learning Network  [employer] Annual Wellness Visit  Subjective:   Earl Thomas is a 67 y.o. Male who presents for an Annual Wellness Visit.   Patient Active Problem List   Diagnosis Date Noted  . Seasonal allergic rhinitis due to pollen 10/22/2016  . BMI 26.0-26.9,adult 10/22/2016  . Renal insufficiency 04/29/2016  . Glucose intolerance (impaired glucose tolerance) 04/29/2016  . Hyperlipidemia 03/10/2012    Past Medical History:  Diagnosis Date  . Arthritis   . Glucose intolerance (impaired glucose tolerance)   . Hyperlipidemia   . 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     Past Surgical History:  Procedure Laterality Date  . ROTATOR CUFF REPAIR Right 01/02/16     Outpatient Medications Prior to Visit  Medication Sig Dispense Refill  . aspirin 325 MG tablet Take 325 mg by mouth daily.    Marland Kitchen azelastine (ASTELIN) 0.1 % nasal spray Place 2 sprays into both nostrils 2 (two) times daily. Use in each nostril as directed 30 mL 5  . hydrocortisone (PROCTOSOL HC) 2.5 % rectal cream APPLY ONE APPLICATORFUL RECTALLY TWICE DAILY as needed for hemorrhoid pain/itching/bleeding 29 g 3  . meloxicam (MOBIC) 15 MG tablet Take 1 tablet (15 mg total) by mouth daily. 30 tablet 1  . atorvastatin (LIPITOR) 20 MG tablet Take 1 tablet (20 mg total) by mouth daily at 6 PM. 90 tablet 3   No facility-administered medications prior to visit.     No Known Allergies   Family History  Problem Relation Age of Onset  . Cancer Father 69       prostate cancer  . Diabetes Father   . Heart disease Father 48       AMI late 67s  . Cancer Mother 19       Breast cancer  . Heart disease Mother        CABG at age 18  . Diabetes Brother   . Heart disease Brother        AMI x 2; CABG  . Hyperlipidemia Brother      Social History   Socioeconomic History  . Marital status:  Married    Spouse name: Not on file  . Number of children: Not on file  . Years of education: Not on file  . Highest education level: Not on file  Occupational History  . Occupation: Professor  Social Needs  . Financial resource strain: Not on file  . Food insecurity:    Worry: Not on file    Inability: Not on file  . Transportation needs:    Medical: Not on file    Non-medical: Not on file  Tobacco Use  . Smoking status: Never Smoker  . Smokeless tobacco: Never Used  Substance and Sexual Activity  . Alcohol use: No  . Drug use: No  . Sexual activity: Yes  Lifestyle  . Physical activity:    Days per week: Not on file    Minutes per session: Not on file  . Stress: Not on file  Relationships  . Social connections:    Talks on phone: Not on file    Gets together: Not on file    Attends religious service: Not on file    Active member of club or organization: Not on file    Attends meetings of clubs or organizations: Not on file    Relationship status: Not on file  Other  Topics Concern  . 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      Tobacco: pipe in 1980s      Alcohol: none      Exercise: joined [employer]; going 2-3 times per week.        Seatbelt:  100%      Recent Hospitalizations? No  Health Habits: Current exercise activities include: none Exercise: 0 times/week. Diet: in general, a "healthy" diet    Alcohol intake:   Health Risk Assessment: The patient has completed a Health Risk Assessment. This has been reveiwed with them and has been scanned into the [facility] system as an attached document.  Current Medical Providers and Suppliers: [facility] Patient Care Team: Forrest Moron, MD as PCP - General (Internal Medicine) Carol Ada, MD as Consulting Physician (Gastroenterology) No future appointments.   Age-appropriate Screening Schedule: The list below includes current immunization status and future screening recommendations based on patient's age. Orders for these recommended tests are listed in the plan section. The patient has been provided with a written plan. Immunization History  Administered Date(s) Administered  . Influenza,inj,Quad PF,6+ Mos 05/30/2015, 04/23/2016, 05/14/2017, 03/25/2018  . Influenza-Unspecified 04/07/2014  . Pneumococcal Conjugate-13 05/18/2018  . Tdap 02/13/2009, 05/14/2017  . Zoster 04/12/2014    Health Maintenance reviewed -  Pneumovax given   Depression Screen-PHQ2/9 completed today  Depression screen [facility] 2/9 05/18/2018 11/12/2017 05/14/2017 10/22/2016 04/23/2016  Decreased Interest 0 0 0 0 0  Down, Depressed, Hopeless 0 0 0 0 0  PHQ - 2 Score 0 0 0 0 0       Depression Severity and Treatment Recommendations:  0-4= None  5-9= Mild / Treatment: Support, educate to call if worse; return in one month  10-14= Moderate / Treatment: Support, watchful waiting; Antidepressant or Psycotherapy  15-19= Moderately severe / Treatment: Antidepressant OR Psychotherapy  >= 20 = Major depression, severe / Antidepressant AND Psychotherapy  Functional Status Survey:   Is the patient deaf or have difficulty hearing?: No Does the patient have difficulty seeing, even when wearing glasses/contacts?: No Does the patient have difficulty concentrating, remembering, or making decisions?: No Does the patient have difficulty walking or climbing stairs?: No Does the patient have difficulty dressing or bathing?: No Does the patient have difficulty doing errands alone such as visiting a doctor's office or shopping?: No  Hearing Evaluation: 1. Do you have trouble hearing the television when others do not?   No 2. Do you have to strain to  hear/understand conversations? No   Advanced Care Planning: 1. Patient has executed an Advance Directive: No 2. If no, patient was given the opportunity to execute an Advance Directive today? Yes 3. Are the patient's advanced directives in [place]? No 4. This patient has the ability to prepare an Advance Directive: Yes 5. Provider is willing to follow the patient's wishes: Yes  Cognitive Assessment: Does the patient have evidence of cognitive impairment? No The patient does not have any evidence of any cognitive problems and denies any  change in mood/affect, appearance, speech, memory or motor skills.  Identification of Risk Factors: Risk factors include: hyperlipidemia  ROS  Objective:   Vitals:   05/18/18 0856  BP: 120/76  Pulse: 83  Resp: 16  Temp: 98.4 F (36.9 C)  TempSrc: Oral  SpO2: 95%  Weight: 151 lb (68.5 kg)  Height: 5' 4.5" (1.638 m)    Body mass index is 25.52 kg/m.  Physical Exam  Constitutional: He is oriented to person, place, and time. He appears well-developed and well-nourished.  HENT:  Head: Normocephalic and atraumatic.  Eyes: Conjunctivae and EOM are normal.  Cardiovascular: Normal rate, regular rhythm and normal heart sounds.  No murmur heard. Pulmonary/Chest: Effort normal and breath sounds normal. No stridor. No respiratory distress. He has no wheezes.  Genitourinary: Rectum normal, prostate normal and penis normal.  Genitourinary Comments: Chaperone present  Neurological: He is alert and oriented to person, place, and time.       Assessment/Plan:   Patient Self-Management and Personalized Health Advice The patient has been provided with information about:  follow a low fat, low cholesterol diet  During the course of the visit the patient was educated and counseled about appropriate screening and preventive services including:   return annually or prn     Body mass index is 25.52 kg/m. Discussed the patient's BMI with him. The  BMI BMI is in the acceptable range  Earl Thomas was seen today for annual exam.  Diagnoses and all orders for this visit:  Medicare annual wellness visit, initial- discussed screenings    Pure hypercholesterolemia- discussed lipid goals -     Lipid panel -     Comprehensive metabolic panel -     atorvastatin (LIPITOR) 20 MG tablet; Take 1 tablet (20 mg total) by mouth daily at 6 PM.  Family history of prostate cancer in father -     PSA  Screening for prostate cancer -     PSA  Need for shingles vaccine -     Zoster Vaccine Adjuvanted [facility]) injection; Inject 0.5 mLs into the muscle once for 1 dose.  Need for prophylactic vaccination against Streptococcus pneumoniae (pneumococcus) -     Pneumococcal conjugate vaccine 13-valent IM  Need for prophylactic measure      No follow-ups on file.  No future appointments.  Patient Instructions       If you have lab work done today you will be contacted with your lab results within the next 2 weeks.  If you have not heard from [place] then please contact us. The fastest way to get your results is to register for My Chart.   IF you received an x-ray today, you will receive an invoice from [facility]. Please contact [facility] at [phone] with questions or concerns regarding your invoice.   IF you received labwork today, you will receive an invoice from [facility]. Please contact [facility] at [phone] with questions or concerns regarding your invoice.   Our billing staff will not be able to assist you with questions regarding bills from these companies.  You will be contacted with the lab results as soon as they are available. The fastest way to get your results is to activate your My Chart account. Instructions are located on the last page of this paperwork. If you have not heard from [place] regarding the results in 2 weeks, please contact this office.   Shingles Shingles is an infection that causes a  painful skin rash and fluid-filled blisters. Shingles is caused by the same virus that causes chickenpox. Shingles only develops in people who:  Have had chickenpox.  Have gotten the chickenpox vaccine. (This is rare.)  The first  symptoms of shingles may be itching, tingling, or pain in an area on your skin. A rash will follow in a few days or weeks. The rash is usually on one side of the body in a bandlike or beltlike pattern. Over time, the rash turns into fluid-filled blisters that break open, scab over, and dry up. Medicines may:  Help you manage pain.  Help you recover more quickly.  Help to prevent long-term problems.  Follow these instructions at home: Medicines  Take medicines only as told by your doctor.  Apply an anti-itch or numbing cream to the affected area as told by your doctor. Blister and Rash Care  Take a cool bath or put cool compresses on the area of the rash or blisters as told by your doctor. This may help with pain and itching.  Keep your rash covered with a loose bandage (dressing). Wear loose-fitting clothing.  Keep your rash and blisters clean with mild soap and cool water or as told by your doctor.  Check your rash every day for signs of infection. These include redness, swelling, and pain that lasts or gets worse.  Do not pick your blisters.  Do not scratch your rash. General instructions  Rest as told by your doctor.  Keep all follow-up visits as told by your doctor. This is important.  Until your blisters scab over, your infection can cause chickenpox in people who have never had it or been vaccinated against it. To prevent this from happening, avoid touching other people or being around other people, especially: ? Babies. ? Pregnant women. ? Children who have eczema. ? Elderly people who have transplants. ? People who have chronic illnesses, such as leukemia or AIDS. Contact a doctor if:  Your pain does not get better with  medicine.  Your pain does not get better after the rash heals.  Your rash looks infected. Signs of infection include: ? Redness. ? Swelling. ? Pain that lasts or gets worse. Get help right away if:  The rash is on your face or nose.  You have pain in your face, pain around your eye area, or loss of feeling on one side of your face.  You have ear pain or you have ringing in your ear.  You have loss of taste.  Your condition gets worse. This information is not intended to replace advice given to you by your health care provider. Make sure you discuss any questions you have with your health care provider. Document Released: 12/11/2007 Document Revised: 02/18/2016 Document Reviewed: 04/05/2014 Elsevier Interactive Patient Education  [employer].       An after visit summary with all of these plans was given to the patient.

## 2018-05-19 LAB — COMPREHENSIVE METABOLIC PANEL
ALT: 29 IU/L (ref 0–44)
AST: 22 IU/L (ref 0–40)
Albumin/Globulin Ratio: 1.5 (ref 1.2–2.2)
Albumin: 4.7 g/dL (ref 3.6–4.8)
Alkaline Phosphatase: 53 IU/L (ref 39–117)
BUN/Creatinine Ratio: 11 (ref 10–24)
BUN: 14 mg/dL (ref 8–27)
Bilirubin Total: 0.6 mg/dL (ref 0.0–1.2)
CO2: 23 mmol/L (ref 20–29)
Calcium: 9 mg/dL (ref 8.6–10.2)
Chloride: 103 mmol/L (ref 96–106)
Creatinine, Ser: 1.3 mg/dL — ABNORMAL HIGH (ref 0.76–1.27)
GFR calc Af Amer: 66 mL/min/{1.73_m2} (ref >59)
GFR calc non Af Amer: 57 mL/min/{1.73_m2} — ABNORMAL LOW (ref >59)
Globulin, Total: 3.1 g/dL (ref 1.5–4.5)
Glucose: 88 mg/dL (ref 65–99)
Potassium: 4.1 mmol/L (ref 3.5–5.2)
Sodium: 141 mmol/L (ref 134–144)
Total Protein: 7.8 g/dL (ref 6.0–8.5)

## 2018-05-19 LAB — LIPID PANEL
Chol/HDL Ratio: 3.4 ratio (ref 0.0–5.0)
Cholesterol, Total: 134 mg/dL (ref 100–199)
HDL: 40 mg/dL (ref >39)
LDL Calculated: 85 mg/dL (ref 0–99)
Triglycerides: 43 mg/dL (ref 0–149)
VLDL Cholesterol Cal: 9 mg/dL (ref 5–40)

## 2018-05-19 LAB — PSA: Prostate Specific Ag, Serum: 0.6 ng/mL (ref 0.0–4.0)

## 2018-05-24 ENCOUNTER — Encounter: Payer: Self-pay | Admitting: Family Medicine

## 2018-05-28 ENCOUNTER — Encounter: Payer: Self-pay | Admitting: Family Medicine

## 2018-06-01 ENCOUNTER — Other Ambulatory Visit: Payer: Self-pay | Admitting: Family Medicine

## 2018-06-01 NOTE — Telephone Encounter (Signed)
Copied from [place] [id number]. Topic: Quick Communication - Rx Refill/Question >> Jun 01, 2018 10:16 AM Yvette Rack wrote: Medication: meloxicam (MOBIC) 15 MG tablet  Has the patient contacted their pharmacy? yes - Pt stated he contacted pharmacy and was told they have submitted request to provider but have not received response  Preferred Pharmacy (with phone number or street name): [place], [place]. [phone] (Phone)  [phone] (Fax)   Agent: Please be advised that RX refills may take up to 3 business days. We ask that you follow-up with your pharmacy.

## 2018-06-02 MED ORDER — MELOXICAM 15 MG PO TABS: 15.0000 mg | ORAL_TABLET | Freq: Every day | ORAL | 1 refills | Status: DC

## 2018-07-14 ENCOUNTER — Encounter: Payer: Self-pay | Admitting: Family Medicine

## 2018-07-16 MED ORDER — TADALAFIL 20 MG PO TABS: 10.0000 mg | ORAL_TABLET | ORAL | 3 refills | Status: DC | PRN

## 2018-07-28 DIAGNOSIS — M25531 Pain in right wrist: Secondary | ICD-10-CM | POA: Diagnosis not present

## 2018-07-28 DIAGNOSIS — M25532 Pain in left wrist: Secondary | ICD-10-CM | POA: Diagnosis not present

## 2018-08-27 ENCOUNTER — Other Ambulatory Visit: Payer: Self-pay | Admitting: Family Medicine

## 2018-08-27 DIAGNOSIS — M25532 Pain in left wrist: Secondary | ICD-10-CM | POA: Diagnosis not present

## 2018-08-27 NOTE — Telephone Encounter (Signed)
Requested medication (s) are due for refill today -yes  Requested medication (s) are on the active medication list -yes  Future visit scheduled -no  Last refill: 06/02/18  Notes to clinic: Patient is requesting medication that fails lab protocol.  Requested Prescriptions  Pending Prescriptions Disp Refills   meloxicam (MOBIC) 15 MG tablet [Pharmacy Med Name: Meloxicam 15 MG Oral Tablet] 30 tablet 0    Sig: TAKE 1 TABLET BY MOUTH ONCE DAILY     Analgesics:  COX2 Inhibitors Failed - 08/27/2018  7:38 AM      Failed - Cr in normal range and within 360 days    Creat  Date Value Ref Range Status  04/23/2016 1.21 0.70 - 1.25 mg/dL Final    Comment:      For patients > or = 66 years of age: The upper reference limit for Creatinine is approximately 13% higher for people identified as African-American.      Creatinine, Ser  Date Value Ref Range Status  05/18/2018 1.30 (H) 0.76 - 1.27 mg/dL Final         Passed - HGB in normal range and within 360 days    Hemoglobin  Date Value Ref Range Status  11/12/2017 14.8 13.0 - 17.7 g/dL Final         Passed - Patient is not pregnant      Passed - Valid encounter within last 12 months    Recent Outpatient Visits          3 months ago Medicare annual wellness visit, initial   Primary Care at [facility], Arlie Solomons, MD   9 months ago Pure hypercholesterolemia   Primary Care at [facility], Renette Butters, MD   1 year ago Routine physical examination   Primary Care at [facility], Renette Butters, MD   1 year ago Glucose intolerance (impaired glucose tolerance)   Primary Care at [facility], Renette Butters, MD   2 years ago Pure hypercholesterolemia   Primary Care at [facility], Renette Butters, MD              Requested Prescriptions  Pending Prescriptions Disp Refills   meloxicam (MOBIC) 15 MG tablet [Pharmacy Med Name: Meloxicam 15 MG Oral Tablet] 30 tablet 0    Sig: TAKE 1 TABLET BY MOUTH ONCE DAILY     Analgesics:  COX2 Inhibitors Failed  - 08/27/2018  7:38 AM      Failed - Cr in normal range and within 360 days    Creat  Date Value Ref Range Status  04/23/2016 1.21 0.70 - 1.25 mg/dL Final    Comment:      For patients > or = 66 years of age: The upper reference limit for Creatinine is approximately 13% higher for people identified as African-American.      Creatinine, Ser  Date Value Ref Range Status  05/18/2018 1.30 (H) 0.76 - 1.27 mg/dL Final         Passed - HGB in normal range and within 360 days    Hemoglobin  Date Value Ref Range Status  11/12/2017 14.8 13.0 - 17.7 g/dL Final         Passed - Patient is not pregnant      Passed - Valid encounter within last 12 months    Recent Outpatient Visits          3 months ago Medicare annual wellness visit, initial   Primary Care at [facility], Arlie Solomons, MD   9 months  ago Pure hypercholesterolemia   Primary Care at [facility], Renette Butters, MD   1 year ago Routine physical examination   Primary Care at [facility], Renette Butters, MD   1 year ago Glucose intolerance (impaired glucose tolerance)   Primary Care at [facility], Renette Butters, MD   2 years ago Pure hypercholesterolemia   Primary Care at [facility], Renette Butters, MD

## 2018-08-27 NOTE — Telephone Encounter (Signed)
Pt is requesting medication, last ov Nov 2019

## 2018-08-27 NOTE — Telephone Encounter (Signed)
Please let patient know that I can not refill his meloxiam as his last kidney function was slightly decreased. Medications such as meloxicam, ibuprofen, naproxen, motrin, aleve is taken often/daily can hurt your kidneys. If he wants to discuss further, he should make an appointment with his PCP, Dr Nolon Rod Thank you

## 2018-08-30 ENCOUNTER — Other Ambulatory Visit: Payer: Self-pay | Admitting: Family Medicine

## 2018-09-09 NOTE — Telephone Encounter (Signed)
Pt is coming in to discuss the slight kidney function per the note from Dr. Pamella Pert Pt is aware that he is not to take mobic or the other mentioned meds. He says he has a concern with the weight he is losing as well. He is making an appt to come and see you

## 2018-09-18 ENCOUNTER — Ambulatory Visit (INDEPENDENT_AMBULATORY_CARE_PROVIDER_SITE_OTHER): Payer: Medicare Other | Admitting: Family Medicine

## 2018-09-18 ENCOUNTER — Encounter: Payer: Self-pay | Admitting: Family Medicine

## 2018-09-18 ENCOUNTER — Other Ambulatory Visit: Payer: Self-pay

## 2018-09-18 VITALS — BP 134/72 | HR 71 | Temp 97.8°F | Resp 17 | Ht 64.5 in | Wt 152.4 lb

## 2018-09-18 DIAGNOSIS — R7989 Other specified abnormal findings of blood chemistry: Secondary | ICD-10-CM | POA: Diagnosis not present

## 2018-09-18 LAB — BASIC METABOLIC PANEL
BUN/Creatinine Ratio: 14 (ref 10–24)
BUN: 16 mg/dL (ref 8–27)
CO2: 20 mmol/L (ref 20–29)
Calcium: 9.2 mg/dL (ref 8.6–10.2)
Chloride: 102 mmol/L (ref 96–106)
Creatinine, Ser: 1.13 mg/dL (ref 0.76–1.27)
GFR calc Af Amer: 78 mL/min/{1.73_m2} (ref >59)
GFR calc non Af Amer: 68 mL/min/{1.73_m2} (ref >59)
Glucose: 89 mg/dL (ref 65–99)
Potassium: 4.3 mmol/L (ref 3.5–5.2)
Sodium: 138 mmol/L (ref 134–144)

## 2018-09-18 MED ORDER — DICLOFENAC SODIUM 1 % TD GEL: 2.0000 g | Freq: Four times a day (QID) | TRANSDERMAL | 1 refills | Status: DC

## 2018-09-18 MED ORDER — MELOXICAM 7.5 MG PO TABS: 7.5000 mg | ORAL_TABLET | Freq: Every day | ORAL | 0 refills | Status: DC

## 2018-09-18 NOTE — Progress Notes (Signed)
Established Patient Office Visit  Subjective:  Patient ID: Earl Thomas, male    DOB: Nov 25, 1950  Age: 68 y.o. MRN: [id number]  CC:  Chief Complaint  Patient presents with  . Medication Management    f/u  . Labs Only    f/u    HPI Earl Thomas presents for    He is taking meloxicam for wrist pain He saw Orthopedics for joint pains and had hydrocortisone He states that he has been doing the meloxicam every day but cut back on his meloxicam every day tylenol arthritis is not helping the pain as much  Lab Results  Component Value Date   CREATININE 1.30 (H) 05/18/2018     Past Medical History:  Diagnosis Date  . Arthritis   . Glucose intolerance (impaired glucose tolerance)   . Hyperlipidemia   . 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    Past Surgical History:  Procedure Laterality Date  . ROTATOR CUFF REPAIR Right 01/02/16    Family History  Problem Relation Age of Onset  . Cancer Father 30       prostate cancer  . Diabetes Father   . Heart disease Father 75       AMI late 75s  . Cancer Mother 65       Breast cancer  . Heart disease Mother        CABG at age 75  . Diabetes Brother   . Heart disease Brother        AMI x 2; CABG  . Hyperlipidemia Brother     Social History   Socioeconomic History  . Marital status: Married    Spouse name: Not on file  . Number of children: Not on file  . Years of education: Not on file  . Highest education level: Not on file  Occupational History  . Occupation: Professor  Social Needs  . Financial resource strain: Not on file  . Food insecurity:    Worry: Not on file    Inability: Not on file  . Transportation needs:    Medical: Not on file    Non-medical: Not on file  Tobacco Use  . Smoking status: Never Smoker  . Smokeless tobacco: Never Used  Substance and Sexual Activity  . Alcohol use: No  . Drug use: No  . Sexual activity: Yes  Lifestyle  . Physical activity:   Days per week: Not on file    Minutes per session: Not on file  . Stress: Not on file  Relationships  . Social connections:    Talks on phone: Not on file    Gets together: Not on file    Attends religious service: Not on file    Active member of club or organization: Not on file    Attends meetings of clubs or organizations: Not on file    Relationship status: Not on file  . Intimate partner violence:    Fear of current or ex partner: Not on file    Emotionally abused: Not on file    Physically abused: Not on file    Forced sexual activity: Not on file  Other Topics Concern  . 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      Tobacco: pipe in 1980s      Alcohol: none      Exercise: joined [employer]; going 2-3 times per week.        Seatbelt:  100%    Outpatient Medications Prior to Visit  Medication Sig Dispense Refill  . aspirin 325 MG tablet Take 325 mg by mouth daily.    Marland Kitchen atorvastatin (LIPITOR) 20 MG tablet Take 1 tablet (20 mg total) by mouth daily at 6 PM. 90 tablet 3  . azelastine (ASTELIN) 0.1 % nasal spray Place 2 sprays into both nostrils 2 (two) times daily. Use in each nostril as directed 30 mL 5  . hydrocortisone (PROCTOSOL HC) 2.5 % rectal cream APPLY ONE APPLICATORFUL RECTALLY TWICE DAILY as needed for hemorrhoid pain/itching/bleeding 29 g 3  . meloxicam (MOBIC) 15 MG tablet TAKE 1 TABLET BY MOUTH ONCE DAILY 30 tablet 2  . tadalafil (ADCIRCA/CIALIS) 20 MG tablet Take 0.5 tablets (10 mg total) by mouth every other day as needed for erectile dysfunction. 5 tablet 3   No facility-administered medications prior to visit.     No Known Allergies  ROS Review of Systems Review of Systems   Constitutional: Negative for activity change, appetite change, chills and fever.  HENT: Negative for congestion, nosebleeds, trouble swallowing and voice change.   Respiratory: Negative for cough, shortness of breath and wheezing.   Gastrointestinal: Negative for diarrhea, nausea and vomiting.  Genitourinary: Negative for difficulty urinating, dysuria, flank pain and hematuria.  Musculoskeletal: Negative for back pain, joint swelling and neck pain.  Neurological: Negative for dizziness, speech difficulty, light-headedness and numbness.  See HPI. All other review of systems negative.     Objective:    Physical Exam  BP 134/72 (BP Location: Right Arm, Patient Position: Sitting, Cuff Size: Normal)   Pulse 71   Temp 97.8 F (36.6 C) (Oral)   Resp 17   Ht 5' 4.5" (1.638 m)   Wt 152 lb 6.4 oz (69.1 kg)   SpO2 93%   BMI 25.76 kg/m  Wt Readings from Last 3 Encounters:  09/18/18 152 lb 6.4 oz (69.1 kg)  05/18/18 151 lb (68.5 kg)  11/12/17 158 lb (71.7 kg)   Physical Exam  Constitutional: Oriented to person, place, and time. Appears well-developed and well-nourished.  HENT:  Head: Normocephalic and atraumatic.  Eyes: Conjunctivae and EOM are normal.  Cardiovascular: Normal rate, regular rhythm, normal heart sounds and intact distal pulses.  No murmur heard. Pulmonary/Chest: Effort normal and breath sounds normal. No stridor. No respiratory distress. Has no wheezes.  Neurological: Is alert and oriented to person, place, and time.  Skin: Skin is warm. Capillary refill takes less than 2 seconds.  Psychiatric: Has a normal mood and affect. Behavior is normal. Judgment and thought content normal.    There are no preventive care reminders to display for this patient.  There are no preventive care reminders to display for this patient.  Lab Results  Component Value Date   TSH 2.230 05/14/2017   Lab Results  Component Value Date   WBC 4.8 11/12/2017   HGB 14.8 11/12/2017   HCT  45.3 11/12/2017   MCV 86 11/12/2017   PLT 197 11/12/2017   Lab Results  Component Value Date   NA 141 05/18/2018   K 4.1 05/18/2018   CO2 23 05/18/2018   GLUCOSE 88 05/18/2018   BUN 14 05/18/2018   CREATININE 1.30 (H) 05/18/2018  BILITOT 0.6 05/18/2018   ALKPHOS 53 05/18/2018   AST 22 05/18/2018   ALT 29 05/18/2018   PROT 7.8 05/18/2018   ALBUMIN 4.7 05/18/2018   CALCIUM 9.0 05/18/2018   Lab Results  Component Value Date   CHOL 134 05/18/2018   Lab Results  Component Value Date   HDL 40 05/18/2018   Lab Results  Component Value Date   LDLCALC 85 05/18/2018   Lab Results  Component Value Date   TRIG 43 05/18/2018   Lab Results  Component Value Date   CHOLHDL 3.4 05/18/2018   Lab Results  Component Value Date   HGBA1C 5.9 11/12/2017      Assessment & Plan:   Problem List Items Addressed This Visit    None    Visit Diagnoses    Elevated serum creatinine    -  Primary   Relevant Orders   Basic Metabolic Panel     Will add voltaren and plan for patient to drecrease his meloxicam to 7.26m   Meds ordered this encounter  Medications  . diclofenac sodium (VOLTAREN) 1 % GEL    Sig: Apply 2 g topically 4 (four) times daily.    Dispense:  100 g    Refill:  1    Follow-up: Return in about 3 months (around 12/19/2018) for kidney function test .    ZForrest [name]

## 2018-09-18 NOTE — Patient Instructions (Addendum)
If you have lab work done today you will be contacted with your lab results within the next 2 weeks.  If you have not heard from [place] then please contact us. The fastest way to get your results is to register for My Chart.   IF you received an x-ray today, you will receive an invoice from [facility]. Please contact [facility] at [phone] with questions or concerns regarding your invoice.   IF you received labwork today, you will receive an invoice from [place]. Please contact [facility] at [phone] with questions or concerns regarding your invoice.   Our billing staff will not be able to assist you with questions regarding bills from these companies.  You will be contacted with the lab results as soon as they are available. The fastest way to get your results is to activate your My Chart account. Instructions are located on the last page of this paperwork. If you have not heard from [place] regarding the results in 2 weeks, please contact this office.     Serum Creatinine Test Why am I having this test? Creatinine is a waste product of normal muscle activity (contraction). Your kidneys filter creatinine from your blood and remove it from your body through urination. This test is a way to measure kidney function. Your creatinine is usually done with other tests of your kidneys (renal function studies). Your health care provider may recommend this test if he or she suspects that you have a condition that is affecting your kidney function. This test may also be done as a part of routine blood work to assess your overall health or to monitor certain medical treatments. Normal blood (serum) creatinine levels depend on the muscle mass of your body. In general, men and people with larger muscle mass will have a slightly higher creatinine level. What is being tested? This test measures the amount of creatinine in your blood. What kind of sample is taken?     A blood sample  is required for this test. It is usually collected by inserting a needle into a blood vessel or by sticking a finger with a small needle. For children, the blood sample is usually collected by sticking the child's heel with a small needle (heel stick). Tell a health care provider about:  All medicines you are taking, including vitamins, herbs, eye drops, creams, and over-the-counter medicines. How are the results reported? Your test results will be reported as a value that indicates the amount of creatinine in your blood. Your health care provider will compare your results to normal ranges that were established after testing a large group of people (reference ranges). Reference ranges may vary among labs and hospitals. For this test, common normal reference ranges are:  Children or adolescents: ? Newborn: 0.3-1.2 mg/dL. ? Infant: 0.2-0.4 mg/dL. ? Child: 0.3-0.7 mg/dL. ? Adolescent: 0.5-1 mg/dL.  Adult male: 0.5-1.1 mg/dL.  Adult male: 0.6-1.2 mg/dL. The reference range may be higher in people who do resistance exercise to increase their muscle mass. Elderly people who have lost muscle mass may have lower values. What do the results mean? Abnormally high levels of serum creatinine can be caused by many health conditions. These may include:  Kidney disease.  Urinary tract obstruction.  Lower-than-normal blood flow to the kidneys.  Kidney damage from the release of molecules into your bloodstream that is caused by muscle damage (rhabdomyolysis).  A condition that causes enlarged bones (acromegaly).  Gigantism.  Diabetes. Abnormally low levels of serum  creatinine can result from decreased muscle mass. Talk with your health care provider about what your results mean. Questions to ask your health care provider Ask your health care provider, or the department that is doing the test:  When will my results be ready?  How will I get my results?  What are my treatment  options?  What other tests do I need?  What are my next steps? Summary  Creatinine is a waste product of normal muscle activity (contraction). Your kidneys filter creatinine from your blood (serum) and remove it through urination.  You may have this test to check whether your kidneys are working as they should. The test may also be used to evaluate your overall health or to monitor certain medical treatments.  Abnormally high levels of serum creatinine can be caused by many health conditions. Abnormally low levels can result from decreased muscle mass.  Talk with your health care provider about what your test results mean. This information is not intended to replace advice given to you by your health care provider. Make sure you discuss any questions you have with your health care provider. Document Released: 07/17/2004 Document Revised: 03/19/2017 Document Reviewed: 03/19/2017 Elsevier Interactive Patient Education  [employer].

## 2018-09-22 DIAGNOSIS — M25531 Pain in right wrist: Secondary | ICD-10-CM | POA: Diagnosis not present

## 2018-09-22 DIAGNOSIS — M25532 Pain in left wrist: Secondary | ICD-10-CM | POA: Diagnosis not present

## 2018-10-20 DIAGNOSIS — M25532 Pain in left wrist: Secondary | ICD-10-CM | POA: Diagnosis not present

## 2018-10-20 DIAGNOSIS — M25531 Pain in right wrist: Secondary | ICD-10-CM | POA: Diagnosis not present

## 2018-11-19 DIAGNOSIS — M25532 Pain in left wrist: Secondary | ICD-10-CM | POA: Diagnosis not present

## 2018-11-19 DIAGNOSIS — M25531 Pain in right wrist: Secondary | ICD-10-CM | POA: Diagnosis not present

## 2018-11-26 DIAGNOSIS — M25532 Pain in left wrist: Secondary | ICD-10-CM | POA: Diagnosis not present

## 2018-12-01 DIAGNOSIS — M25531 Pain in right wrist: Secondary | ICD-10-CM | POA: Diagnosis not present

## 2018-12-01 DIAGNOSIS — M25532 Pain in left wrist: Secondary | ICD-10-CM | POA: Diagnosis not present

## 2018-12-10 DIAGNOSIS — M79641 Pain in right hand: Secondary | ICD-10-CM | POA: Diagnosis not present

## 2018-12-10 DIAGNOSIS — Z6824 Body mass index (BMI) 24.0-24.9, adult: Secondary | ICD-10-CM | POA: Diagnosis not present

## 2018-12-10 DIAGNOSIS — M79642 Pain in left hand: Secondary | ICD-10-CM | POA: Diagnosis not present

## 2018-12-10 DIAGNOSIS — M2550 Pain in unspecified joint: Secondary | ICD-10-CM | POA: Diagnosis not present

## 2018-12-10 DIAGNOSIS — R5382 Chronic fatigue, unspecified: Secondary | ICD-10-CM | POA: Diagnosis not present

## 2018-12-11 ENCOUNTER — Other Ambulatory Visit: Payer: Self-pay

## 2018-12-11 ENCOUNTER — Ambulatory Visit (INDEPENDENT_AMBULATORY_CARE_PROVIDER_SITE_OTHER): Payer: Medicare Other | Admitting: Family Medicine

## 2018-12-11 ENCOUNTER — Encounter: Payer: Self-pay | Admitting: Family Medicine

## 2018-12-11 VITALS — BP 122/72 | HR 81 | Temp 97.9°F | Ht 65.0 in | Wt 147.0 lb

## 2018-12-11 DIAGNOSIS — M19049 Primary osteoarthritis, unspecified hand: Secondary | ICD-10-CM | POA: Diagnosis not present

## 2018-12-11 DIAGNOSIS — Z5181 Encounter for therapeutic drug level monitoring: Secondary | ICD-10-CM

## 2018-12-11 NOTE — Patient Instructions (Addendum)
  Stop taking meloxicam while on prednisone Follow up with [facility] as instructed Return to clinic as needed   If you have lab work done today you will be contacted with your lab results within the next 2 weeks.  If you have not heard from [place] then please contact us. The fastest way to get your results is to register for My Chart.   IF you received an x-ray today, you will receive an invoice from [facility]. Please contact [facility] at [phone] with questions or concerns regarding your invoice.   IF you received labwork today, you will receive an invoice from [place]. Please contact [facility] at [phone] with questions or concerns regarding your invoice.   Our billing staff will not be able to assist you with questions regarding bills from these companies.  You will be contacted with the lab results as soon as they are available. The fastest way to get your results is to activate your My Chart account. Instructions are located on the last page of this paperwork. If you have not heard from [place] regarding the results in 2 weeks, please contact this office.

## 2018-12-11 NOTE — Progress Notes (Addendum)
Established Patient Office Visit  Subjective:  Patient ID: Earl Thomas, male    DOB: [birth year]  Age: 68 y.o. MRN: [id number]  CC:  Chief Complaint  Patient presents with  . 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 Arthritis   . Glucose intolerance (impaired glucose tolerance)   . Hyperlipidemia   . 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    Past Surgical History:  Procedure Laterality Date  . ROTATOR CUFF REPAIR Right 01/02/16    Family History  Problem Relation Age of Onset  . Cancer Father 18       prostate cancer  . Diabetes Father   . Heart disease Father 25       AMI late 62s  . Cancer Mother 27       Breast cancer  . Heart disease Mother        CABG at age 39  . Diabetes Brother   . Heart disease Brother        AMI x 2; CABG  . Hyperlipidemia Brother     Social History   Socioeconomic History  . Marital status: Married    Spouse name: Not on file  . Number of children: 2  . Years of education: Not on file  . Highest education level: Not on file  Occupational History  . Occupation: Professor  Social Needs  . Financial resource strain: Not on file  . Food insecurity:    Worry: Not on file    Inability: Not on file  . Transportation needs:    Medical: Not on file    Non-medical: Not on file  Tobacco Use  . Smoking status: Never Smoker  .  Smokeless tobacco: Never Used  Substance and Sexual Activity  . Alcohol use: No  . Drug use: No  . Sexual activity: Yes  Lifestyle  . Physical activity:    Days per week: Not on file    Minutes per session: Not on file  . Stress: Not on file  Relationships  . Social connections:    Talks on phone: Not on file    Gets together: Not on file    Attends religious service: Not on file    Active member of club or organization: Not on file    Attends meetings of clubs or organizations: Not on file    Relationship status: Not on file  . Intimate partner violence:    Fear of current or ex partner: Not on file    Emotionally abused: Not on file    Physically abused: Not on file    Forced sexual activity: Not on file  Other Topics Concern  . 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      Tobacco: pipe in 1980s      Alcohol: none      Exercise: joined [employer]; going 2-3 times per week.        Seatbelt:  100%    Outpatient Medications Prior to Visit  Medication Sig Dispense Refill  . aspirin EC 81 MG tablet Take 81 mg by mouth daily.    Marland Kitchen atorvastatin (LIPITOR) 20 MG tablet Take 1 tablet (20 mg total) by mouth daily at 6 PM. 90 tablet 3  . azelastine (ASTELIN) 0.1 % nasal spray Place 2 sprays into both nostrils 2 (two) times daily. Use in each nostril as directed 30 mL 5  . diclofenac sodium (VOLTAREN) 1 % GEL Apply 2 g topically 4 (four) times daily. 100 g 1  . hydrocortisone (PROCTOSOL HC) 2.5 % rectal cream APPLY ONE APPLICATORFUL RECTALLY TWICE DAILY as needed for hemorrhoid pain/itching/bleeding 29 g 3  . meloxicam (MOBIC) 7.5 MG tablet Take 1 tablet (7.5 mg total) by mouth daily. 30 tablet 0  .  tadalafil (ADCIRCA/CIALIS) 20 MG tablet Take 0.5 tablets (10 mg total) by mouth every other day as needed for erectile dysfunction. 5 tablet 3  . aspirin 325 MG tablet Take 325 mg by mouth daily.    . predniSONE (STERAPRED UNI-PAK 48 TAB) 5 MG (48) TBPK tablet     . tadalafil (CIALIS) 20 MG tablet Take 20 mg by mouth daily.     No facility-administered medications prior to visit.     No Known Allergies  ROS Review of Systems Review of Systems  Constitutional: Negative for activity change, appetite change, chills and fever.  HENT: Negative for congestion, nosebleeds, trouble swallowing and voice change.   Respiratory: Negative for cough, shortness of breath and wheezing.   Gastrointestinal: Negative for diarrhea, nausea and vomiting.  Genitourinary: Negative for difficulty urinating, dysuria, flank pain and hematuria.  Musculoskeletal: Negative for back pain, joint swelling and neck pain.  Neurological: Negative for dizziness, speech difficulty, light-headedness and numbness.  See HPI. All other review of systems negative.     Objective:    Physical Exam  BP 122/72   Pulse 81   Temp 97.9 F (36.6 C) (Oral)   Ht _0  (1.651 m) Comment: per pt  Wt 147 lb (66.7 kg) Comment: per pt  SpO2 97%   BMI 24.46 kg/m  Wt Readings from Last 3 Encounters:  12/11/18 147 lb (66.7 kg)  09/18/18 152 lb 6.4 oz (69.1 kg)  05/18/18 151 lb (68.5 kg)   Physical Exam  Constitutional: Oriented to person, place, and time. Appears well-developed and well-nourished.  HENT:  Head: Normocephalic and atraumatic.  Eyes: Conjunctivae and EOM are normal.  Ear: TM clear bilaterally, increased fluid noted Cardiovascular: Normal rate, regular rhythm, normal heart sounds and intact distal pulses.  No murmur heard. Pulmonary/Chest: Effort normal and breath sounds normal. No stridor. No respiratory distress. Has no wheezes.  Neurological: Is alert and oriented to person, place, and time.  Skin: Skin is  warm. Capillary refill takes less than 2 seconds.  Psychiatric: Has a normal mood and affect. Behavior is normal. Judgment and thought content normal.    There are no preventive care reminders to display for this patient.  There are no preventive care reminders to display for this patient.  Lab Results  Component Value Date   TSH 2.230 05/14/2017   Lab Results  Component Value Date   WBC 4.8 11/12/2017   HGB 14.8 11/12/2017   HCT 45.3 11/12/2017   MCV 86 11/12/2017   PLT 197 11/12/2017   Lab Results  Component Value Date   NA 138 09/18/2018   K 4.3 09/18/2018   CO2 20 09/18/2018   GLUCOSE 89 09/18/2018   BUN 16 09/18/2018   CREATININE 1.13 09/18/2018   BILITOT 0.6 05/18/2018   ALKPHOS 53 05/18/2018   AST 22 05/18/2018   ALT 29 05/18/2018   PROT 7.8 05/18/2018   ALBUMIN 4.7 05/18/2018   CALCIUM 9.2 09/18/2018   Lab Results  Component Value Date   CHOL 134 05/18/2018   Lab Results  Component Value Date   HDL 40 05/18/2018   Lab Results  Component Value Date   LDLCALC 85 05/18/2018   Lab Results  Component Value Date   TRIG 43 05/18/2018   Lab Results  Component Value Date   CHOLHDL 3.4 05/18/2018   Lab Results  Component Value Date   HGBA1C 5.9 11/12/2017      Assessment & Plan:   Problem List Items Addressed This Visit    None    Visit Diagnoses    Hand arthritis    -  Primary   Relevant Medications   aspirin EC 81 MG tablet   predniSONE (STERAPRED UNI-PAK 48 TAB) 5 MG (48) TBPK tablet   Encounter for medication monitoring         Advised to stop the meloxicam while on prednisone to decrease the risk of GI bleeding Will get records from [place] Will plan to follow up to see what the final diagnosis is Advised pt on risks and benefits of steroids   Concern about covid Pt advised that if she develops symptoms while on prednisone to call the Rheumatologist to discuss further or to call here  No orders of the defined types  were placed in this encounter.   Follow-up: No follow-ups on file.    Forrest Moron, MD

## 2018-12-25 DIAGNOSIS — Z6824 Body mass index (BMI) 24.0-24.9, adult: Secondary | ICD-10-CM | POA: Diagnosis not present

## 2018-12-25 DIAGNOSIS — M0579 Rheumatoid arthritis with rheumatoid factor of multiple sites without organ or systems involvement: Secondary | ICD-10-CM | POA: Diagnosis not present

## 2018-12-25 DIAGNOSIS — M2550 Pain in unspecified joint: Secondary | ICD-10-CM | POA: Diagnosis not present

## 2018-12-25 DIAGNOSIS — R5382 Chronic fatigue, unspecified: Secondary | ICD-10-CM | POA: Diagnosis not present

## 2018-12-25 DIAGNOSIS — M79642 Pain in left hand: Secondary | ICD-10-CM | POA: Diagnosis not present

## 2018-12-25 DIAGNOSIS — M79641 Pain in right hand: Secondary | ICD-10-CM | POA: Diagnosis not present

## 2018-12-28 ENCOUNTER — Encounter: Payer: Self-pay | Admitting: Family Medicine

## 2018-12-31 ENCOUNTER — Encounter: Payer: Self-pay | Admitting: Family Medicine

## 2019-01-05 DIAGNOSIS — M25532 Pain in left wrist: Secondary | ICD-10-CM | POA: Diagnosis not present

## 2019-01-05 DIAGNOSIS — M25531 Pain in right wrist: Secondary | ICD-10-CM | POA: Diagnosis not present

## 2019-02-05 DIAGNOSIS — R5382 Chronic fatigue, unspecified: Secondary | ICD-10-CM | POA: Diagnosis not present

## 2019-02-05 DIAGNOSIS — M79642 Pain in left hand: Secondary | ICD-10-CM | POA: Diagnosis not present

## 2019-02-05 DIAGNOSIS — M0579 Rheumatoid arthritis with rheumatoid factor of multiple sites without organ or systems involvement: Secondary | ICD-10-CM | POA: Diagnosis not present

## 2019-02-05 DIAGNOSIS — Z6824 Body mass index (BMI) 24.0-24.9, adult: Secondary | ICD-10-CM | POA: Diagnosis not present

## 2019-02-05 DIAGNOSIS — M2550 Pain in unspecified joint: Secondary | ICD-10-CM | POA: Diagnosis not present

## 2019-02-05 DIAGNOSIS — M79641 Pain in right hand: Secondary | ICD-10-CM | POA: Diagnosis not present

## 2019-03-12 DIAGNOSIS — Z03818 Encounter for observation for suspected exposure to other biological agents ruled out: Secondary | ICD-10-CM | POA: Diagnosis not present

## 2019-03-23 DIAGNOSIS — Z6824 Body mass index (BMI) 24.0-24.9, adult: Secondary | ICD-10-CM | POA: Diagnosis not present

## 2019-03-23 DIAGNOSIS — M79642 Pain in left hand: Secondary | ICD-10-CM | POA: Diagnosis not present

## 2019-03-23 DIAGNOSIS — M0579 Rheumatoid arthritis with rheumatoid factor of multiple sites without organ or systems involvement: Secondary | ICD-10-CM | POA: Diagnosis not present

## 2019-03-23 DIAGNOSIS — Z9229 Personal history of other drug therapy: Secondary | ICD-10-CM | POA: Diagnosis not present

## 2019-03-23 DIAGNOSIS — Z79899 Other long term (current) drug therapy: Secondary | ICD-10-CM | POA: Diagnosis not present

## 2019-03-23 DIAGNOSIS — R5382 Chronic fatigue, unspecified: Secondary | ICD-10-CM | POA: Diagnosis not present

## 2019-03-23 DIAGNOSIS — M79641 Pain in right hand: Secondary | ICD-10-CM | POA: Diagnosis not present

## 2019-03-23 DIAGNOSIS — M2550 Pain in unspecified joint: Secondary | ICD-10-CM | POA: Diagnosis not present

## 2019-04-20 DIAGNOSIS — M0579 Rheumatoid arthritis with rheumatoid factor of multiple sites without organ or systems involvement: Secondary | ICD-10-CM | POA: Diagnosis not present

## 2019-04-20 DIAGNOSIS — Z23 Encounter for immunization: Secondary | ICD-10-CM | POA: Diagnosis not present

## 2019-04-20 DIAGNOSIS — R5382 Chronic fatigue, unspecified: Secondary | ICD-10-CM | POA: Diagnosis not present

## 2019-04-20 DIAGNOSIS — M79642 Pain in left hand: Secondary | ICD-10-CM | POA: Diagnosis not present

## 2019-04-20 DIAGNOSIS — M79641 Pain in right hand: Secondary | ICD-10-CM | POA: Diagnosis not present

## 2019-04-20 DIAGNOSIS — M2550 Pain in unspecified joint: Secondary | ICD-10-CM | POA: Diagnosis not present

## 2019-04-20 DIAGNOSIS — Z6824 Body mass index (BMI) 24.0-24.9, adult: Secondary | ICD-10-CM | POA: Diagnosis not present

## 2019-04-29 DIAGNOSIS — Z03818 Encounter for observation for suspected exposure to other biological agents ruled out: Secondary | ICD-10-CM | POA: Diagnosis not present

## 2019-04-30 DIAGNOSIS — M0579 Rheumatoid arthritis with rheumatoid factor of multiple sites without organ or systems involvement: Secondary | ICD-10-CM | POA: Diagnosis not present

## 2019-05-06 ENCOUNTER — Encounter: Payer: Self-pay | Admitting: Family Medicine

## 2019-05-14 ENCOUNTER — Ambulatory Visit (INDEPENDENT_AMBULATORY_CARE_PROVIDER_SITE_OTHER): Payer: Medicare Other | Admitting: Family Medicine

## 2019-05-14 ENCOUNTER — Ambulatory Visit
Admission: RE | Admit: 2019-05-14 | Discharge: 2019-05-14 | Disposition: A | Discharge status: Home / Self Care | Payer: Medicare Other | Source: Ambulatory Visit | Attending: Family Medicine | Admitting: Family Medicine

## 2019-05-14 ENCOUNTER — Encounter: Payer: Self-pay | Admitting: Family Medicine

## 2019-05-14 ENCOUNTER — Other Ambulatory Visit: Payer: Self-pay

## 2019-05-14 VITALS — BP 109/69 | HR 87 | Temp 98.4°F | Resp 14 | Ht 65.0 in | Wt 144.0 lb

## 2019-05-14 DIAGNOSIS — J189 Pneumonia, unspecified organism: Secondary | ICD-10-CM

## 2019-05-14 DIAGNOSIS — Z8249 Family history of ischemic heart disease and other diseases of the circulatory system: Secondary | ICD-10-CM | POA: Diagnosis not present

## 2019-05-14 DIAGNOSIS — Z8042 Family history of malignant neoplasm of prostate: Secondary | ICD-10-CM

## 2019-05-14 DIAGNOSIS — R7301 Impaired fasting glucose: Secondary | ICD-10-CM | POA: Diagnosis not present

## 2019-05-14 DIAGNOSIS — R0989 Other specified symptoms and signs involving the circulatory and respiratory systems: Secondary | ICD-10-CM

## 2019-05-14 DIAGNOSIS — Z0000 Encounter for general adult medical examination without abnormal findings: Secondary | ICD-10-CM

## 2019-05-14 DIAGNOSIS — Z125 Encounter for screening for malignant neoplasm of prostate: Secondary | ICD-10-CM

## 2019-05-14 DIAGNOSIS — E7800 Pure hypercholesterolemia, unspecified: Secondary | ICD-10-CM

## 2019-05-14 DIAGNOSIS — Z0001 Encounter for general adult medical examination with abnormal findings: Secondary | ICD-10-CM | POA: Diagnosis not present

## 2019-05-14 DIAGNOSIS — R05 Cough: Secondary | ICD-10-CM | POA: Diagnosis not present

## 2019-05-14 MED ORDER — ATORVASTATIN CALCIUM 20 MG PO TABS: 20.0000 mg | ORAL_TABLET | Freq: Every day | ORAL | 3 refills | Status: DC

## 2019-05-14 MED ORDER — MELOXICAM 7.5 MG PO TABS: 7.5000 mg | ORAL_TABLET | Freq: Every day | ORAL | 3 refills | Status: DC | PRN

## 2019-05-14 NOTE — Progress Notes (Signed)
QUICK REFERENCE INFORMATION: The ABCs of Providing the Annual Wellness Visit  CMS.gov Medicare Learning Network  [employer] Annual Wellness Visit  Subjective:   Earl Thomas is a 68 y.o. Male who presents for an Annual Wellness Visit.  Patient Active Problem List   Diagnosis Date Noted  . Bilateral wrist pain 07/28/2018  . Pain in joint of left shoulder 03/04/2018  . Seasonal allergic rhinitis due to pollen 10/22/2016  . BMI 26.0-26.9,adult 10/22/2016  . Renal insufficiency 04/29/2016  . Glucose intolerance (impaired glucose tolerance) 04/29/2016  . Hyperlipidemia 03/10/2012    Past Medical History:  Diagnosis Date  . Arthritis   . Glucose intolerance (impaired glucose tolerance)   . Hyperlipidemia   . 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     Past Surgical History:  Procedure Laterality Date  . ROTATOR CUFF REPAIR Right 01/02/16     Outpatient Medications Prior to Visit  Medication Sig Dispense Refill  . Methotrexate Sodium (METHOTREXATE, PF,) 200 MG/8ML injection Inject 200 mg into the muscle once a week. Weekly injection at Rheumatology    . aspirin EC 81 MG tablet Take 81 mg by mouth daily.    Marland Kitchen azelastine (ASTELIN) 0.1 % nasal spray Place 2 sprays into both nostrils 2 (two) times daily. Use in each nostril as directed 30 mL 5  . diclofenac sodium (VOLTAREN) 1 % GEL Apply 2 g topically 4 (four) times daily. 100 g 1  . hydrocortisone (PROCTOSOL HC) 2.5 % rectal cream APPLY ONE APPLICATORFUL RECTALLY TWICE DAILY as needed for hemorrhoid pain/itching/bleeding 29 g 3  . methotrexate (50 MG/ML) 1 g injection Inject into the vein once.    . predniSONE (STERAPRED UNI-PAK 48 TAB) 5 MG (48) TBPK tablet     . tadalafil (ADCIRCA/CIALIS) 20 MG tablet Take 0.5 tablets (10 mg total) by mouth every other day as needed for erectile dysfunction. 5 tablet 3  . tadalafil (CIALIS) 20 MG tablet Take 20 mg by mouth daily.    Marland Kitchen aspirin 325 MG tablet Take 325  mg by mouth daily.    Marland Kitchen atorvastatin (LIPITOR) 20 MG tablet Take 1 tablet (20 mg total) by mouth daily at 6 PM. 90 tablet 3  . meloxicam (MOBIC) 7.5 MG tablet Take 1 tablet (7.5 mg total) by mouth daily. 30 tablet 0   No facility-administered medications prior to visit.     No Known Allergies   Family History  Problem Relation Age of Onset  . Cancer Father 72       prostate cancer  . Diabetes Father   . Heart disease Father 69       AMI late 68s  . Cancer Mother 63       Breast cancer  . Heart disease Mother        CABG at age 47  . Diabetes Brother   . Heart disease Brother        AMI x 2; CABG  . Hyperlipidemia Brother      Social History   Socioeconomic History  . Marital status: Married    Spouse name: Not on file  . Number of children: 2  . Years of education: Not on file  . Highest education level: Not on file  Occupational History  . Occupation: Professor  Social Needs  . Financial resource strain: Not on file  . Food insecurity    Worry: Not on file    Inability: Not on file  . Transportation needs    Medical:  Not on file    Non-medical: Not on file  Tobacco Use  . Smoking status: Never Smoker  . Smokeless tobacco: Never Used  Substance and Sexual Activity  . Alcohol use: No  . Drug use: No  . Sexual activity: Yes  Lifestyle  . Physical activity    Days per week: Not on file    Minutes per session: Not on file  . Stress: Not on file  Relationships  . Social Herbalist on phone: Not on file    Gets together: Not on file    Attends religious service: Not on file    Active member of club or organization: Not on file    Attends meetings of clubs or organizations: Not on file    Relationship status: Not on file  Other Topics Concern  . 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      Tobacco: pipe in 1980s      Alcohol: none      Exercise: joined [employer]; going 2-3 times per week.        Seatbelt:  100%      Recent Hospitalizations? No  Health Habits: Current exercise activities include: none Exercise: 0 times/week. Diet: in general, a "healthy" diet     Health Risk Assessment: The patient has completed a Health Risk Assessment. This has been reveiwed with them and has been scanned into the [facility] system as an attached document.  Current Medical Providers and Suppliers: [facility] Patient Care Team: Forrest Moron, MD as PCP - General (Internal Medicine) Carol Ada, MD as Consulting Physician (Gastroenterology) No future appointments.  Age-appropriate Screening Schedule: The list below includes current immunization status and future screening recommendations based on patient's age. Orders for these recommended tests are listed in the plan section. The patient has been provided with a written plan. Immunization History  Administered Date(s) Administered  . Influenza,inj,Quad PF,6+ Mos 05/30/2015, 04/23/2016, 05/14/2017, 03/25/2018  . Influenza-Unspecified 04/07/2014  . Pneumococcal Conjugate-13 05/18/2018  . Tdap 02/13/2009, 05/14/2017  . Zoster 04/12/2014  . Zoster Recombinat (Shingrix) 05/27/2018    Health Maintenance reviewed -  Immunizations up to date   Depression Screen-PHQ2/9 completed today  Depression screen [facility] 2/9 05/14/2019 12/11/2018 09/18/2018 05/18/2018 11/12/2017  Decreased Interest 0 0 0 0 0  Down, Depressed, Hopeless 0 0 0 0 0  PHQ - 2 Score 0 0 0 0 0       Depression Severity and Treatment Recommendations:  0-4= None  5-9= Mild / Treatment: Support, educate to call if worse; return in one month  10-14= Moderate / Treatment: Support, watchful waiting; Antidepressant or Psycotherapy  15-19=  Moderately severe / Treatment: Antidepressant OR Psychotherapy  >= 20 = Major depression, severe / Antidepressant AND Psychotherapy  Functional Status Survey:   Functional Status Survey: Is the patient deaf or have difficulty hearing?: No Does the patient have difficulty seeing, even when wearing glasses/contacts?: No Does the patient have difficulty concentrating, remembering, or making decisions?: No Does the patient have difficulty walking or climbing stairs?: No Does the patient have difficulty dressing or bathing?: No Does the patient have difficulty doing errands alone such as visiting a doctor's  office or shopping?: No    Advanced Care Planning: 1. Patient has executed an Advance Directive: No, he has the paperwork and plans to fill it out.    Cognitive Assessment: Does the patient have evidence of cognitive impairment? No The patient does not have any evidence of any cognitive problems and denies any  change in mood/affect, appearance, speech, memory or motor skills.  Identification of Risk Factors: Risk factors include: hyperlipidemia  ROS Review of Systems  Constitutional: Negative for activity change, appetite change, chills and fever.  HENT: Negative for congestion, nosebleeds, trouble swallowing and voice change.   Respiratory: + shortness of breath but wheezing.  His shortness of breath is like a hiccup and sometimes there is a cough with it. On going for a few months.  Gastrointestinal: Negative for diarrhea, nausea and vomiting.  Genitourinary: Negative for difficulty urinating, dysuria, flank pain and hematuria.  Musculoskeletal: Negative for back pain, joint swelling and neck pain.  Neurological: Negative for dizziness, speech difficulty, light-headedness and numbness.  See HPI. All other review of systems negative.   Objective:   Vitals:   05/14/19 0800  BP: 109/69  Pulse: 87  Resp: 14  Temp: 98.4 F (36.9 C)  SpO2: 98%  Weight: 144 lb (65.3 kg)   Height: _0  (1.651 m)    Body mass index is 23.96 kg/m.  BP 109/69 (BP Location: Right Arm, Patient Position: Sitting, Cuff Size: Normal)   Pulse 87   Temp 98.4 F (36.9 C)   Resp 14   Ht _1  (1.651 m)   Wt 144 lb (65.3 kg)   SpO2 98%   BMI 23.96 kg/m   General Appearance:    Alert, cooperative, no distress, appears stated age  Head:    Normocephalic, without obvious abnormality, atraumatic  Eyes:    PERRL, conjunctiva/corneas clear, EOM's intact  Ears:    external ear canals, both ears  Throat:   Lips, mucosa, and tongue normal; teeth and gums normal  Neck:   Supple, symmetrical, trachea midline, no adenopathy;    thyroid:  no enlargement/tenderness/nodules  Back:     Symmetric, no curvature, ROM normal, no CVA tenderness  Lungs:     Clear to auscultation in the anterior and superior lung fields, bilateral lung bases with crackles (sounds like velcro being ripped apart), respirations unlabored  Chest Wall:    No tenderness or deformity   Heart:    Regular rate and rhythm, S1 and S2 normal, no murmur, rub   or gallop  Abdomen:     Soft, non-tender, bowel sounds active all four quadrants,    no masses, no organomegaly  Extremities:   Extremities normal, atraumatic, no cyanosis or edema  Pulses:   2+ and symmetric all extremities  Skin:   Skin color, texture, turgor normal, no rashes or lesions  Lymph nodes:   Cervical, supraclavicular, and axillary nodes normal  Neurologic:   CNII-XII intact, normal strength, sensation and reflexes    throughout      Assessment/Plan:   Patient Self-Management and Personalized Health Advice The patient has been provided with information about:  begin progressive daily aerobic exercise program  During the course of the visit the patient was educated and counseled about appropriate screening and preventive services including:   lab testing as noted in orders section     Body mass index is 23.96 kg/m. Discussed the patient's BMI  with him. The BMI BMI is in the acceptable range  Braydn was seen today  for annual exam.  Diagnoses and all orders for this visit:  Medicare annual wellness visit, subsequent -  Discussed age appropriate screenings  Pure hypercholesterolemia -     CMP14+EGFR -     Lipid panel -     atorvastatin (LIPITOR) 20 MG tablet; Take 1 tablet (20 mg total) by mouth daily at 6 PM.  Family history of early CAD  Family history of prostate cancer in father -     PSA  Screening for prostate cancer -     PSA  Impaired fasting glucose -     Hemoglobin A1c  Respiratory crackles at both lung bases- will assess as pt is having shortness of breath He has risk factors of Rheumatoid Arthritis on Methotrexate and folic acid Sees Dr. Annamaria Boots -     DG Chest 2 View; Future  Other orders -     meloxicam (MOBIC) 7.5 MG tablet; Take 1 tablet (7.5 mg total) by mouth daily as needed for pain.      Return in about 4 weeks (around 06/11/2019) for lung evaluation .  No future appointments.  Patient Instructions     ([facility]) - [phone] or [phone]   If you have lab work done today you will be contacted with your lab results within the next 2 weeks.  If you have not heard from [place] then please contact us. The fastest way to get your results is to register for My Chart.   IF you received an x-ray today, you will receive an invoice from [facility]. Please contact [facility] at [phone] with questions or concerns regarding your invoice.   IF you received labwork today, you will receive an invoice from [facility]. Please contact [facility] at [phone] with questions or concerns regarding your invoice.   Our billing staff will not be able to assist you with questions regarding bills from these companies.  You will be contacted with the lab results as soon as they are available. The fastest way to get your results is to activate your My Chart  account. Instructions are located on the last page of this paperwork. If you have not heard from [place] regarding the results in 2 weeks, please contact this office.        An after visit summary with all of these plans was given to the patient.

## 2019-05-14 NOTE — Patient Instructions (Addendum)
(  [facility]) - [phone] or [phone]  [address]   If you have lab work done today you will be contacted with your lab results within the next 2 weeks.  If you have not heard from [place] then please contact us. The fastest way to get your results is to register for My Chart.   IF you received an x-ray today, you will receive an invoice from [facility]. Please contact [facility] at [phone] with questions or concerns regarding your invoice.   IF you received labwork today, you will receive an invoice from [facility]. Please contact [facility] at [phone] with questions or concerns regarding your invoice.   Our billing staff will not be able to assist you with questions regarding bills from these companies.  You will be contacted with the lab results as soon as they are available. The fastest way to get your results is to activate your My Chart account. Instructions are located on the last page of this paperwork. If you have not heard from [place] regarding the results in 2 weeks, please contact this office.

## 2019-05-15 LAB — CMP14+EGFR
ALT: 19 IU/L (ref 0–44)
AST: 18 IU/L (ref 0–40)
Albumin/Globulin Ratio: 1.4 (ref 1.2–2.2)
Albumin: 4 g/dL (ref 3.8–4.8)
Alkaline Phosphatase: 48 IU/L (ref 39–117)
BUN/Creatinine Ratio: 11 (ref 10–24)
BUN: 12 mg/dL (ref 8–27)
Bilirubin Total: 0.5 mg/dL (ref 0.0–1.2)
CO2: 21 mmol/L (ref 20–29)
Calcium: 9.2 mg/dL (ref 8.6–10.2)
Chloride: 103 mmol/L (ref 96–106)
Creatinine, Ser: 1.08 mg/dL (ref 0.76–1.27)
GFR calc Af Amer: 82 mL/min/{1.73_m2} (ref >59)
GFR calc non Af Amer: 71 mL/min/{1.73_m2} (ref >59)
Globulin, Total: 2.8 g/dL (ref 1.5–4.5)
Glucose: 90 mg/dL (ref 65–99)
Potassium: 4 mmol/L (ref 3.5–5.2)
Sodium: 141 mmol/L (ref 134–144)
Total Protein: 6.8 g/dL (ref 6.0–8.5)

## 2019-05-15 LAB — LIPID PANEL
Chol/HDL Ratio: 6.3 ratio — ABNORMAL HIGH (ref 0.0–5.0)
Cholesterol, Total: 246 mg/dL — ABNORMAL HIGH (ref 100–199)
HDL: 39 mg/dL — ABNORMAL LOW (ref >39)
LDL Chol Calc (NIH): 193 mg/dL — ABNORMAL HIGH (ref 0–99)
Triglycerides: 82 mg/dL (ref 0–149)
VLDL Cholesterol Cal: 14 mg/dL (ref 5–40)

## 2019-05-15 LAB — HEMOGLOBIN A1C
Est. average glucose Bld gHb Est-mCnc: 131 mg/dL
Hgb A1c MFr Bld: 6.2 % — ABNORMAL HIGH (ref 4.8–5.6)

## 2019-05-15 LAB — PSA: Prostate Specific Ag, Serum: 0.6 ng/mL (ref 0.0–4.0)

## 2019-05-18 ENCOUNTER — Telehealth: Payer: Self-pay | Admitting: Family Medicine

## 2019-05-18 MED ORDER — AZITHROMYCIN 250 MG PO TABS: ORAL_TABLET | ORAL | 0 refills | Status: DC

## 2019-05-18 MED ORDER — PREDNISONE 20 MG PO TABS: 40.0000 mg | ORAL_TABLET | Freq: Every day | ORAL | 0 refills | Status: AC

## 2019-05-18 MED ORDER — FAMOTIDINE 20 MG PO TABS: 20.0000 mg | ORAL_TABLET | Freq: Two times a day (BID) | ORAL | 0 refills | Status: DC

## 2019-05-18 NOTE — Progress Notes (Signed)
Patient update  cxr shows bilateral pneumonitis.  Called pt and he has not history of aspiration but is hiccupping and coughing He is on methotrexate for RA He has no fevers but due to immunosuppressant he may not have a fever To be cautious pt was advised to pick up steroid burst, also advised azithromycin for atypicals and started on H2 Blocker for GERD He should follow up with Pulmonology Referral has been placed.

## 2019-05-18 NOTE — Telephone Encounter (Signed)
Notified patient of pneumonitis Placed referral and advised pt to follow up with Pulmonology Prescribed azithromycin, steroid and pepcid (for acid reflux and hiccups)

## 2019-05-18 NOTE — Addendum Note (Signed)
Addended by: Delia Chimes A on: 05/18/2019 05:50 PM   Modules accepted: Orders

## 2019-05-25 ENCOUNTER — Other Ambulatory Visit: Payer: Self-pay

## 2019-05-25 ENCOUNTER — Ambulatory Visit (INDEPENDENT_AMBULATORY_CARE_PROVIDER_SITE_OTHER): Payer: Medicare Other | Admitting: Internal Medicine

## 2019-05-25 ENCOUNTER — Encounter: Payer: Self-pay | Admitting: Internal Medicine

## 2019-05-25 VITALS — BP 116/72 | HR 86 | Temp 98.4°F | Ht 65.0 in | Wt 147.8 lb

## 2019-05-25 DIAGNOSIS — J849 Interstitial pulmonary disease, unspecified: Secondary | ICD-10-CM

## 2019-05-25 DIAGNOSIS — M0579 Rheumatoid arthritis with rheumatoid factor of multiple sites without organ or systems involvement: Secondary | ICD-10-CM

## 2019-05-25 NOTE — Progress Notes (Addendum)
Earl Thomas    [id number]    12-03-1950  Primary Care Physician:Thomas, Earl Solomons, MD  Referring Physician: Forrest Moron, MD [name],  [name] [address] Reason for Consultation: Pneumonitis Date of Consultation: 05/25/2019  Chief complaint:   Chief Complaint  Patient presents with  . Consult    Referred by Dr. Nolon Thomas for pneumonitis.  pt states "lungs sound like velcro", nonprod cough X6 mos.       HPI: Earlier this year he was diagnosed with rheumatoid arthritis, and started on prednisone 37m (which he finished yesterday.) and methotrexate.  He is also on Golimumab infusions for his RA - which he takes infusions every 8 weeks after the loading infusions. 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   He does have dyspnea on exertion - which has been coming on over the last 6 months.  Doing simple chores around the house take more rest now than 6 months ago.   He has had a couple of episodes of bronchitis - last episode about 4 years ago.   ILD REVIEW OF SYSTEMS - his elementary school growing up did have asbestos  - He is also a member of the clergy, but never did any industrial or factory work.  No birds or chickens - 2 dogs that he occasional watches.  Current home - since 2006.  No water damage or mold exposure in home No hot tub at home He does have arthralgias in his bilateral upper extremities and knees Denies skin rash or lesions Denies nail changes or finger splitting Denies Raynaud's Denies dry eyes or dry mouth Denies dysphagia or heartburn PPD negative and no h/o exposure No h/o chemo/XRT/amiodarone/macrodantin/MTX [place]  Travel history - [place] the last two years.   Denies lightheadedness, syncope or h/o seizure Denies vision  changes Denies atopy or sinusitis Denies excessive thirst or urination. No h/o DM Denies wt change or heat or cold intolerance. No h/o thyroid disorder Denies CP, edema or palpitations Regular BMs with no hematochezia or melena No hematuria, kidney stones or known renal disease  Social history:  Social History   Occupational History  . Occupation: Professor  Tobacco Use  . Smoking status: Passive Smoke Exposure - Never Smoker  . Smokeless tobacco: Never Used  . Tobacco comment: parent smoked in home as a child  Substance and Sexual Activity  . Alcohol use: No  . Drug use: No  . Sexual activity: Yes    Relevant family history: No known history of lung disease Mother - RA Family History  Problem Relation Age of Onset  . Cancer Father 866      prostate cancer  . Diabetes Father   . Heart disease Father 777      AMI late 757s . Cancer Mother 858      Breast cancer  . Heart disease Mother        CABG at age 66 . Diabetes Brother   . Heart disease Brother        AMI x 2; CABG  . Hyperlipidemia Brother     Past Medical History:  Diagnosis Date  . Arthritis   . Glucose intolerance (impaired glucose tolerance)   . Hyperlipidemia   . Pituitary macroadenoma (  [place]) 10/06/2006   s/p NS consultation/[facility].    Past Surgical History:  Procedure Laterality Date  . ROTATOR CUFF REPAIR Right 01/02/16    Review of systems: Constitutional: weight loss HENT: Negative.   Eyes: Negative for blurred vision.  Respiratory: as per HPI  Cardiovascular: Negative for chest pain and palpitations.  Gastrointestinal: Negative for vomiting, diarrhea, blood per rectum. Genitourinary: Negative for dysuria, urgency, frequency and hematuria.  Musculoskeletal: arthralgias Skin: Negative for itching and rash.  Neurological: Negative for dizziness, tremors, focal weakness, seizures and loss of consciousness.  Endo/Heme/Allergies: Negative for environmental allergies.   Psychiatric/Behavioral: Negative for depression, suicidal ideas and hallucinations.  All other systems reviewed and are negative.  Physical Exam: Blood pressure 116/72, pulse 86, temperature 98.4 F (36.9 C), temperature source Temporal, height _0  (1.651 m), weight 147 lb 12.8 oz (67 kg), SpO2 97 %. Gen:      No acute distress Eyes: EOMI, sclera anicteric ENT:  no nasal polyps, mucus membranes moist Neck:     Supple, no thyromegaly Lungs:    No increased respiratory effort, symmetric chest wall excursion, there are fine bibasilar crackles CV:         Regular rate and rhythm; no murmurs, rubs, or gallops.  No pedal edema Abd:      + bowel sounds; soft, non-tender; no distension MSK: no acute synovitis of DIP or PIP joints, no mechanics hands.  Skin:      Warm and dry; no rashes Neuro: normal speech, no focal facial asymmetry Psych: alert and oriented x3, normal mood and affect  Data Reviewed: Imaging: I have personally reviewed the x-ray completed on May 14, 2019 which demonstrates bilateral interstitial opacities with architectural distortion concerning for interstitial lung disease.  PFTs: No PFTs on file  Labs: I have reviewed his outside hospital labs noted on September 15 progress note, his CCP level was greater than 255 factor was 149 his ESR was 109 his uric acid was normal Lab Results  Component Value Date   WBC 4.8 11/12/2017   HGB 14.8 11/12/2017   HCT 45.3 11/12/2017   MCV 86 11/12/2017   PLT 197 11/12/2017   Lab Results  Component Value Date   NA 141 05/14/2019   K 4.0 05/14/2019   CL 103 05/14/2019   CO2 21 05/14/2019    Immunization status: Immunization History  Administered Date(s) Administered  . Influenza, High Dose Seasonal PF 04/24/2019  . Influenza,inj,Quad PF,6+ Mos 05/30/2015, 04/23/2016, 05/14/2017, 03/25/2018  . Influenza-Unspecified 04/07/2014  . Pneumococcal Conjugate-13 05/18/2018  . Tdap 02/13/2009, 05/14/2017  . Zoster 04/12/2014   . Zoster Recombinat (Shingrix) 05/27/2018   up to date and documented.  Assessment:  Interstitial lung disease Rheumatoid arthritis  Plan/Recommendations: [name] has persistent symptoms of cough and dyspnea with an abnormal chest x-ray concerning for interstitial lung disease.  Next steps in work-up would be to obtain a CT chest noncontrast, as well as a full set of pulmonary function testing.  I would like to obtain a full set of autoimmune serologies that was drawn to [facility] rheumatology.  May did need additional testing pending results.  I will discuss his case in multidisciplinary ILD conference.  He is on a TNF alpha inhibitor which typically is not the favored treatment for RA related ILD, but as long as his joint symptoms are controlled on methotrexate and golimumab we can continue for now.  There is increasing evidence that methotrexate is not as harmful and RA ILD as previously thought.  Ever if his lung disease did occur while on methotrexate, azathioprine may be a better solution.  Depending on how acute or chronic his CT changes are, we may consider glucocorticoids versus nintedanib as well.  Return to Care: Return in about 8 weeks (around 07/20/2019).  Lenice Llamas, MD [facility] [place]  CC: Earl Moron, MD

## 2019-05-28 DIAGNOSIS — Z03818 Encounter for observation for suspected exposure to other biological agents ruled out: Secondary | ICD-10-CM | POA: Diagnosis not present

## 2019-05-28 DIAGNOSIS — Z79899 Other long term (current) drug therapy: Secondary | ICD-10-CM | POA: Diagnosis not present

## 2019-05-28 DIAGNOSIS — M0579 Rheumatoid arthritis with rheumatoid factor of multiple sites without organ or systems involvement: Secondary | ICD-10-CM | POA: Diagnosis not present

## 2019-06-02 ENCOUNTER — Other Ambulatory Visit: Payer: Self-pay

## 2019-06-02 ENCOUNTER — Ambulatory Visit (HOSPITAL_BASED_OUTPATIENT_CLINIC_OR_DEPARTMENT_OTHER)
Admission: RE | Admit: 2019-06-02 | Discharge: 2019-06-02 | Disposition: A | Discharge status: Home / Self Care | Payer: Medicare Other | Source: Ambulatory Visit | Attending: Internal Medicine | Admitting: Internal Medicine

## 2019-06-02 DIAGNOSIS — J849 Interstitial pulmonary disease, unspecified: Secondary | ICD-10-CM

## 2019-06-04 ENCOUNTER — Telehealth (INDEPENDENT_AMBULATORY_CARE_PROVIDER_SITE_OTHER): Payer: Medicare Other | Admitting: Internal Medicine

## 2019-06-04 NOTE — Telephone Encounter (Signed)
Called patient to notify him the results of his CT scan - UIP pattern consistent with RA-ILD.  Will discuss in ILD conference and await his PFTs.  His symptoms of breathing and cough are stable and unchanged.  He will continue the current therapy.

## 2019-06-09 ENCOUNTER — Encounter: Payer: Self-pay | Admitting: Family Medicine

## 2019-06-09 ENCOUNTER — Ambulatory Visit (INDEPENDENT_AMBULATORY_CARE_PROVIDER_SITE_OTHER): Payer: Medicare Other | Admitting: Family Medicine

## 2019-06-09 ENCOUNTER — Other Ambulatory Visit: Payer: Self-pay

## 2019-06-09 VITALS — BP 123/80 | HR 96 | Temp 98.8°F | Resp 17 | Ht 65.0 in | Wt 148.8 lb

## 2019-06-09 DIAGNOSIS — Z23 Encounter for immunization: Secondary | ICD-10-CM | POA: Diagnosis not present

## 2019-06-09 DIAGNOSIS — R7302 Impaired glucose tolerance (oral): Secondary | ICD-10-CM | POA: Diagnosis not present

## 2019-06-09 DIAGNOSIS — M0510 Rheumatoid lung disease with rheumatoid arthritis of unspecified site: Secondary | ICD-10-CM

## 2019-06-09 MED ORDER — ALBUTEROL SULFATE HFA 108 (90 BASE) MCG/ACT IN AERS: 2.0000 | INHALATION_SPRAY | Freq: Four times a day (QID) | RESPIRATORY_TRACT | 1 refills | Status: DC | PRN

## 2019-06-09 NOTE — Progress Notes (Signed)
Established Patient Office Visit  Subjective:  Patient ID: Earl Thomas, male    DOB: 04-Nov-1950  Age: 68 y.o. MRN: [id number]  CC:  Chief Complaint  Patient presents with  . 4 week f/u lung evaluation    HPI [name] presents for   Patient is following up for his crackles in the lung  He is on Simponi Aria infusions every 8 weeks for his RA He mentioned during his wellness visit that he had been coughing and his exam showed significant crackles so a CXR was performed which was abnormal (see below)   FINDINGS: Mediastinum hilar structures normal. Heart size normal. Diffuse bilateral pulmonary interstitial infiltrates noted most consistent pneumonitis. Small bilateral pleural effusions can not be excluded. No pneumothorax. No acute bony abnormality.  IMPRESSION: Diffuse bilateral pulmonary interstitial infiltrates noted. Findings most consistent pneumonitis.   Electronically Signed   By: Marcello Moores  Register   On: 05/14/2019 09:40  He was referred to PULMONOLOGY, Dr. 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 Arthritis   . Glucose intolerance (impaired glucose tolerance)   . Hyperlipidemia   . 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    Past Surgical History:  Procedure Laterality Date  . ROTATOR CUFF REPAIR Right 01/02/16    Family History  Problem Relation Age of Onset  . Cancer Father 81       prostate cancer  . Diabetes Father   . Heart disease Father 26       AMI late 41s  . Cancer Mother 76       Breast cancer  . Heart disease Mother        CABG at age 55  . Diabetes Brother   . Heart disease Brother        AMI x 2; CABG  . Hyperlipidemia Brother     Social History   Socioeconomic History  . Marital  status: Married    Spouse name: Not on file  . Number of children: 2  . Years of education: Not on file  . Highest education level: Not on file  Occupational History  . Occupation: Professor  Tobacco Use  . Smoking status: Passive Smoke Exposure - Never Smoker  . Smokeless tobacco: Never Used  . Tobacco comment: parent smoked in home as a child  Substance and Sexual Activity  . Alcohol use: No  . Drug use: No  . Sexual activity: Yes  Other Topics Concern  . 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      Tobacco: pipe in 1980s      Alcohol: none      Exercise: joined [employer]; going 2-3 times per week.        Seatbelt:  100%   Social Determinants of Health   Financial Resource Strain:   . Difficulty of Paying Living Expenses: Not on file  Food Insecurity:   . Worried About Estate manager/land agent  of Food in the Last Year: Not on file  . Ran Out of Food in the Last Year: Not on file  Transportation Needs:   . Lack of Transportation (Medical): Not on file  . Lack of Transportation (Non-Medical): Not on file  Physical Activity:   . Days of Exercise per Week: Not on file  . Minutes of Exercise per Session: Not on file  Stress:   . Feeling of Stress : Not on file  Social Connections:   . Frequency of Communication with Friends and Family: Not on file  . Frequency of Social Gatherings with Friends and Family: Not on file  . Attends Religious Services: Not on file  . Active Member of Clubs or Organizations: Not on file  . Attends Archivist Meetings: Not on file  . Marital Status: Not on file  Intimate Partner Violence:   . Fear of Current or Ex-Partner: Not on file  . Emotionally Abused: Not on  file  . Physically Abused: Not on file  . Sexually Abused: Not on file    Outpatient Medications Prior to Visit  Medication Sig Dispense Refill  . aspirin EC 81 MG tablet Take 81 mg by mouth daily.    Marland Kitchen atorvastatin (LIPITOR) 20 MG tablet Take 1 tablet (20 mg total) by mouth daily at 6 PM. 90 tablet 3  . azelastine (ASTELIN) 0.1 % nasal spray Place 2 sprays into both nostrils 2 (two) times daily. Use in each nostril as directed 30 mL 5  . diclofenac sodium (VOLTAREN) 1 % GEL Apply 2 g topically 4 (four) times daily. 100 g 1  . famotidine (PEPCID) 20 MG tablet Take 1 tablet (20 mg total) by mouth 2 (two) times daily. For cough and hiccupping. 60 tablet 0  . Golimumab (Castana ARIA IV) Inject into the vein. Every 8 weeks infusion    . hydrocortisone (PROCTOSOL HC) 2.5 % rectal cream APPLY ONE APPLICATORFUL RECTALLY TWICE DAILY as needed for hemorrhoid pain/itching/bleeding 29 g 3  . meloxicam (MOBIC) 7.5 MG tablet Take 1 tablet (7.5 mg total) by mouth daily as needed for pain. 30 tablet 3  . methotrexate (50 MG/ML) 1 g injection Inject into the vein once.    . Methotrexate Sodium (METHOTREXATE, PF,) 200 MG/8ML injection Inject 200 mg into the muscle once a week. Weekly injection at Rheumatology    . tadalafil (ADCIRCA/CIALIS) 20 MG tablet Take 0.5 tablets (10 mg total) by mouth every other day as needed for erectile dysfunction. 5 tablet 3  . tadalafil (CIALIS) 20 MG tablet Take 20 mg by mouth daily.     No facility-administered medications prior to visit.    No Known Allergies  ROS Review of Systems Review of Systems  Constitutional: Negative for activity change, appetite change, chills and fever.  HENT: Negative for congestion, nosebleeds, trouble swallowing and voice change.   Respiratory: Negative for cough, shortness of breath and wheezing.   Gastrointestinal: Negative for diarrhea, nausea and vomiting.  Genitourinary: Negative for difficulty urinating, dysuria, flank pain and  hematuria.  Musculoskeletal: Negative for back pain, joint swelling and neck pain.  Neurological: Negative for dizziness, speech difficulty, light-headedness and numbness.  See HPI. All other review of systems negative.     Objective:    Physical Exam  BP 123/80 (BP Location: Right Arm, Patient Position: Sitting, Cuff Size: Normal)   Pulse 96   Temp 98.8 F (37.1 C) (Oral)   Resp 17   Ht _0  (1.651 m)  Wt 148 lb 12.8 oz (67.5 kg)   SpO2 96%   BMI 24.76 kg/m  Wt Readings from Last 3 Encounters:  06/09/19 148 lb 12.8 oz (67.5 kg)  05/25/19 147 lb 12.8 oz (67 kg)  05/14/19 144 lb (65.3 kg)   Physical Exam  Constitutional: Oriented to person, place, and time. Appears well-developed and well-nourished.  HENT:  Head: Normocephalic and atraumatic.  Eyes: Conjunctivae and EOM are normal.  Cardiovascular: Normal rate, regular rhythm, normal heart sounds and intact distal pulses.  No murmur heard. Pulmonary/Chest: Effort normal and breath sounds normal. No stridor. No respiratory distress. Has no wheezes.  Neurological: Is alert and oriented to person, place, and time.  Skin: Skin is warm. Capillary refill takes less than 2 seconds.  Psychiatric: Has a normal mood and affect. Behavior is normal. Judgment and thought content normal.         CLINICAL DATA:  68 year old male with history of rheumatoid arthritis. Prior treatment with methotrexate. Evaluate for interstitial lung disease.  EXAM: CT CHEST WITHOUT CONTRAST  TECHNIQUE: Multidetector CT imaging of the chest was performed following the standard protocol without intravenous contrast. High resolution imaging of the lungs, as well as inspiratory and expiratory imaging, was performed.  COMPARISON:  No priors.  FINDINGS: Cardiovascular: Heart size is normal. There is no significant pericardial fluid, thickening or pericardial calcification. There is aortic atherosclerosis, as well as atherosclerosis of the  great vessels of the mediastinum and the coronary arteries, including calcified atherosclerotic plaque in the left anterior descending and right coronary arteries.  Mediastinum/Nodes: No pathologically enlarged mediastinal or hilar lymph nodes. Please note that accurate exclusion of hilar adenopathy is limited on noncontrast CT scans. Esophagus is unremarkable in appearance. No axillary lymphadenopathy.  Lungs/Pleura: High-resolution images demonstrate widespread areas of ground-glass attenuation, septal thickening, thickening of the peribronchovascular interstitium, cylindrical and varicose traction bronchiectasis, peripheral bronchiolectasis, and a few areas of scattered honeycombing. These findings have a definitive craniocaudal gradient. Inspiratory and expiratory imaging is unremarkable. No acute consolidative airspace disease. No pleural effusions. No definite suspicious appearing pulmonary nodules or masses are noted.  Upper Abdomen: Unremarkable.  Musculoskeletal: There are no aggressive appearing lytic or blastic lesions noted in the visualized portions of the skeleton.  IMPRESSION: 1. The appearance of the lungs is compatible with interstitial lung disease, with a spectrum of findings considered diagnostic of usual interstitial pneumonia (UIP) per current ATS guidelines. 2. Aortic atherosclerosis, in addition to 2 vessel coronary artery disease. Please note that although the presence of coronary artery calcium documents the presence of coronary artery disease, the severity of this disease and any potential stenosis cannot be assessed on this non-gated CT examination. Assessment for potential risk factor modification, dietary therapy or pharmacologic therapy may be warranted, if clinically indicated.  Aortic Atherosclerosis (ICD10-I70.0).   Electronically Signed   By: Vinnie Langton M.D.   On: 06/02/2019 11:37   There are no preventive care reminders to  display for this patient.  There are no preventive care reminders to display for this patient.  Lab Results  Component Value Date   TSH 2.230 05/14/2017   Lab Results  Component Value Date   WBC 4.8 11/12/2017   HGB 14.8 11/12/2017   HCT 45.3 11/12/2017   MCV 86 11/12/2017   PLT 197 11/12/2017   Lab Results  Component Value Date   NA 141 05/14/2019   K 4.0 05/14/2019   CO2 21 05/14/2019   GLUCOSE 90 05/14/2019   BUN 12 05/14/2019  CREATININE 1.08 05/14/2019   BILITOT 0.5 05/14/2019   ALKPHOS 48 05/14/2019   AST 18 05/14/2019   ALT 19 05/14/2019   PROT 6.8 05/14/2019   ALBUMIN 4.0 05/14/2019   CALCIUM 9.2 05/14/2019   Lab Results  Component Value Date   CHOL 246 (H) 05/14/2019   Lab Results  Component Value Date   HDL 39 (L) 05/14/2019   Lab Results  Component Value Date   LDLCALC 193 (H) 05/14/2019   Lab Results  Component Value Date   TRIG 82 05/14/2019   Lab Results  Component Value Date   CHOLHDL 6.3 (H) 05/14/2019   Lab Results  Component Value Date   HGBA1C 6.2 (H) 05/14/2019      Assessment & Plan:   Problem List Items Addressed This Visit      Endocrine   Glucose intolerance (impaired glucose tolerance)    Other Visit Diagnoses    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 albuterol (VENTOLIN HFA) 108 (90 Base) MCG/ACT inhaler    Sig: Inhale 2 puffs into the lungs every 6 (six) hours as needed for wheezing or shortness of breath.    Dispense:  18  g    Refill:  1    Follow-up: Return in about 3 months (around 09/07/2019) for PREDIABETES.    Forrest Moron, MD

## 2019-06-09 NOTE — Patient Instructions (Addendum)
Low Glycemic Foods (20-49)  Breakfast Cereals: All-Bran                All-Bran Fruit ' n Oats Fiber One               Oatmeal (not instant)  Oat bran Fruits and fruit juices: (Limit to 1-2 servings per day) Apples               Apricots (fresh & dried)  Blackberries            Blueberries Cherries                  Cranberries             Peaches                  Pears                       Plums                       Prunes Grapefruit                Raspberries            Strawberries           Tangerine Apple juice             Grapefruit juice Tomato juice  Beans and legumes (fresh-cooked): Black-eyed peas     Butter beans Chick peas              Lentils     Green beans           Lima beans               Kidney beans          Navy beans  Non-starchy vegetables: Asparagus, bok choy, broccoli, cabbage, cauliflower, celery, cucumber, greens, lettuce, mushrooms, peppers, tomatoes, okra, onions, snow peas, spinach, summer squash  Grains: Barley                                Bulgur Rye                                    Wild rice  Nuts and oils : Almonds         Peanuts     Sunflower seeds  Hazelnuts      Pecans          Walnuts Oils that are liquid at room temperature  Dairy, fish, and meat: Milk, skim                         Lowfat cheese Yogurt, lowfat, fruit sugar sweetened Lean red meat                      Fish  Skinless chicken & [place]    Shellfish Moderate Glycemic Foods (50-69)  Breakfast Cereals: Bran Buds                             Bran Chex Just Right  Mini-Wheats Special K         Swiss muesli  Fruits: Banana (under-ripe)             Dates Figs                                      Grapes Kiwi                                      Mango Oranges                               Raisins  Fruit Juices: Cranberry juice                    Orange juice  Beans and legumes: [place]-type baked beans Canned pinto, kidney,  or navy beans Green peas  Vegetables: Beets                         Raw Carrots  Sweet potato              Yam Corn on the cob  Breads: Pita (pocket) bread          Oat bran bread Pumpernickel bread           Rye bread Wheat bread, high fiber       Grains: Cornmeal                           Rice, brown   Rice, white                         Couscous Pasta: Macaroni                           Pizza  cheese Raviolimeat filled           Spaghetti, white        Nuts: Cashews                           Macadamia  Snacks: Chocolate                    Ice cream,lowfat  Muffin                               Popcorn High Glycemic Foods (70-100)   Breakfast Cereals: Cheerios                 Corn Chex Corn Flakes            Cream of Wheat Grape Nuts              Grape Nut Flakes Life                 Nutri-Grain       Puffed Rice               Puffed Wheat Rice Chex                 Rice Krispies Shredded Wheat  Team Total Fruits: Pineapple                 Watermelon Banana (over-ripe)  Beverages: Sodas, sweet tea, pineapple juice  Vegetables: Potato, baked, boiled, fried, mashed [place] fries Canned or frozen corn Cooked carrots Parsnips Winter squash  Breads: Most breads (white and whole grain) Bagels                     Bread sticks Bread stuffing          Kaiser roll Dinner rolls  Grains: Rice, instant          Tapioca, with milk  Candy and most cookies Snacks: Donuts                      Corn chips        Jelly beans                 Pretzels Pastries                             Restaurant and ethnic foods Most [place] food (sugar in stir fry or wok sauces) Teriyaki-style meats and vegetables     If you have lab work done today you will be contacted with your lab results within the next 2 weeks.  If you have not heard from [place] then please contact us. The fastest way to get your results is to register for My Chart.   IF you received an  x-ray today, you will receive an invoice from [facility]. Please contact [facility] at [phone] with questions or concerns regarding your invoice.   IF you received labwork today, you will receive an invoice from [place]. Please contact [facility] at [phone] with questions or concerns regarding your invoice.   Our billing staff will not be able to assist you with questions regarding bills from these companies.  You will be contacted with the lab results as soon as they are available. The fastest way to get your results is to activate your My Chart account. Instructions are located on the last page of this paperwork. If you have not heard from [place] regarding the results in 2 weeks, please contact this office.

## 2019-06-15 DIAGNOSIS — M79642 Pain in left hand: Secondary | ICD-10-CM | POA: Diagnosis not present

## 2019-06-15 DIAGNOSIS — M0579 Rheumatoid arthritis with rheumatoid factor of multiple sites without organ or systems involvement: Secondary | ICD-10-CM | POA: Diagnosis not present

## 2019-06-15 DIAGNOSIS — R5382 Chronic fatigue, unspecified: Secondary | ICD-10-CM | POA: Diagnosis not present

## 2019-06-15 DIAGNOSIS — Z23 Encounter for immunization: Secondary | ICD-10-CM | POA: Diagnosis not present

## 2019-06-15 DIAGNOSIS — M79641 Pain in right hand: Secondary | ICD-10-CM | POA: Diagnosis not present

## 2019-06-15 DIAGNOSIS — Z6824 Body mass index (BMI) 24.0-24.9, adult: Secondary | ICD-10-CM | POA: Diagnosis not present

## 2019-06-15 DIAGNOSIS — J84114 Acute interstitial pneumonitis: Secondary | ICD-10-CM | POA: Diagnosis not present

## 2019-06-15 DIAGNOSIS — M2550 Pain in unspecified joint: Secondary | ICD-10-CM | POA: Diagnosis not present

## 2019-07-03 ENCOUNTER — Encounter: Payer: Self-pay | Admitting: Family Medicine

## 2019-07-16 ENCOUNTER — Other Ambulatory Visit: Payer: Self-pay

## 2019-07-16 ENCOUNTER — Ambulatory Visit (INDEPENDENT_AMBULATORY_CARE_PROVIDER_SITE_OTHER): Payer: Medicare Other | Admitting: Adult Health Nurse Practitioner

## 2019-07-16 VITALS — BP 120/70 | HR 76 | Temp 98.5°F | Ht 65.0 in | Wt 139.8 lb

## 2019-07-16 DIAGNOSIS — M0510 Rheumatoid lung disease with rheumatoid arthritis of unspecified site: Secondary | ICD-10-CM | POA: Diagnosis not present

## 2019-07-16 DIAGNOSIS — Z79899 Other long term (current) drug therapy: Secondary | ICD-10-CM

## 2019-07-16 DIAGNOSIS — Z23 Encounter for immunization: Secondary | ICD-10-CM | POA: Insufficient documentation

## 2019-07-16 DIAGNOSIS — J849 Interstitial pulmonary disease, unspecified: Secondary | ICD-10-CM

## 2019-07-16 DIAGNOSIS — R55 Syncope and collapse: Secondary | ICD-10-CM | POA: Insufficient documentation

## 2019-07-16 HISTORY — DX: Interstitial pulmonary disease, unspecified: J84.9

## 2019-07-16 HISTORY — DX: Rheumatoid lung disease with rheumatoid arthritis of unspecified site: M05.10

## 2019-07-16 HISTORY — DX: Other long term (current) drug therapy: Z79.899

## 2019-07-16 MED ORDER — BUDESONIDE-FORMOTEROL FUMARATE 80-4.5 MCG/ACT IN AERO: 2.0000 | INHALATION_SPRAY | Freq: Two times a day (BID) | RESPIRATORY_TRACT | 3 refills | Status: DC

## 2019-07-16 NOTE — Patient Instructions (Addendum)
If you have lab work done today you will be contacted with your lab results within the next 2 weeks.  If you have not heard from [place] then please contact us. The fastest way to get your results is to register for My Chart.   IF you received an x-ray today, you will receive an invoice from [facility]. Please contact [facility] at [phone] with questions or concerns regarding your invoice.   IF you received labwork today, you will receive an invoice from [facility]. Please contact [facility] at [phone] with questions or concerns regarding your invoice.   Our billing staff will not be able to assist you with questions regarding bills from these companies.  You will be contacted with the lab results as soon as they are available. The fastest way to get your results is to activate your My Chart account. Instructions are located on the last page of this paperwork. If you have not heard from [place] regarding the results in 2 weeks, please contact this office.        If you have lab work done today you will be contacted with your lab results within the next 2 weeks.  If you have not heard from [place] then please contact us. The fastest way to get your results is to register for My Chart.   IF you received an x-ray today, you will receive an invoice from [facility] Rad[facility]. Please contact [facility] Radiology at [phone] with questions or concerns regarding your invoice.   IF you received labwork today, you will receive an invoice from [place]. Please contact [facility] at [phone] with questions or concerns regarding your invoice.   Our billing staff will not be able to assist you with questions regarding bills from these companies.  You will be contacted with the lab results as soon as they are available. The fastest way to get your results is to activate your My Chart account. Instructions are located on the last page of this paperwork. If you have not heard from [place]  regarding the results in 2 weeks, please contact this office.     Syncope  Syncope refers to a condition in which a person temporarily loses consciousness. Syncope may also be called fainting or passing out. It is caused by a sudden decrease in blood flow to the brain. Even though most causes of syncope are not dangerous, syncope can be a sign of a serious medical problem. Your health care provider may do tests to find the reason why you are having syncope. Signs that you may be about to faint include:  Feeling dizzy or light-headed.  Feeling nauseous.  Seeing all white or all black in your field of vision.  Having cold, clammy skin. If you faint, get medical help right away. Call your local emergency services (911 in the [place] Do not drive yourself to the hospital. Follow these instructions at home: Pay attention to any changes in your symptoms. Take these actions to stay safe and to help relieve your symptoms: Lifestyle  Do not drive, use machinery, or play sports until your health care provider says it is okay.  Do not drink alcohol.  Do not use any products that contain nicotine or tobacco, such as cigarettes and e-cigarettes. If you need help quitting, ask your health care provider.  Drink enough fluid to keep your urine pale yellow. General instructions  Take over-the-counter and prescription medicines only as told by your health care provider.  If you are taking  blood pressure or heart medicine, get up slowly and take several minutes to sit and then stand. This can reduce dizziness or light-headedness.  Have someone stay with you until you feel stable.  If you start to feel like you might faint, lie down right away and raise (elevate) your feet above the level of your heart. Breathe deeply and steadily. Wait until all the symptoms have passed.  Keep all follow-up visits as told by your health care provider. This is important. Get help right away if you:  Have a severe  headache.  Faint once or repeatedly.  Have pain in your chest, abdomen, or back.  Have a very fast or irregular heartbeat (palpitations).  Have pain when you breathe.  Are bleeding from your mouth or rectum, or you have black or tarry stool.  Have a seizure.  Are confused.  Have trouble walking.  Have severe weakness.  Have vision problems. These symptoms may represent a serious problem that is an emergency. Do not wait to see if your symptoms will go away. Get medical help right away. Call your local emergency services (911 in the [place] Do not drive yourself to the hospital. Summary  Syncope refers to a condition in which a person temporarily loses consciousness. It is caused by a sudden decrease in blood flow to the brain.  Signs that you may be about to faint include dizziness, feeling light-headed, feeling nauseous, sudden vision changes, or cold, clammy skin.  Although most causes of syncope are not dangerous, syncope can be a sign of a serious medical problem. If you faint, get medical help right away. This information is not intended to replace advice given to you by your health care provider. Make sure you discuss any questions you have with your health care provider. Document Revised: 06/06/2017 Document Reviewed: 06/02/2017 Elsevier Patient Education  2020 [employer].  Pulmonary Fibrosis  Pulmonary fibrosis is a type of lung disease that causes scarring. Over time, the scar tissue builds up in the air sacs of your lungs (alveoli). This makes it hard for you to breathe. Less oxygen can get into your blood. Scarring from pulmonary fibrosis gets worse over time. This damage is permanent and may lead to other serious health problems. What are the causes? There are many different causes of pulmonary fibrosis. Sometimes the cause is not known. This is called idiopathic pulmonary fibrosis. Other causes include:  Exposure to chemicals and substances found in  agricultural, farm, Architect, or factory work. These include mold, asbestos, silica, metal dusts, and toxic fumes.  Sarcoidosis. In this disease, areas of inflammatory cells (granulomas) form and most often affect the lungs.  Autoimmune diseases. These include diseases such as rheumatoid arthritis, systemic sclerosis, or connective tissue disease.  Taking certain medicines. These include drugs used in radiation therapy or used to treat seizures, heart problems, and some infections. What increases the risk? You are more likely to develop this condition if:  You have a family history of the disease.  You are older. The condition is more common in older adults.  You have a history of smoking.  You have a job that exposes you to certain chemicals.  You have gastroesophageal reflux disease (GERD). What are the signs or symptoms? Symptoms of this condition include:  Difficulty breathing that gets worse with activity.  Shortness of breath (dyspnea).  Dry, hacking cough.  Rapid, shallow breathing during exercise or while at rest.  Bluish skin and lips.  Loss of appetite.  Weakness.  Weight loss and fatigue.  Rounded and enlarged fingertips (clubbing). How is this diagnosed? This condition may be diagnosed based on:  Your symptoms and medical history.  A physical exam. You may also have tests, including:  A test that involves looking inside your lungs with an instrument (bronchoscopy).  Imaging studies of your lungs and heart.  Tests to measure how well you are breathing (pulmonary function tests).  Blood tests.  Tests to see how well your lungs work while you are walking (pulmonary stress test).  A procedure to remove a lung tissue sample to look at it under a microscope (biopsy). How is this treated? There is no cure for pulmonary fibrosis. Treatment focuses on managing symptoms and preventing scarring from getting worse. This may include:  Medicines, such  as: ? Steroids to prevent permanent lung changes. ? Medicines to suppress your body's defense system (immune system). ? Medicines to help with lung function by reducing inflammation or scarring.  Ongoing monitoring with X-rays and lab work.  Oxygen therapy.  Pulmonary rehabilitation.  Surgery. In some cases, a lung transplant is possible. Follow these instructions at home:     Medicines  Take over-the-counter and prescription medicines only as told by your health care provider.  Keep your vaccinations up to date as recommended by your health care provider. General instructions  Do not use any products that contain nicotine or tobacco, such as cigarettes and e-cigarettes. If you need help quitting, ask your health care provider.  Get regular exercise, but do not overexert yourself. Ask your health care provider to suggest some activities that are safe for you to do. ? If you have physical limitations, you may get exercise by walking, using a stationary bike, or doing chair exercises. ? Ask your health care provider about using oxygen while exercising.  If you are exposed to chemicals and substances at work, make sure that you wear a mask or respirator at all times.  Join a pulmonary rehabilitation program or a support group for people with pulmonary fibrosis.  Eat small meals often so you do not get too full. Overeating can make breathing trouble worse.  Maintain a healthy weight. Lose weight if you need to.  Do breathing exercises as directed by your health care provider.  Keep all follow-up visits as told by your health care provider. This is important. Contact a health care provider if you:  Have symptoms that do not get better with medicines.  Are not able to be as active as usual.  Have trouble taking a deep breath.  Have a fever or chills.  Have blue lips or skin.  Have clubbing of your fingers. Get help right away if you:  Have a sudden worsening of your  symptoms.  Have chest pain.  Cough up mucus that is dark in color.  Have a lot of headaches.  Get very confused or sleepy. Summary  Pulmonary fibrosis is a type of lung disease that causes scar tissue to build up in the air sacs of your lungs (alveoli) over time. Less oxygen can get into your blood. This makes it hard for you to breathe.  Scarring from pulmonary fibrosis gets worse over time. This damage is permanent and may lead to other serious health problems.  You are more likely to develop this condition if you have a family history of the condition or a job that exposes you to certain chemicals.  There is no cure for pulmonary fibrosis. Treatment focuses on managing symptoms and  preventing scarring from getting worse. This information is not intended to replace advice given to you by your health care provider. Make sure you discuss any questions you have with your health care provider. Document Revised: 07/30/2017 Document Reviewed: 07/30/2017 Elsevier Patient Education  2020 [employer].

## 2019-07-16 NOTE — Progress Notes (Signed)
Chief Complaint  Patient presents with  . Loss of Consciousness    x2 wks ago. Pt stated that he was standing at the sink and statrted feeling week and his legs began to buckle and he went down to the floor. Since that incident, he hasn't fell since.    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  He had an episode approximately 2 weeks ago where he was standing at the sink and started to feel weak into his bilateral lower extremities.  Felt his legs buckle and the next thing he remembers is being on the floor.  He felt somewhat dizzy but not significantly so prior to this happening.  He had not eaten breakfast and he had taken his medication.  It was while he was also on methotrexate. Denies recurrent dizziness or symptoms of passing out.  No cardiac symptoms yet.  Sometimes has some difficulty breathing and he was put on inhaler which is albuterol and is using that daily.  We discussed potentially adding a medication that would help particularly with any future progression of his pneumonitis.  He is following with rheumatology for this Problem List    Problem List: 2020-01: Bilateral wrist pain 2019-08: Pain in joint of left shoulder 2018-04: Seasonal allergic rhinitis due to pollen 2018-04: BMI 26.0-26.9,adult 2017-10: Renal insufficiency 2017-10: Glucose intolerance (impaired glucose tolerance) 2013-09: Hyperlipidemia   Allergies   has No Known Allergies.  Medications    Current Outpatient Medications:  .  albuterol (VENTOLIN HFA) 108 (90 Base) MCG/ACT inhaler, Inhale 2 puffs into the lungs every 6 (six) hours as needed for wheezing or shortness of breath., Disp: 18 g, Rfl: 1 .  aspirin EC 81 MG tablet, Take 81 mg by mouth daily., Disp: , Rfl:  .  atorvastatin (LIPITOR) 20 MG tablet, Take 1 tablet (20 mg total) by mouth daily at 6 PM., Disp: 90 tablet, Rfl: 3 .  azelastine (ASTELIN) 0.1 % nasal  spray, Place 2 sprays into both nostrils 2 (two) times daily. Use in each nostril as directed, Disp: 30 mL, Rfl: 5 .  diclofenac sodium (VOLTAREN) 1 % GEL, Apply 2 g topically 4 (four) times daily., Disp: 100 g, Rfl: 1 .  famotidine (PEPCID) 20 MG tablet, Take 1 tablet (20 mg total) by mouth 2 (two) times daily. For cough and hiccupping., Disp: 60 tablet, Rfl: 0 .  Golimumab (SIMPONI ARIA IV), Inject into the vein. Every 8 weeks infusion, Disp: , Rfl:  .  hydrocortisone (PROCTOSOL HC) 2.5 % rectal cream, APPLY ONE APPLICATORFUL RECTALLY TWICE DAILY as needed for hemorrhoid pain/itching/bleeding, Disp: 29 g, Rfl: 3 .  meloxicam (MOBIC) 7.5 MG tablet, Take 1 tablet (7.5 mg total) by mouth daily as needed for pain., Disp: 30 tablet, Rfl: 3 .  methotrexate (50 MG/ML) 1 g injection, Inject into the vein once., Disp: , Rfl:  .  Methotrexate Sodium (METHOTREXATE, PF,) 200 MG/8ML injection, Inject 200 mg into the muscle once a week. Weekly injection at Rheumatology, Disp: , Rfl:  .  tadalafil (ADCIRCA/CIALIS) 20 MG tablet, Take 0.5 tablets (10 mg total) by mouth every other day as needed for erectile dysfunction., Disp: 5 tablet, Rfl: 3 .  tadalafil (CIALIS) 20 MG tablet, Take 20 mg by mouth daily., Disp: , Rfl:  .  budesonide-formoterol (SYMBICORT) 80-4.5 MCG/ACT inhaler, Inhale 2 puffs into the lungs 2 (two) times daily., Disp: 1 Inhaler, Rfl: 3   Review  of Systems    Constitutional: Negative for activity change, appetite change, chills and fever.  HENT: Negative for congestion, nosebleeds, trouble swallowing and voice change.   Respiratory: Negative for cough, shortness of breath and wheezing.   Gastrointestinal: Negative for diarrhea, nausea and vomiting.  Genitourinary: Negative for difficulty urinating, dysuria, flank pain and hematuria.  Musculoskeletal: Negative for back pain, joint swelling and neck pain.  Neurological: Neurological:  _+ for weakness, dizziness at the time of event Negative  speech difficulty, light-headedness and numbness.  See HPI. All other review of systems negative.     Physical Exam:   Physical Examination: General appearance - alert, well appearing, and in no distress and oriented to person, place, and time Mental status - normal mood, behavior, speech, dress, motor activity, and thought processes Eyes - not examined, Visually impaired -Earl Thomas Neck - supple, no significant adenopathy, carotids upstroke normal bilaterally, no bruits, thyroid exam: thyroid is normal in size without nodules or tenderness Chest - clear to auscultation, no wheezes, rales or rhonchi, symmetric air entry  Heart - normal rate, regular rhythm, normal S1, S2, no murmurs, rubs, clicks or gallops Extremities - dependent LE edema without clubbing or cyanosis Skin - normal coloration and turgor, no rashes, no suspicious skin lesions noted  No hyperpigmentation of skin.  No current hematomas noted   Lab Review   labs are reviewed, up to date and normal.   Assessment & Plan:  Earl Thomas is a 69 y.o. male . 1. Need for prophylactic vaccination against Streptococcus pneumoniae (pneumococcus)   2. ILD (interstitial lung disease) ([place])    Orders Placed This Encounter  Procedures  . CMP14+EGFR   Meds ordered this encounter  Medications  . budesonide-formoterol (SYMBICORT) 80-4.5 MCG/ACT inhaler    Sig: Inhale 2 puffs into the lungs 2 (two) times daily.    Dispense:  1 Inhaler    Refill:  3   We will check some basic blood work work-up of his liver function and kidney function to make sure there is no electrolyte abnormality that could be augmenting the dizziness events.  I have started him on Symbicort low-dose to aid with breathing.  He has follow-up with a lung specialist for his pneumonitis.  Should any of these symptoms recur or he has another event he is to let us know and we can send him to cardiology for further testing.  He is in line with this plan.  A total  of  40 minutes were spent face-to-face with the patient during this encounter and over half of that time was spent on counseling and coordination of care.   Glyn Ade, NP

## 2019-07-17 LAB — CMP14+EGFR
ALT: 26 [IU]/L (ref 0–44)
AST: 18 [IU]/L (ref 0–40)
Albumin/Globulin Ratio: 1 — ABNORMAL LOW (ref 1.2–2.2)
Albumin: 4 g/dL (ref 3.8–4.8)
Alkaline Phosphatase: 57 [IU]/L (ref 39–117)
BUN/Creatinine Ratio: 15 (ref 10–24)
BUN: 14 mg/dL (ref 8–27)
Bilirubin Total: 0.3 mg/dL (ref 0.0–1.2)
CO2: 22 mmol/L (ref 20–29)
Calcium: 9.4 mg/dL (ref 8.6–10.2)
Chloride: 101 mmol/L (ref 96–106)
Creatinine, Ser: 0.94 mg/dL (ref 0.76–1.27)
GFR calc Af Amer: 97 mL/min/{1.73_m2}
GFR calc non Af Amer: 84 mL/min/{1.73_m2}
Globulin, Total: 3.9 g/dL (ref 1.5–4.5)
Glucose: 114 mg/dL — ABNORMAL HIGH (ref 65–99)
Potassium: 4.2 mmol/L (ref 3.5–5.2)
Sodium: 139 mmol/L (ref 134–144)
Total Protein: 7.9 g/dL (ref 6.0–8.5)

## 2019-07-22 ENCOUNTER — Encounter: Payer: Self-pay | Admitting: Family Medicine

## 2019-07-23 ENCOUNTER — Other Ambulatory Visit (HOSPITAL_COMMUNITY)
Admission: RE | Admit: 2019-07-23 | Discharge: 2019-07-23 | Disposition: A | Discharge status: Home / Self Care | Payer: Medicare Other | Source: Ambulatory Visit | Attending: Internal Medicine | Admitting: Internal Medicine

## 2019-07-23 DIAGNOSIS — Z20822 Contact with and (suspected) exposure to covid-19: Secondary | ICD-10-CM | POA: Insufficient documentation

## 2019-07-23 DIAGNOSIS — M0579 Rheumatoid arthritis with rheumatoid factor of multiple sites without organ or systems involvement: Secondary | ICD-10-CM | POA: Diagnosis not present

## 2019-07-23 DIAGNOSIS — Z01812 Encounter for preprocedural laboratory examination: Secondary | ICD-10-CM | POA: Diagnosis not present

## 2019-07-23 LAB — SARS CORONAVIRUS 2 (TAT 6-24 HRS): SARS Coronavirus 2: NEGATIVE

## 2019-07-26 ENCOUNTER — Ambulatory Visit (INDEPENDENT_AMBULATORY_CARE_PROVIDER_SITE_OTHER): Payer: Medicare Other | Admitting: Internal Medicine

## 2019-07-26 ENCOUNTER — Other Ambulatory Visit: Payer: Self-pay

## 2019-07-26 DIAGNOSIS — J849 Interstitial pulmonary disease, unspecified: Secondary | ICD-10-CM

## 2019-07-26 LAB — PULMONARY FUNCTION TEST
DL/VA % pred: 82 %
DL/VA: 3.48 ml/min/mmHg/L
DLCO unc % pred: 52 %
DLCO unc: 11.79 ml/min/mmHg
FEF 25-75 Post: 3.86 L/sec
FEF 25-75 Pre: 4.03 L/sec
FEF2575-%Change-Post: -4 %
FEF2575-%Pred-Post: 175 %
FEF2575-%Pred-Pre: 183 %
FEV1-%Change-Post: 5 %
FEV1-%Pred-Post: 66 %
FEV1-%Pred-Pre: 63 %
FEV1-Post: 1.61 L
FEV1-Pre: 1.52 L
FEV1FVC-%Change-Post: -11 %
FEV1FVC-%Pred-Pre: 129 %
FEV6-%Change-Post: 19 %
FEV6-%Pred-Post: 60 %
FEV6-%Pred-Pre: 50 %
FEV6-Post: 1.82 L
FEV6-Pre: 1.52 L
FEV6FVC-%Pred-Post: 105 %
FEV6FVC-%Pred-Pre: 105 %
FVC-%Change-Post: 19 %
FVC-%Pred-Post: 57 %
FVC-%Pred-Pre: 47 %
FVC-Post: 1.82 L
FVC-Pre: 1.52 L
Post FEV1/FVC ratio: 88 %
Post FEV6/FVC ratio: 100 %
Pre FEV1/FVC ratio: 100 %
Pre FEV6/FVC Ratio: 100 %
RV % pred: 84 %
RV: 1.77 L
TLC % pred: 57 %
TLC: 3.42 L

## 2019-07-26 NOTE — Progress Notes (Signed)
PFT done today. 

## 2019-08-03 ENCOUNTER — Ambulatory Visit: Payer: Medicare Other | Admitting: Internal Medicine

## 2019-08-05 ENCOUNTER — Ambulatory Visit: Payer: Medicare Other | Admitting: Internal Medicine

## 2019-08-06 ENCOUNTER — Ambulatory Visit (INDEPENDENT_AMBULATORY_CARE_PROVIDER_SITE_OTHER): Payer: Medicare Other | Admitting: Internal Medicine

## 2019-08-06 ENCOUNTER — Encounter: Payer: Self-pay | Admitting: Internal Medicine

## 2019-08-06 ENCOUNTER — Other Ambulatory Visit: Payer: Self-pay

## 2019-08-06 DIAGNOSIS — J849 Interstitial pulmonary disease, unspecified: Secondary | ICD-10-CM

## 2019-08-06 DIAGNOSIS — J3089 Other allergic rhinitis: Secondary | ICD-10-CM | POA: Diagnosis not present

## 2019-08-06 DIAGNOSIS — M0579 Rheumatoid arthritis with rheumatoid factor of multiple sites without organ or systems involvement: Secondary | ICD-10-CM | POA: Diagnosis not present

## 2019-08-06 MED ORDER — FLUTICASONE PROPIONATE 50 MCG/ACT NA SUSP: 1.0000 | Freq: Every day | NASAL | 2 refills | Status: DC

## 2019-08-06 NOTE — Progress Notes (Signed)
Earl Thomas    [id number]    Nov 27, 1950  Primary Care Physician:Stallings, Arlie Solomons, MD Date of Appointment: 08/06/2019 Established Patient Visit  Chief complaint:   Chief Complaint  Patient presents with  . Follow-up    To discuss CT scan (06/02/19) or PFT (07/26/19). States he has had an increase in his coughing, non productive. SOB only with exertion.      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   Interval Updates:  Still on simponi and methotrexate. MTX was switched to IV. Still having dyspnea and coughing.   I have reviewed the patient's family social and past medical history and updated as appropriate.   Not taking prednisone anymore due to pre-diabetes. Just on meloxicam for pain control.  Having rhinorrhea which wife says is contributing to his cough.  I have received records of labs from [facility] NP Leafy Kindle but no office reports.  Denies heart burn.  Was started on symbicort by PCP. Denies thrush.  Past Medical History:  Diagnosis Date  . Arthritis   . Glucose intolerance (impaired glucose tolerance)   . Hyperlipidemia   . 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    Past Surgical History:  Procedure Laterality Date  . ROTATOR CUFF REPAIR Right 01/02/16    Family History  Problem Relation Age of Onset  . Cancer Father 65       prostate cancer  . Diabetes Father   . Heart disease Father 68       AMI late 59s  . Cancer Mother 40       Breast cancer  . Heart disease Mother        CABG at age 49  . Diabetes Brother   . Heart disease Brother        AMI x 2; CABG  . Hyperlipidemia Brother     Social History   Occupational History  . Occupation: Professor  Tobacco Use  . Smoking status: Passive Smoke Exposure - Never Smoker  . Smokeless tobacco: Never Used  . Tobacco comment: parent smoked in home as a child  Substance and Sexual Activity  . Alcohol use: No  .  Drug use: No  . Sexual activity: Yes    Review of systems: Constitutional: No fevers, chills, night sweats, or weight loss. CV: No chest pain, or palpitations. Resp: No hemoptysis.  Physical Exam: There were no vitals taken for this visit.    Data Reviewed: Imaging: I have personally reviewed the CT Chest with probable UIP pattern.  PFTs:  PFT Results Latest Ref Rng & Units 07/26/2019  FVC-Pre L 1.52  FVC-Predicted Pre % 47  FVC-Post L 1.82  FVC-Predicted Post % 57  Pre FEV1/FVC % % 100  Post FEV1/FCV % % 88  FEV1-Pre L 1.52  FEV1-Predicted Pre % 63  FEV1-Post L 1.61  DLCO UNC% % 52  DLCO COR %Predicted % 82  TLC L 3.42  TLC % Predicted % 57  RV % Predicted % 84   I have personally reviewed the patient's PFTs and which show moderate restriction with an FEV1 63% of predicted and FVC 47% of predicted.   Labs:  Immunization status: Immunization History  Administered Date(s) Administered  . Influenza, High Dose Seasonal PF 04/24/2019  . Influenza,inj,Quad PF,6+ Mos 05/30/2015, 04/23/2016, 05/14/2017, 03/25/2018  . Influenza-Unspecified 04/07/2014  . Pneumococcal Conjugate-13 05/18/2018  . Pneumococcal Polysaccharide-23 06/09/2019  . Tdap 02/13/2009,  05/14/2017  . Zoster 04/12/2014  . 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  I have discussed his case in our multi-disciplinary ILD conference.  Ideally we would stop Stop methotrexate, continue Simponi and Add cell cept for his pulmonary and joint involvement.  Pending his PFTs at 3 months he may need Ofev down the line for fibrosis.   I spent time trying to contact his rheumatologist and was not able to get through to her today.  I need to speak with them before changing his medications. The patient is aware. I will follow up on this next week.   For his rhinitis we will trial flonase and  see if this helps.   I spent 41 minutes on 08/06/2019 in care of this patient including face to face time and non-face to face time spent charting, review of outside records, and coordination of care.   Return to Care: Return in about 3 months (around 11/04/2019).   Lenice Llamas, MD Pulmonary and [place]

## 2019-08-09 ENCOUNTER — Telehealth: Payer: Self-pay | Admitting: Internal Medicine

## 2019-08-09 NOTE — Telephone Encounter (Signed)
Will call [name] back tomorrow.

## 2019-08-11 NOTE — Telephone Encounter (Signed)
Earl Thomas, can triage help with this? Is the pt needing an appt with [place] rheum?

## 2019-08-12 ENCOUNTER — Telehealth: Payer: Self-pay | Admitting: Internal Medicine

## 2019-08-12 NOTE — Telephone Encounter (Signed)
Please let him know that I am still waiting for his rheumatologist Leafy Kindle to call me back - she returned my call only today and I am waiting for her to call me anytime before I can change meds. I haven't forgotten about him!

## 2019-08-12 NOTE — Telephone Encounter (Signed)
Patient is returning phone call.  Patient phone number is [phone].

## 2019-08-12 NOTE — Telephone Encounter (Signed)
Called and spoke with pt letting him know the info stated by Dr. Shearon Stalls. Stated to pt once Dr. Shearon Stalls hears from his rhematologist, she will then go from there about seeing if she can change meds. Pt verbalized understanding. Nothing further needed.

## 2019-08-12 NOTE — Telephone Encounter (Signed)
Staff message has been sent to Dr Shearon Stalls to see when she will be available to speak with [name] about patient.

## 2019-08-12 NOTE — Telephone Encounter (Signed)
Pt calling to see if Dr. Shearon Stalls had called in a prescription for him. He was also wanting to know if so, was it the medicine that they had conversed about. [phone].

## 2019-08-12 NOTE — Telephone Encounter (Signed)
Called and spoke with pt to verify which med he was talking about that had been discussed with him and Dr. Shearon Stalls at last [place]. Pt stated Dr. Shearon Stalls had discussed beginning him on a med for his ILD/RA that she said she would discuss this with his rheumatologist and then have the med sent to pharmacy for him.  I am not seeing any med sent to pharmacy for pt by Dr. Shearon Stalls other than flonase.  Dr. Shearon Stalls, please advise on this for pt.

## 2019-08-12 NOTE — Telephone Encounter (Signed)
Attempted to call pt but unable to reach. Left message for pt to return call. 

## 2019-08-13 ENCOUNTER — Telehealth: Payer: Self-pay | Admitting: Internal Medicine

## 2019-08-13 NOTE — Telephone Encounter (Signed)
Pt calling to talk to Dr. Shearon Stalls because his job wants him to  Come back to work during office hours and he wants advice on what would be the safest plan with him having lung disease. 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  Pt can be reached at [phone].

## 2019-08-13 NOTE — Telephone Encounter (Signed)
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 He wants to know if Dr. Shearon Stalls thinks he should be there on campus with his lung disease. If Dr. Shearon Stalls thinks he shouldn't be on campus, she will have to write a letter stating he is unable to work on campus. Please advsie if you would like to compose letter.

## 2019-08-16 NOTE — Telephone Encounter (Signed)
Spero Geralds, MD  Valerie Salts, CMA  Hi Please give her my cell phone number and tell her to call me any time [phone].  Thank you cherina.  ---------------------------------------------------------------------------- [facility] and spoke with [name]. She states that [name] is currently on vacation. [name] will be back in the office on 08/24/2019. She asks that we call back at that time to give Dr. Mauricio Po number to [name].

## 2019-08-18 NOTE — Telephone Encounter (Signed)
Message will be routed to APP of the day as Dr. Shearon Stalls is not available.

## 2019-08-18 NOTE — Telephone Encounter (Signed)
Spoke with patient. Advised him that [place] from [facility] will not be back in her office until 08/24/19 and perhaps Dr. Shearon Stalls can speak with her next week. He verbalized understanding.

## 2019-08-18 NOTE — Telephone Encounter (Signed)
Patient checking to see if we have heard from [facility].  Patient phone number is [phone].

## 2019-08-18 NOTE — Telephone Encounter (Signed)
This needs to be addressed by Dr. Shearon Stalls.  In the event he does have to go in to his office for Office Hours prior to Dr. 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  Please route to Dr. Shearon Stalls to address once she is back in the office.

## 2019-08-24 MED ORDER — OFEV 100 MG PO CAPS: 100.0000 mg | ORAL_CAPSULE | Freq: Two times a day (BID) | ORAL | 3 refills | Status: DC

## 2019-08-24 NOTE — Telephone Encounter (Signed)
Returned patient's call. 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  He has received both covid vaccine doses already. I did not hear back from Leafy Kindle at [facility]. At this point since he has significant joint involvement would be better to keep him on MTX and I will add Ofev. Ordered today.

## 2019-08-24 NOTE — Telephone Encounter (Signed)
Dr. Shearon Stalls - please advise. Thanks.

## 2019-08-24 NOTE — Telephone Encounter (Signed)
I have started paperwork for pt's OFEV start. Pt will need to receive med from specialty pharmacy as it will not be able to come from his local pharmacy.  Called and spoke with pt about the paperwork and pt stated he would come by the office tomorrow to pick up his part of the assistance paperwork to fill out. I made pt aware that the med will have to come from a specialty pharmacy and he verbalized understanding.   I will have Dr. Shearon Stalls sign the patient assistance program form that needs a signature from her as well as the provider enrollment form and then will give to our pharmacy team.  Routing to [employer].

## 2019-08-25 ENCOUNTER — Telehealth: Payer: Self-pay | Admitting: Pharmacy Technician

## 2019-08-25 NOTE — Telephone Encounter (Signed)
Received notification from [facility] regarding a prior authorization for OFEV 163m. Authorization has been APPROVED from 05/27/2019 to 08/24/2020.   Authorization # [id number]

## 2019-08-25 NOTE — Telephone Encounter (Signed)
Received Ofev 165m BID new start paperwork. Will update as we work through the benefits process. Patient will be coming by office to pickup his patient assistance application.   9:19 AM RBeatriz Chancellor CPhT

## 2019-08-26 NOTE — Telephone Encounter (Signed)
Ran test claim, patient has a $30.00 copay for 1 month supply of Ofev.

## 2019-09-03 NOTE — Telephone Encounter (Signed)
Called to notify patient of approval.  Scheduled a new start appointment on 09/06/19 at 3pm to review medication and assist with specialty pharmacy set up.  Mariella Saa, PharmD, [place], [place] Clinical Specialty Pharmacist [phone]  09/03/2019 3:18 PM

## 2019-09-03 NOTE — Telephone Encounter (Signed)
Spoke with [employer] in pharmacy  Thank you. We will reach out to him about ofev script. Triage can close the encoutner. Thank you!   Will open to follow up on once patient is set up with medication

## 2019-09-06 ENCOUNTER — Other Ambulatory Visit: Payer: Self-pay | Admitting: Pharmacist

## 2019-09-06 ENCOUNTER — Ambulatory Visit (INDEPENDENT_AMBULATORY_CARE_PROVIDER_SITE_OTHER): Payer: Medicare Other | Admitting: Pharmacist

## 2019-09-06 ENCOUNTER — Other Ambulatory Visit: Payer: Self-pay

## 2019-09-06 DIAGNOSIS — J849 Interstitial pulmonary disease, unspecified: Secondary | ICD-10-CM

## 2019-09-06 DIAGNOSIS — Z79899 Other long term (current) drug therapy: Secondary | ICD-10-CM | POA: Diagnosis not present

## 2019-09-06 MED ORDER — OFEV 150 MG PO CAPS: 150.0000 mg | ORAL_CAPSULE | Freq: Two times a day (BID) | ORAL | 2 refills | Status: DC

## 2019-09-06 NOTE — Progress Notes (Signed)
Subjective:  Patient presents today to [facility] to see pharmacy team for nintedanib (Ofev) therapeutic drug initiation and monitoring. Patient was last seen and referred by Dr. Shearon Stalls on 08/13/2019. Pertinent past medical history includes ILD (RA-ILD UIP), rheumatoid lung disease with rheumatoid arthritis, HLD. Patient has never been on medication for ILD previously. Patient will receive Ofev via [facility] for $30 copay for 30 day supply.  Patient presents today for Initial appt with pharmacy team. Patient has not obtained Ofev from pharmacy yet. Patient facetimes wife in midst of appointment to discuss questions regarding ILD and ILD medication management.  Objective: No Known Allergies  Outpatient Encounter Medications as of 09/06/2019  Medication Sig Note  . albuterol (VENTOLIN HFA) 108 (90 Base) MCG/ACT inhaler Inhale 2 puffs into the lungs every 6 (six) hours as needed for wheezing or shortness of breath.   Marland Kitchen aspirin EC 81 MG tablet Take 81 mg by mouth daily.   Marland Kitchen atorvastatin (LIPITOR) 20 MG tablet Take 1 tablet (20 mg total) by mouth daily at 6 PM.   . azelastine (ASTELIN) 0.1 % nasal spray Place 2 sprays into both nostrils 2 (two) times daily. Use in each nostril as directed   . budesonide-formoterol (SYMBICORT) 80-4.5 MCG/ACT inhaler Inhale 2 puffs into the lungs 2 (two) times daily.   . diclofenac sodium (VOLTAREN) 1 % GEL Apply 2 g topically 4 (four) times daily.   . famotidine (PEPCID) 20 MG tablet Take 1 tablet (20 mg total) by mouth 2 (two) times daily. For cough and hiccupping.   . fluticasone (FLONASE) 50 MCG/ACT nasal spray Place 1 spray into both nostrils daily.   . Golimumab ([place] ARIA IV) Inject into the vein. Every 8 weeks infusion   . hydrocortisone (PROCTOSOL HC) 2.5 % rectal cream APPLY ONE APPLICATORFUL RECTALLY TWICE DAILY as needed for hemorrhoid pain/itching/bleeding 05/18/2018: rn  . meloxicam (MOBIC) 7.5 MG tablet Take 1 tablet (7.5 mg  total) by mouth daily as needed for pain.   . methotrexate (50 MG/ML) 1 g injection Inject into the vein once.   . Methotrexate Sodium (METHOTREXATE, PF,) 200 MG/8ML injection Inject 200 mg into the muscle once a week. Weekly injection at Rheumatology   . Nintedanib (OFEV) 100 MG CAPS Take 1 capsule (100 mg total) by mouth 2 (two) times daily.   . tadalafil (ADCIRCA/CIALIS) 20 MG tablet Take 0.5 tablets (10 mg total) by mouth every other day as needed for erectile dysfunction.   . tadalafil (CIALIS) 20 MG tablet Take 20 mg by mouth daily.    No facility-administered encounter medications on file as of 09/06/2019.     Immunization History  Administered Date(s) Administered  . Influenza, High Dose Seasonal PF 04/24/2019  . Influenza,inj,Quad PF,6+ Mos 05/30/2015, 04/23/2016, 05/14/2017, 03/25/2018  . Influenza-Unspecified 04/07/2014  . Pneumococcal Conjugate-13 05/18/2018  . Pneumococcal Polysaccharide-23 06/09/2019  . Tdap 02/13/2009, 05/14/2017  . Zoster 04/12/2014  . Zoster Recombinat (Shingrix) 05/27/2018    PFT's TLC  Date Value Ref Range Status  07/26/2019 3.42 L Final    CMP     Component Value Date/Time   NA 139 07/16/2019 1005   K 4.2 07/16/2019 1005   CL 101 07/16/2019 1005   CO2 22 07/16/2019 1005   GLUCOSE 114 (H) 07/16/2019 1005   GLUCOSE 87 04/23/2016 1619   BUN 14 07/16/2019 1005   CREATININE 0.94 07/16/2019 1005   CREATININE 1.21 04/23/2016 1619   CALCIUM 9.4 07/16/2019 1005   PROT 7.9 07/16/2019 1005  ALBUMIN 4.0 07/16/2019 1005   AST 18 07/16/2019 1005   ALT 26 07/16/2019 1005   ALKPHOS 57 07/16/2019 1005   BILITOT 0.3 07/16/2019 1005   GFRNONAA 84 07/16/2019 1005   GFRNONAA 60 01/11/2016 1431   GFRAA 97 07/16/2019 1005   GFRAA 70 01/11/2016 1431     CBC    Component Value Date/Time   WBC 4.8 11/12/2017 1001   WBC 5.2 04/23/2016 1619   RBC 5.28 11/12/2017 1001   RBC 5.56 04/23/2016 1619   HGB 14.8 11/12/2017 1001   HCT 45.3 11/12/2017 1001     PLT 197 11/12/2017 1001   MCV 86 11/12/2017 1001   MCH 28.0 11/12/2017 1001   MCH 27.9 04/23/2016 1619   MCHC 32.7 11/12/2017 1001   MCHC 32.9 04/23/2016 1619   RDW 14.5 11/12/2017 1001   LYMPHSABS 1.7 11/12/2017 1001   MONOABS 416 04/23/2016 1619   EOSABS 0.2 11/12/2017 1001   BASOSABS 0.0 11/12/2017 1001     LFT's Hepatic Function Latest Ref Rng & Units 07/16/2019 05/14/2019 05/18/2018  Total Protein 6.0 - 8.5 g/dL 7.9 6.8 7.8  Albumin 3.8 - 4.8 g/dL 4.0 4.0 4.7  AST 0 - 40 IU/L _0 ALT 0 - 44 IU/L _1 Alk Phosphatase 39 - 117 IU/L 57 48 53  Total Bilirubin 0.0 - 1.2 mg/dL 0.3 0.5 0.6     HRCT (06/02/2019) IMPRESSION: 1. The appearance of the lungs is compatible with interstitial lung disease, with a spectrum of findings considered diagnostic of usual interstitial pneumonia (UIP) per current ATS guidelines. 2. Aortic atherosclerosis, in addition to 2 vessel coronary artery disease.  Assessment and Plan  1. Nintedanib (Ofev) Medication Management Patient counseled on purpose, proper use, and potential adverse effects including diarrhea, nausea, vomiting, abdominal pain, decreased appetite, weight loss, and increased blood pressure. Stressed the importance of routine lab monitoring. Will monitor LFT's every month for the first 6 months of treatment then every 3 months. Will monitor CBC every 3 months.  Ofev dose will be 150 mg capsule every 12 hours with food. Stressed importance of taking with food to minimize stomach upset.  2. Medication Reconciliation A drug regimen assessment was performed, including review of allergies, interactions, disease-state management, dosing and immunization history. Medications were reviewed with the patient, including name, instructions, indication, goals of therapy, potential side effects, importance of adherence, and safe use. Patient does not have any concerning drug interactions.   3. Immunizations Patient is up to date with  influenzae, pneumonia, and shingles vaccinations.  This appointment required 30 minutes of patient care (this includes precharting, chart review, review of results, face-to-face care, etc.).  Thank you for involving pharmacy to assist in providing this patient's care.   Drexel Iha, PharmD PGY2 Ambulatory Care Pharmacy Resident

## 2019-09-06 NOTE — Patient Instructions (Signed)
It was a pleasure seeing you in clinic today Mr. Rekowski!  Today the plan is... 1. START taking Ofev one capsule by mouth twice daily (about 12 hours apart). This medication will cost $30 for a 30-day supply and you will receive the medication from [facility] (phone number [phone]).   Please call the PharmD clinic at [phone] if you have any questions that you would like to speak with a pharmacist about Stanton Kidney, Museum/gallery conservator).

## 2019-09-07 ENCOUNTER — Ambulatory Visit (INDEPENDENT_AMBULATORY_CARE_PROVIDER_SITE_OTHER): Payer: Medicare Other | Admitting: Family Medicine

## 2019-09-07 ENCOUNTER — Other Ambulatory Visit: Payer: Self-pay

## 2019-09-07 VITALS — BP 115/78 | HR 81 | Temp 97.9°F | Resp 14 | Ht 65.0 in | Wt 138.0 lb

## 2019-09-07 DIAGNOSIS — E782 Mixed hyperlipidemia: Secondary | ICD-10-CM | POA: Diagnosis not present

## 2019-09-07 DIAGNOSIS — R7302 Impaired glucose tolerance (oral): Secondary | ICD-10-CM

## 2019-09-07 DIAGNOSIS — J849 Interstitial pulmonary disease, unspecified: Secondary | ICD-10-CM

## 2019-09-07 LAB — POCT GLYCOSYLATED HEMOGLOBIN (HGB A1C): Hemoglobin A1C: 5.9 % — AB (ref 4.0–5.6)

## 2019-09-07 NOTE — Progress Notes (Signed)
Established Patient Office Visit  Subjective:  Patient ID: Earl Thomas, male    DOB: 1950/09/13  Age: 69 y.o. MRN: [id number]  CC:  Chief Complaint  Patient presents with  . Diabetes    pt has not been checking BS at home, has been working on diet and exercise to help control his sugars    HPI Earl Thomas presents for   ILD Pt was stared on Nintedanib (Ofev) for ILD with a 132m capsule bid.  He is concern about his weight loss.  The medicaiton has risk of upset stomach, weight loss, high blood pressure, appetite suppression and headaches. He gets most of the symptoms sometimes but reports that he is losing weight.   He will start this medication next week when it comes in the male.  He reports coughing and shortness of breath. He states that he was advised to take otc cough syrups.  Prediabetes He states that he is avoiding the sugary drinks but has an ensure to help slow down his weight loss.  He states that he has been coughing which decreases his ability to exercise.    He is on atrovastatin 27mfor his mixed hyperlipidemia He denies any muscle cramps He is complinat with the meds He eats a heavy calorie rich breakfast to keep his weight up.  Past Medical History:  Diagnosis Date  . Arthritis   . Glucose intolerance (impaired glucose tolerance)   . Hyperlipidemia   . 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    Past Surgical History:  Procedure Laterality Date  . ROTATOR CUFF REPAIR Right 01/02/16    Family History  Problem Relation Age of Onset  . Cancer Father 8064     prostate cancer  . Diabetes Father   . Heart disease Father 7865     AMI late 7061s. Cancer Mother 8588     Breast cancer  . Heart disease Mother        CABG at age 67. Diabetes Brother   . Heart disease Brother        AMI x 2; CABG  . Hyperlipidemia Brother     Social History   Socioeconomic History  . Marital status: Married    Spouse name: Not  on file  . Number of children: 2  . Years of education: Not on file  . Highest education level: Not on file  Occupational History  . Occupation: Professor  Tobacco Use  . Smoking status: Passive Smoke Exposure - Never Smoker  . Smokeless tobacco: Never Used  . Tobacco comment: parent smoked in home as a child  Substance and Sexual Activity  . Alcohol use: No  . Drug use: No  . Sexual activity: Yes  Other Topics Concern  . 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        Seatbelt:  100%   Social Determinants of Health   Financial Resource Strain:   . Difficulty  of Paying Living Expenses: Not on file  Food Insecurity:   . Worried About Charity fundraiser in the Last Year: Not on file  . Ran Out of Food in the Last Year: Not on file  Transportation Needs:   . Lack of Transportation (Medical): Not on file  . Lack of Transportation (Non-Medical): Not on file  Physical Activity:   . Days of Exercise per Week: Not on file  . Minutes of Exercise per Session: Not on file  Stress:   . Feeling of Stress : Not on file  Social Connections:   . Frequency of Communication with Friends and Family: Not on file  . Frequency of Social Gatherings with Friends and Family: Not on file  . Attends Religious Services: Not on file  . Active Member of Clubs or Organizations: Not on file  . Attends Archivist Meetings: Not on file  . Marital Status: Not on file  Intimate Partner Violence:   . Fear of Current or Ex-Partner: Not on file  . Emotionally Abused: Not on file  . Physically Abused: Not on  file  . Sexually Abused: Not on file    Outpatient Medications Prior to Visit  Medication Sig Dispense Refill  . albuterol (VENTOLIN HFA) 108 (90 Base) MCG/ACT inhaler Inhale 2 puffs into the lungs every 6 (six) hours as needed for wheezing or shortness of breath. 18 g 1  . aspirin EC 81 MG tablet Take 81 mg by mouth daily.    Marland Kitchen atorvastatin (LIPITOR) 20 MG tablet Take 1 tablet (20 mg total) by mouth daily at 6 PM. 90 tablet 3  . budesonide-formoterol (SYMBICORT) 80-4.5 MCG/ACT inhaler Inhale 2 puffs into the lungs 2 (two) times daily. 1 Inhaler 3  . diclofenac sodium (VOLTAREN) 1 % GEL Apply 2 g topically 4 (four) times daily. 100 g 1  . fluticasone (FLONASE) 50 MCG/ACT nasal spray Place 1 spray into both nostrils daily. 16 g 2  . folic acid ([name]) 1 MG tablet Take 1 mg by mouth daily.    . Golimumab ([place] ARIA IV) Inject into the vein. Every 8 weeks infusion    . hydrocortisone (PROCTOSOL HC) 2.5 % rectal cream APPLY ONE APPLICATORFUL RECTALLY TWICE DAILY as needed for hemorrhoid pain/itching/bleeding 29 g 3  . meloxicam (MOBIC) 7.5 MG tablet Take 1 tablet (7.5 mg total) by mouth daily as needed for pain. 30 tablet 3  . Methotrexate Sodium (METHOTREXATE, PF,) 50 MG/2ML injection 20 mg once a week.    . Nintedanib (OFEV) 150 MG CAPS Take 1 capsule (150 mg total) by mouth 2 (two) times daily. 60 capsule 2  . tadalafil (ADCIRCA/CIALIS) 20 MG tablet Take 0.5 tablets (10 mg total) by mouth every other day as needed for erectile dysfunction. 5 tablet 3   No facility-administered medications prior to visit.    No Known Allergies  ROS Review of Systems See hpi   Objective:    Physical Exam  BP 115/78   Pulse 81   Temp 97.9 F (36.6 C) (Temporal)   Resp 14   Ht _0  (1.651 m)   Wt 138 lb (62.6 kg)   SpO2 97%   BMI 22.96 kg/m  Wt Readings from Last 3 Encounters:  09/07/19 138 lb (62.6 kg)  07/16/19 139 lb 12.8 oz (63.4 kg)  06/09/19 148 lb 12.8 oz (67.5 kg)   Physical  Exam  Constitutional: Oriented to person, place, and time. Appears well-developed and well-nourished.  HENT:  Head:  Normocephalic and atraumatic.  Eyes: Conjunctivae and EOM are normal.  Cardiovascular: Normal rate, regular rhythm, normal heart sounds and intact distal pulses.  No murmur heard. Pulmonary/Chest: Effort normal, bilateral bases with crackles (sounds like velcro) without wheezing Neurological: Is alert and oriented to person, place, and time.  Skin: Skin is warm. Capillary refill takes less than 2 seconds.  Psychiatric: Has a normal mood and affect. Behavior is normal. Judgment and thought content normal.    There are no preventive care reminders to display for this patient.  There are no preventive care reminders to display for this patient.  Lab Results  Component Value Date   TSH 2.230 05/14/2017   Lab Results  Component Value Date   WBC 4.8 11/12/2017   HGB 14.8 11/12/2017   HCT 45.3 11/12/2017   MCV 86 11/12/2017   PLT 197 11/12/2017   Lab Results  Component Value Date   NA 139 07/16/2019   K 4.2 07/16/2019   CO2 22 07/16/2019   GLUCOSE 114 (H) 07/16/2019   BUN 14 07/16/2019   CREATININE 0.94 07/16/2019   BILITOT 0.3 07/16/2019   ALKPHOS 57 07/16/2019   AST 18 07/16/2019   ALT 26 07/16/2019   PROT 7.9 07/16/2019   ALBUMIN 4.0 07/16/2019   CALCIUM 9.4 07/16/2019   Lab Results  Component Value Date   CHOL 246 (H) 05/14/2019   Lab Results  Component Value Date   HDL 39 (L) 05/14/2019   Lab Results  Component Value Date   LDLCALC 193 (H) 05/14/2019   Lab Results  Component Value Date   TRIG 82 05/14/2019   Lab Results  Component Value Date   CHOLHDL 6.3 (H) 05/14/2019   Lab Results  Component Value Date   HGBA1C 6.2 (H) 05/14/2019      Assessment & Plan:   Problem List Items Addressed This Visit      Respiratory   ILD (interstitial lung disease) ([place]) (Chronic) - continue Rheumatology and Pulmonology recs Stable currently       Endocrine   Glucose intolerance (impaired glucose tolerance) - Primary (Chronic) - stable   Relevant Orders   POCT glycosylated hemoglobin (Hb A1C)     Other   Hyperlipidemia - Discussed medications that affect lipids Reminded patient to avoid grapefruits Reviewed last 3 lipids Discussed current meds: statin, aspirin Advised dietary fiber and fish oil and ways to keep HDL high CAD prevention and reviewed side effects of statins Patient is a nonsmoker.   Relevant Orders   Lipid panel       No orders of the defined types were placed in this encounter.   Follow-up: No follow-ups on file.    Forrest Moron, MD

## 2019-09-07 NOTE — Patient Instructions (Signed)
     If you have lab work done today you will be contacted with your lab results within the next 2 weeks.  If you have not heard from us then please contact us. The fastest way to get your results is to register for My Chart.   IF you received an x-ray today, you will receive an invoice from [facility]. Please contact [facility] at [phone] with questions or concerns regarding your invoice.   IF you received labwork today, you will receive an invoice from [facility]. Please contact [facility] at [phone] with questions or concerns regarding your invoice.   Our billing staff will not be able to assist you with questions regarding bills from these companies.  You will be contacted with the lab results as soon as they are available. The fastest way to get your results is to activate your My Chart account. Instructions are located on the last page of this paperwork. If you have not heard from us regarding the results in 2 weeks, please contact this office.     

## 2019-09-08 LAB — LIPID PANEL
Chol/HDL Ratio: 3.5 ratio (ref 0.0–5.0)
Cholesterol, Total: 108 mg/dL (ref 100–199)
HDL: 31 mg/dL — ABNORMAL LOW (ref >39)
LDL Chol Calc (NIH): 65 mg/dL (ref 0–99)
Triglycerides: 51 mg/dL (ref 0–149)
VLDL Cholesterol Cal: 12 mg/dL (ref 5–40)

## 2019-09-13 DIAGNOSIS — Z23 Encounter for immunization: Secondary | ICD-10-CM | POA: Diagnosis not present

## 2019-09-13 DIAGNOSIS — M79642 Pain in left hand: Secondary | ICD-10-CM | POA: Diagnosis not present

## 2019-09-13 DIAGNOSIS — M2550 Pain in unspecified joint: Secondary | ICD-10-CM | POA: Diagnosis not present

## 2019-09-13 DIAGNOSIS — Z6823 Body mass index (BMI) 23.0-23.9, adult: Secondary | ICD-10-CM | POA: Diagnosis not present

## 2019-09-13 DIAGNOSIS — R5382 Chronic fatigue, unspecified: Secondary | ICD-10-CM | POA: Diagnosis not present

## 2019-09-13 DIAGNOSIS — M79641 Pain in right hand: Secondary | ICD-10-CM | POA: Diagnosis not present

## 2019-09-13 DIAGNOSIS — M0579 Rheumatoid arthritis with rheumatoid factor of multiple sites without organ or systems involvement: Secondary | ICD-10-CM | POA: Diagnosis not present

## 2019-09-13 DIAGNOSIS — J84114 Acute interstitial pneumonitis: Secondary | ICD-10-CM | POA: Diagnosis not present

## 2019-09-17 DIAGNOSIS — M0579 Rheumatoid arthritis with rheumatoid factor of multiple sites without organ or systems involvement: Secondary | ICD-10-CM | POA: Diagnosis not present

## 2019-10-11 NOTE — Progress Notes (Signed)
HPI  Patient presents today to [facility] for Follow-Up appt with pharmacy team for Ofev.  Pertinent past medical history includes ILD (RA-ILD UIP), rheumatoid lung disease with rheumatoid arthritis, HLD. Patient started [facility] March 2021.  His last Simponi infusion was on 09/17/2019.  OBJECTIVE No Known Allergies  Outpatient Encounter Medications as of 10/12/2019  Medication Sig Note  . albuterol (VENTOLIN HFA) 108 (90 Base) MCG/ACT inhaler Inhale 2 puffs into the lungs every 6 (six) hours as needed for wheezing or shortness of breath.   Marland Kitchen aspirin EC 81 MG tablet Take 81 mg by mouth daily.   Marland Kitchen atorvastatin (LIPITOR) 20 MG tablet Take 1 tablet (20 mg total) by mouth daily at 6 PM.   . budesonide-formoterol (SYMBICORT) 80-4.5 MCG/ACT inhaler Inhale 2 puffs into the lungs 2 (two) times daily.   . diclofenac sodium (VOLTAREN) 1 % GEL Apply 2 g topically 4 (four) times daily.   . fluticasone (FLONASE) 50 MCG/ACT nasal spray Place 1 spray into both nostrils daily.   . folic acid ([name]) 1 MG tablet Take 1 mg by mouth daily.   . Golimumab ([place] ARIA IV) Inject into the vein. Every 8 weeks infusion   . hydrocortisone (PROCTOSOL HC) 2.5 % rectal cream APPLY ONE APPLICATORFUL RECTALLY TWICE DAILY as needed for hemorrhoid pain/itching/bleeding 05/18/2018: rn  . meloxicam (MOBIC) 7.5 MG tablet Take 1 tablet (7.5 mg total) by mouth daily as needed for pain.   . Methotrexate Sodium (METHOTREXATE, PF,) 50 MG/2ML injection 20 mg once a week.   . Nintedanib (OFEV) 150 MG CAPS Take 1 capsule (150 mg total) by mouth 2 (two) times daily.   . tadalafil (ADCIRCA/CIALIS) 20 MG tablet Take 0.5 tablets (10 mg total) by mouth every other day as needed for erectile dysfunction.    No facility-administered encounter medications on file as of 10/12/2019.     Immunization History  Administered Date(s) Administered  . Influenza, High Dose Seasonal PF 04/24/2019  . Influenza,inj,Quad PF,6+ Mos 05/30/2015,  04/23/2016, 05/14/2017, 03/25/2018  . Influenza-Unspecified 04/07/2014  . Pneumococcal Conjugate-13 05/18/2018  . Pneumococcal Polysaccharide-23 06/09/2019  . Tdap 02/13/2009, 05/14/2017  . Zoster 04/12/2014  . Zoster Recombinat (Shingrix) 05/27/2018     HRCT (06/02/2019) IMPRESSION: 1. The appearance of the lungs is compatible with interstitial lung disease, with a spectrum of findings considered diagnostic of usual interstitial pneumonia (UIP) per current ATS guidelines. 2. Aortic atherosclerosis, in addition to 2 vessel coronary artery Disease.  PFT's TLC  Date Value Ref Range Status  07/26/2019 3.42 L Final     CMP     Component Value Date/Time   NA 139 07/16/2019 1005   K 4.2 07/16/2019 1005   CL 101 07/16/2019 1005   CO2 22 07/16/2019 1005   GLUCOSE 114 (H) 07/16/2019 1005   GLUCOSE 87 04/23/2016 1619   BUN 14 07/16/2019 1005   CREATININE 0.94 07/16/2019 1005   CREATININE 1.21 04/23/2016 1619   CALCIUM 9.4 07/16/2019 1005   PROT 7.9 07/16/2019 1005   ALBUMIN 4.0 07/16/2019 1005   AST 18 07/16/2019 1005   ALT 26 07/16/2019 1005   ALKPHOS 57 07/16/2019 1005   BILITOT 0.3 07/16/2019 1005   GFRNONAA 84 07/16/2019 1005   GFRNONAA 60 01/11/2016 1431   GFRAA 97 07/16/2019 1005   GFRAA 70 01/11/2016 1431     CBC    Component Value Date/Time   WBC 4.8 11/12/2017 1001   WBC 5.2 04/23/2016 1619   RBC 5.28 11/12/2017 1001  RBC 5.56 04/23/2016 1619   HGB 14.8 11/12/2017 1001   HCT 45.3 11/12/2017 1001   PLT 197 11/12/2017 1001   MCV 86 11/12/2017 1001   MCH 28.0 11/12/2017 1001   MCH 27.9 04/23/2016 1619   MCHC 32.7 11/12/2017 1001   MCHC 32.9 04/23/2016 1619   RDW 14.5 11/12/2017 1001   LYMPHSABS 1.7 11/12/2017 1001   MONOABS 416 04/23/2016 1619   EOSABS 0.2 11/12/2017 1001   BASOSABS 0.0 11/12/2017 1001     LFT's Hepatic Function Latest Ref Rng & Units 07/16/2019 05/14/2019 05/18/2018  Total Protein 6.0 - 8.5 g/dL 7.9 6.8 7.8  Albumin 3.8 - 4.8 g/dL  4.0 4.0 4.7  AST 0 - 40 IU/L _0 ALT 0 - 44 IU/L _1 Alk Phosphatase 39 - 117 IU/L 57 48 53  Total Bilirubin 0.0 - 1.2 mg/dL 0.3 0.5 0.6     ASSESSMENT  1. Ofev Medication Management  Denies obtaining medication from pharmacy without issues. Denies  missing doses of medication. Reports side effects from medication. He experiences diarrhea which was relieved by imodium. He is taking as needed.  Has some nausea but is waxing and waning.  Patient states he is tolerating the medication fairly well.   He is due for repeat hepatic panel today and then in 1 month.  Future orders placed.  He is concerned about weight loss and would like to put back on some weight.  Advised we will continue to monitor his weight and will weigh at next office visit.  2. Medication Reconciliation  A drug regimen assessment was performed, including review of allergies, interactions, disease-state management, dosing and immunization history. Medications were reviewed with the patient, including name, instructions, indication, goals of therapy, potential side effects, importance of adherence, and safe use.  3. Immunizations  Patient is up-to-date with annual influenza, pneumonia, and shingles vaccines.  PLAN  CONTINUE Ofev 150 mg twice daily with food and Imodium as needed  Keep appointment with Dr. Shearon Stalls  Return in 1 month (1st week of May) for labs  All questions encouraged and answered.  Instructed patient to call with any further questions or concerns.  Thank you for allowing pharmacy to participate in this patient's care.  This appointment required 30 minutes of patient care (this includes precharting, chart review, review of results, face-to-face care, etc.).    Mariella Saa, PharmD, [place], [place] Clinical Specialty Pharmacist [phone]  10/12/2019 4:06 PM

## 2019-10-12 ENCOUNTER — Ambulatory Visit (INDEPENDENT_AMBULATORY_CARE_PROVIDER_SITE_OTHER): Payer: Medicare Other | Admitting: Pharmacist

## 2019-10-12 ENCOUNTER — Other Ambulatory Visit: Payer: Self-pay

## 2019-10-12 DIAGNOSIS — Z5181 Encounter for therapeutic drug level monitoring: Secondary | ICD-10-CM

## 2019-10-12 LAB — HEPATIC FUNCTION PANEL
ALT: 58 U/L — ABNORMAL HIGH (ref 0–53)
AST: 34 U/L (ref 0–37)
Albumin: 3.8 g/dL (ref 3.5–5.2)
Alkaline Phosphatase: 53 U/L (ref 39–117)
Bilirubin, Direct: 0.1 mg/dL (ref 0.0–0.3)
Total Bilirubin: 0.4 mg/dL (ref 0.2–1.2)
Total Protein: 7.7 g/dL (ref 6.0–8.3)

## 2019-10-12 NOTE — Patient Instructions (Signed)
CONTINUE Ofev 150 mg twice daily with food and Imodium as needed  Keep appointment with Dr. Shearon Stalls  Return in 1 month (1st week of May) for labs

## 2019-10-13 NOTE — Progress Notes (Addendum)
Hepatic panel within normal limits except for slightly elevated ALT at 58.  We will continue to monitor.  Patient notified.  Due for repeat hepatic panel in 1 month.

## 2019-10-20 ENCOUNTER — Telehealth: Payer: Self-pay | Admitting: Pharmacist

## 2019-10-20 NOTE — Telephone Encounter (Signed)
Patient had question about OTC medication.  He continues to have occasional nausea while taking Ofev.  He bought Nauzene and wanted to know if it interacted with any of his medications.  Nauzene is sodium citrate, and antacid.  Advised it does not interact with his Ofev. Patient verbalized understanding.  Advised patient that sometimes flat ginger ale can help with nausea and is an alternative option if Nauzene doesn't help. Patient verbalized understanding.   He has an appointment with Dr. Shearon Stalls on 4/30.  Instructed patient to reach out with any further questions and concerns or if symptoms worsen in the meantime.  Patient verbalized understanding.   Mariella Saa, PharmD, [place], [place] Clinical Specialty Pharmacist [phone]  10/20/2019 4:34 PM

## 2019-11-05 ENCOUNTER — Encounter: Payer: Self-pay | Admitting: Internal Medicine

## 2019-11-05 ENCOUNTER — Ambulatory Visit (INDEPENDENT_AMBULATORY_CARE_PROVIDER_SITE_OTHER): Payer: Medicare Other | Admitting: Internal Medicine

## 2019-11-05 ENCOUNTER — Other Ambulatory Visit: Payer: Self-pay

## 2019-11-05 VITALS — BP 90/66 | Temp 97.8°F | Ht 65.0 in | Wt 128.8 lb

## 2019-11-05 DIAGNOSIS — J849 Interstitial pulmonary disease, unspecified: Secondary | ICD-10-CM | POA: Diagnosis not present

## 2019-11-05 DIAGNOSIS — R05 Cough: Secondary | ICD-10-CM | POA: Diagnosis not present

## 2019-11-05 DIAGNOSIS — M0579 Rheumatoid arthritis with rheumatoid factor of multiple sites without organ or systems involvement: Secondary | ICD-10-CM

## 2019-11-05 DIAGNOSIS — R059 Cough, unspecified: Secondary | ICD-10-CM

## 2019-11-05 MED ORDER — PANTOPRAZOLE SODIUM 40 MG PO TBEC: 40.0000 mg | DELAYED_RELEASE_TABLET | Freq: Every day | ORAL | 11 refills | Status: DC

## 2019-11-05 NOTE — Patient Instructions (Addendum)
The patient should have follow up scheduled in 3 months with APP.   Prior to next visit patient should have: Spirometry prior to next appointment in 3 months.   Reduce Ofev to 150 mg once a day.  Start drinking boost/ensure. Smaller more frequent meals will be more helpful.   Start taking pantoprazole once a day for your cough which may be related to stomach reflux.

## 2019-11-05 NOTE — Progress Notes (Signed)
Earl Thomas    [id number]    January 09, 1951  Primary Care Physician:Stallings, Earl Solomons, MD Date of Appointment: 11/05/2019 Established Patient Visit  Chief complaint:   Chief Complaint  Patient presents with  . Follow-up    ILD.  Good days and bad days.  Pollen causing sneezing and some dry cough.  Diarrhea, nausea and wt loss since starting Ofev.  Diarrhea/nausea comes and goes.    HPI: Earl Thomas is a 69 y.o. gentleman with history of RA-ILD.   Interval Updates: Here today for follow up for ILD. Has been on Ofev since March. He is on 150 mg BID.  Lost 15 lbs since starting Ofev Having nausea, vomiting and diarrhea.  Still on symbicort BID  On methotrexate oral. Still getting simponi infusions.  No reflux or heart burn. He is coughing daily.  He is having runny nose, sore throat.Taking xyzal at night, switched to zyrtec.   I have reviewed the patient's family social and past medical history and updated as appropriate.   Past Medical History:  Diagnosis Date  . Arthritis   . Glucose intolerance (impaired glucose tolerance)   . Hyperlipidemia   . 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    Past Surgical History:  Procedure Laterality Date  . ROTATOR CUFF REPAIR Right 01/02/16    Family History  Problem Relation Age of Onset  . Cancer Father 17       prostate cancer  . Diabetes Father   . Heart disease Father 31       AMI late 73s  . Cancer Mother 70       Breast cancer  . Heart disease Mother        CABG at age 67  . Diabetes Brother   . Heart disease Brother        AMI x 2; CABG  . Hyperlipidemia Brother     Social History   Occupational History  . Occupation: Professor  Tobacco Use  . Smoking status: Former Smoker    Years: 10.00    Types: Pipe    Quit date: 10/06/1988    Years since quitting: 31.1  . Smokeless tobacco: Never Used  . Tobacco comment: parent smoked in home as a child.  Smoked a pip in college,  not daily.  Substance and Sexual Activity  . Alcohol use: No  . Drug use: No  . Sexual activity: Yes    Review of systems: Constitutional: No fevers, chills, night sweats. +weight loss CV: No chest pain, or palpitations. Resp: No hemoptysis.  Physical Exam: Blood pressure 90/66, temperature 97.8 F (36.6 C), temperature source Temporal, height 5' 5" (1.651 m), weight 128 lb 12.8 oz (58.4 kg), SpO2 98 %.  Gen:      No acute distress, thin, well-appearing. Lungs:    No increased respiratory effort, symmetric chest wall excursion, bibasilar crackles CV:         Regular rate and rhythm; no murmurs, rubs, or gallops.  No pedal edema   Data Reviewed: Imaging: I have personally reviewed the CT scan from Nov 2020 which demonstrates bibasilar honeycombing consistent with UIP pattern.  PFTs:  PFT Results Latest Ref Rng & Units 07/26/2019  FVC-Pre L 1.52  FVC-Predicted Pre % 47  FVC-Post L 1.82  FVC-Predicted Post % 57  Pre FEV1/FVC % % 100  Post FEV1/FCV % % 88  FEV1-Pre L 1.52  FEV1-Predicted Pre % 63  FEV1-Post L 1.61  DLCO UNC% % 52  DLCO COR %Predicted % 82  TLC L 3.42  TLC % Predicted % 57  RV % Predicted % 84   I have personally reviewed the patient's PFTs and which show moderate restriction with an FEV1 63% of predicted and FVC 47% of predicted.   Labs: I have reviewed his outside hospital labs noted on September 15 progress note, his CCP level was greater than 255, Rheumatoid factor was 149 his, ESR was 109 his uric acid was normal  LFTs reviewed - no significant transaminitis on ofev.    Immunization status: Immunization History  Administered Date(s) Administered  . Influenza, High Dose Seasonal PF 04/24/2019  . Influenza,inj,Quad PF,6+ Mos 05/30/2015, 04/23/2016, 05/14/2017, 03/25/2018  . Influenza-Unspecified 04/07/2014  . PFIZER SARS-COV-2 Vaccination 08/09/2019, 08/30/2019  . Pneumococcal Conjugate-13 05/18/2018  . Pneumococcal Polysaccharide-23 06/09/2019   . Tdap 02/13/2009, 05/14/2017  . Zoster 04/12/2014  . Zoster Recombinat (Shingrix) 05/27/2018    Assessment:  Interstitial Lung Disease - UIP related to: Rheumatoid Arthritis Weight loss Allergic Rhinitis Worsening cough, likely secondary to GERD.   Plan/Recommendations: Having adverse effects to ofev with nausea, diarrhea.   Reduce Ofev dosing to 150 mg once daily due to side effects and weight loss. I encouraged him to try boost and Ensure's in between meals to help increase his nutritional intake. His rhinitis he should continue antihistamine and fluticasone intranasal. We will do a trial of proton pump inhibitor to see if that helps his persistent cough. He is continue to follow [facility] for rheumatology and getting methotrexate and Simponi infusions.  I will get some records from the group today.  Return to Care: Return in about 3 months (around 02/04/2020).   Lenice Llamas, MD Pulmonary and [place]

## 2019-11-09 ENCOUNTER — Telehealth: Payer: Self-pay | Admitting: Internal Medicine

## 2019-11-09 DIAGNOSIS — J849 Interstitial pulmonary disease, unspecified: Secondary | ICD-10-CM

## 2019-11-09 MED ORDER — OFEV 150 MG PO CAPS: 150.0000 mg | ORAL_CAPSULE | Freq: Every day | ORAL | 2 refills | Status: DC

## 2019-11-09 NOTE — Telephone Encounter (Signed)
Called [facility].  Pharmacist requested Ofev clarification.  Pharmacist stated Patient had told them of prescription change. New Ofev instructions, Ofev 128m daily given.  Nothing further at this time.   Last OV with Dr DShearon Stalls 11/05/19- The patient should have follow up scheduled in 3 months with APP.   Prior to next visit patient should have: Spirometry prior to next appointment in 3 months.   Reduce Ofev to 150 mg once a day.  Start drinking boost/ensure. Smaller more frequent meals will be more helpful.   Start taking pantoprazole once a day for your cough which may be related to stomach reflux.

## 2019-11-12 DIAGNOSIS — M0579 Rheumatoid arthritis with rheumatoid factor of multiple sites without organ or systems involvement: Secondary | ICD-10-CM | POA: Diagnosis not present

## 2019-12-14 DIAGNOSIS — R5382 Chronic fatigue, unspecified: Secondary | ICD-10-CM | POA: Diagnosis not present

## 2019-12-14 DIAGNOSIS — M0579 Rheumatoid arthritis with rheumatoid factor of multiple sites without organ or systems involvement: Secondary | ICD-10-CM | POA: Diagnosis not present

## 2019-12-14 DIAGNOSIS — Z6821 Body mass index (BMI) 21.0-21.9, adult: Secondary | ICD-10-CM | POA: Diagnosis not present

## 2019-12-14 DIAGNOSIS — M2550 Pain in unspecified joint: Secondary | ICD-10-CM | POA: Diagnosis not present

## 2020-01-07 DIAGNOSIS — Z79899 Other long term (current) drug therapy: Secondary | ICD-10-CM | POA: Diagnosis not present

## 2020-01-07 DIAGNOSIS — M0579 Rheumatoid arthritis with rheumatoid factor of multiple sites without organ or systems involvement: Secondary | ICD-10-CM | POA: Diagnosis not present

## 2020-01-07 DIAGNOSIS — Z111 Encounter for screening for respiratory tuberculosis: Secondary | ICD-10-CM | POA: Diagnosis not present

## 2020-01-25 ENCOUNTER — Other Ambulatory Visit (HOSPITAL_COMMUNITY)
Admission: RE | Admit: 2020-01-25 | Discharge: 2020-01-25 | Disposition: A | Discharge status: Home / Self Care | Payer: Medicare Other | Source: Ambulatory Visit | Attending: Internal Medicine | Admitting: Internal Medicine

## 2020-01-25 DIAGNOSIS — Z01812 Encounter for preprocedural laboratory examination: Secondary | ICD-10-CM | POA: Insufficient documentation

## 2020-01-25 DIAGNOSIS — Z20822 Contact with and (suspected) exposure to covid-19: Secondary | ICD-10-CM | POA: Insufficient documentation

## 2020-01-25 LAB — SARS CORONAVIRUS 2 (TAT 6-24 HRS): SARS Coronavirus 2: NEGATIVE

## 2020-01-27 DIAGNOSIS — M2550 Pain in unspecified joint: Secondary | ICD-10-CM | POA: Diagnosis not present

## 2020-01-27 DIAGNOSIS — Z6821 Body mass index (BMI) 21.0-21.9, adult: Secondary | ICD-10-CM | POA: Diagnosis not present

## 2020-01-27 DIAGNOSIS — Z79899 Other long term (current) drug therapy: Secondary | ICD-10-CM | POA: Diagnosis not present

## 2020-01-27 DIAGNOSIS — M0579 Rheumatoid arthritis with rheumatoid factor of multiple sites without organ or systems involvement: Secondary | ICD-10-CM | POA: Diagnosis not present

## 2020-01-27 DIAGNOSIS — J849 Interstitial pulmonary disease, unspecified: Secondary | ICD-10-CM | POA: Diagnosis not present

## 2020-01-28 ENCOUNTER — Encounter: Payer: Self-pay | Admitting: Primary Care

## 2020-01-28 ENCOUNTER — Ambulatory Visit: Payer: Medicare Other

## 2020-01-28 ENCOUNTER — Other Ambulatory Visit: Payer: Self-pay

## 2020-01-28 ENCOUNTER — Ambulatory Visit (INDEPENDENT_AMBULATORY_CARE_PROVIDER_SITE_OTHER): Payer: Medicare Other | Admitting: Primary Care

## 2020-01-28 DIAGNOSIS — M0510 Rheumatoid lung disease with rheumatoid arthritis of unspecified site: Secondary | ICD-10-CM | POA: Diagnosis not present

## 2020-01-28 DIAGNOSIS — J849 Interstitial pulmonary disease, unspecified: Secondary | ICD-10-CM

## 2020-01-28 LAB — NITRIC OXIDE: 11800305105: 41

## 2020-01-28 MED ORDER — BENZONATATE 200 MG PO CAPS: 200.0000 mg | ORAL_CAPSULE | Freq: Three times a day (TID) | ORAL | 3 refills | Status: DC | PRN

## 2020-01-28 NOTE — Patient Instructions (Addendum)
Office testing: - Spirometry testing today did show 18% decrease in lung function-unclear if this is due to worsening fibrosis or possible underlying asthma - FeNO was elevated at 41- underlying asthma  Recommendations: - Continue Symbicort 2 puffs twice daily (rinse mouth after use) - Continue to stay as active as possible - Plan check high-resolution CAT scan to ensure lung fibrosis has not progressed - If fibrosis has progressed recommend increasing Ofev to 100 mg twice daily and will need to talk to her rheumatologist about changing methotrexate to another medication to manage your arthritis  Orders: HRCT in 1 week re:ILD (ordered)  Follow-up 3 months with Dr. Shearon Stalls or first available with her

## 2020-01-28 NOTE — Progress Notes (Signed)
_0  ID: Earl Thomas, male    DOB: 1951/05/04, 69 y.o.   MRN: [id number]  Chief Complaint  Patient presents with  . Follow-up    ILD    Referring provider: Forrest Moron, MD  HPI: 69 year old male, former smoker.  Past medical history significant for rheumatoid arthritis, ILD, seasonal allergic rhintiis.  Patient of Dr. Shearon Stalls, last seen on 11/05/19. Patient is on  OFEV 16m daily, Symbicort 80 two puffs twice daily and methotrexate 10 mg once weekly.   01/28/2020 Patient presents today for 358-monthor ILD.  He is doing well with no new complaints. He has some mild dyspnea on exertion which causes him to need to sit down. It takes him 5-1096m to recover. He has chronic dry cough. He uses Symbicort 80 two puffs twice daily.  He rarely requires use of his albuterol rescue inhaler.  He is compliant with Ofev 150 daily. Appetite is getting better. He gained 3 lbs. Drinking Boost twice a day. He has been on methotrexate since Sept 2020. He saw Dr. HawTrudie Reedth Rheumatology yesterday. Methotrexate increased to 37m59mce weekly. Denies chest tightness or wheezing.   Pulmonary function testing: 01/28/2020 Spirometry-FVC 1.5 (46%), FEV1 1.2 (48%), ratio 79, +10% p BD   01/28/2020 FeNO 41  No Known Allergies  Immunization History  Administered Date(s) Administered  . Influenza, High Dose Seasonal PF 04/24/2019  . Influenza,inj,Quad PF,6+ Mos 05/30/2015, 04/23/2016, 05/14/2017, 03/25/2018  . Influenza-Unspecified 04/07/2014  . PFIZER SARS-COV-2 Vaccination 08/09/2019, 08/30/2019  . Pneumococcal Conjugate-13 05/18/2018  . Pneumococcal Polysaccharide-23 06/09/2019  . Tdap 02/13/2009, 05/14/2017  . Zoster 04/12/2014  . Zoster Recombinat (Shingrix) 05/27/2018    Past Medical History:  Diagnosis Date  . Arthritis   . Glucose intolerance (impaired glucose tolerance)   . Hyperlipidemia   . Pituitary macroadenoma ([facility])[place]31/2008   s/p NS consultation/[facility].    Tobacco  History: Social History   Tobacco Use  Smoking Status Former Smoker  . Years: 10.00  . Types: Pipe  . Quit date: 10/06/1988  . Years since quitting: 31.3  Smokeless Tobacco Never Used  Tobacco Comment   parent smoked in home as a child.  Smoked a pip in college, not daily.   Counseling given: Not Answered Comment: parent smoked in home as a child.  Smoked a pip in college, not daily.   Outpatient Medications Prior to Visit  Medication Sig Dispense Refill  . albuterol (VENTOLIN HFA) 108 (90 Base) MCG/ACT inhaler Inhale 2 puffs into the lungs every 6 (six) hours as needed for wheezing or shortness of breath. 18 g 1  . aspirin EC 81 MG tablet Take 81 mg by mouth daily.    . atMarland Kitchenrvastatin (LIPITOR) 20 MG tablet Take 1 tablet (20 mg total) by mouth daily at 6 PM. 90 tablet 3  . budesonide-formoterol (SYMBICORT) 80-4.5 MCG/ACT inhaler Inhale 2 puffs into the lungs 2 (two) times daily. 1 Inhaler 3  . diclofenac sodium (VOLTAREN) 1 % GEL Apply 2 g topically 4 (four) times daily. 100 g 1  . fluticasone (FLONASE) 50 MCG/ACT nasal spray Place 1 spray into both nostrils daily. 16 g 2  . folic acid ([name]) 1 MG tablet Take 1 mg by mouth daily.    . Golimumab (SIMPM[place]itaA IV) Inject into the vein. Every 8 weeks infusion    . hydrocortisone (PROCTOSOL HC) 2.5 % rectal cream APPLY ONE APPLICATORFUL RECTALLY TWICE DAILY as needed for hemorrhoid pain/itching/bleeding 29 g 3  . loperamide (IMODIUM) 2 MG capsule  Take 2 mg by mouth as needed for diarrhea or loose stools.    . meloxicam (MOBIC) 7.5 MG tablet Take 1 tablet (7.5 mg total) by mouth daily as needed for pain. 30 tablet 3  . methotrexate (RHEUMATREX) 10 MG tablet Take 10 mg by mouth once a week. Caution: Chemotherapy. Protect from light.    . Methotrexate Sodium (METHOTREXATE, PF,) 50 MG/2ML injection 20 mg once a week.    . METHOTREXATE SODIUM PO Take 60 mg by mouth once a week. 6 tabs weekly    . Nintedanib (OFEV) 150 MG CAPS Take 1 capsule  (150 mg total) by mouth daily. 30 capsule 2  . pantoprazole (PROTONIX) 40 MG tablet Take 1 tablet (40 mg total) by mouth daily. 30 tablet 11  . predniSONE (STERAPRED UNI-PAK 48 TAB) 5 MG (48) TBPK tablet Take 1 Package by mouth as directed.    . tadalafil (ADCIRCA/CIALIS) 20 MG tablet Take 0.5 tablets (10 mg total) by mouth every other day as needed for erectile dysfunction. 5 tablet 3   No facility-administered medications prior to visit.    Review of Systems  Review of Systems  Constitutional: Negative.   HENT: Negative.   Respiratory: Positive for cough. Negative for chest tightness, shortness of breath and wheezing.   Cardiovascular: Negative.     Physical Exam  BP (!) 100/60 (BP Location: Left Arm, Cuff Size: Normal)   Pulse 87   Temp (!) 97.3 F (36.3 C) (Oral)   Ht _0  (1.651 m)   Wt 131 lb 6.4 oz (59.6 kg)   SpO2 98%   BMI 21.87 kg/m  Physical Exam Constitutional:      Appearance: Normal appearance.     Comments: Thin adult male; no distress  HENT:     Head: Normocephalic and atraumatic.     Mouth/Throat:     Mouth: Mucous membranes are moist.     Pharynx: Oropharynx is clear.  Cardiovascular:     Rate and Rhythm: Normal rate and regular rhythm.  Pulmonary:     Effort: Pulmonary effort is normal. No respiratory distress.     Breath sounds: Rales present. No wheezing.     Comments: Coarse rales at bilateral bases  Musculoskeletal:        General: Normal range of motion.  Skin:    General: Skin is warm and dry.  Neurological:     General: No focal deficit present.     Mental Status: He is alert and oriented to person, place, and time. Mental status is at baseline.  Psychiatric:        Mood and Affect: Mood normal.        Behavior: Behavior normal.        Thought Content: Thought content normal.        Judgment: Judgment normal.      Lab Results:  CBC    Component Value Date/Time   WBC 4.8 11/12/2017 1001   WBC 5.2 04/23/2016 1619   RBC 5.28  11/12/2017 1001   RBC 5.56 04/23/2016 1619   HGB 14.8 11/12/2017 1001   HCT 45.3 11/12/2017 1001   PLT 197 11/12/2017 1001   MCV 86 11/12/2017 1001   MCH 28.0 11/12/2017 1001   MCH 27.9 04/23/2016 1619   MCHC 32.7 11/12/2017 1001   MCHC 32.9 04/23/2016 1619   RDW 14.5 11/12/2017 1001   LYMPHSABS 1.7 11/12/2017 1001   MONOABS 416 04/23/2016 1619   EOSABS 0.2 11/12/2017 1001   BASOSABS 0.0 11/12/2017  1001    BMET    Component Value Date/Time   NA 139 07/16/2019 1005   K 4.2 07/16/2019 1005   CL 101 07/16/2019 1005   CO2 22 07/16/2019 1005   GLUCOSE 114 (H) 07/16/2019 1005   GLUCOSE 87 04/23/2016 1619   BUN 14 07/16/2019 1005   CREATININE 0.94 07/16/2019 1005   CREATININE 1.21 04/23/2016 1619   CALCIUM 9.4 07/16/2019 1005   GFRNONAA 84 07/16/2019 1005   GFRNONAA 60 01/11/2016 1431   GFRAA 97 07/16/2019 1005   GFRAA 70 01/11/2016 1431    BNP No results found for: BNP  ProBNP No results found for: PROBNP  Imaging: No results found.   Assessment & Plan:   ILD (interstitial lung disease) ([place]) - Respiratory symptoms are stable, patient is largely asymptomatic except for a dry cough - Spirometry showed decrease 18% in FEV1 since January 2020 - Continue Symbicort 2 puffs twice daily (rinse mouth after use) - Continue to stay as active as possible - Plan: check high-resolution CAT scan to ensure lung fibrosis has not progressed - If fibrosis has progressed recommend increasing Ofev to 100 mg twice daily and will need to talk to her rheumatologist about changing methotrexate to another medication to manage your arthritis  Rheumatoid lung disease with rheumatoid arthritis ([place]) - Patient follows with Dr. Hawkes/Rheumatology- seen yesterday - Methotrexate was increased to 7m weekly - Concern for worsening underlying RA- ILD d/t decrease in lung function. We are checking HRCT, if ILD has progressed will need to discuss coming off Methotrexate      EMartyn Ehrich NP 01/28/2020

## 2020-01-28 NOTE — Assessment & Plan Note (Signed)
-  Patient follows with Dr. Hawkes/Rheumatology- seen yesterday - Methotrexate was increased to 12m weekly - Concern for worsening underlying RA- ILD d/t decrease in lung function. We are checking HRCT, if ILD has progressed will need to discuss coming off Methotrexate

## 2020-01-28 NOTE — Progress Notes (Signed)
Reviewed and agree with assessment/plan.   Chesley Mires, MD [facility] Pulmonary/Critical Care 01/28/2020, 12:18 PM Pager:  [phone]

## 2020-01-28 NOTE — Assessment & Plan Note (Signed)
-  Respiratory symptoms are stable, patient is largely asymptomatic except for a dry cough - Spirometry showed decrease 18% in FEV1 since January 2020 - Continue Symbicort 2 puffs twice daily (rinse mouth after use) - Continue to stay as active as possible - Plan: check high-resolution CAT scan to ensure lung fibrosis has not progressed - If fibrosis has progressed recommend increasing Ofev to 100 mg twice daily and will need to talk to her rheumatologist about changing methotrexate to another medication to manage your arthritis

## 2020-01-31 ENCOUNTER — Other Ambulatory Visit: Payer: Self-pay | Admitting: Internal Medicine

## 2020-01-31 DIAGNOSIS — J849 Interstitial pulmonary disease, unspecified: Secondary | ICD-10-CM

## 2020-02-09 ENCOUNTER — Ambulatory Visit (HOSPITAL_BASED_OUTPATIENT_CLINIC_OR_DEPARTMENT_OTHER)
Admission: RE | Admit: 2020-02-09 | Discharge: 2020-02-09 | Disposition: A | Discharge status: Home / Self Care | Payer: Medicare Other | Source: Ambulatory Visit | Attending: Primary Care | Admitting: Primary Care

## 2020-02-09 ENCOUNTER — Other Ambulatory Visit: Payer: Self-pay

## 2020-02-09 DIAGNOSIS — J8410 Pulmonary fibrosis, unspecified: Secondary | ICD-10-CM | POA: Diagnosis not present

## 2020-02-09 DIAGNOSIS — J849 Interstitial pulmonary disease, unspecified: Secondary | ICD-10-CM | POA: Diagnosis not present

## 2020-02-09 DIAGNOSIS — J479 Bronchiectasis, uncomplicated: Secondary | ICD-10-CM | POA: Diagnosis not present

## 2020-02-11 ENCOUNTER — Other Ambulatory Visit: Payer: Self-pay | Admitting: Internal Medicine

## 2020-02-11 DIAGNOSIS — J849 Interstitial pulmonary disease, unspecified: Secondary | ICD-10-CM

## 2020-02-14 NOTE — Progress Notes (Signed)
Please let patient know CT chest showed pulmonary fibrosis, not significantly changed since previous imaging in Nov 2020. Please make sure he has follow-up scheduled in the next 3 months with either MR or [place]

## 2020-03-01 ENCOUNTER — Telehealth: Payer: Self-pay | Admitting: Internal Medicine

## 2020-03-01 NOTE — Telephone Encounter (Signed)
Tried calling pt- no answer- LMTCB

## 2020-03-03 DIAGNOSIS — M0579 Rheumatoid arthritis with rheumatoid factor of multiple sites without organ or systems involvement: Secondary | ICD-10-CM | POA: Diagnosis not present

## 2020-03-06 ENCOUNTER — Encounter: Payer: Self-pay | Admitting: *Deleted

## 2020-03-06 NOTE — Telephone Encounter (Signed)
Letter has been written and sent to pt's mychart. Called and spoke with pt letting him know that the letter was written and he verbalized understanding. Nothing further needed.

## 2020-03-06 NOTE — Telephone Encounter (Signed)
Called and spoke with pt. 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  Beth, please advise if you are okay with [place] writing a letter and sending it to pt via his mychart.

## 2020-03-06 NOTE — Telephone Encounter (Signed)
Pt returning a phone call for a letter for work. Pt can b e reached at [phone]

## 2020-03-06 NOTE — Telephone Encounter (Signed)
Yes that is fine. He has hx rheumatoid lung disease, ILD, asthma

## 2020-03-07 ENCOUNTER — Other Ambulatory Visit: Payer: Self-pay

## 2020-03-07 ENCOUNTER — Other Ambulatory Visit: Payer: Medicare Other

## 2020-03-07 DIAGNOSIS — Z20822 Contact with and (suspected) exposure to covid-19: Secondary | ICD-10-CM

## 2020-03-08 LAB — NOVEL CORONAVIRUS, NAA: SARS-CoV-2, NAA: NOT DETECTED

## 2020-03-16 DIAGNOSIS — Z87891 Personal history of nicotine dependence: Secondary | ICD-10-CM | POA: Diagnosis not present

## 2020-03-16 DIAGNOSIS — M26621 Arthralgia of right temporomandibular joint: Secondary | ICD-10-CM | POA: Diagnosis not present

## 2020-03-16 DIAGNOSIS — J84112 Idiopathic pulmonary fibrosis: Secondary | ICD-10-CM | POA: Diagnosis not present

## 2020-03-28 ENCOUNTER — Other Ambulatory Visit: Payer: Medicare Other

## 2020-03-28 DIAGNOSIS — Z20822 Contact with and (suspected) exposure to covid-19: Secondary | ICD-10-CM | POA: Diagnosis not present

## 2020-03-30 LAB — NOVEL CORONAVIRUS, NAA: SARS-CoV-2, NAA: NOT DETECTED

## 2020-03-30 LAB — SARS-COV-2, NAA 2 DAY TAT

## 2020-04-11 DIAGNOSIS — Z23 Encounter for immunization: Secondary | ICD-10-CM | POA: Diagnosis not present

## 2020-04-25 DIAGNOSIS — J849 Interstitial pulmonary disease, unspecified: Secondary | ICD-10-CM | POA: Diagnosis not present

## 2020-04-25 DIAGNOSIS — Z79899 Other long term (current) drug therapy: Secondary | ICD-10-CM | POA: Diagnosis not present

## 2020-04-25 DIAGNOSIS — Z6821 Body mass index (BMI) 21.0-21.9, adult: Secondary | ICD-10-CM | POA: Diagnosis not present

## 2020-04-25 DIAGNOSIS — M2550 Pain in unspecified joint: Secondary | ICD-10-CM | POA: Diagnosis not present

## 2020-04-25 DIAGNOSIS — M26621 Arthralgia of right temporomandibular joint: Secondary | ICD-10-CM | POA: Diagnosis not present

## 2020-04-25 DIAGNOSIS — M0579 Rheumatoid arthritis with rheumatoid factor of multiple sites without organ or systems involvement: Secondary | ICD-10-CM | POA: Diagnosis not present

## 2020-04-27 ENCOUNTER — Encounter: Payer: Self-pay | Admitting: Internal Medicine

## 2020-04-27 ENCOUNTER — Ambulatory Visit (INDEPENDENT_AMBULATORY_CARE_PROVIDER_SITE_OTHER): Payer: Medicare Other | Admitting: Internal Medicine

## 2020-04-27 ENCOUNTER — Other Ambulatory Visit: Payer: Self-pay

## 2020-04-27 VITALS — BP 109/74 | HR 96 | Temp 97.2°F | Ht 65.0 in | Wt 130.1 lb

## 2020-04-27 DIAGNOSIS — M0579 Rheumatoid arthritis with rheumatoid factor of multiple sites without organ or systems involvement: Secondary | ICD-10-CM

## 2020-04-27 DIAGNOSIS — J301 Allergic rhinitis due to pollen: Secondary | ICD-10-CM | POA: Diagnosis not present

## 2020-04-27 DIAGNOSIS — J849 Interstitial pulmonary disease, unspecified: Secondary | ICD-10-CM | POA: Diagnosis not present

## 2020-04-27 DIAGNOSIS — Z23 Encounter for immunization: Secondary | ICD-10-CM | POA: Diagnosis not present

## 2020-04-27 MED ORDER — MONTELUKAST SODIUM 10 MG PO TABS: 10.0000 mg | ORAL_TABLET | Freq: Every day | ORAL | 11 refills | Status: DC

## 2020-04-27 NOTE — Progress Notes (Signed)
Earl Thomas    [id number]    16-Aug-1950  Primary Care Physician:Stallings, Earl Solomons, MD Date of Appointment: 04/27/2020 Established Patient Visit  Chief complaint:   Chief Complaint  Patient presents with  . Consult    mostly dry cough, times where the mucus is yellow-brown.  sob with exertion going up steps.    HPI: Earl Thomas is a 69 y.o. gentleman with history of RA-ILD.   Interval Updates: Here today for follow up for ILD. Has been on Ofev since March. He is on 150 mg BID.  Lost 15 lbs since starting Ofev Still having nausea, vomiting and diarrhea.  Still on symbicort BID and does feel that this helps his breathing.   On methotrexate oral - this dose was increased due to worsening joint pains. Still getting simponi infusions - every 8 weeks. Next one is tomorrow.   Has early morning cough with yellowish phlegm. Still has drainage.   I have reviewed the patient's family social and past medical history and updated as appropriate.   Past Medical History:  Diagnosis Date  . Arthritis   . Glucose intolerance (impaired glucose tolerance)   . Hyperlipidemia   . 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    Past Surgical History:  Procedure Laterality Date  . ROTATOR CUFF REPAIR Right 01/02/16    Family History  Problem Relation Age of Onset  . Cancer Father 46       prostate cancer  . Diabetes Father   . Heart disease Father 31       AMI late 67s  . Cancer Mother 25       Breast cancer  . Heart disease Mother        CABG at age 62  . Diabetes Brother   . Heart disease Brother        AMI x 2; CABG  . Hyperlipidemia Brother     Social History   Occupational History  . Occupation: Professor  Tobacco Use  . Smoking status: Former Smoker    Years: 10.00    Types: Pipe    Quit date: 10/06/1988    Years since quitting: 31.5  . Smokeless tobacco: Never Used  . Tobacco comment: parent smoked in home as a child.  Smoked a  pip in college, not daily.  Vaping Use  . Vaping Use: Never used  Substance and Sexual Activity  . Alcohol use: No  . Drug use: No  . Sexual activity: Yes    Physical Exam: Blood pressure 109/74, pulse 96, temperature (!) 97.2 F (36.2 C), temperature source Temporal, height _0  (1.651 m), weight 130 lb 1.9 oz (59 kg), SpO2 99 %.  Gen:      No acute distress, thin, well-appearing. Lungs:    No increased respiratory effort, symmetric chest wall excursion, bibasilar crackles CV:         Regular rate and rhythm; no murmurs, rubs, or gallops.  No pedal edema MSK:    Swelling of third digits bilaterally at the MCP joints   Data Reviewed: Imaging: I have personally reviewed the CT scan from Nov 2020 which demonstrates bibasilar honeycombing consistent with UIP pattern.  PFTs:  PFT Results Latest Ref Rng & Units 07/26/2019  FVC-Pre L 1.52  FVC-Predicted Pre % 47  FVC-Post L 1.82  FVC-Predicted Post % 57  Pre FEV1/FVC % % 100  Post FEV1/FCV % % 88  FEV1-Pre L 1.52  FEV1-Predicted  Pre % 63  FEV1-Post L 1.61  DLCO uncorrected ml/min/mmHg 11.79  DLCO UNC% % 52  DLVA Predicted % 82  TLC L 3.42  TLC % Predicted % 57  RV % Predicted % 84   I have personally reviewed the patient's PFTs and which show moderate restriction with an FEV1 63% of predicted and FVC 47% of predicted.   Labs: I have reviewed his outside hospital labs noted on March 23 2019 progress note, his CCP level was greater than 255, Rheumatoid factor was 149 his, ESR was 109 his uric acid was normal. Reviewed outside records from Dr. Trudie Thomas' office in rheumatology  LFTs reviewed - no significant transaminitis on ofev.    Immunization status: Immunization History  Administered Date(s) Administered  . Fluad Quad(high Dose 65+) 04/13/2020  . Influenza, High Dose Seasonal PF 04/24/2019  . Influenza,inj,Quad PF,6+ Mos 05/30/2015, 04/23/2016, 05/14/2017, 03/25/2018  . Influenza-Unspecified 04/07/2014  . PFIZER  SARS-COV-2 Vaccination 08/09/2019, 08/30/2019  . Pneumococcal Conjugate-13 05/18/2018  . Pneumococcal Polysaccharide-23 06/09/2019  . Tdap 02/13/2009, 05/14/2017  . Zoster 04/12/2014  . 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    Plan/Recommendations: Having adverse effects to ofev with nausea, diarrhea.  Despite reduced dose. Dr. Trudie Thomas is considering switching him off methotrexate to another agent if the increased dose does not control his arthralgias. I would favor imuran at this point which would have some lung involvement. May also allow him to come off the Ofev.  His rhinitis he should continue antihistamine and fluticasone intranasal. Add singulair.  Stop PPI today as no improvement in cough  Repeat spirometry at next visit  Return to Care: Return in about 3 months (around 07/28/2020).   Earl Llamas, MD [facility] [place]

## 2020-04-27 NOTE — Patient Instructions (Signed)
The patient should have follow up scheduled with myself in 3 months.   Prior to next visit patient should have: Spirometry/DLCO  Stop taking pantoprazole.   Start taking singulair for your drainage and see if this helps. Take it in the morning - let me know if you have any side effects such as bad dreams.   Continue flonase and zyrtec  Continue Ofev for now. We can talk with Dr. Trudie Reed about switching to another medication after your follow up with her.

## 2020-04-28 DIAGNOSIS — M0579 Rheumatoid arthritis with rheumatoid factor of multiple sites without organ or systems involvement: Secondary | ICD-10-CM | POA: Diagnosis not present

## 2020-05-09 ENCOUNTER — Other Ambulatory Visit: Payer: Medicare Other

## 2020-05-09 DIAGNOSIS — Z20822 Contact with and (suspected) exposure to covid-19: Secondary | ICD-10-CM

## 2020-05-10 LAB — NOVEL CORONAVIRUS, NAA: SARS-CoV-2, NAA: NOT DETECTED

## 2020-05-10 LAB — SARS-COV-2, NAA 2 DAY TAT

## 2020-05-24 DIAGNOSIS — Z86010 Personal history of colonic polyps: Secondary | ICD-10-CM | POA: Diagnosis not present

## 2020-05-24 DIAGNOSIS — J8417 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: Secondary | ICD-10-CM | POA: Diagnosis not present

## 2020-05-24 DIAGNOSIS — M0570 Rheumatoid arthritis with rheumatoid factor of unspecified site without organ or systems involvement: Secondary | ICD-10-CM | POA: Diagnosis not present

## 2020-06-12 DIAGNOSIS — Z136 Encounter for screening for cardiovascular disorders: Secondary | ICD-10-CM | POA: Diagnosis not present

## 2020-06-12 DIAGNOSIS — Z125 Encounter for screening for malignant neoplasm of prostate: Secondary | ICD-10-CM | POA: Diagnosis not present

## 2020-06-13 ENCOUNTER — Other Ambulatory Visit: Payer: Medicare Other

## 2020-06-13 ENCOUNTER — Other Ambulatory Visit: Payer: Self-pay

## 2020-06-13 DIAGNOSIS — Z20822 Contact with and (suspected) exposure to covid-19: Secondary | ICD-10-CM

## 2020-06-14 LAB — SARS-COV-2, NAA 2 DAY TAT

## 2020-06-14 LAB — NOVEL CORONAVIRUS, NAA: SARS-CoV-2, NAA: NOT DETECTED

## 2020-06-22 DIAGNOSIS — Z86010 Personal history of colonic polyps: Secondary | ICD-10-CM | POA: Diagnosis not present

## 2020-06-22 DIAGNOSIS — K635 Polyp of colon: Secondary | ICD-10-CM | POA: Diagnosis not present

## 2020-06-22 DIAGNOSIS — D125 Benign neoplasm of sigmoid colon: Secondary | ICD-10-CM | POA: Diagnosis not present

## 2020-06-23 DIAGNOSIS — M0579 Rheumatoid arthritis with rheumatoid factor of multiple sites without organ or systems involvement: Secondary | ICD-10-CM | POA: Diagnosis not present

## 2020-06-26 DIAGNOSIS — E875 Hyperkalemia: Secondary | ICD-10-CM | POA: Diagnosis not present

## 2020-06-27 ENCOUNTER — Other Ambulatory Visit: Payer: Medicare Other

## 2020-06-27 DIAGNOSIS — Z20822 Contact with and (suspected) exposure to covid-19: Secondary | ICD-10-CM

## 2020-06-28 LAB — SARS-COV-2, NAA 2 DAY TAT

## 2020-06-28 LAB — NOVEL CORONAVIRUS, NAA: SARS-CoV-2, NAA: NOT DETECTED

## 2020-07-05 ENCOUNTER — Telehealth: Payer: Self-pay | Admitting: Internal Medicine

## 2020-07-05 NOTE — Telephone Encounter (Signed)
Spoke with pt, c/o sinus congestion, pnd, prod cough worse qam and qhs with white/yellow mucus, sob with exertion.  S/s present X2-3 months.    Pt denies fever, chest pain/tightness. Pt has received 2 covid vaccines and flu shot.   Pt has been taking singular qd, zyrtec qd, delsym.  Requesting something to help with PND.    Ph[facility]: [facility] on [address].    Sending to Dr. Valeta Harms since Dr. Shearon Stalls is not on schedule.  Please advise on recs, thanks!

## 2020-07-05 NOTE — Telephone Encounter (Signed)
Spoke with the pt and notified of response per Dr Valeta Harms  He verbalized understanding  I have scheduled him next available for 07/14/20

## 2020-07-05 NOTE — Telephone Encounter (Signed)
Please make appt with next available APP or Dr. Halford Chessman.   Garner Nash, DO [facility] 07/05/2020 3:59 PM

## 2020-07-11 ENCOUNTER — Other Ambulatory Visit: Payer: Medicare Other

## 2020-07-11 DIAGNOSIS — Z20822 Contact with and (suspected) exposure to covid-19: Secondary | ICD-10-CM

## 2020-07-13 LAB — NOVEL CORONAVIRUS, NAA: SARS-CoV-2, NAA: NOT DETECTED

## 2020-07-13 LAB — SARS-COV-2, NAA 2 DAY TAT

## 2020-07-14 ENCOUNTER — Other Ambulatory Visit: Payer: Self-pay

## 2020-07-14 ENCOUNTER — Encounter: Payer: Self-pay | Admitting: Pulmonary Disease

## 2020-07-14 ENCOUNTER — Ambulatory Visit (INDEPENDENT_AMBULATORY_CARE_PROVIDER_SITE_OTHER): Payer: Medicare Other | Admitting: Pulmonary Disease

## 2020-07-14 VITALS — BP 118/80 | HR 80 | Temp 97.7°F | Ht 65.0 in | Wt 131.0 lb

## 2020-07-14 DIAGNOSIS — J301 Allergic rhinitis due to pollen: Secondary | ICD-10-CM | POA: Diagnosis not present

## 2020-07-14 DIAGNOSIS — M0510 Rheumatoid lung disease with rheumatoid arthritis of unspecified site: Secondary | ICD-10-CM | POA: Diagnosis not present

## 2020-07-14 DIAGNOSIS — J0100 Acute maxillary sinusitis, unspecified: Secondary | ICD-10-CM | POA: Diagnosis not present

## 2020-07-14 DIAGNOSIS — J849 Interstitial pulmonary disease, unspecified: Secondary | ICD-10-CM | POA: Diagnosis not present

## 2020-07-14 MED ORDER — PREDNISONE 10 MG PO TABS: ORAL_TABLET | ORAL | 0 refills | Status: DC

## 2020-07-14 MED ORDER — DOXYCYCLINE HYCLATE 100 MG PO TABS: 100.0000 mg | ORAL_TABLET | Freq: Two times a day (BID) | ORAL | 0 refills | Status: DC

## 2020-07-14 NOTE — Assessment & Plan Note (Signed)
Plan: Continue Ofev Keep upcoming appointment with Dr. Shearon Stalls

## 2020-07-14 NOTE — Patient Instructions (Addendum)
You were seen today by Lauraine Rinne, NP  for:   1. Acute maxillary sinusitis, recurrence not specified  - predniSONE (DELTASONE) 10 MG tablet; Take 2 tablets (6m total) daily for the next 5 days. Take in the AM with food.  Dispense: 10 tablet; Refill: 0 - doxycycline (VIBRA-TABS) 100 MG tablet; Take 1 tablet (100 mg total) by mouth 2 (two) times daily.  Dispense: 14 tablet; Refill: 0  2. Seasonal allergic rhinitis due to pollen  Continue daily Singulair  Continue daily Flonase  Start  nasal saline rinses twice daily (Brand Neilmed) Use distilled water Shake well Get bottle lukewarm like a baby bottle   3. ILD (interstitial lung disease) ([place]  Continue Ofev  Keep follow-up with Dr. DShearon Stallslater on this month  4. Rheumatoid lung disease with rheumatoid arthritis ([place]  Continue methotrexate  Continue follow-up with rheumatology   We recommend today:  No orders of the defined types were placed in this encounter.  No orders of the defined types were placed in this encounter.  Meds ordered this encounter  Medications  . predniSONE (DELTASONE) 10 MG tablet    Sig: Take 2 tablets (250mtotal) daily for the next 5 days. Take in the AM with food.    Dispense:  10 tablet    Refill:  0  . doxycycline (VIBRA-TABS) 100 MG tablet    Sig: Take 1 tablet (100 mg total) by mouth 2 (two) times daily.    Dispense:  14 tablet    Refill:  0    Follow Up:    Return in about 24 days (around 08/07/2020), or if symptoms worsen or fail to improve, for Follow up with Dr. DeShearon Stalls  Notification of test results are managed in the following manner: If there are  any recommendations or changes to the  plan of care discussed in office today,  we will contact you and let you know what they are. If you do not hear from [facility] your results are normal and you can view them through your  MyChart account , or a letter will be sent to you. Thank you again for trusting [facility] your care  - Thank you,  [facility]    It is flu season:   >>> Best ways to protect herself from the flu: Receive the yearly flu vaccine, practice good hand hygiene washing with soap and also using hand sanitizer when available, eat a nutritious meals, get adequate rest, hydrate appropriately       Please contact the office if your symptoms worsen or you have concerns that you are not improving.   Thank you for choosing [facility] for your healthcare, and for allowing [facility] partner with you on your healthcare journey. I am thankful to be able to provide care to you today.   BrWyn QuakerNP-C

## 2020-07-14 NOTE — Progress Notes (Signed)
_0  ID: Earl Thomas, male    DOB: 1951/07/06, 70 y.o.   MRN: [id number]  Chief Complaint  Patient presents with  . Acute Visit    Sneezing, coughing and head congestion, mucous is yellow with spot of blood once for 1 month    Referring provider: Forrest Moron, MD  HPI:  70 year old male former smoker followed in our office for interstitial lung disease  PMH: Hyperlipidemia, rheumatoid arthritis Smoker/ Smoking History: Former smoker.  Quit 1990. Maintenance: Ofev, Symbicort 80 Pt of: Dr. Shearon Stalls  07/14/2020  - Visit   70 year old male former smoker followed in our office for interstitial lung disease.  He is established with Dr. Shearon Stalls.  Last seen by Dr. Shearon Stalls in October/2021.  Plan of care at that point in time was for the patient have a 6-monthfollow-up.  Continue to follow-up with rheumatology for management of rheumatoid arthritis.  Continue methotrexate.  It was being discussed that patient may switch from methotrexate to Imuran.  This decision was going to be made by Dr. HTrudie Reedhis rheumatologist.  He was encouraged to continue his daily antihistamine, add Singulair.  Continue Ofev at this time.  And to follow-up in 3 months.  Patient contact her office on 07/05/2020 reporting that he was having sinus congestion, postnasal drip and a productive cough is worse in the morning in the evening with white to yellow phlegm with shortness of breath.  Present for the last 2 to 3 months.  Patient reports adherence to Symbicort 80 inhaler.  He reports adherence to Singulair.  He continues to have thick nasal drainage.  Having occasional nosebleeds due to the significant amount of nasal drainage that he is having.  Patient denies acute fevers.  Patient has upcoming follow-up with rheumatology on 07/27/2020.  Has upcoming follow-up with Dr. DShearon Stallslater on this month on 08/07/2020  [facility]  He is tolerating this well.  He is working with primary care on  increasing his overall protein intake.  He is not having a significant amount diarrhea.He reports he is able to tolerate the Ofev at this time  Questionaires / Pulmonary Flowsheets:   ACT:  No flowsheet data found.  MMRC: mMRC Dyspnea Scale mMRC Score  11/05/2019 2  08/06/2019 1    [place]:  No flowsheet data found.  Tests:   FENO:  No results found for: NITRICOXIDE  PFT: PFT Results Latest Ref Rng & Units 07/26/2019  FVC-Pre L 1.52  FVC-Predicted Pre % 47  FVC-Post L 1.82  FVC-Predicted Post % 57  Pre FEV1/FVC % % 100  Post FEV1/FCV % % 88  FEV1-Pre L 1.52  FEV1-Predicted Pre % 63  FEV1-Post L 1.61  DLCO uncorrected ml/min/mmHg 11.79  DLCO UNC% % 52  DLVA Predicted % 82  TLC L 3.42  TLC % Predicted % 57  RV % Predicted % 84    WALK:  No flowsheet data found.  Imaging: No results found.  Lab Results:  CBC    Component Value Date/Time   WBC 4.8 11/12/2017 1001   WBC 5.2 04/23/2016 1619   RBC 5.28 11/12/2017 1001   RBC 5.56 04/23/2016 1619   HGB 14.8 11/12/2017 1001   HCT 45.3 11/12/2017 1001   PLT 197 11/12/2017 1001   MCV 86 11/12/2017 1001   MCH 28.0 11/12/2017 1001   MCH 27.9 04/23/2016 1619   MCHC 32.7 11/12/2017 1001   MCHC 32.9 04/23/2016 1619   RDW 14.5 11/12/2017 1001  LYMPHSABS 1.7 11/12/2017 1001   MONOABS 416 04/23/2016 1619   EOSABS 0.2 11/12/2017 1001   BASOSABS 0.0 11/12/2017 1001    BMET    Component Value Date/Time   NA 139 07/16/2019 1005   K 4.2 07/16/2019 1005   CL 101 07/16/2019 1005   CO2 22 07/16/2019 1005   GLUCOSE 114 (H) 07/16/2019 1005   GLUCOSE 87 04/23/2016 1619   BUN 14 07/16/2019 1005   CREATININE 0.94 07/16/2019 1005   CREATININE 1.21 04/23/2016 1619   CALCIUM 9.4 07/16/2019 1005   GFRNONAA 84 07/16/2019 1005   GFRNONAA 60 01/11/2016 1431   GFRAA 97 07/16/2019 1005   GFRAA 70 01/11/2016 1431    BNP No results found for: BNP  ProBNP No results found for: PROBNP  Specialty Problems       Pulmonary Problems   Seasonal allergic rhinitis due to pollen   ILD (interstitial lung disease) ([place])   Rheumatoid lung disease with rheumatoid arthritis ([place])   Acute maxillary sinusitis      Not on File  Immunization History  Administered Date(s) Administered  . Fluad Quad(high Dose 65+) 04/13/2020  . Influenza, High Dose Seasonal PF 04/24/2019  . Influenza,inj,Quad PF,6+ Mos 05/30/2015, 04/23/2016, 05/14/2017, 03/25/2018  . Influenza-Unspecified 04/07/2014  . PFIZER SARS-COV-2 Vaccination 08/09/2019, 08/30/2019  . Pneumococcal Conjugate-13 05/18/2018  . Pneumococcal Polysaccharide-23 06/09/2019  . Tdap 02/13/2009, 05/14/2017  . Zoster 04/12/2014  . Zoster Recombinat (Shingrix) 05/27/2018    Past Medical History:  Diagnosis Date  . Arthritis   . Glucose intolerance (impaired glucose tolerance)   . Hyperlipidemia   . 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    Tobacco History: Social History   Tobacco Use  Smoking Status Former Smoker  . Years: 10.00  . Types: Pipe  . Quit date: 10/06/1988  . Years since quitting: 31.7  Smokeless Tobacco Never Used  Tobacco Comment   parent smoked in home as a child.  Smoked a pip in college, not daily.   Counseling given: Not Answered Comment: parent smoked in home as a child.  Smoked a pip in college, not daily.   Continue to not smoke  Outpatient Encounter Medications as of 07/14/2020  Medication Sig  . albuterol (VENTOLIN HFA) 108 (90 Base) MCG/ACT inhaler Inhale 2 puffs into the lungs every 6 (six) hours as needed for wheezing or shortness of breath.  Marland Kitchen aspirin EC 81 MG tablet Take 81 mg by mouth daily.  Marland Kitchen atorvastatin (LIPITOR) 20 MG tablet Take 1 tablet (20 mg total) by mouth daily at 6 PM.  . benzonatate (TESSALON) 200 MG capsule Take 1 capsule (200 mg total) by mouth 3 (three) times daily as needed for cough.  . budesonide-formoterol (SYMBICORT) 80-4.5 MCG/ACT inhaler Inhale 2 puffs into the  lungs 2 (two) times daily.  . diclofenac sodium (VOLTAREN) 1 % GEL Apply 2 g topically 4 (four) times daily.  Marland Kitchen doxycycline (VIBRA-TABS) 100 MG tablet Take 1 tablet (100 mg total) by mouth 2 (two) times daily.  . fluticasone (FLONASE) 50 MCG/ACT nasal spray Place 1 spray into both nostrils daily.  . folic acid ([name]) 1 MG tablet Take 1 mg by mouth daily.  . Golimumab ([place] ARIA IV) Inject into the vein. Every 8 weeks infusion  . hydrocortisone (PROCTOSOL HC) 2.5 % rectal cream APPLY ONE APPLICATORFUL RECTALLY TWICE DAILY as needed for hemorrhoid pain/itching/bleeding  . loperamide (IMODIUM) 2 MG capsule Take 2 mg by mouth as needed for diarrhea or loose stools.  Marland Kitchen  meloxicam (MOBIC) 7.5 MG tablet Take 1 tablet (7.5 mg total) by mouth daily as needed for pain.  . methotrexate (RHEUMATREX) 10 MG tablet Take 10 mg by mouth once a week. Caution: Chemotherapy. Protect from light.  . Methotrexate Sodium (METHOTREXATE, PF,) 50 MG/2ML injection 20 mg once a week.  . METHOTREXATE SODIUM PO Take 60 mg by mouth once a week. 6 tabs weekly  . montelukast (SINGULAIR) 10 MG tablet Take 1 tablet (10 mg total) by mouth daily.  Marland Kitchen OFEV 150 MG CAPS TAKE 1 CAPSULE BY MOUTH ONCE DAILY WITH FOOD. CALL [phone] FOR REFILL.  Marland Kitchen predniSONE (DELTASONE) 10 MG tablet Take 2 tablets (32m total) daily for the next 5 days. Take in the AM with food.  . tadalafil (ADCIRCA/CIALIS) 20 MG tablet Take 0.5 tablets (10 mg total) by mouth every other day as needed for erectile dysfunction.   No facility-administered encounter medications on file as of 07/14/2020.     Review of Systems  Review of Systems  Constitutional: Positive for fatigue. Negative for activity change, chills, fever and unexpected weight change.  HENT: Positive for congestion (yellow ) and postnasal drip. Negative for rhinorrhea, sinus pressure, sinus pain and sore throat.   Eyes: Negative.   Respiratory: Positive for cough. Negative for shortness of  breath and wheezing.   Cardiovascular: Negative for chest pain and palpitations.  Gastrointestinal: Negative for constipation, diarrhea, nausea and vomiting.  Endocrine: Negative.   Genitourinary: Negative.   Musculoskeletal: Negative.   Skin: Negative.   Neurological: Negative for dizziness and headaches.  Psychiatric/Behavioral: Negative.  Negative for dysphoric mood. The patient is not nervous/anxious.   All other systems reviewed and are negative.    Physical Exam  BP 118/80 (BP Location: Left Arm, Cuff Size: Normal)   Pulse 80   Temp 97.7 F (36.5 C) (Oral)   Ht _0  (1.651 m)   Wt 131 lb (59.4 kg)   SpO2 98%   BMI 21.80 kg/m   Wt Readings from Last 5 Encounters:  07/14/20 131 lb (59.4 kg)  04/27/20 130 lb 1.9 oz (59 kg)  01/28/20 131 lb 6.4 oz (59.6 kg)  11/05/19 128 lb 12.8 oz (58.4 kg)  09/07/19 138 lb (62.6 kg)    BMI Readings from Last 5 Encounters:  07/14/20 21.80 kg/m  04/27/20 21.65 kg/m  01/28/20 21.87 kg/m  11/05/19 21.43 kg/m  09/07/19 22.96 kg/m     Physical Exam Vitals and nursing note reviewed.  Constitutional:      General: He is not in acute distress.    Appearance: Normal appearance. He is normal weight.  HENT:     Head: Normocephalic and atraumatic.     Right Ear: Hearing and external ear normal.     Left Ear: Hearing and external ear normal.     Nose: Congestion (Yellow dried mucus) and rhinorrhea present. No mucosal edema.     Right Turbinates: Not enlarged.     Left Turbinates: Not enlarged.     Mouth/Throat:     Mouth: Mucous membranes are dry.     Pharynx: Oropharynx is clear. No oropharyngeal exudate.  Eyes:     Pupils: Pupils are equal, round, and reactive to light.  Cardiovascular:     Rate and Rhythm: Normal rate and regular rhythm.     Pulses: Normal pulses.     Heart sounds: Normal heart sounds. No murmur heard.   Pulmonary:     Effort: Pulmonary effort is normal.     Breath sounds:  Rales (Bibasilar crackles)  present. No decreased breath sounds or wheezing.  Musculoskeletal:     Cervical back: Normal range of motion.     Right lower leg: No edema.     Left lower leg: No edema.  Lymphadenopathy:     Cervical: No cervical adenopathy.  Skin:    General: Skin is warm and dry.     Capillary Refill: Capillary refill takes less than 2 seconds.     Findings: No erythema or rash.  Neurological:     General: No focal deficit present.     Mental Status: He is alert and oriented to person, place, and time.     Motor: No weakness.     Coordination: Coordination normal.     Gait: Gait is intact. Gait normal.  Psychiatric:        Mood and Affect: Mood normal.        Behavior: Behavior normal. Behavior is cooperative.        Thought Content: Thought content normal.        Judgment: Judgment normal.       Assessment & Plan:   Acute maxillary sinusitis Plan: Doxycycline today Short course of prednisone Start nasal saline rinses  ILD (interstitial lung disease) ([place]) Plan: Continue Ofev Keep upcoming appointment with Dr. Shearon Stalls   Rheumatoid lung disease with rheumatoid arthritis ([place]) Plan: Continue follow-up with rheumatology Continue methotrexate As discussed at last office visit with Dr. Shearon Stalls could consider switching from methotrexate to Imuran Continue Ofev  Seasonal allergic rhinitis due to pollen Plan: Continue Zyrtec Continue Singulair Start nasal saline rinses    Return in about 24 days (around 08/07/2020), or if symptoms worsen or fail to improve, for Follow up with Dr. Shearon Stalls.   Lauraine Rinne, NP 07/14/2020   This appointment required 22 minutes of patient care (this includes precharting, chart review, review of results, face-to-face care, etc.).

## 2020-07-14 NOTE — Assessment & Plan Note (Signed)
Plan: Doxycycline today Short course of prednisone Start nasal saline rinses

## 2020-07-14 NOTE — Assessment & Plan Note (Signed)
Plan: Continue Zyrtec Continue Singulair Start nasal saline rinses

## 2020-07-14 NOTE — Assessment & Plan Note (Signed)
Plan: Continue follow-up with rheumatology Continue methotrexate As discussed at last office visit with Dr. Shearon Stalls could consider switching from methotrexate to Imuran Continue Ofev

## 2020-07-27 DIAGNOSIS — Z79899 Other long term (current) drug therapy: Secondary | ICD-10-CM | POA: Diagnosis not present

## 2020-07-27 DIAGNOSIS — M26621 Arthralgia of right temporomandibular joint: Secondary | ICD-10-CM | POA: Diagnosis not present

## 2020-07-27 DIAGNOSIS — J849 Interstitial pulmonary disease, unspecified: Secondary | ICD-10-CM | POA: Diagnosis not present

## 2020-07-27 DIAGNOSIS — Z6821 Body mass index (BMI) 21.0-21.9, adult: Secondary | ICD-10-CM | POA: Diagnosis not present

## 2020-07-27 DIAGNOSIS — M2550 Pain in unspecified joint: Secondary | ICD-10-CM | POA: Diagnosis not present

## 2020-07-27 DIAGNOSIS — M0579 Rheumatoid arthritis with rheumatoid factor of multiple sites without organ or systems involvement: Secondary | ICD-10-CM | POA: Diagnosis not present

## 2020-07-31 ENCOUNTER — Other Ambulatory Visit: Payer: Self-pay | Admitting: Primary Care

## 2020-07-31 DIAGNOSIS — J849 Interstitial pulmonary disease, unspecified: Secondary | ICD-10-CM

## 2020-08-07 ENCOUNTER — Ambulatory Visit (INDEPENDENT_AMBULATORY_CARE_PROVIDER_SITE_OTHER): Payer: Medicare Other | Admitting: Internal Medicine

## 2020-08-07 ENCOUNTER — Encounter: Payer: Self-pay | Admitting: Internal Medicine

## 2020-08-07 ENCOUNTER — Other Ambulatory Visit: Payer: Self-pay

## 2020-08-07 VITALS — BP 96/60 | HR 90 | Temp 98.0°F | Ht 65.0 in | Wt 132.2 lb

## 2020-08-07 DIAGNOSIS — J310 Chronic rhinitis: Secondary | ICD-10-CM | POA: Diagnosis not present

## 2020-08-07 DIAGNOSIS — J849 Interstitial pulmonary disease, unspecified: Secondary | ICD-10-CM

## 2020-08-07 LAB — PULMONARY FUNCTION TEST
DL/VA % pred: 73 %
DL/VA: 3.07 ml/min/mmHg/L
DLCO cor % pred: 42 %
DLCO cor: 9.68 ml/min/mmHg
DLCO unc % pred: 42 %
DLCO unc: 9.68 ml/min/mmHg
FEF 25-75 Pre: 3.9 L/sec
FEF2575-%Pred-Pre: 180 %
FEV1-%Pred-Pre: 57 %
FEV1-Pre: 1.37 L
FEV1FVC-%Pred-Pre: 129 %
FEV6-%Pred-Pre: 45 %
FEV6-Pre: 1.37 L
FEV6FVC-%Pred-Pre: 105 %
FVC-%Pred-Pre: 43 %
FVC-Pre: 1.37 L
Pre FEV1/FVC ratio: 100 %
Pre FEV6/FVC Ratio: 100 %

## 2020-08-07 NOTE — Progress Notes (Addendum)
Earl Thomas    [id number]    1951/05/14  Primary Care Physician:Thomas, Earl Solomons, MD Date of Appointment: 08/07/2020 Established Patient Visit  Chief complaint:   Chief Complaint  Patient presents with  . Follow-up    Get breathing test results.    HPI: Earl Thomas is a 70 y.o. gentleman with history of RA-ILD.   Interval Updates: Here today for follow up for ILD. Has been on Ofev since March 2021. He is on 150 mg once a day.  Still having diarrhea but less so. He had PFTs today and his FVC has dropped from a year ago, has his DLCO.  Still on symbicort BID which is helping.   On methotrexate and simponi. Not requiring any prednisone for his RA joint symptoms.   Has chronic post nasal drainage and recently saw Earl Thomas in clinic. Was treated with a course of prednisone and doxycycline for this. He thinks it helped a little.   I have reviewed the patient's family social and past medical history and updated as appropriate.   Past Medical History:  Diagnosis Date  . Arthritis   . Glucose intolerance (impaired glucose tolerance)   . Hyperlipidemia   . 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    Past Surgical History:  Procedure Laterality Date  . ROTATOR CUFF REPAIR Right 01/02/16    Family History  Problem Relation Age of Onset  . Cancer Father 26       prostate cancer  . Diabetes Father   . Heart disease Father 33       AMI late 51s  . Cancer Mother 54       Breast cancer  . Heart disease Mother        CABG at age 97  . Diabetes Brother   . Heart disease Brother        AMI x 2; CABG  . Hyperlipidemia Brother     Social History   Occupational History  . Occupation: Professor  Tobacco Use  . Smoking status: Former Smoker    Years: 10.00    Types: Pipe    Quit date: 10/06/1988    Years since quitting: 31.8  . Smokeless tobacco: Never Used  . Tobacco comment: parent smoked in home as a child.   Smoked a pip in college, not daily.  Vaping Use  . Vaping Use: Never used  Substance and Sexual Activity  . Alcohol use: No  . Drug use: No  . Sexual activity: Yes    Physical Exam: Blood pressure 96/60, pulse 90, temperature 98 F (36.7 C), temperature source Temporal, height _0  (1.651 m), weight 132 lb 3.2 oz (60 kg), SpO2 95 %.  Gen:      No acute distress, thin, well-appearing. Lungs:    No increased respiratory effort, symmetric chest wall excursion, bibasilar crackles CV:         Regular rate and rhythm; no murmurs, rubs, or gallops.  No pedal edema MSK:      No acute synovitis  Data Reviewed: Imaging: I have personally reviewed the CT scan from Nov 2020 which demonstrates bibasilar honeycombing consistent with UIP pattern.  PFTs:  PFT Results Latest Ref Rng & Units 08/07/2020 07/26/2019  FVC-Pre L 1.37 1.52  FVC-Predicted Pre % 43 47  FVC-Post L - 1.82  FVC-Predicted Post % - 57  Pre FEV1/FVC % % 100 100  Post FEV1/FCV % % - 88  FEV1-Pre L 1.37 1.52  FEV1-Predicted Pre % 57 63  FEV1-Post L - 1.61  DLCO uncorrected ml/min/mmHg 9.68 11.79  DLCO UNC% % 42 52  DLCO corrected ml/min/mmHg 9.68 -  DLCO COR %Predicted % 42 -  DLVA Predicted % 73 82  TLC L - 3.42  TLC % Predicted % - 57  RV % Predicted % - 84   I have personally reviewed the patient's PFTs and which show moderate restriction with an FEV1 63% of predicted and FVC 47% of predicted.   Labs: I have reviewed his outside hospital labs noted on March 23 2019 progress note, his CCP level was greater than 255, Rheumatoid factor was 149 his, ESR was 109 his uric acid was normal. Reviewed outside records from Dr. Trudie Thomas' office in rheumatology  LFTs reviewed - no significant transaminitis on ofev.    Immunization status: Immunization History  Administered Date(s) Administered  . Fluad Quad(high Dose 65+) 04/13/2020  . Influenza, High Dose Seasonal PF 04/24/2019  . Influenza,inj,Quad PF,6+ Mos  05/30/2015, 04/23/2016, 05/14/2017, 03/25/2018  . Influenza-Unspecified 04/07/2014  . PFIZER(Purple Top)SARS-COV-2 Vaccination 08/09/2019, 08/30/2019, 03/07/2020  . Pneumococcal Conjugate-13 05/18/2018  . Pneumococcal Polysaccharide-23 06/09/2019  . Tdap 02/13/2009, 05/14/2017  . Zoster 04/12/2014  . 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   Plan/Recommendations: His FVC is dropping. Given his adverse effects to ofev and reduced dose, I think switching to another anti-fibrotic agent such as esbriet vs switching to a different IS agent such as imuran might be a good idea since it doesn't appear that the ofev is helping. Will discuss with Dr. Trudie Thomas as well as with ILD team and notify him.  He has chronic rhinitis which is not well controlled. Will refer to ENT to see if they have any other recommendations.  In the mean time would continue his singulair, antihistamine and fluticasone intranasal.  Discussed with ILD team: He has failed Ofev due to adverse effects and progression of disease. He has UIP with RA-ILD which has the potential for ongoing progression.  Will switch from Ofev to [place].  Would prefer keeping him on methotrexate for now since his joint symptoms are stable.   I spent 11mnutes in the care of this patient today including pre-charting, chart review, review of results, face-to-face care, coordination of care and communication with consultants etc.).  Return to Care: Return in about 3 months (around 11/04/2020).   [name] MD Pulmonary and [facility]

## 2020-08-07 NOTE — Patient Instructions (Signed)
The patient should have follow up scheduled with myself in 3 months.   Prior to next visit patient should have: Spirometry and DLCO  Your lung function has dropped a little over the past year. I think the ofev might not be helping and we should try another medication. I will contact you regarding changing your immune suppression medications after speaking with Dr. Trudie Reed.

## 2020-08-07 NOTE — Progress Notes (Signed)
Spirometry and Dlco done today.

## 2020-08-08 ENCOUNTER — Telehealth: Payer: Self-pay | Admitting: Pharmacist

## 2020-08-08 NOTE — Telephone Encounter (Signed)
Received notification from [facility] regarding a prior authorization for ESBRIET. Authorization has been APPROVED from 05/10/20 to [id number].   Member ID# [id number] Phone # [phone]

## 2020-08-08 NOTE — Telephone Encounter (Signed)
Please start Esbriet BIV per Dr. Shearon Stalls.  Patient has been on Ofev with recurrent diarrhea.

## 2020-08-08 NOTE — Telephone Encounter (Signed)
Ran test claim, patient's copay for 1st month supply is $30.00. No pharmacy restrictions

## 2020-08-09 ENCOUNTER — Telehealth: Payer: Self-pay | Admitting: Pharmacist

## 2020-08-09 ENCOUNTER — Other Ambulatory Visit: Payer: Self-pay | Admitting: Internal Medicine

## 2020-08-09 DIAGNOSIS — Z5181 Encounter for therapeutic drug level monitoring: Secondary | ICD-10-CM

## 2020-08-09 DIAGNOSIS — J849 Interstitial pulmonary disease, unspecified: Secondary | ICD-10-CM

## 2020-08-09 MED ORDER — ESBRIET 267 MG PO TABS: 801.0000 mg | ORAL_TABLET | Freq: Three times a day (TID) | ORAL | 0 refills | Status: DC

## 2020-08-09 MED ORDER — ESBRIET 267 MG PO TABS: ORAL_TABLET | ORAL | 0 refills | Status: DC

## 2020-08-09 NOTE — Telephone Encounter (Signed)
Mr. Kiper is an ILD patient previously taking Ofev. At his OV with Dr. Shearon Stalls on 08/07/20, patient reported recurrent diarrhea. He was agreeable to starting Esbriet.  Patient counseled on purpose, proper use, and potential adverse effects including nausea, vomiting, abdominal pain, GERD, weight loss, arthralgia, dizziness, and suns sensitivity/rash. Discussed importance of wearing sunscreen to prevent rash. Discussed taking Esbriet with food, incorporating protein into diet. Patient currently eats breakfast and dinner but will start eating lunch.  Stressed the importance of routine lab monitoring. Will monitor LFT's every month for the first 6 months of treatment then every 3 months. Standing lab orders for LFTs placed. Will monitor CBC every 3 months.  Starting dose will be Esbriet 267 mg 1 tablet three times daily for 7 days, then 2 tablets three times daily for 7 days, then 3 tablets three times daily.  Maintenance dose will be 801 mg three times daily if tolerated.  Stressed the importance of taking with meals (protein, yogurt) to minimize stomach upset.   Plan: - Patient will stop Ofev and start Esbriet. - Rx for Esbriet titration dose sent to [facility]. Copay is $30 per 30 days. - MyChart message sent with dosing information, counseling information, and walk-in lab information - Will call patient in 1 month to assess tolerance to Esbriet. Rx sent for maintenance with 3 tabs three times daily but will reorder as 1 tab (852m) three times daily if patient tolerating medicine.  Advised patient to call clinic if any issues with coordinating Esbriet shipment occur. All questions encouraged and answer.  DKnox Saliva PharmD, MPH Clinical Pharmacist (Rheumatology and Pulmonology)

## 2020-08-09 NOTE — Telephone Encounter (Signed)
Patient advised about Esbriet copay - reports it's the same copay as his Ofev. Has Ofev remaining at home but plans to start Esbriet once he receives from [facility]. Counseling documented in separate encounter.

## 2020-08-18 DIAGNOSIS — M0579 Rheumatoid arthritis with rheumatoid factor of multiple sites without organ or systems involvement: Secondary | ICD-10-CM | POA: Diagnosis not present

## 2020-08-18 DIAGNOSIS — Z79899 Other long term (current) drug therapy: Secondary | ICD-10-CM | POA: Diagnosis not present

## 2020-08-22 ENCOUNTER — Other Ambulatory Visit: Payer: Self-pay | Admitting: Pharmacist

## 2020-08-22 DIAGNOSIS — J3089 Other allergic rhinitis: Secondary | ICD-10-CM

## 2020-08-22 MED ORDER — FLUTICASONE PROPIONATE 50 MCG/ACT NA SUSP: 1.0000 | Freq: Every day | NASAL | 2 refills | Status: DC

## 2020-08-22 NOTE — Telephone Encounter (Signed)
Refill for fluticasone nasal spray re-sent to [facility] on [address] in [place].  Knox Saliva, PharmD, MPH Clinical Pharmacist (Rheumatology and Pulmonology)

## 2020-08-23 ENCOUNTER — Ambulatory Visit (INDEPENDENT_AMBULATORY_CARE_PROVIDER_SITE_OTHER): Payer: Medicare Other | Admitting: Otolaryngology

## 2020-08-23 ENCOUNTER — Other Ambulatory Visit: Payer: Self-pay | Admitting: Internal Medicine

## 2020-08-23 DIAGNOSIS — J849 Interstitial pulmonary disease, unspecified: Secondary | ICD-10-CM

## 2020-08-25 ENCOUNTER — Ambulatory Visit (INDEPENDENT_AMBULATORY_CARE_PROVIDER_SITE_OTHER): Payer: Medicare Other | Admitting: Otolaryngology

## 2020-08-25 ENCOUNTER — Encounter (INDEPENDENT_AMBULATORY_CARE_PROVIDER_SITE_OTHER): Payer: Self-pay | Admitting: Otolaryngology

## 2020-08-25 ENCOUNTER — Other Ambulatory Visit: Payer: Self-pay

## 2020-08-25 VITALS — Temp 97.5°F

## 2020-08-25 DIAGNOSIS — J310 Chronic rhinitis: Secondary | ICD-10-CM | POA: Diagnosis not present

## 2020-08-25 NOTE — Progress Notes (Signed)
HPI: Earl Thomas is a 70 y.o. male who presents is referred by Lenice Llamas MD for evaluation of chronic rhinitis.  Patient complains of drainage from his nose as well as occasional pain and burning in the nose with sometimes bloody mucus.  He has also had a dry cough with thick phlegm.  He has been using Flonase.Marland Kitchen  Past Medical History:  Diagnosis Date  . Arthritis   . Glucose intolerance (impaired glucose tolerance)   . Hyperlipidemia   . 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   Past Surgical History:  Procedure Laterality Date  . ROTATOR CUFF REPAIR Right 01/02/16   Social History   Socioeconomic History  . Marital status: Married    Spouse name: Not on file  . Number of children: 2  . Years of education: Not on file  . Highest education level: Not on file  Occupational History  . Occupation: Professor  Tobacco Use  . Smoking status: Former Smoker    Years: 10.00    Types: Pipe    Quit date: 10/06/1988    Years since quitting: 31.9  . Smokeless tobacco: Never Used  . Tobacco comment: parent smoked in home as a child.  Smoked a pip in college, not daily.  Vaping Use  . Vaping Use: Never used  Substance and Sexual Activity  . Alcohol use: No  . Drug use: No  . Sexual activity: Yes  Other Topics Concern  . 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      Tobacco: pipe in 1980s      Alcohol: none      Exercise: joined [employer]; going 2-3 times per week.        Seatbelt:  100%   Social Determinants of Radio broadcast assistant Strain: Not on file  Food Insecurity: Not on file  Transportation Needs: Not on file  Physical Activity: Not on file   Stress: Not on file  Social Connections: Not on file   Family History  Problem Relation Age of Onset  . Cancer Father 66       prostate cancer  . Diabetes Father   . Heart disease Father 15       AMI late 73s  . Cancer Mother 12       Breast cancer  . Heart disease Mother        CABG at age 47  . Diabetes Brother   . Heart disease Brother        AMI x 2; CABG  . Hyperlipidemia Brother    No Known Allergies Prior to Admission medications   Medication Sig Start Date End Date Taking? Authorizing Provider  albuterol (VENTOLIN HFA) 108 (90 Base) MCG/ACT inhaler Inhale 2 puffs into the lungs every 6 (six) hours as needed for wheezing or shortness of breath. 06/09/19   Forrest Moron, MD  aspirin EC 81 MG tablet Take 81 mg by mouth daily.    [provider]  atorvastatin (LIPITOR) 20 MG tablet Take 1 tablet (20 mg total) by mouth daily at 6 PM. 05/14/19   Forrest Moron, MD  benzonatate (TESSALON) 200 MG capsule Take 1  capsule (200 mg total) by mouth 3 (three) times daily as needed for cough. Patient not taking: Reported on 08/07/2020 01/28/20   Martyn Ehrich, NP  budesonide-formoterol [facility]) 80-4.5 MCG/ACT inhaler Inhale 2 puffs into the lungs 2 (two) times daily. 07/16/19   Wendall Mola, NP  diclofenac sodium (VOLTAREN) 1 % GEL Apply 2 g topically 4 (four) times daily. 09/18/18   Stallings, Gwendolyn Fill A, MD  ESBRIET 267 MG TABS TAKE 3 TABLETS (801 MG TOTAL) BY MOUTH WITH BREAKFAST, WITH LUNCH, AND with evening meal. 08/09/20   Spero Geralds, MD  fluticasone [facility]) 50 MCG/ACT nasal spray Place 1 spray into both nostrils daily. 08/22/20   Spero Geralds, MD  folic acid ([name]) 1 MG tablet Take 1 mg by mouth daily. 08/27/19   [provider]  Golimumab ([place] ARIA IV) Inject into the vein. Every 8 weeks infusion    [provider]  hydrocortisone (PROCTOSOL HC) 2.5 % rectal cream APPLY ONE APPLICATORFUL RECTALLY TWICE DAILY as needed for hemorrhoid  pain/itching/bleeding 11/12/17   Wardell Honour, MD  loperamide (IMODIUM) 2 MG capsule Take 2 mg by mouth as needed for diarrhea or loose stools.    [provider]  meloxicam (MOBIC) 7.5 MG tablet Take 1 tablet (7.5 mg total) by mouth daily as needed for pain. 05/14/19   Forrest Moron, MD  methotrexate (RHEUMATREX) 10 MG tablet Take 10 mg by mouth once a week. Caution: Chemotherapy. Protect from light.    [provider]  METHOTREXATE SODIUM PO Take 60 mg by mouth once a week. 6 tabs weekly    [provider]  montelukast (SINGULAIR) 10 MG tablet Take 1 tablet (10 mg total) by mouth daily. 04/27/20   Spero Geralds, MD  [name] (ESBRIET) 267 MG TABS Take 1 tablet by mouth three times daily for 7 days, then increase to 2 tabs by mouth three times daily for 7 days, then increase to 3 tabs three times daily thereafter. 08/09/20   Spero Geralds, MD  tadalafil (ADCIRCA/CIALIS) 20 MG tablet Take 0.5 tablets (10 mg total) by mouth every other day as needed for erectile dysfunction. 07/16/18   Forrest Moron, MD     Positive ROS: Otherwise negative  All other systems have been reviewed and were otherwise negative with the exception of those mentioned in the HPI and as above.  Physical Exam: Constitutional: Alert, well-appearing, no acute distress Ears: External ears without lesions or tenderness. Ear canals are clear bilaterally with intact, clear TMs bilaterally. Nasal: External nose without lesions. Septum is relatively midline with moderate rhinitis and clear mucus discharge.  Of note after decongesting the nose with Afrin he had some scabbing and crusting along the left inferior turbinate.  On nasal endoscopy the middle meatus regions were clear bilaterally with minimal mucosal edema and no mucopurulent discharge noted.  The posterior ethmoid region and sphenoid region were clear with no active mucopurulent discharge noted.  The nasopharynx was clear although he had  prominent adenoid tissue. Oral: Lips and gums without lesions. Tongue and palate mucosa without lesions. Posterior oropharynx clear. Neck: No palpable adenopathy or masses Respiratory: Breathing comfortably  Skin: No facial/neck lesions or rash noted.  Procedures  Assessment: Chronic rhinitis with some crusting along the left inferior turbinate.  Plan: Recommended stopping the Flonase and use of just saline rinses and reviewed this with him.  He has used saline rinses in the past and suggested trying the [place] brand.  If  he has any drying or crusting within the nasal cavity recommended use of nasal gel spray to moisturize the mucous membranes.  As he does have some mild mucosal inflammation within the nasal cavity but no real evidence of active infection or mucopurulent discharge requiring antibiotic therapy. He will follow-up in 4 weeks for recheck.   Radene Journey, MD   CC:

## 2020-09-19 ENCOUNTER — Other Ambulatory Visit: Payer: Self-pay | Admitting: Internal Medicine

## 2020-09-19 ENCOUNTER — Other Ambulatory Visit (INDEPENDENT_AMBULATORY_CARE_PROVIDER_SITE_OTHER): Payer: Medicare Other

## 2020-09-19 DIAGNOSIS — Z5181 Encounter for therapeutic drug level monitoring: Secondary | ICD-10-CM

## 2020-09-19 DIAGNOSIS — J849 Interstitial pulmonary disease, unspecified: Secondary | ICD-10-CM

## 2020-09-19 LAB — HEPATIC FUNCTION PANEL
ALT: 51 U/L (ref 0–53)
AST: 28 U/L (ref 0–37)
Albumin: 3.9 g/dL (ref 3.5–5.2)
Alkaline Phosphatase: 44 U/L (ref 39–117)
Bilirubin, Direct: 0.1 mg/dL (ref 0.0–0.3)
Total Bilirubin: 0.5 mg/dL (ref 0.2–1.2)
Total Protein: 7.6 g/dL (ref 6.0–8.3)

## 2020-09-22 ENCOUNTER — Ambulatory Visit (INDEPENDENT_AMBULATORY_CARE_PROVIDER_SITE_OTHER): Payer: Medicare Other | Admitting: Otolaryngology

## 2020-09-26 ENCOUNTER — Ambulatory Visit (INDEPENDENT_AMBULATORY_CARE_PROVIDER_SITE_OTHER): Payer: Medicare Other | Admitting: Otolaryngology

## 2020-09-26 ENCOUNTER — Other Ambulatory Visit: Payer: Self-pay

## 2020-09-26 VITALS — Temp 97.9°F

## 2020-09-26 DIAGNOSIS — R040 Epistaxis: Secondary | ICD-10-CM | POA: Diagnosis not present

## 2020-09-26 DIAGNOSIS — J310 Chronic rhinitis: Secondary | ICD-10-CM | POA: Diagnosis not present

## 2020-09-26 NOTE — Progress Notes (Signed)
HPI: Earl Thomas is a 70 y.o. male who returns today for evaluation of chronic rhinitis and nasal drip.  He has been using nasal steroid spray Flonase as well as saline rinse.  The nasal drip is generally clear but sometimes he gets a bloody mucus discharge from his nose when he blows his nose.  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   Past Surgical History:  Procedure Laterality Date   ROTATOR CUFF REPAIR Right 01/02/16   Social History   Socioeconomic History   Marital status: Married    Spouse name: Not on file   Number of children: 2   Years of education: Not on file   Highest education level: Not on file  Occupational History   Occupation: Professor  Tobacco Use   Smoking status: Former Smoker    Years: 10.00    Types: Pipe    Quit date: 10/06/1988    Years since quitting: 31.9   Smokeless tobacco: Never Used   Tobacco comment: parent smoked in home as a child.  Smoked a pip in college, not daily.  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      Tobacco: pipe in 1980s      Alcohol: none      Exercise: joined [employer]; going 2-3 times per week.        Seatbelt:  100%   Social Determinants of Radio broadcast assistant Strain: Not on file  Food Insecurity: Not on file  Transportation Needs: Not on file   Physical Activity: Not on file  Stress: Not on file  Social Connections: Not on file   Family History  Problem Relation Age of Onset   Cancer Father 27       prostate cancer   Diabetes Father    Heart disease Father 54       AMI late 47s   Cancer Mother 63       Breast cancer   Heart disease Mother        CABG at age 59   Diabetes Brother    Heart disease Brother        AMI x 2; CABG   Hyperlipidemia Brother    No Known Allergies Prior to Admission medications   Medication Sig Start Date End Date Taking? Authorizing Provider  albuterol (VENTOLIN HFA) 108 (90 Base) MCG/ACT inhaler Inhale 2 puffs into the lungs every 6 (six) hours as needed for wheezing or shortness of breath. 06/09/19   Forrest Moron, MD  aspirin EC 81 MG tablet Take 81 mg by mouth daily.    [provider]  atorvastatin (LIPITOR) 20 MG tablet Take 1 tablet (20 mg total) by mouth daily at 6 PM. 05/14/19   Forrest Moron, MD  benzonatate (TESSALON) 200  MG capsule Take 1 capsule (200 mg total) by mouth 3 (three) times daily as needed for cough. Patient not taking: Reported on 08/07/2020 01/28/20   Martyn Ehrich, NP  budesonide-formoterol [facility]) 80-4.5 MCG/ACT inhaler Inhale 2 puffs into the lungs 2 (two) times daily. 07/16/19   Wendall Mola, NP  diclofenac sodium (VOLTAREN) 1 % GEL Apply 2 g topically 4 (four) times daily. 09/18/18   Stallings, Gwendolyn Fill A, MD  ESBRIET 267 MG TABS TAKE 3 TABLETS (801 MG TOTAL) BY MOUTH WITH BREAKFAST, WITH LUNCH, AND with evening meal. 08/09/20   Spero Geralds, MD  fluticasone [facility]) 50 MCG/ACT nasal spray Place 1 spray into both nostrils daily. 08/22/20   Spero Geralds, MD  folic acid ([name]) 1 MG tablet Take 1 mg by mouth daily. 08/27/19   [provider]  Golimumab ([place] ARIA IV) Inject into the vein. Every 8 weeks infusion    [provider]  hydrocortisone (PROCTOSOL HC) 2.5 % rectal cream APPLY ONE APPLICATORFUL RECTALLY TWICE  DAILY as needed for hemorrhoid pain/itching/bleeding 11/12/17   Wardell Honour, MD  loperamide (IMODIUM) 2 MG capsule Take 2 mg by mouth as needed for diarrhea or loose stools.    [provider]  meloxicam (MOBIC) 7.5 MG tablet Take 1 tablet (7.5 mg total) by mouth daily as needed for pain. 05/14/19   Forrest Moron, MD  methotrexate (RHEUMATREX) 10 MG tablet Take 10 mg by mouth once a week. Caution: Chemotherapy. Protect from light.    [provider]  METHOTREXATE SODIUM PO Take 60 mg by mouth once a week. 6 tabs weekly    [provider]  montelukast (SINGULAIR) 10 MG tablet Take 1 tablet (10 mg total) by mouth daily. 04/27/20   Spero Geralds, MD  [name] (ESBRIET) 267 MG TABS Take 3 tablets by mouth 3 times daily 09/20/20   Spero Geralds, MD  tadalafil (ADCIRCA/CIALIS) 20 MG tablet Take 0.5 tablets (10 mg total) by mouth every other day as needed for erectile dysfunction. 07/16/18   Forrest Moron, MD     Positive ROS: Otherwise negative  All other systems have been reviewed and were otherwise negative with the exception of those mentioned in the HPI and as above.  Physical Exam: Constitutional: Alert, well-appearing, no acute distress Ears: External ears without lesions or tenderness. Ear canals are clear bilaterally with intact, clear TMs.  Nasal: External nose without lesions. Septum is relatively midline.  Of note he has scabbing along the right lateral nasal cavity just anterior to the inferior turbinate that bled when the scab was removed and this was cauterized using silver nitrate.  He also had some bleeding along the left anterior inferior septum with a little bit of scabbing and crusting.  When the scab and crust was removed he had slight bleeding here also and this was cauterized with silver nitrate.  The remaining nasal cavity is clear.  No polyps noted.  Both millimeters regions are clear.Marland Kitchen  He is not having any trouble breathing through his  nose. Oral: Lips and gums without lesions. Tongue and palate mucosa without lesions. Posterior oropharynx clear. Neck: No palpable adenopathy or masses Respiratory: Breathing comfortably  Skin: No facial/neck lesions or rash noted.  Procedures  Assessment: Chronic rhinitis with intermittent nasal bleeding.  No signs of infection.  Plan: Suggested stopping the nasal steroid spray for a while and just use saline rinse.  If his nose is running suggested use of antihistamines as  needed. He will follow-up in 5 to 6 weeks for recheck.   Radene Journey, MD

## 2020-10-19 DIAGNOSIS — M0579 Rheumatoid arthritis with rheumatoid factor of multiple sites without organ or systems involvement: Secondary | ICD-10-CM | POA: Diagnosis not present

## 2020-10-19 DIAGNOSIS — Z79899 Other long term (current) drug therapy: Secondary | ICD-10-CM | POA: Diagnosis not present

## 2020-10-23 ENCOUNTER — Other Ambulatory Visit (INDEPENDENT_AMBULATORY_CARE_PROVIDER_SITE_OTHER): Payer: Medicare Other

## 2020-10-23 ENCOUNTER — Other Ambulatory Visit: Payer: Medicare Other

## 2020-10-23 DIAGNOSIS — Z5181 Encounter for therapeutic drug level monitoring: Secondary | ICD-10-CM

## 2020-10-23 LAB — HEPATIC FUNCTION PANEL
ALT: 49 U/L (ref 0–53)
AST: 29 U/L (ref 0–37)
Albumin: 4 g/dL (ref 3.5–5.2)
Alkaline Phosphatase: 45 U/L (ref 39–117)
Bilirubin, Direct: 0.1 mg/dL (ref 0.0–0.3)
Total Bilirubin: 0.4 mg/dL (ref 0.2–1.2)
Total Protein: 7.9 g/dL (ref 6.0–8.3)

## 2020-10-25 DIAGNOSIS — J849 Interstitial pulmonary disease, unspecified: Secondary | ICD-10-CM | POA: Diagnosis not present

## 2020-10-25 DIAGNOSIS — M2550 Pain in unspecified joint: Secondary | ICD-10-CM | POA: Diagnosis not present

## 2020-10-25 DIAGNOSIS — M0579 Rheumatoid arthritis with rheumatoid factor of multiple sites without organ or systems involvement: Secondary | ICD-10-CM | POA: Diagnosis not present

## 2020-10-25 DIAGNOSIS — Z6821 Body mass index (BMI) 21.0-21.9, adult: Secondary | ICD-10-CM | POA: Diagnosis not present

## 2020-10-25 DIAGNOSIS — Z79899 Other long term (current) drug therapy: Secondary | ICD-10-CM | POA: Diagnosis not present

## 2020-10-31 ENCOUNTER — Ambulatory Visit (INDEPENDENT_AMBULATORY_CARE_PROVIDER_SITE_OTHER): Payer: Medicare Other | Admitting: Otolaryngology

## 2020-11-08 ENCOUNTER — Other Ambulatory Visit: Payer: Self-pay | Admitting: *Deleted

## 2020-11-08 DIAGNOSIS — J849 Interstitial pulmonary disease, unspecified: Secondary | ICD-10-CM

## 2020-11-10 ENCOUNTER — Other Ambulatory Visit (HOSPITAL_COMMUNITY)
Admission: RE | Admit: 2020-11-10 | Discharge: 2020-11-10 | Disposition: A | Discharge status: Home / Self Care | Payer: Medicare Other | Source: Ambulatory Visit | Attending: Internal Medicine | Admitting: Internal Medicine

## 2020-11-10 DIAGNOSIS — Z20822 Contact with and (suspected) exposure to covid-19: Secondary | ICD-10-CM | POA: Diagnosis not present

## 2020-11-10 DIAGNOSIS — Z01812 Encounter for preprocedural laboratory examination: Secondary | ICD-10-CM | POA: Diagnosis not present

## 2020-11-10 LAB — SARS CORONAVIRUS 2 (TAT 6-24 HRS): SARS Coronavirus 2: NEGATIVE

## 2020-11-13 ENCOUNTER — Other Ambulatory Visit: Payer: Self-pay

## 2020-11-13 ENCOUNTER — Encounter: Payer: Self-pay | Admitting: Internal Medicine

## 2020-11-13 ENCOUNTER — Ambulatory Visit (INDEPENDENT_AMBULATORY_CARE_PROVIDER_SITE_OTHER): Payer: Medicare Other | Admitting: Internal Medicine

## 2020-11-13 VITALS — BP 104/60 | HR 81 | Ht 65.0 in | Wt 133.0 lb

## 2020-11-13 DIAGNOSIS — J849 Interstitial pulmonary disease, unspecified: Secondary | ICD-10-CM | POA: Diagnosis not present

## 2020-11-13 DIAGNOSIS — M0579 Rheumatoid arthritis with rheumatoid factor of multiple sites without organ or systems involvement: Secondary | ICD-10-CM

## 2020-11-13 DIAGNOSIS — J310 Chronic rhinitis: Secondary | ICD-10-CM | POA: Diagnosis not present

## 2020-11-13 LAB — PULMONARY FUNCTION TEST
DL/VA % pred: 104 %
DL/VA: 4.39 ml/min/mmHg/L
DLCO cor % pred: 49 %
DLCO cor: 11.16 ml/min/mmHg
DLCO unc % pred: 49 %
DLCO unc: 11.16 ml/min/mmHg
FEF 25-75 Pre: 4.34 L/sec
FEF2575-%Pred-Pre: 200 %
FEV1-%Pred-Pre: 65 %
FEV1-Pre: 1.57 L
FEV1FVC-%Pred-Pre: 119 %
FEV6-%Pred-Pre: 55 %
FEV6-Pre: 1.67 L
FEV6FVC-%Pred-Pre: 104 %
FVC-%Pred-Pre: 54 %
FVC-Pre: 1.71 L
Pre FEV1/FVC ratio: 92 %
Pre FEV6/FVC Ratio: 99 %

## 2020-11-13 MED ORDER — IPRATROPIUM BROMIDE 0.06 % NA SOLN: 1.0000 | Freq: Three times a day (TID) | NASAL | 12 refills | Status: DC

## 2020-11-13 NOTE — Patient Instructions (Addendum)
The patient should have follow up scheduled with myself in 4 months.   Prior to next visit patient should have:  Spirometry/DLCO - 30 minutes, prior to appointment  Cetirizine - store brand, take at night could make you sleep, will   Start atrovent nasal spray 1 spray each side of the nose three times daily.   Continue esbriet.

## 2020-11-13 NOTE — Progress Notes (Signed)
Spirometry and DLCO completed today

## 2020-11-13 NOTE — Progress Notes (Signed)
Earl Thomas    [id number]    1951-04-10  Primary Care Physician:Stallings, Arlie Solomons, MD Date of Appointment: 11/13/2020 Established Patient Visit  Chief complaint:   Chief Complaint  Patient presents with  . Interstitial Lung Disease    With PFT    HPI: Earl Thomas is a 70 y.o. gentleman with history of RA-ILD. Intolerant of Ofev due to diarrhea.   Interval Updates: Here today for follow up for IL, started on esbriet. Having lose stools - bowel movements 2-3 times/day. He is having decreased appetite and he has to push himself to eat. He has early satiety. Denies reflux/heart burn.   On symbicort 2 puffs BID with improvement in his smptoms.   On methotrexate and last simponi infusion was 2 weeks ago. Joint symptoms are mostly in his hands - index and middle finger. Taking meloxicam which helps his joints  Still having post nasal drainage, about the same. Improved with allegra which he recently started, but only lasting   I have reviewed the patient's family social and past medical history and updated as appropriate.   Past Medical History:  Diagnosis Date  . Arthritis   . Glucose intolerance (impaired glucose tolerance)   . Hyperlipidemia   . 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    Past Surgical History:  Procedure Laterality Date  . ROTATOR CUFF REPAIR Right 01/02/16    Family History  Problem Relation Age of Onset  . Cancer Father 61       prostate cancer  . Diabetes Father   . Heart disease Father 28       AMI late 56s  . Cancer Mother 62       Breast cancer  . Heart disease Mother        CABG at age 63  . Diabetes Brother   . Heart disease Brother        AMI x 2; CABG  . Hyperlipidemia Brother     Social History   Occupational History  . Occupation: Professor  Tobacco Use  . Smoking status: Former Smoker    Years: 10.00    Types: Pipe    Quit date: 10/06/1988    Years since quitting: 32.1   . Smokeless tobacco: Never Used  . Tobacco comment: parent smoked in home as a child.  Smoked a pip in college, not daily.  Vaping Use  . Vaping Use: Never used  Substance and Sexual Activity  . Alcohol use: No  . Drug use: No  . Sexual activity: Yes    Physical Exam: Blood pressure 104/60, pulse 81, height _0  (1.651 m), weight 133 lb (60.3 kg), SpO2 94 %.  Gen:     No distress, well appearing, stable on room air ENT:      Mild nasal debris, no polyps Lungs:    Symmetric chest wall expansion, bibasilar crackles, no wheezes CV:         RRR no mrg MSK:     No acute synovitis, no erythematous or swollen joints,   Data Reviewed: Imaging: I have personally reviewed the CT scan from Nov 2020 which demonstrates bibasilar honeycombing consistent with UIP pattern.  PFTs:  PFT Results Latest Ref Rng & Units 11/13/2020 08/07/2020 07/26/2019  FVC-Pre L 1.71 1.37 1.52  FVC-Predicted Pre % 54 43 47  FVC-Post L - - 1.82  FVC-Predicted Post % - - 57  Pre FEV1/FVC % % 92 100  100  Post FEV1/FCV % % - - 88  FEV1-Pre L 1.57 1.37 1.52  FEV1-Predicted Pre % 65 57 63  FEV1-Post L - - 1.61  DLCO uncorrected ml/min/mmHg 11.16 9.68 11.79  DLCO UNC% % 49 42 52  DLCO corrected ml/min/mmHg 11.16 9.68 -  DLCO COR %Predicted % 49 42 -  DLVA Predicted % 104 73 82  TLC L - - 3.42  TLC % Predicted % - - 57  RV % Predicted % - - 84   I have personally reviewed the patient's PFTs and which show moderate restriction with an FVC of 54% which is stable to improved from most recent PFTs.   Labs: I have reviewed his outside hospital labs noted on March 23 2019 progress note, his CCP level was greater than 255, Rheumatoid factor was 149 his, ESR was 109 his uric acid was normal. Reviewed outside records from Dr. Trudie Reed' office in rheumatology  LFTs April 2022 wnl   Immunization status: Immunization History  Administered Date(s) Administered  . Fluad Quad(high Dose 65+) 04/13/2020  . Influenza,  High Dose Seasonal PF 04/24/2019  . Influenza,inj,Quad PF,6+ Mos 05/30/2015, 04/23/2016, 05/14/2017, 03/25/2018  . Influenza-Unspecified 04/07/2014  . PFIZER(Purple Top)SARS-COV-2 Vaccination 08/09/2019, 08/30/2019, 03/07/2020  . Pneumococcal Conjugate-13 05/18/2018  . Pneumococcal Polysaccharide-23 06/09/2019  . Tdap 02/13/2009, 05/14/2017  . Zoster 04/12/2014  . 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   Plan/Recommendations: Stable FVC. Having some side effects from Esbriet but they are tolerable- mostly diarrhea.  His bigger issue which his wife mentions is rhinitis. I sent him to ENT who said his nasal passages were dry and recommended stopping flonase. Allegra is helping some. Could be related to vasomotor rhinitis. Would stop allegra and switch to cetirizine for stronger anti-histamine related drying. Will try ipratropium nasal spray for possible vasomotor rhinitis.  Continue singulair. Continue to abstain from flonase for now.    Continue Esbriet for now. Would continue Simponi/Methotrexate for extra-pulmonary symptoms.   If not tolerant of esbriet would try switching up his RA meds and stop methotrexate and start imuran.   Early satiety could be related to ILD or to the Esbriet. Recommend smaller more frequent meals. Denies reflux symptoms.   Return to Care: Return in about 4 months (around 03/16/2021).   Lenice Llamas, MD [facility] [place]

## 2020-11-21 DIAGNOSIS — R7989 Other specified abnormal findings of blood chemistry: Secondary | ICD-10-CM | POA: Diagnosis not present

## 2020-11-28 ENCOUNTER — Other Ambulatory Visit (INDEPENDENT_AMBULATORY_CARE_PROVIDER_SITE_OTHER): Payer: Medicare Other

## 2020-11-28 DIAGNOSIS — Z5181 Encounter for therapeutic drug level monitoring: Secondary | ICD-10-CM | POA: Diagnosis not present

## 2020-11-28 LAB — HEPATIC FUNCTION PANEL
ALT: 42 U/L (ref 0–53)
AST: 32 U/L (ref 0–37)
Albumin: 4 g/dL (ref 3.5–5.2)
Alkaline Phosphatase: 48 U/L (ref 39–117)
Bilirubin, Direct: 0.1 mg/dL (ref 0.0–0.3)
Total Bilirubin: 0.4 mg/dL (ref 0.2–1.2)
Total Protein: 7.6 g/dL (ref 6.0–8.3)

## 2020-12-12 ENCOUNTER — Ambulatory Visit: Payer: Medicare Other | Attending: Internal Medicine

## 2020-12-12 DIAGNOSIS — Z20822 Contact with and (suspected) exposure to covid-19: Secondary | ICD-10-CM | POA: Diagnosis not present

## 2020-12-13 LAB — SARS-COV-2, NAA 2 DAY TAT

## 2020-12-13 LAB — NOVEL CORONAVIRUS, NAA: SARS-CoV-2, NAA: NOT DETECTED

## 2020-12-14 DIAGNOSIS — M0579 Rheumatoid arthritis with rheumatoid factor of multiple sites without organ or systems involvement: Secondary | ICD-10-CM | POA: Diagnosis not present

## 2020-12-25 DIAGNOSIS — Z23 Encounter for immunization: Secondary | ICD-10-CM | POA: Diagnosis not present

## 2020-12-27 ENCOUNTER — Other Ambulatory Visit (INDEPENDENT_AMBULATORY_CARE_PROVIDER_SITE_OTHER): Payer: Medicare Other

## 2020-12-27 DIAGNOSIS — Z5181 Encounter for therapeutic drug level monitoring: Secondary | ICD-10-CM | POA: Diagnosis not present

## 2020-12-27 LAB — HEPATIC FUNCTION PANEL
ALT: 49 U/L (ref 0–53)
AST: 32 U/L (ref 0–37)
Albumin: 4.1 g/dL (ref 3.5–5.2)
Alkaline Phosphatase: 54 U/L (ref 39–117)
Bilirubin, Direct: 0.1 mg/dL (ref 0.0–0.3)
Total Bilirubin: 0.4 mg/dL (ref 0.2–1.2)
Total Protein: 8 g/dL (ref 6.0–8.3)

## 2021-01-17 ENCOUNTER — Other Ambulatory Visit (INDEPENDENT_AMBULATORY_CARE_PROVIDER_SITE_OTHER): Payer: Medicare Other

## 2021-01-17 DIAGNOSIS — Z5181 Encounter for therapeutic drug level monitoring: Secondary | ICD-10-CM | POA: Diagnosis not present

## 2021-01-17 LAB — HEPATIC FUNCTION PANEL
ALT: 33 U/L (ref 0–53)
AST: 23 U/L (ref 0–37)
Albumin: 4 g/dL (ref 3.5–5.2)
Alkaline Phosphatase: 45 U/L (ref 39–117)
Bilirubin, Direct: 0.1 mg/dL (ref 0.0–0.3)
Total Bilirubin: 0.6 mg/dL (ref 0.2–1.2)
Total Protein: 7.2 g/dL (ref 6.0–8.3)

## 2021-01-22 DIAGNOSIS — R7989 Other specified abnormal findings of blood chemistry: Secondary | ICD-10-CM | POA: Diagnosis not present

## 2021-02-09 DIAGNOSIS — M0579 Rheumatoid arthritis with rheumatoid factor of multiple sites without organ or systems involvement: Secondary | ICD-10-CM | POA: Diagnosis not present

## 2021-02-09 DIAGNOSIS — R5383 Other fatigue: Secondary | ICD-10-CM | POA: Diagnosis not present

## 2021-02-09 DIAGNOSIS — Z111 Encounter for screening for respiratory tuberculosis: Secondary | ICD-10-CM | POA: Diagnosis not present

## 2021-02-09 DIAGNOSIS — Z79899 Other long term (current) drug therapy: Secondary | ICD-10-CM | POA: Diagnosis not present

## 2021-02-13 DIAGNOSIS — M0579 Rheumatoid arthritis with rheumatoid factor of multiple sites without organ or systems involvement: Secondary | ICD-10-CM | POA: Diagnosis not present

## 2021-02-13 DIAGNOSIS — Z79899 Other long term (current) drug therapy: Secondary | ICD-10-CM | POA: Diagnosis not present

## 2021-02-13 DIAGNOSIS — M2550 Pain in unspecified joint: Secondary | ICD-10-CM | POA: Diagnosis not present

## 2021-02-13 DIAGNOSIS — Z6821 Body mass index (BMI) 21.0-21.9, adult: Secondary | ICD-10-CM | POA: Diagnosis not present

## 2021-02-13 DIAGNOSIS — J849 Interstitial pulmonary disease, unspecified: Secondary | ICD-10-CM | POA: Diagnosis not present

## 2021-02-23 ENCOUNTER — Other Ambulatory Visit (INDEPENDENT_AMBULATORY_CARE_PROVIDER_SITE_OTHER): Payer: Medicare Other

## 2021-02-23 DIAGNOSIS — Z5181 Encounter for therapeutic drug level monitoring: Secondary | ICD-10-CM | POA: Diagnosis not present

## 2021-02-23 LAB — HEPATIC FUNCTION PANEL
ALT: 39 U/L (ref 0–53)
AST: 26 U/L (ref 0–37)
Albumin: 4.1 g/dL (ref 3.5–5.2)
Alkaline Phosphatase: 50 U/L (ref 39–117)
Bilirubin, Direct: 0.1 mg/dL (ref 0.0–0.3)
Total Bilirubin: 0.5 mg/dL (ref 0.2–1.2)
Total Protein: 7.5 g/dL (ref 6.0–8.3)

## 2021-02-27 ENCOUNTER — Other Ambulatory Visit: Payer: Self-pay

## 2021-02-27 ENCOUNTER — Telehealth: Payer: Self-pay

## 2021-02-27 DIAGNOSIS — E7800 Pure hypercholesterolemia, unspecified: Secondary | ICD-10-CM

## 2021-02-27 DIAGNOSIS — E782 Mixed hyperlipidemia: Secondary | ICD-10-CM

## 2021-02-27 MED ORDER — ATORVASTATIN CALCIUM 20 MG PO TABS: 20.0000 mg | ORAL_TABLET | Freq: Every day | ORAL | 1 refills | Status: DC

## 2021-02-27 NOTE — Telephone Encounter (Signed)
I have not seen him recently, and no recent lipid panel but noted recent lft's. I can provide a temporary refill but please schedule lab visit as well.

## 2021-02-27 NOTE — Telephone Encounter (Signed)
Patient aware that refill has been sent. Lab appt scheduled

## 2021-02-27 NOTE — Telephone Encounter (Signed)
Pt does not see Dr Carlota Raspberry until December the provider that is out on leave that provides his medication and he is wanting to know if Dr Carlota Raspberry with refill the medication?   atorvastatin (LIPITOR) 20 MG tablet  [facility] in [place] is where the RX needs to be sent to.   Pt call back [phone]  NEW 12/22

## 2021-03-05 ENCOUNTER — Other Ambulatory Visit: Payer: Self-pay

## 2021-03-13 ENCOUNTER — Other Ambulatory Visit: Payer: Self-pay

## 2021-03-13 ENCOUNTER — Other Ambulatory Visit (INDEPENDENT_AMBULATORY_CARE_PROVIDER_SITE_OTHER): Payer: Medicare Other

## 2021-03-13 DIAGNOSIS — E782 Mixed hyperlipidemia: Secondary | ICD-10-CM | POA: Diagnosis not present

## 2021-03-13 LAB — COMPREHENSIVE METABOLIC PANEL
ALT: 31 U/L (ref 0–53)
AST: 24 U/L (ref 0–37)
Albumin: 4 g/dL (ref 3.5–5.2)
Alkaline Phosphatase: 50 U/L (ref 39–117)
BUN: 13 mg/dL (ref 6–23)
CO2: 28 mEq/L (ref 19–32)
Calcium: 9 mg/dL (ref 8.4–10.5)
Chloride: 104 mEq/L (ref 96–112)
Creatinine, Ser: 1.19 mg/dL (ref 0.40–1.50)
GFR: 63.03 mL/min (ref >60.00)
Glucose, Bld: 81 mg/dL (ref 70–99)
Potassium: 4.1 mEq/L (ref 3.5–5.1)
Sodium: 139 mEq/L (ref 135–145)
Total Bilirubin: 0.6 mg/dL (ref 0.2–1.2)
Total Protein: 7.4 g/dL (ref 6.0–8.3)

## 2021-03-13 LAB — LIPID PANEL
Cholesterol: 155 mg/dL (ref 0–200)
HDL: 45 mg/dL (ref >39.00)
LDL Cholesterol: 100 mg/dL — ABNORMAL HIGH (ref 0–99)
NonHDL: 110.25
Total CHOL/HDL Ratio: 3
Triglycerides: 53 mg/dL (ref 0.0–149.0)
VLDL: 10.6 mg/dL (ref 0.0–40.0)

## 2021-03-20 DIAGNOSIS — R7989 Other specified abnormal findings of blood chemistry: Secondary | ICD-10-CM | POA: Diagnosis not present

## 2021-03-29 ENCOUNTER — Other Ambulatory Visit (INDEPENDENT_AMBULATORY_CARE_PROVIDER_SITE_OTHER): Payer: Medicare Other

## 2021-03-29 DIAGNOSIS — Z5181 Encounter for therapeutic drug level monitoring: Secondary | ICD-10-CM | POA: Diagnosis not present

## 2021-03-29 LAB — HEPATIC FUNCTION PANEL
ALT: 45 U/L (ref 0–53)
AST: 29 U/L (ref 0–37)
Albumin: 4.1 g/dL (ref 3.5–5.2)
Alkaline Phosphatase: 52 U/L (ref 39–117)
Bilirubin, Direct: 0.1 mg/dL (ref 0.0–0.3)
Total Bilirubin: 0.5 mg/dL (ref 0.2–1.2)
Total Protein: 7.8 g/dL (ref 6.0–8.3)

## 2021-04-06 DIAGNOSIS — M0579 Rheumatoid arthritis with rheumatoid factor of multiple sites without organ or systems involvement: Secondary | ICD-10-CM | POA: Diagnosis not present

## 2021-04-06 DIAGNOSIS — Z79899 Other long term (current) drug therapy: Secondary | ICD-10-CM | POA: Diagnosis not present

## 2021-04-18 ENCOUNTER — Ambulatory Visit (INDEPENDENT_AMBULATORY_CARE_PROVIDER_SITE_OTHER): Payer: Medicare Other | Admitting: Internal Medicine

## 2021-04-18 ENCOUNTER — Encounter: Payer: Self-pay | Admitting: Internal Medicine

## 2021-04-18 ENCOUNTER — Other Ambulatory Visit: Payer: Self-pay

## 2021-04-18 VITALS — BP 108/70 | HR 86 | Temp 97.2°F | Ht 65.0 in | Wt 130.0 lb

## 2021-04-18 DIAGNOSIS — M0579 Rheumatoid arthritis with rheumatoid factor of multiple sites without organ or systems involvement: Secondary | ICD-10-CM | POA: Diagnosis not present

## 2021-04-18 DIAGNOSIS — J849 Interstitial pulmonary disease, unspecified: Secondary | ICD-10-CM

## 2021-04-18 DIAGNOSIS — Z23 Encounter for immunization: Secondary | ICD-10-CM

## 2021-04-18 DIAGNOSIS — J310 Chronic rhinitis: Secondary | ICD-10-CM | POA: Diagnosis not present

## 2021-04-18 LAB — PULMONARY FUNCTION TEST
DL/VA % pred: 71 %
DL/VA: 3 ml/min/mmHg/L
DLCO unc % pred: 45 %
DLCO unc: 10.08 ml/min/mmHg
FEF 25-75 Pre: 3.12 L/sec
FEF2575-%Pred-Pre: 147 %
FEV1-%Pred-Pre: 67 %
FEV1-Pre: 1.61 L
FEV1FVC-%Pred-Pre: 122 %
FEV6-%Pred-Pre: 56 %
FEV6-Pre: 1.69 L
FEV6FVC-%Pred-Pre: 104 %
FVC-%Pred-Pre: 54 %
FVC-Pre: 1.71 L
Pre FEV1/FVC ratio: 94 %
Pre FEV6/FVC Ratio: 99 %

## 2021-04-18 NOTE — Patient Instructions (Addendum)
Spiro/DLCO performed today. 

## 2021-04-18 NOTE — Patient Instructions (Signed)
Please schedule follow up scheduled with myself in 3 months.  If my schedule is not open yet, we will contact you with a reminder closer to that time.  I will contact Dr. Trudie Reed' office about your methotrexate and get back to you. Keep taking esbriet for now. Your lung function is stable which is good news!  Pick up the ipratropium  nasal spray and see if that helps the drainage in your nose.

## 2021-04-18 NOTE — Progress Notes (Signed)
Earl Thomas    [id number]    [birth year]  Primary Care Physician:Stallings, Arlie Solomons, MD Date of Appointment: 04/18/2021 Established Patient Visit  Chief complaint:   Chief Complaint  Patient presents with   Follow-up    PFT results    HPI: Earl Thomas is a 70 y.o. gentleman with history of RA-ILD. Intolerant of Ofev due to diarrhea. Now on esbriet.   Interval Updates: Here today for follow up for ILD still on esbriet. Still occasional loose stools, but much less frequent. He is having decreased appetite and he has to push himself to eat. He has early satiety. His wife joins over facetime and notes concerns about appetite and weight gain. Denies reflux/heart burn.   On symbicort 2 puffs BID which helps his dyspnea, but not the coughing.   Last simponi infusion was 2 weeks ago. Joint symptoms are mostly in his hands - index and middle finger. Taking meloxicam which helps his joints. His Rheumatologist Dr. Trudie Reed said that his labs on 9/30 showed elevated liver enzymes and his mtx is on hold for 4 weeks.   Still having post nasal drainage, about the same.  He is off flonase. Never picked up the ipratropium,.   I have reviewed the patient's family social and past medical history and updated as appropriate.   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    Past Surgical History:  Procedure Laterality Date   ROTATOR CUFF REPAIR Right 01/02/16    Family History  Problem Relation Age of Onset   Cancer Father 41       prostate cancer   Diabetes Father    Heart disease Father 38       AMI late 37s   Cancer Mother 69       Breast cancer   Heart disease Mother        CABG at age 31   Diabetes Brother    Heart disease Brother        AMI x 2; CABG   Hyperlipidemia Brother     Social History    Occupational History   Occupation: Professor  Tobacco Use   Smoking status: Former    Types: Pipe    Quit date: 10/06/1988    Years since quitting: 32.5   Smokeless tobacco: Never   Tobacco comments:    parent smoked in home as a child.  Smoked a pip in college, not daily.  Vaping Use   Vaping Use: Never used  Substance and Sexual Activity   Alcohol use: No   Drug use: No   Sexual activity: Yes    Physical Exam: Blood pressure 108/70, pulse 86, temperature (!) 97.2 F (36.2 C), temperature source Oral, height _0  (1.651 m), weight 130 lb (59 kg), SpO2 99 %.  Gen:     Thin, well appearing, no distress Lungs:    bibasilar crackles CV:         rrr no mrg MSK:     No acute synovitis, no erythematous or swollen joints,   Data Reviewed: Imaging: I have personally reviewed the CT scan from Nov 2020 which demonstrates bibasilar honeycombing consistent with UIP pattern.  PFTs:   PFT Results Latest Ref Rng & Units 04/18/2021 11/13/2020 08/07/2020 07/26/2019  FVC-Pre L 1.71 1.71 1.37 1.52  FVC-Predicted Pre % 54 54 43 47  FVC-Post L - - - 1.82  FVC-Predicted Post % - - - 57  Pre FEV1/FVC % % 94 92 100 100  Post FEV1/FCV % % - - - 88  FEV1-Pre L 1.61 1.57 1.37 1.52  FEV1-Predicted Pre % 67 65 57 63  FEV1-Post L - - - 1.61  DLCO uncorrected ml/min/mmHg 10.08 11.16 9.68 11.79  DLCO UNC% % 45 49 42 52  DLCO corrected ml/min/mmHg - 11.16 9.68 -  DLCO COR %Predicted % - 49 42 -  DLVA Predicted % 71 104 73 82  TLC L - - - 3.42  TLC % Predicted % - - - 57  RV % Predicted % - - - 84   I have personally reviewed the patient's PFTs and which show moderate restriction with an FVC of 54% which is stable from previous PFTs.    Labs: I have reviewed his outside hospital labs noted on March 23 2019 progress note, his CCP level was greater than 255, Rheumatoid factor was 149 his, ESR was 109 his uric acid was normal. LFTs reviewed 03/29/21 shows normal liver function.     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   Plan/Recommendations: Stable FVC. Having some side effects from [place] but they are tolerable- mostly diarrhea which is occasional.   LFTs from our labs show normal liver function. Apparently Dr. Trudie Reed drew labs a week later and was concerned for transaminitis, I have a call out to her to discuss. She has stopped his methotrexate and may want to stop his esbriet. I would like to continue esbriet for now as it is clearly stabilizing his lung function.  If mtx is stopped I would prefer imuran. Will wait to hear back from Dr Trudie Reed.   He never picked up ipratropium so I have called it in again to try for rhinitis.  Continue singulair. Continue to abstain from flonase for now.    Recommend smaller more frequent meals. Denies reflux symptoms.   Continue LFTs routine for esbriet.   Return to Care: Return in about 3 months (around 07/19/2021).   Lenice Llamas, MD [facility] and [place]

## 2021-04-18 NOTE — Progress Notes (Signed)
Spiro/DLCO performed today. 

## 2021-04-24 DIAGNOSIS — Z23 Encounter for immunization: Secondary | ICD-10-CM | POA: Diagnosis not present

## 2021-05-08 DIAGNOSIS — R7989 Other specified abnormal findings of blood chemistry: Secondary | ICD-10-CM | POA: Diagnosis not present

## 2021-05-08 DIAGNOSIS — Z79899 Other long term (current) drug therapy: Secondary | ICD-10-CM | POA: Diagnosis not present

## 2021-06-05 DIAGNOSIS — Z79899 Other long term (current) drug therapy: Secondary | ICD-10-CM | POA: Diagnosis not present

## 2021-06-05 DIAGNOSIS — M0579 Rheumatoid arthritis with rheumatoid factor of multiple sites without organ or systems involvement: Secondary | ICD-10-CM | POA: Diagnosis not present

## 2021-06-06 ENCOUNTER — Ambulatory Visit (INDEPENDENT_AMBULATORY_CARE_PROVIDER_SITE_OTHER): Payer: Medicare Other | Admitting: Emergency Medicine

## 2021-06-06 ENCOUNTER — Other Ambulatory Visit: Payer: Self-pay

## 2021-06-06 ENCOUNTER — Encounter: Payer: Self-pay | Admitting: Emergency Medicine

## 2021-06-06 VITALS — BP 110/70 | HR 84 | Ht 65.0 in | Wt 133.0 lb

## 2021-06-06 DIAGNOSIS — Z79899 Other long term (current) drug therapy: Secondary | ICD-10-CM

## 2021-06-06 DIAGNOSIS — M0510 Rheumatoid lung disease with rheumatoid arthritis of unspecified site: Secondary | ICD-10-CM | POA: Diagnosis not present

## 2021-06-06 DIAGNOSIS — Z7689 Persons encountering health services in other specified circumstances: Secondary | ICD-10-CM | POA: Diagnosis not present

## 2021-06-06 DIAGNOSIS — J849 Interstitial pulmonary disease, unspecified: Secondary | ICD-10-CM

## 2021-06-06 DIAGNOSIS — E785 Hyperlipidemia, unspecified: Secondary | ICD-10-CM

## 2021-06-06 MED ORDER — BUDESONIDE-FORMOTEROL FUMARATE 80-4.5 MCG/ACT IN AERO: 2.0000 | INHALATION_SPRAY | Freq: Two times a day (BID) | RESPIRATORY_TRACT | 3 refills | Status: DC

## 2021-06-06 NOTE — Assessment & Plan Note (Signed)
Stable.  On atorvastatin 20 mg daily. The 10-year ASCVD risk score (Arnett DK, et al., 2019) is: 13.7%   Values used to calculate the score:     Age: 70 years     Sex: Male     Is Non-Hispanic African American: Yes     Diabetic: No     Tobacco smoker: Yes     Systolic Blood Pressure: 334 mmHg     Is BP treated: No     HDL Cholesterol: 45 mg/dL     Total Cholesterol: 155 mg/dL

## 2021-06-06 NOTE — Progress Notes (Addendum)
Earl Thomas 70 y.o.   Chief Complaint  Patient presents with   Transitions Of Care    Manage chronic condition, pt would like check up    HISTORY OF PRESENT ILLNESS: This is a 70 y.o. male former patient of Dr. Nolon Rod here to establish care with me. Has the following chronic medical problems: 1.  Rheumatoid arthritis on several medications.  Sees rheumatologist Dr. Brock Bad on a regular basis.  On weekly Rheumatrex and golimumab infusion every 8 weeks.  Uses NSAIDs and or prednisone for flareups.  2.  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  Sees pulmonologist on a regular basis.  Recently started on Esbriet. Presently on Symbicort twice a day.  No need for rescue inhaler albuterol. 3.  Dyslipidemia: On atorvastatin 20 mg daily. Stable.  No complaints or any other medical concerns today.  HPI   Prior to Admission medications   Medication Sig Start Date End Date Taking? Authorizing Provider  albuterol (VENTOLIN HFA) 108 (90 Base) MCG/ACT inhaler Inhale 2 puffs into the lungs every 6 (six) hours as needed for wheezing or shortness of breath. 06/09/19  Yes Delia Chimes A, MD  aspirin EC 81 MG tablet Take 81 mg by mouth daily.   Yes [provider]  atorvastatin (LIPITOR) 20 MG tablet Take 1 tablet (20 mg total) by mouth daily at 6 PM. 02/27/21  Yes Wendie Agreste, MD  diclofenac sodium (VOLTAREN) 1 % GEL Apply 2 g topically 4 (four) times daily. 09/18/18  Yes Stallings, Zoe A, MD  ESBRIET 267 MG TABS TAKE 3 TABLETS (801 MG TOTAL) BY MOUTH WITH BREAKFAST, WITH LUNCH, AND with evening meal. 08/09/20  Yes Spero Geralds, MD  folic acid ([name]) 1 MG tablet Take 1 mg by mouth daily. 08/27/19  Yes [provider]  Golimumab ([place] ARIA IV) Inject into the vein. Every 8 weeks infusion   Yes [provider]  hydrocortisone (PROCTOSOL HC) 2.5 % rectal cream APPLY ONE APPLICATORFUL RECTALLY TWICE DAILY as needed for hemorrhoid  pain/itching/bleeding 11/12/17  Yes Wardell Honour, MD  ipratropium (ATROVENT) 0.06 % nasal spray Place 1 spray into both nostrils 3 (three) times daily. 11/13/20  Yes Spero Geralds, MD  loperamide (IMODIUM) 2 MG capsule Take 2 mg by mouth as needed for diarrhea or loose stools.   Yes [provider]  meloxicam (MOBIC) 7.5 MG tablet Take 1 tablet (7.5 mg total) by mouth daily as needed for pain. 05/14/19  Yes Stallings, Zoe A, MD  methotrexate (RHEUMATREX) 10 MG tablet Take 10 mg by mouth once a week. Caution: Chemotherapy. Protect from light.   Yes [provider]  tadalafil (ADCIRCA/CIALIS) 20 MG tablet Take 0.5 tablets (10 mg total) by mouth every other day as needed for erectile dysfunction. 07/16/18  Yes Forrest Moron, MD  budesonide-formoterol (SYMBICORT) 80-4.5 MCG/ACT inhaler Inhale 2 puffs into the lungs 2 (two) times daily. 06/06/21   Horald Pollen, MD  fluticasone [facility]) 50 MCG/ACT nasal spray Place 1 spray into both nostrils daily. Patient not taking: Reported on 06/06/2021 08/22/20   Spero Geralds, MD  montelukast (SINGULAIR) 10 MG tablet Take 1 tablet (10 mg total) by mouth daily. Patient not taking: Reported on 04/18/2021 04/27/20   Spero Geralds, MD    No Known Allergies  Patient Active Problem List   Diagnosis Date Noted   ILD (interstitial lung disease) ([place]) 07/16/2019   Rheumatoid lung disease with rheumatoid arthritis ([place]) 07/16/2019  Long-term current use of high risk medication other than anticoagulant 07/16/2019   BMI 26.0-26.9,adult 10/22/2016   Renal insufficiency 04/29/2016   Glucose intolerance (impaired glucose tolerance) 04/29/2016   Hyperlipidemia 03/10/2012    Past Medical History:  Diagnosis Date   Arthritis    Glucose intolerance (impaired glucose tolerance)    Hyperlipidemia    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    Past Surgical History:  Procedure Laterality Date   ROTATOR CUFF  REPAIR Right 01/02/16    Social History   Socioeconomic History   Marital status: Married    Spouse name: Not on file   Number of children: 2   Years of education: Not on file   Highest education level: Not on file  Occupational History   Occupation: Professor  Tobacco Use   Smoking status: Former    Types: Pipe    Quit date: 10/06/1988    Years since quitting: 32.6   Smokeless tobacco: Never   Tobacco comments:    parent smoked in home as a child.  Smoked a pip in college, not daily.  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      Tobacco: pipe in 1980s      Alcohol: none      Exercise: joined [employer]; going 2-3 times per week.        Seatbelt:  100%   Social Determinants of Radio broadcast assistant Strain: Not on file  Food Insecurity: Not on file  Transportation Needs: Not on file  Physical Activity: Not on file  Stress: Not on file  Social Connections: Not on file  Intimate Partner Violence: Not on file    Family History  Problem Relation Age of Onset   Cancer Father 29       prostate cancer   Diabetes Father    Heart disease Father 73       AMI late 69s   Cancer Mother 65       Breast cancer   Heart disease Mother        CABG at age 7   Diabetes Brother    Heart disease Brother        AMI x 2; CABG   Hyperlipidemia Brother      Review of Systems  Constitutional: Negative.  Negative for chills and fever.  HENT: Negative.  Negative for congestion and sore throat.   Respiratory: Negative.  Negative for cough and  shortness of breath.   Cardiovascular: Negative.  Negative for chest pain and palpitations.  Gastrointestinal: Negative.  Negative for abdominal pain, diarrhea, nausea and vomiting.  Genitourinary: Negative.  Negative for dysuria and hematuria.  Musculoskeletal:  Positive for joint pain.  Skin: Negative.  Negative for rash.  Neurological: Negative.  Negative for dizziness and headaches.  All other systems reviewed and are negative.   Physical Exam Vitals reviewed.  Constitutional:      Appearance: Normal appearance.  HENT:     Head:  Normocephalic.     Mouth/Throat:     Mouth: Mucous membranes are moist.     Pharynx: Oropharynx is clear.  Eyes:     Extraocular Movements: Extraocular movements intact.     Conjunctiva/sclera: Conjunctivae normal.     Pupils: Pupils are equal, round, and reactive to light.  Cardiovascular:     Rate and Rhythm: Normal rate and regular rhythm.     Pulses: Normal pulses.     Heart sounds: Normal heart sounds.  Pulmonary:     Effort: Pulmonary effort is normal.     Breath sounds: Rales (Dry crackles at both bases) present.  Musculoskeletal:        General: No swelling, tenderness or deformity.     Cervical back: Normal range of motion and neck supple. No tenderness.     Right lower leg: No edema.     Left lower leg: No edema.  Lymphadenopathy:     Cervical: No cervical adenopathy.  Skin:    General: Skin is warm and dry.     Capillary Refill: Capillary refill takes less than 2 seconds.  Neurological:     General: No focal deficit present.     Mental Status: He is alert and oriented to person, place, and time.  Psychiatric:        Mood and Affect: Mood normal.        Behavior: Behavior normal.     ASSESSMENT & PLAN: Problem List Items Addressed This Visit       Respiratory   ILD (interstitial lung disease) ([place]) (Chronic)   Rheumatoid lung disease with rheumatoid arthritis ([place]) - Primary    Stable.  On multiple medications. Sees  rheumatologist and pulmonary doctor on a regular basis.        Other   Dyslipidemia    Stable.  On atorvastatin 20 mg daily. The 10-year ASCVD risk score (Arnett DK, et al., 2019) is: 13.7%   Values used to calculate the score:     Age: 15 years     Sex: Male     Is Non-Hispanic African American: Yes     Diabetic: No     Tobacco smoker: Yes     Systolic Blood Pressure: 614 mmHg     Is BP treated: No     HDL Cholesterol: 45 mg/dL     Total Cholesterol: 155 mg/dL       Long-term current use of high risk medication other than anticoagulant   Other Visit Diagnoses     Encounter to establish care          Patient Instructions  Health Maintenance After Age 21 After age 106, you are at a higher risk for certain long-term diseases and infections as well as injuries from falls. Falls are a major cause of broken bones and head injuries in people who are older than age 20. Getting regular preventive care can help to keep you healthy and well. Preventive care includes getting regular testing and making lifestyle changes as recommended by your health care provider. Talk with your health care provider about: Which screenings and tests you should have. A screening is a test that checks for a disease when you have no symptoms. A diet and exercise plan that is right for you. What should I know about screenings and tests to prevent falls? Screening and testing are the best ways to find a health problem early. Early diagnosis and treatment give you the best chance of managing medical conditions that are  common after age 82. Certain conditions and lifestyle choices may make you more likely to have a fall. Your health care provider may recommend: Regular vision checks. Poor vision and conditions such as cataracts can make you more likely to have a fall. If you wear glasses, make sure to get your prescription updated if your vision changes. Medicine review. Work with your health care provider to  regularly review all of the medicines you are taking, including over-the-counter medicines. Ask your health care provider about any side effects that may make you more likely to have a fall. Tell your health care provider if any medicines that you take make you feel dizzy or sleepy. Strength and balance checks. Your health care provider may recommend certain tests to check your strength and balance while standing, walking, or changing positions. Foot health exam. Foot pain and numbness, as well as not wearing proper footwear, can make you more likely to have a fall. Screenings, including: Osteoporosis screening. Osteoporosis is a condition that causes the bones to get weaker and break more easily. Blood pressure screening. Blood pressure changes and medicines to control blood pressure can make you feel dizzy. Depression screening. You may be more likely to have a fall if you have a fear of falling, feel depressed, or feel unable to do activities that you used to do. Alcohol use screening. Using too much alcohol can affect your balance and may make you more likely to have a fall. Follow these instructions at home: Lifestyle Do not drink alcohol if: Your health care provider tells you not to drink. If you drink alcohol: Limit how much you have to: 0-1 drink a day for women. 0-2 drinks a day for men. Know how much alcohol is in your drink. In the [place] one drink equals one 12 oz bottle of beer (355 mL), one 5 oz glass of wine (148 mL), or one 1 oz glass of hard liquor (44 mL). Do not use any products that contain nicotine or tobacco. These products include cigarettes, chewing tobacco, and vaping devices, such as e-cigarettes. If you need help quitting, ask your health care provider. Activity  Follow a regular exercise program to stay fit. This will help you maintain your balance. Ask your health care provider what types of exercise are appropriate for you. If you need a cane or walker, use it as  recommended by your health care provider. Wear supportive shoes that have nonskid soles. Safety  Remove any tripping hazards, such as rugs, cords, and clutter. Install safety equipment such as grab bars in bathrooms and safety rails on stairs. Keep rooms and walkways well-lit. General instructions Talk with your health care provider about your risks for falling. Tell your health care provider if: You fall. Be sure to tell your health care provider about all falls, even ones that seem minor. You feel dizzy, tiredness (fatigue), or off-balance. Take over-the-counter and prescription medicines only as told by your health care provider. These include supplements. Eat a healthy diet and maintain a healthy weight. A healthy diet includes low-fat dairy products, low-fat (lean) meats, and fiber from whole grains, beans, and lots of fruits and vegetables. Stay current with your vaccines. Schedule regular health, dental, and eye exams. Summary Having a healthy lifestyle and getting preventive care can help to protect your health and wellness after age 72. Screening and testing are the best way to find a health problem early and help you avoid having a fall. Early diagnosis and treatment give you the  best chance for managing medical conditions that are more common for people who are older than age 44. Falls are a major cause of broken bones and head injuries in people who are older than age 43. Take precautions to prevent a fall at home. Work with your health care provider to learn what changes you can make to improve your health and wellness and to prevent falls. This information is not intended to replace advice given to you by your health care provider. Make sure you discuss any questions you have with your health care provider. Document Revised: 11/13/2020 Document Reviewed: 11/13/2020 Elsevier Patient Education  2022 [place], MD [facility]

## 2021-06-06 NOTE — Patient Instructions (Signed)
Health Maintenance After Age 68 After age 68, you are at a higher risk for certain long-term diseases and infections as well as injuries from falls. Falls are a major cause of broken bones and head injuries in people who are older than age 68. Getting regular preventive care can help to keep you healthy and well. Preventive care includes getting regular testing and making lifestyle changes as recommended by your health care provider. Talk with your health care provider about: Which screenings and tests you should have. A screening is a test that checks for a disease when you have no symptoms. A diet and exercise plan that is right for you. What should I know about screenings and tests to prevent falls? Screening and testing are the best ways to find a health problem early. Early diagnosis and treatment give you the best chance of managing medical conditions that are common after age 68. Certain conditions and lifestyle choices may make you more likely to have a fall. Your health care provider may recommend: Regular vision checks. Poor vision and conditions such as cataracts can make you more likely to have a fall. If you wear glasses, make sure to get your prescription updated if your vision changes. Medicine review. Work with your health care provider to regularly review all of the medicines you are taking, including over-the-counter medicines. Ask your health care provider about any side effects that may make you more likely to have a fall. Tell your health care provider if any medicines that you take make you feel dizzy or sleepy. Strength and balance checks. Your health care provider may recommend certain tests to check your strength and balance while standing, walking, or changing positions. Foot health exam. Foot pain and numbness, as well as not wearing proper footwear, can make you more likely to have a fall. Screenings, including: Osteoporosis screening. Osteoporosis is a condition that causes  the bones to get weaker and break more easily. Blood pressure screening. Blood pressure changes and medicines to control blood pressure can make you feel dizzy. Depression screening. You may be more likely to have a fall if you have a fear of falling, feel depressed, or feel unable to do activities that you used to do. Alcohol use screening. Using too much alcohol can affect your balance and may make you more likely to have a fall. Follow these instructions at home: Lifestyle Do not drink alcohol if: Your health care provider tells you not to drink. If you drink alcohol: Limit how much you have to: 0-1 drink a day for women. 0-2 drinks a day for men. Know how much alcohol is in your drink. In the [place] one drink equals one 12 oz bottle of beer (355 mL), one 5 oz glass of wine (148 mL), or one 1 oz glass of hard liquor (44 mL). Do not use any products that contain nicotine or tobacco. These products include cigarettes, chewing tobacco, and vaping devices, such as e-cigarettes. If you need help quitting, ask your health care provider. Activity  Follow a regular exercise program to stay fit. This will help you maintain your balance. Ask your health care provider what types of exercise are appropriate for you. If you need a cane or walker, use it as recommended by your health care provider. Wear supportive shoes that have nonskid soles. Safety  Remove any tripping hazards, such as rugs, cords, and clutter. Install safety equipment such as grab bars in bathrooms and safety rails on stairs. Keep rooms and walkways   well-lit. General instructions Talk with your health care provider about your risks for falling. Tell your health care provider if: You fall. Be sure to tell your health care provider about all falls, even ones that seem minor. You feel dizzy, tiredness (fatigue), or off-balance. Take over-the-counter and prescription medicines only as told by your health care provider. These include  supplements. Eat a healthy diet and maintain a healthy weight. A healthy diet includes low-fat dairy products, low-fat (lean) meats, and fiber from whole grains, beans, and lots of fruits and vegetables. Stay current with your vaccines. Schedule regular health, dental, and eye exams. Summary Having a healthy lifestyle and getting preventive care can help to protect your health and wellness after age 91. Screening and testing are the best way to find a health problem early and help you avoid having a fall. Early diagnosis and treatment give you the best chance for managing medical conditions that are more common for people who are older than age 48. Falls are a major cause of broken bones and head injuries in people who are older than age 5. Take precautions to prevent a fall at home. Work with your health care provider to learn what changes you can make to improve your health and wellness and to prevent falls. This information is not intended to replace advice given to you by your health care provider. Make sure you discuss any questions you have with your health care provider. Document Revised: 11/13/2020 Document Reviewed: 11/13/2020 Elsevier Patient Education  [place].

## 2021-06-06 NOTE — Assessment & Plan Note (Signed)
Stable.  On multiple medications. Sees rheumatologist and pulmonary doctor on a regular basis.

## 2021-06-11 ENCOUNTER — Encounter: Payer: Medicare Other | Admitting: Family Medicine

## 2021-07-19 ENCOUNTER — Ambulatory Visit (INDEPENDENT_AMBULATORY_CARE_PROVIDER_SITE_OTHER): Payer: Medicare Other | Admitting: Internal Medicine

## 2021-07-19 ENCOUNTER — Encounter: Payer: Self-pay | Admitting: Internal Medicine

## 2021-07-19 ENCOUNTER — Other Ambulatory Visit: Payer: Self-pay

## 2021-07-19 VITALS — BP 104/60 | HR 79 | Temp 98.2°F | Ht 65.0 in | Wt 128.2 lb

## 2021-07-19 DIAGNOSIS — M0510 Rheumatoid lung disease with rheumatoid arthritis of unspecified site: Secondary | ICD-10-CM | POA: Diagnosis not present

## 2021-07-19 DIAGNOSIS — M0579 Rheumatoid arthritis with rheumatoid factor of multiple sites without organ or systems involvement: Secondary | ICD-10-CM | POA: Diagnosis not present

## 2021-07-19 DIAGNOSIS — J84112 Idiopathic pulmonary fibrosis: Secondary | ICD-10-CM

## 2021-07-19 DIAGNOSIS — J310 Chronic rhinitis: Secondary | ICD-10-CM

## 2021-07-19 MED ORDER — ALBUTEROL SULFATE HFA 108 (90 BASE) MCG/ACT IN AERS: 2.0000 | INHALATION_SPRAY | Freq: Four times a day (QID) | RESPIRATORY_TRACT | 5 refills | Status: DC | PRN

## 2021-07-19 NOTE — Progress Notes (Signed)
Earl Thomas    [id number]    [birth year]  Primary Care Physician:Sagardia, Ines Bloomer, MD Date of Appointment: 07/19/2021 Established Patient Visit  Chief complaint:   Chief Complaint  Patient presents with   Follow-up    ILD    HPI: Osborne Serio is a 71 y.o. gentleman with history of RA-ILD. Intolerant of Ofev due to diarrhea. On esbriet.   Interval Updates: Here today for follow up for ILD. Methotrexate dosing reduced to 6 mg weekly due to transaminitis.  Esbriet continued..  Still on simponi. Having worsening arthralgias in his MCP joints. Meloxicam and prednisone for this.   Still on symbicort 2 puffs BID. No thrush.    Taking ipratropium nasal spray which did help his rhinitis.   I have reviewed the patient's family social and past medical history and updated as appropriate.   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    Past Surgical History:  Procedure Laterality Date   ROTATOR CUFF REPAIR Right 01/02/16    Family History  Problem Relation Age of Onset   Cancer Father 38       prostate cancer   Diabetes Father    Heart disease Father 56       AMI late 69s   Cancer Mother 79       Breast cancer   Heart disease Mother        CABG at age 76   Diabetes Brother    Heart disease Brother        AMI x 2; CABG   Hyperlipidemia Brother     Social History   Occupational History   Occupation: Professor  Tobacco Use   Smoking status: Former    Types: Pipe    Quit date: 10/06/1988    Years since quitting: 32.8   Smokeless tobacco: Never   Tobacco comments:    parent smoked in home as a child.  Smoked a pip in college, not daily.  Vaping Use   Vaping Use: Never used  Substance and Sexual Activity   Alcohol use: No   Drug use: No   Sexual activity: Yes    Physical Exam: Blood  pressure 104/60, pulse 79, temperature 98.2 F (36.8 C), temperature source Oral, height 5' 5" (1.651 m), weight 128 lb 3.2 oz (58.2 kg), SpO2 97 %.  Gen:     Thin, well appearing, no distress Lungs:    bibasilar crackles, no increased work of breathing CV:     RRR no mrg MSK:     right third MCP digit is edematous and tender   Data Reviewed: Imaging: I have personally reviewed the CT scan from Nov 2020 which demonstrates bibasilar honeycombing consistent with UIP pattern.  PFTs:   PFT Results Latest Ref Rng & Units 04/18/2021 11/13/2020 08/07/2020 07/26/2019  FVC-Pre L 1.71 1.71 1.37 1.52  FVC-Predicted Pre % 54 54 43 47  FVC-Post L - - - 1.82  FVC-Predicted Post % - - - 57  Pre FEV1/FVC % % 94 92 100 100  Post FEV1/FCV % % - - - 88  FEV1-Pre L 1.61 1.57 1.37 1.52  FEV1-Predicted Pre % 67 65 57 63  FEV1-Post L - - - 1.61  DLCO uncorrected ml/min/mmHg 10.08 11.16 9.68 11.79  DLCO UNC% % 45 49 42 52  DLCO corrected ml/min/mmHg - 11.16 9.68 -  DLCO COR %Predicted % - 49 42 -  DLVA Predicted % 71 104 73 82  TLC L - - - 3.42  TLC % Predicted % - - - 57  RV % Predicted % - - - 84   I have personally reviewed the patient's PFTs and which show moderate restriction with an FVC of 54% which is stable from previous PFTs.    Labs: I have reviewed his outside hospital labs noted on March 23 2019 progress note, his CCP level was greater than 255, Rheumatoid factor was 149 his, ESR was 109 his uric acid was normal. LFTs reviewed 03/29/21 shows normal liver function.   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  Plan/Recommendations: Stable FVC. Having some side effects from Esbriet but they are tolerable- mostly diarrhea which is occasional.  Will repeat pfts at next visit.   Continue esbriet for now. LFTs most recently reviewed in December were okay.   Continue singulair and ipratropium. Recommend smaller more frequent meals. Denies reflux symptoms.   Continue LFTs routine for esbriet.   Sees Leafy Kindle on Feb 2nd. I would ask her to reach out to me and route her letter to me after a visit to keep me abreast of nany   Return to Care: Return in about 3 months (around 10/17/2021).   Lenice Llamas, MD [facility] [place]

## 2021-07-19 NOTE — Patient Instructions (Addendum)
Please schedule follow up scheduled with myself in 3 months.  If my schedule is not open yet, we will contact you with a reminder closer to that time. Please call [phone] if you haven't heard from [place] a month before.   Before your next visit I would like you to have: Spirometry/DLCO - 30 minutes prior to next appointment  Reach out to Marlane Mingle he is a pulmonary fibrosis patient advocate. Pulmonary fibrosis support group. His email is [email or web address]  I will reach out to you after I speak with [name] or Dr. Trudie Reed if there is any change in your plan.  Continue esbriet for now. Labs look ok.

## 2021-07-23 ENCOUNTER — Encounter: Payer: Self-pay | Admitting: Internal Medicine

## 2021-07-26 NOTE — Telephone Encounter (Signed)
Please advise on message below from patient  Dr. Shearon Stalls I am coughing up phlegm and I didn't mention it the other day. I mentioned that I  cough and you said I would continue to have a cough due to the lung. But I was wondering about the phlegm that comes up and didn't know if an antiobiotic was needed. Typically it is in the mornings and throughout the day but not always phlegm comes out. Marita Snellen

## 2021-07-30 ENCOUNTER — Encounter: Payer: Self-pay | Admitting: Internal Medicine

## 2021-07-30 DIAGNOSIS — J849 Interstitial pulmonary disease, unspecified: Secondary | ICD-10-CM

## 2021-07-31 DIAGNOSIS — M0579 Rheumatoid arthritis with rheumatoid factor of multiple sites without organ or systems involvement: Secondary | ICD-10-CM | POA: Diagnosis not present

## 2021-07-31 DIAGNOSIS — Z79899 Other long term (current) drug therapy: Secondary | ICD-10-CM | POA: Diagnosis not present

## 2021-08-03 NOTE — Telephone Encounter (Signed)
Called patient and discussed symptoms. Does not appear to be infectious. Appears gradual over the last few weeks. He did not mention this at his appointment. Lung function is actually stable. Now he is losing weight. Will get more data and repeat CT Chest. Will contact him with next steps.

## 2021-08-03 NOTE — Telephone Encounter (Signed)
Called patient's cell phone and left message. Called home phone and his wife picked up. She recommend I call back after 3pm, I will call back then.

## 2021-08-03 NOTE — Telephone Encounter (Signed)
Called and spoke with patient. He stated that for the past few days he has noticed an increase in his SOB, especially with exertion. In the mornings while trying to get his day started, he has to sit down and take several breaks due to the coughing and SOB. Once he has sat down for a few minutes, the SOB and cough will calm down. The cough is sometimes productive with thick, brown phlegm. He denied any wheezing or chest tightness. Also denied any fevers or body aches. He is still using the Symbicort and albuterol inhalers. He wanted to know if the cough and SOB was related to the IPF.   Dr. Shearon Stalls, can you please advise? Thanks!

## 2021-08-09 ENCOUNTER — Other Ambulatory Visit: Payer: Self-pay

## 2021-08-09 ENCOUNTER — Ambulatory Visit (HOSPITAL_BASED_OUTPATIENT_CLINIC_OR_DEPARTMENT_OTHER)
Admission: RE | Admit: 2021-08-09 | Discharge: 2021-08-09 | Disposition: A | Discharge status: Home / Self Care | Payer: Medicare Other | Source: Ambulatory Visit | Attending: Internal Medicine | Admitting: Internal Medicine

## 2021-08-09 DIAGNOSIS — I2510 Atherosclerotic heart disease of native coronary artery without angina pectoris: Secondary | ICD-10-CM | POA: Diagnosis not present

## 2021-08-09 DIAGNOSIS — J84112 Idiopathic pulmonary fibrosis: Secondary | ICD-10-CM | POA: Diagnosis not present

## 2021-08-09 DIAGNOSIS — J849 Interstitial pulmonary disease, unspecified: Secondary | ICD-10-CM | POA: Insufficient documentation

## 2021-08-09 DIAGNOSIS — K449 Diaphragmatic hernia without obstruction or gangrene: Secondary | ICD-10-CM | POA: Diagnosis not present

## 2021-08-09 DIAGNOSIS — R634 Abnormal weight loss: Secondary | ICD-10-CM | POA: Diagnosis not present

## 2021-08-14 DIAGNOSIS — M0579 Rheumatoid arthritis with rheumatoid factor of multiple sites without organ or systems involvement: Secondary | ICD-10-CM | POA: Diagnosis not present

## 2021-08-14 DIAGNOSIS — Z79899 Other long term (current) drug therapy: Secondary | ICD-10-CM | POA: Diagnosis not present

## 2021-08-14 DIAGNOSIS — Z6820 Body mass index (BMI) 20.0-20.9, adult: Secondary | ICD-10-CM | POA: Diagnosis not present

## 2021-08-14 DIAGNOSIS — M2550 Pain in unspecified joint: Secondary | ICD-10-CM | POA: Diagnosis not present

## 2021-08-14 DIAGNOSIS — J849 Interstitial pulmonary disease, unspecified: Secondary | ICD-10-CM | POA: Diagnosis not present

## 2021-08-21 ENCOUNTER — Encounter: Payer: Self-pay | Admitting: Internal Medicine

## 2021-08-22 NOTE — Progress Notes (Signed)
Interstitial Lung Disease Multidisciplinary Conference   Earl Thomas    MRN [id number]    DOB 1950-09-28  Primary Care Physician:Sagardia, Ines Bloomer, MD  Referring Physician: Dr Lenice Llamas  Time of Conference: 7.30am- 8.30am Date of conference: 08/21/21 tue Location of Conference: -  Virtual  Participating Pulmonary: Dr. Brand Males, MD - yes,  Dr Marshell Garfinkel, MD - yes Pathology: Dr Jaquita Folds, MD - no , Dr Enid Cutter - no Radiology:  Dr Eddie Candle - yes Others: Nate Meier and Madie Reno MD  Brief History: Has RA-ILD with UIP. He has been intolerant of ofev and so we switched to Esbriet which is he taking at reduced dose. Also on methotrexate (reduced dose due to transaminitis) and prednisone for uncontrolled joint symptoms from RA. Has had stable FVC but worsening symptoms. I repeated a scan and his fibrosis has progressed. What treatment options to consider next?  Serology: HAS RA  MDD discussion of CT scan    - Date or time period of scan: "HRCT: 08/09/2021 HRCT: 02/09/2020 HRCT: 06/02/2019"  - Features mentioned:  CT is classic for UIP severe disease. Substantially worse over time.   - What is the final conclusion per 2018 ATS/Fleischner Criteria - RA-ILD UIP with progression over time on CT  Pathology discussion of biopsy NO BIOPSY:  PFTs:  PFT Results Latest Ref Rng & Units 04/18/2021 11/13/2020 08/07/2020 07/26/2019  FVC-Pre L 1.71 1.71 1.37 1.52  FVC-Predicted Pre % 54 54 43 47  FVC-Post L - - - 1.82  FVC-Predicted Post % - - - 57  Pre FEV1/FVC % % 94 92 100 100  Post FEV1/FCV % % - - - 88  FEV1-Pre L 1.61 1.57 1.37 1.52  FEV1-Predicted Pre % 67 65 57 63  FEV1-Post L - - - 1.61  DLCO uncorrected ml/min/mmHg 10.08 11.16 9.68 11.79  DLCO UNC% % 45 49 42 52  DLCO corrected ml/min/mmHg - 11.16 9.68 -  DLCO COR %Predicted % - 49 42 -  DLVA Predicted % 71 104 73 82  TLC L - - - 3.42  TLC % Predicted % - - - 57  RV % Predicted % - -  - 84     Labs: *PFT appears stable  MDD Impression/Recs: PRogressive phenotye RA-ILD on CT scan  Look for pulm hypertension. can qualify for tyvaso.   For ILD - consider other immune modulators -> immuran, rituxan, cellcept with cellcept not being able to control joint paint.   Also, transplant referral  Participatin in ILD-PRO registry for progressie phenotype. -> Later in clinical trial (summer 2023 a [facility] squibb trial)   Time Spent in preparation and discussion:  > 30 min    SIGNATURE   Dr. Brand Males, M.D., F.C.C.P,  Pulmonary and Critical Care Medicine Staff Physician, [place] Director - Interstitial Lung Disease  Program  [facility] [place], [place], [address]  Pager: [phone], If no answer or between  15:00h - 7:00h: call [phone] Telephone: [phone]  6:53 AM 08/22/2021 ...................................................................................................................Marland Kitchen References: Diagnosis of Hypersensitivity Pneumonitis in Adults. An Official ATS/JRS/ALAT Clinical Practice Guideline. Ragu G et al, [place] Aug 1;202(3):e36-e69.       Diagnosis of Idiopathic Pulmonary Fibrosis. An Official ATS/ERS/JRS/ALAT Clinical Practice Guideline. Raghu G et al, [place]. 2018 Sep 1;198(5):e44-e68.   IPF Suspected   Histopath  ology Pattern      UIP  Probable UIP  Indeterminate for  UIP  Alternative  diagnosis    UIP  IPF  IPF  IPF  Non-IPF dx   HRCT   Probabe UIP  IPF  IPF  IPF (Likely)**  Non-IPF dx  Pattern  Indeterminate for UIP  IPF  IPF (Likely)**  Indeterminate  for IPF**  Non-IPF dx    Alternative diagnosis  IPF (Likely)**/ non-IPF dx  Non-IPF dx  Non-IPF dx  Non-IPF dx     Idiopathic pulmonary fibrosis diagnosis based upon HRCT and Biopsy paterns.  ** IPF is the likely  diagnosis when any of following features are present:  Moderate-to-severe traction bronchiectasis/bronchiolectasis (defined as mild traction bronchiectasis/bronchiolectasis in four or more lobes including the lingual as a lobe, or moderate to severe traction bronchiectasis in two or more lobes) in a man over age 5 years or in a woman over age 61 years Extensive (>30%) reticulation on HRCT and an age >70 years  Increased neutrophils and/or absence of lymphocytosis in BAL fluid  Multidisciplinary discussion reaches a confident diagnosis of IPF.   **Indeterminate for IPF  Without an adequate biopsy is unlikely to be IPF  With an adequate biopsy may be reclassified to a more specific diagnosis after multidisciplinary discussion and/or additional consultation.   dx = diagnosis; HRCT = high-resolution computed tomography; IPF = idiopathic pulmonary fibrosis; UIP = usual interstitial pneumonia.

## 2021-08-27 ENCOUNTER — Telehealth: Payer: Self-pay | Admitting: Internal Medicine

## 2021-08-27 DIAGNOSIS — R0602 Shortness of breath: Secondary | ICD-10-CM

## 2021-08-27 DIAGNOSIS — J849 Interstitial pulmonary disease, unspecified: Secondary | ICD-10-CM

## 2021-08-27 NOTE — Telephone Encounter (Signed)
Called and spoke with patient. He stated that he was following up on the convo he had with Dr. Shearon Stalls earlier this month about the ILD conference. He wanted know if Dr. Shearon Stalls had any new information.   Dr. Shearon Stalls, can you please advise? Thanks!

## 2021-08-28 ENCOUNTER — Telehealth: Payer: Self-pay | Admitting: Internal Medicine

## 2021-08-28 DIAGNOSIS — M0579 Rheumatoid arthritis with rheumatoid factor of multiple sites without organ or systems involvement: Secondary | ICD-10-CM | POA: Diagnosis not present

## 2021-08-28 DIAGNOSIS — Z79899 Other long term (current) drug therapy: Secondary | ICD-10-CM | POA: Diagnosis not present

## 2021-08-28 NOTE — Telephone Encounter (Signed)
Reviewed ILD discussion. I have spoken with DR. Hawkes his rheumatologist. Plan is for echocardiogram to assess for PAH. Will add rituxumab therapy. Dr. Trudie Reed office will set this up.   I will talk with him about indications for transplant referral at next appointment.  He needs follow up with me with PFTs before in April.

## 2021-08-29 NOTE — Telephone Encounter (Addendum)
Patient is scheduled for OV and PFT on 10/29/2021. Nothing further needed at this time.  Next Appt With Pulmonology (LBPU-PFT RM) 10/29/2021 at 3:00 PM & OV at 4 pm

## 2021-08-29 NOTE — Telephone Encounter (Signed)
Called and spoke with patient who states that he is going to be starting infusions and would like to know when he should stop taking Esbriet?  He is scheduled for PFT and OV with Dr. Shearon Stalls on 10/29/21.

## 2021-08-29 NOTE — Telephone Encounter (Signed)
Stay on esbriet for now until we follow up.

## 2021-08-30 NOTE — Telephone Encounter (Signed)
Lm for patient.

## 2021-08-30 NOTE — Telephone Encounter (Signed)
Pt made aware to stay on esbriet until follow up in April . Nothing further needed.

## 2021-08-31 ENCOUNTER — Ambulatory Visit (HOSPITAL_BASED_OUTPATIENT_CLINIC_OR_DEPARTMENT_OTHER)
Admission: RE | Admit: 2021-08-31 | Discharge: 2021-08-31 | Disposition: A | Discharge status: Home / Self Care | Payer: Medicare Other | Source: Ambulatory Visit | Attending: Internal Medicine | Admitting: Internal Medicine

## 2021-08-31 ENCOUNTER — Other Ambulatory Visit: Payer: Self-pay

## 2021-08-31 DIAGNOSIS — I272 Pulmonary hypertension, unspecified: Secondary | ICD-10-CM | POA: Diagnosis not present

## 2021-08-31 DIAGNOSIS — J849 Interstitial pulmonary disease, unspecified: Secondary | ICD-10-CM | POA: Insufficient documentation

## 2021-08-31 DIAGNOSIS — R0602 Shortness of breath: Secondary | ICD-10-CM | POA: Diagnosis not present

## 2021-08-31 LAB — ECHOCARDIOGRAM COMPLETE
AR max vel: 1.79 cm2
AV Area VTI: 1.85 cm2
AV Area mean vel: 1.82 cm2
AV Mean grad: 3 mmHg
AV Peak grad: 5.2 mmHg
Ao pk vel: 1.14 m/s
Area-P 1/2: 3.45 cm2
S' Lateral: 2.6 cm

## 2021-08-31 NOTE — Progress Notes (Incomplete)
Echocardiogram 2D Echocardiogram has been performed.  Merrie Roof F 08/31/2021, 1:46 PM

## 2021-09-05 ENCOUNTER — Other Ambulatory Visit: Payer: Self-pay | Admitting: Emergency Medicine

## 2021-09-05 MED ORDER — MELOXICAM 7.5 MG PO TABS: 7.5000 mg | ORAL_TABLET | Freq: Every day | ORAL | 3 refills | Status: DC | PRN

## 2021-09-05 NOTE — Telephone Encounter (Signed)
Patient is requesting a refill of the following medications: ?Requested Prescriptions  ? ?Pending Prescriptions Disp Refills  ? meloxicam (MOBIC) 7.5 MG tablet 30 tablet 3  ?  Sig: Take 1 tablet (7.5 mg total) by mouth daily as needed for pain.  ? ? ?Date of patient request: 09/05/21  ?Last office visit: 06/06/21  ?Date of last refill: 05/14/19 ?Last refill amount: 30 ?Follow up time period per chart: 12/04/21 ? ?

## 2021-09-05 NOTE — Telephone Encounter (Signed)
1.Medication Requested: meloxicam (MOBIC) 7.5 MG tablet ? ?2. Pharmacy (Name, Street, [place]): [place], [place]  ?Phone:  [phone] ?Fax:  [phone] ? ? ?3. On Med List: yes ? ?4. Last Visit with PCP: 11.30.22 ? ?5. Next visit date with PCP: 05.30.23 ? ? ?Agent: Please be advised that RX refills may take up to 3 business days. We ask that you follow-up with your pharmacy.  ?

## 2021-09-11 ENCOUNTER — Telehealth: Payer: Self-pay

## 2021-09-11 NOTE — Telephone Encounter (Signed)
Received notification from [facility] regarding a prior authorization for [name]. Authorization has been APPROVED from 07/08/2021 to 09/11/2022. Approval letter sent to scan center. ? ?Authorization # [id number] ?

## 2021-09-11 NOTE — Telephone Encounter (Signed)
PA renewal initiated automatically by CoverMyMeds. ? ?Submitted a Prior Authorization request to [facility] for [name] via CoverMyMeds. Will update once we receive a response. ? ? ?Key: [id number] ?

## 2021-09-18 ENCOUNTER — Other Ambulatory Visit: Payer: Self-pay | Admitting: Family Medicine

## 2021-09-18 ENCOUNTER — Encounter: Payer: Self-pay | Admitting: Internal Medicine

## 2021-09-18 DIAGNOSIS — E7800 Pure hypercholesterolemia, unspecified: Secondary | ICD-10-CM

## 2021-09-19 NOTE — Telephone Encounter (Signed)
Dr. Shearon Stalls, please see mychart messages sent by pt and advise. ?

## 2021-09-24 ENCOUNTER — Other Ambulatory Visit: Payer: Self-pay | Admitting: Internal Medicine

## 2021-09-24 DIAGNOSIS — J849 Interstitial pulmonary disease, unspecified: Secondary | ICD-10-CM

## 2021-09-24 NOTE — Telephone Encounter (Signed)
Rx for pirfenidone 849m three times daily sent to [facility] ? ?LFTs on 08/28/21 per [facility] were w[facility] ? ?Per chart review, patient will be starting Rituxan infusions on 09/25/21. He may be stopping pirfenidone pending discussion at [facility]. DShearon Stallson 10/29/21. Will send refill today to ensure there is no interruption in therapy until OV ? ?DKnox Saliva PharmD, MPH, BCPS ?Clinical Pharmacist (Rheumatology and Pulmonology) ?

## 2021-09-25 ENCOUNTER — Encounter: Payer: Self-pay | Admitting: Emergency Medicine

## 2021-09-25 DIAGNOSIS — M0579 Rheumatoid arthritis with rheumatoid factor of multiple sites without organ or systems involvement: Secondary | ICD-10-CM | POA: Diagnosis not present

## 2021-09-28 NOTE — Telephone Encounter (Signed)
Unfortunately I do not know Gonzalo very well.  I saw him only once last November.  He has a history of rheumatoid arthritis and sees his rheumatologist on a regular basis.  These questions should be addressed to the doctors that know him best and are prescribing multiple medications with many different side effects and interactions.  Losing weight should not affect your ability to swallow but esophageal problems will make it difficult to swallow leading to weight loss.  He may need evaluation by a gastrointestinal doctor and possible upper endoscopy for this problem.  Again I think he should reach out to his rheumatologist. ?Thanks.

## 2021-10-09 DIAGNOSIS — M0579 Rheumatoid arthritis with rheumatoid factor of multiple sites without organ or systems involvement: Secondary | ICD-10-CM | POA: Diagnosis not present

## 2021-10-29 ENCOUNTER — Ambulatory Visit (INDEPENDENT_AMBULATORY_CARE_PROVIDER_SITE_OTHER): Payer: Medicare Other | Admitting: Internal Medicine

## 2021-10-29 ENCOUNTER — Encounter: Payer: Self-pay | Admitting: Internal Medicine

## 2021-10-29 VITALS — BP 116/68 | HR 79 | Ht 65.0 in | Wt 114.0 lb

## 2021-10-29 DIAGNOSIS — M0579 Rheumatoid arthritis with rheumatoid factor of multiple sites without organ or systems involvement: Secondary | ICD-10-CM | POA: Diagnosis not present

## 2021-10-29 DIAGNOSIS — J84112 Idiopathic pulmonary fibrosis: Secondary | ICD-10-CM

## 2021-10-29 DIAGNOSIS — J310 Chronic rhinitis: Secondary | ICD-10-CM | POA: Diagnosis not present

## 2021-10-29 LAB — PULMONARY FUNCTION TEST
DL/VA % pred: 55 %
DL/VA: 2.31 ml/min/mmHg/L
DLCO cor % pred: 39 %
DLCO cor: 8.8 ml/min/mmHg
DLCO unc % pred: 39 %
DLCO unc: 8.8 ml/min/mmHg
FEF 25-75 Pre: 4.6 L/sec
FEF2575-%Pred-Pre: 217 %
FEV1-%Pred-Pre: 50 %
FEV1-Pre: 1.21 L
FEV1FVC-%Pred-Pre: 128 %
FEV6-%Pred-Pre: 40 %
FEV6-Pre: 1.22 L
FEV6FVC-%Pred-Pre: 105 %
FVC-%Pred-Pre: 38 %
FVC-Pre: 1.22 L
Pre FEV1/FVC ratio: 99 %
Pre FEV6/FVC Ratio: 100 %

## 2021-10-29 NOTE — Patient Instructions (Addendum)
Please schedule follow up scheduled with myself in 3 months.  If my schedule is not open yet, we will contact you with a reminder closer to that time. Please call [phone] if you haven't heard from [place] a month before.  ? ?I will refer you to allergy for your rhinitis.  ?I will refer you to [facility] for your lung disease.  They will call you to schedule. ? ?We walked you around today and your oxygen levels did not drop so you do not require oxygen.   ?

## 2021-10-29 NOTE — Progress Notes (Signed)
Spirometry/DLCO performed today. ?

## 2021-10-29 NOTE — Progress Notes (Signed)
? ?      ?Earl Thomas    [id number]    [birth year] ? ?Primary Care Physician:Sagardia, Ines Bloomer, MD ?Date of Appointment: 10/29/2021 ?Established Patient Visit ? ?Chief complaint:   ?Chief Complaint  ?Patient presents with  ? Follow-up  ?  F/U after PFT. States his SOB has increased since last visit, especially with activity. Slight increase in wheezing. Denies any changes in his coughing habits.   ? ? ?HPI: ?Earl Thomas is a 71 y.o. gentleman with history of RA-ILD. Intolerant of Ofev due to diarrhea. Had progression on  esbriet. CT scan showed progression. Discussed with Dr. Trudie Reed and we added rituximab  ? ?Interval Updates: ?Here today for follow up for ILD. Methotrexate dosing reduced to 6 mg weekly due to transaminitis.  Esbriet continued.  Still on simponi. He is not doing well on esbriet now and having worsening diarrhea. He has had two infusions of rituxumab since earlier this year.  ? ?Weights reviewed in flowsheets. Has lost 20 lbs unintentionally in the last year. Having early satiety. Denies dysphagia.  ? ?No fevers chills night sweats. We did get his wife on the phone during the encounter. She's concerned about his ? ?Having persistent rhinitis which is not responding to anything. We have tried anti-histamines, anti-lkt inhibitors, ipratropium and intranasal steroid sprays. He's currently of everything. Has paroxysmal coughing fits which worsen with rhinitis. Has had post tussive emesis.  ? ?I have reviewed the patient's family social and past medical history and updated as appropriate.  ? ?Past Medical History:  ?Diagnosis Date  ? Arthritis   ? Glucose intolerance (impaired glucose tolerance)   ? Hyperlipidemia   ? 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 s/p NS consultation/[facility].  ? ? ?Past Surgical History:  ?Procedure Laterality Date  ? ROTATOR CUFF REPAIR Right 01/02/16  ? ? ?Family History  ?Problem Relation Age of Onset  ? Cancer Father 14  ?     prostate cancer  ?  Diabetes Father   ? Heart disease Father 39  ?     AMI late 34s  ? Cancer Mother 91  ?     Breast cancer  ? Heart disease Mother   ?     CABG at age 40  ? Diabetes Brother   ? Heart disease Brother   ?     AMI x 2; CABG  ? Hyperlipidemia Brother   ? ? ?Social History  ? ?Occupational History  ? Occupation: Professor  ?Tobacco Use  ? Smoking status: Former  ?  Types: Pipe  ?  Quit date: 10/06/1988  ?  Years since quitting: 33.0  ? Smokeless tobacco: Never  ? Tobacco comments:  ?  parent smoked in home as a child.  Smoked a pip in college, not daily.  ?Vaping Use  ? Vaping Use: Never used  ?Substance and Sexual Activity  ? Alcohol use: No  ? Drug use: No  ? Sexual activity: Yes  ? ? ?Physical Exam: ?Blood pressure 116/68, pulse 79, height _0  (1.651 m), weight 114 lb (51.7 kg), SpO2 97 %. ? ?Gen:     Thin, well appearing, no distress ?Lungs:    bibasilar crackles, no increased wob ?CV:     RRR no mrg ?MSK:     bilateral MCP and PCP joints are swollen, tender ?Ext: digital clubbing, no le edema.  ? ?Data Reviewed: ?Imaging: ?I have personally reviewed the CT scan from Nov 2020 which demonstrates bibasilar honeycombing consistent with  UIP pattern. On the 2023 feb scan there is progression of disease.  ? ?Echocardiogram 1. Left ventricular ejection fraction, by estimation, is 60 to 65%. The  ?left ventricle has normal function. The left ventricle has no regional  ?wall motion abnormalities. Left ventricular diastolic parameters are  ?consistent with Grade I diastolic  ?dysfunction (impaired relaxation).  ? 2. Right ventricular systolic function is normal. The right ventricular  ?size is normal. There is normal pulmonary artery systolic pressure.  ? 3. The mitral valve is normal in structure. Mild mitral valve  ?regurgitation. No evidence of mitral stenosis.  ? 4. The aortic valve is normal in structure. Aortic valve regurgitation is  ?not visualized. No aortic stenosis is present.  ? 5. The inferior vena cava is normal  in size with greater than 50%  ?respiratory variability, suggesting right atrial pressure of 3 mmHg. ? ? ?PFTs: ? ? ? ?  Latest Ref Rng & Units 10/29/2021  ?  3:00 PM 04/18/2021  ?  3:05 PM 11/13/2020  ?  3:07 PM 08/07/2020  ? 10:04 AM 07/26/2019  ? 10:56 AM  ?PFT Results  ?FVC-Pre L 1.22  P 1.71   1.71   1.37   1.52    ?FVC-Predicted Pre % 38  P 54   54   43   47    ?FVC-Post L     1.82    ?FVC-Predicted Post %     57    ?Pre FEV1/FVC % % 99  P 94   92   100   100    ?Post FEV1/FCV % %     88    ?FEV1-Pre L 1.21  P 1.61   1.57   1.37   1.52    ?FEV1-Predicted Pre % 50  P 67   65   57   63    ?FEV1-Post L     1.61    ?DLCO uncorrected ml/min/mmHg 8.80  P 10.08   11.16   9.68   11.79    ?DLCO UNC% % 39  P 45   49   42   52    ?DLCO corrected ml/min/mmHg 8.80  P  11.16   9.68     ?DLCO COR %Predicted % 39  P  49   42     ?DLVA Predicted % 55  P 71   104   73   82    ?TLC L     3.42    ?TLC % Predicted %     57    ?RV % Predicted %     84    ?  ?P Preliminary result  ? ?I have personally reviewed the patient's PFTs and which show moderate restriction with an FVC of 54% which is stable from previous PFTs.  ?  ?Labs: I have reviewed his outside hospital labs noted on March 23 2019 progress note, his CCP level was greater than 255, Rheumatoid factor was 149 his, ESR was 109 his uric acid was normal. ?LFTs reviewed 03/29/21 shows normal liver function.  ? ?Immunization status: ?Immunization History  ?Administered Date(s) Administered  ? Fluad Quad(high Dose 65+) 04/13/2020, 04/18/2021  ? Influenza, High Dose Seasonal PF 04/24/2019  ? Influenza,inj,Quad PF,6+ Mos 05/30/2015, 04/23/2016, 05/14/2017, 03/25/2018  ? Influenza-Unspecified 04/07/2014  ? PFIZER(Purple Top)SARS-COV-2 Vaccination 08/09/2019, 08/30/2019, 03/07/2020  ? Pneumococcal Conjugate-13 05/18/2018  ? Pneumococcal Polysaccharide-23 06/09/2019  ? Tdap 02/13/2009, 05/14/2017  ? Zoster Recombinat (Shingrix) 05/27/2018  ? Zoster, Live  04/12/2014  ? ? ?Assessment:   ?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 ? ?Plan/Recommendations: ?UIP - Progression of disease - continue rituxumab, methotrexate, simponi. Stop esbriet given diarrhea symptoms becoming intolerable.  ?Weight loss - discussed ensure, boost in addition to meals.  ?Recommend smaller more frequent meals. Denies reflux symptoms.  ? ?We discussed referral to allergy for ongoing rhinitis.  I will place.  ?I also think very reasonable to have a second opinion given progression of ILD. We discussed lung transplantation as a possible destination.he is not yet on oxygen - I did ambulate him again today. His echocardiogram does not show any evidence of pulmonary hypertension.  ? ?Return to Care: ?Return in about 3 months (around 01/28/2022). ? ? ?Lenice Llamas, MD ?Pulmonary and Critical Care Medicine ?[place] ?Office:[phone] ? ? ? ? ? ?

## 2021-10-29 NOTE — Patient Instructions (Signed)
Spirometry/DLCO performed today. ?

## 2021-11-10 ENCOUNTER — Other Ambulatory Visit: Payer: Self-pay

## 2021-11-10 ENCOUNTER — Emergency Department (HOSPITAL_BASED_OUTPATIENT_CLINIC_OR_DEPARTMENT_OTHER): Payer: Medicare Other

## 2021-11-10 ENCOUNTER — Emergency Department (HOSPITAL_BASED_OUTPATIENT_CLINIC_OR_DEPARTMENT_OTHER)
Admission: EM | Admit: 2021-11-10 | Discharge: 2021-11-10 | Disposition: A | Discharge status: Home / Self Care | Payer: Medicare Other | Attending: Emergency Medicine | Admitting: Emergency Medicine

## 2021-11-10 ENCOUNTER — Encounter (HOSPITAL_BASED_OUTPATIENT_CLINIC_OR_DEPARTMENT_OTHER): Payer: Self-pay | Admitting: Emergency Medicine

## 2021-11-10 DIAGNOSIS — R0602 Shortness of breath: Secondary | ICD-10-CM | POA: Diagnosis not present

## 2021-11-10 DIAGNOSIS — I700 Atherosclerosis of aorta: Secondary | ICD-10-CM | POA: Diagnosis not present

## 2021-11-10 DIAGNOSIS — J849 Interstitial pulmonary disease, unspecified: Secondary | ICD-10-CM | POA: Diagnosis not present

## 2021-11-10 DIAGNOSIS — I2510 Atherosclerotic heart disease of native coronary artery without angina pectoris: Secondary | ICD-10-CM | POA: Diagnosis not present

## 2021-11-10 DIAGNOSIS — R079 Chest pain, unspecified: Secondary | ICD-10-CM | POA: Diagnosis not present

## 2021-11-10 DIAGNOSIS — R0789 Other chest pain: Secondary | ICD-10-CM | POA: Diagnosis not present

## 2021-11-10 DIAGNOSIS — Z7982 Long term (current) use of aspirin: Secondary | ICD-10-CM | POA: Insufficient documentation

## 2021-11-10 DIAGNOSIS — R200 Anesthesia of skin: Secondary | ICD-10-CM | POA: Diagnosis not present

## 2021-11-10 LAB — BASIC METABOLIC PANEL
Anion gap: 6 (ref 5–15)
BUN: 24 mg/dL — ABNORMAL HIGH (ref 8–23)
CO2: 31 mmol/L (ref 22–32)
Calcium: 9.1 mg/dL (ref 8.9–10.3)
Chloride: 100 mmol/L (ref 98–111)
Creatinine, Ser: 1.06 mg/dL (ref 0.61–1.24)
GFR, Estimated: >60 mL/min (ref >60)
Glucose, Bld: 121 mg/dL — ABNORMAL HIGH (ref 70–99)
Potassium: 3.4 mmol/L — ABNORMAL LOW (ref 3.5–5.1)
Sodium: 137 mmol/L (ref 135–145)

## 2021-11-10 LAB — TROPONIN I (HIGH SENSITIVITY)
Troponin I (High Sensitivity): 2 ng/L (ref <18)
Troponin I (High Sensitivity): 2 ng/L (ref <18)

## 2021-11-10 LAB — D-DIMER, QUANTITATIVE: D-Dimer, Quant: 4.02 ug/mL-FEU — ABNORMAL HIGH (ref 0.00–0.50)

## 2021-11-10 LAB — CBC
HCT: 43.1 % (ref 39.0–52.0)
Hemoglobin: 13.9 g/dL (ref 13.0–17.0)
MCH: 28.6 pg (ref 26.0–34.0)
MCHC: 32.3 g/dL (ref 30.0–36.0)
MCV: 88.7 fL (ref 80.0–100.0)
Platelets: 299 10*3/uL (ref 150–400)
RBC: 4.86 MIL/uL (ref 4.22–5.81)
RDW: 13.8 % (ref 11.5–15.5)
WBC: 9.7 10*3/uL (ref 4.0–10.5)
nRBC: 0 % (ref 0.0–0.2)

## 2021-11-10 MED ORDER — IOHEXOL 350 MG/ML SOLN
100.0000 mL | Freq: Once | INTRAVENOUS | Status: AC | PRN
  Administered 2021-11-10: 100 mL via INTRAVENOUS

## 2021-11-10 MED ORDER — ASPIRIN 81 MG PO CHEW
324.0000 mg | CHEWABLE_TABLET | Freq: Once | ORAL | Status: AC
  Administered 2021-11-10: 324 mg via ORAL
  Filled 2021-11-10: qty 4

## 2021-11-10 NOTE — ED Provider Notes (Signed)
?[facility] ?Provider Note ? ? ?CSN: [id number] ?Arrival date & time: 11/10/21  1641 ? ?  ? ?History ? ?Chief Complaint  ?Patient presents with  ? Chest Pain  ? ? ?Earl Thomas is a 71 y.o. male. ? ?Patient with a history rheumatoid arthritis interstitial lung disease here with left-sided chest pain that onset about 9 AM this morning while he was at a graduation.  He states the pain was in the left side of his chest and felt like a pressure.  It lasted for about 3 hours before resolving on its own is not had any pain since 12 PM today.  He has never had this kind of pain before.  He states his breathing is no worse than usual.  His cough is no worse than usual.  No fever.  No nausea or vomiting.  No diaphoresis.  No abdominal pain.  When he was having the chest pain he felt some "numbness" to his left leg from the knee down with this is resolved as well.  Denies any numbness to his arms or face.  No focal weakness. ?After coming home from the graduation he checked his blood pressure was in the 80s.  He denies feeling any dizziness or lightheadedness ?Denies any cardiac history.  Has never had this kind of pain in the past.  No history of previous stress test ? ? ?Chest Pain ?Associated symptoms: numbness and shortness of breath   ?Associated symptoms: no abdominal pain, no dizziness, no fatigue, no fever, no headache, no nausea, no palpitations, no vomiting and no weakness   ? ?  ? ?Home Medications ?Prior to Admission medications   ?Medication Sig Start Date End Date Taking? Authorizing Provider  ?albuterol (VENTOLIN HFA) 108 (90 Base) MCG/ACT inhaler Inhale 2 puffs into the lungs every 6 (six) hours as needed for wheezing or shortness of breath. 07/19/21   Spero Geralds, MD  ?aspirin EC 81 MG tablet Take 81 mg by mouth daily.    [provider]  ?atorvastatin (LIPITOR) 20 MG tablet TAKE 1 TABLET DAILY AT 6PM 09/20/21   Wendie Agreste, MD  ?budesonide-formoterol  [facility]) 80-4.5 MCG/ACT inhaler Inhale 2 puffs into the lungs 2 (two) times daily. 06/06/21   Horald Pollen, MD  ?diclofenac sodium (VOLTAREN) 1 % GEL Apply 2 g topically 4 (four) times daily. 09/18/18   Forrest Moron, MD  ?fluticasone (FLONASE) 50 MCG/ACT nasal spray Place 1 spray into both nostrils daily. 08/22/20   Spero Geralds, MD  ?folic acid ([name]) 1 MG tablet Take 1 mg by mouth daily. 08/27/19   [provider]  ?Golimumab ([place] ARIA IV) Inject into the vein. Every 8 weeks infusion    [provider]  ?hydrocortisone (PROCTOSOL HC) 2.5 % rectal cream APPLY ONE APPLICATORFUL RECTALLY TWICE DAILY as needed for hemorrhoid pain/itching/bleeding 11/12/17   Wardell Honour, MD  ?ipratropium (ATROVENT) 0.06 % nasal spray Place 1 spray into both nostrils 3 (three) times daily. 11/13/20   Spero Geralds, MD  ?loperamide (IMODIUM) 2 MG capsule Take 2 mg by mouth as needed for diarrhea or loose stools.    [provider]  ?meloxicam (MOBIC) 7.5 MG tablet Take 1 tablet (7.5 mg total) by mouth daily as needed for pain. 09/05/21   Horald Pollen, MD  ?methotrexate (RHEUMATREX) 10 MG tablet Take 10 mg by mouth once a week. Caution: Chemotherapy. Protect from light.    [provider]  ?montelukast (  SINGULAIR) 10 MG tablet Take 1 tablet (10 mg total) by mouth daily. 04/27/20   Spero Geralds, MD  ?[name] 267 MG TABS TAKE 3 TABLETS BY MOUTH 3 TIMES A DAY 09/24/21   Spero Geralds, MD  ?tadalafil (ADCIRCA/CIALIS) 20 MG tablet Take 0.5 tablets (10 mg total) by mouth every other day as needed for erectile dysfunction. 07/16/18   Forrest Moron, MD  ?   ? ?Allergies    ?Patient has no known allergies.   ? ?Review of Systems   ?Review of Systems  ?Constitutional:  Negative for activity change, appetite change, fatigue and fever.  ?HENT:  Negative for congestion and rhinorrhea.   ?Respiratory:  Positive for chest tightness and shortness of breath.   ?Cardiovascular:   Positive for chest pain. Negative for palpitations.  ?Gastrointestinal:  Negative for abdominal pain, nausea and vomiting.  ?Genitourinary:  Negative for dysuria and hematuria.  ?Musculoskeletal:  Negative for arthralgias and myalgias.  ?Skin:  Negative for rash.  ?Neurological:  Positive for numbness. Negative for dizziness, weakness and headaches.  ? all other systems are negative except as noted in the HPI and PMH.  ? ?Physical Exam ?Updated Vital Signs ?BP 128/80   Pulse 100   Temp 98.2 ?F (36.8 ?C)   Resp (!) 34   Ht _0  (1.651 m)   Wt 56.2 kg   SpO2 94%   BMI 20.63 kg/m?  ?Physical Exam ?Vitals and nursing note reviewed.  ?Constitutional:   ?   General: He is not in acute distress. ?   Appearance: He is well-developed.  ?HENT:  ?   Head: Normocephalic and atraumatic.  ?   Mouth/Throat:  ?   Pharynx: No oropharyngeal exudate.  ?Eyes:  ?   Conjunctiva/sclera: Conjunctivae normal.  ?   Pupils: Pupils are equal, round, and reactive to light.  ?Neck:  ?   Comments: No meningismus. ?Cardiovascular:  ?   Rate and Rhythm: Normal rate and regular rhythm.  ?   Heart sounds: Normal heart sounds. No murmur heard. ?Pulmonary:  ?   Effort: Pulmonary effort is normal. No respiratory distress.  ?   Breath sounds: Normal breath sounds.  ?   Comments: Dyspnea with conversation ?Chest:  ?   Chest wall: No tenderness.  ?Abdominal:  ?   Palpations: Abdomen is soft.  ?   Tenderness: There is no abdominal tenderness. There is no guarding or rebound.  ?Musculoskeletal:     ?   General: No tenderness. Normal range of motion.  ?   Cervical back: Normal range of motion and neck supple.  ?   Right lower leg: No edema.  ?   Left lower leg: No edema.  ?   Comments: Intact DP and PT pulses  ?Skin: ?   General: Skin is warm.  ?Neurological:  ?   General: No focal deficit present.  ?   Mental Status: He is alert and oriented to person, place, and time. Mental status is at baseline.  ?   Cranial Nerves: No cranial nerve deficit.  ?    Motor: No abnormal muscle tone.  ?   Coordination: Coordination normal.  ?   Comments: No ataxia on finger to nose bilaterally. No pronator drift. 5/5 strength throughout. CN 2-12 intact.Equal grip strength. Sensation intact.   ?Psychiatric:     ?   Behavior: Behavior normal.  ? ? ?ED Results / Procedures / Treatments   ?Labs ?(all labs ordered are listed, but only abnormal  results are displayed) ?Labs Reviewed  ?BASIC METABOLIC PANEL - Abnormal; Notable for the following components:  ?    Result Value  ? Potassium 3.4 (*)   ? Glucose, Bld 121 (*)   ? BUN 24 (*)   ? All other components within normal limits  ?D-DIMER, QUANTITATIVE - Abnormal; Notable for the following components:  ? D-Dimer, Quant 4.02 (*)   ? All other components within normal limits  ?CBC  ?TROPONIN I (HIGH SENSITIVITY)  ?TROPONIN I (HIGH SENSITIVITY)  ? ? ?EKG ?EKG Interpretation ? ?Date/Time:  Saturday Nov 10 2021 16:51:46 EDT ?Ventricular Rate:  93 ?PR Interval:  162 ?QRS Duration: 76 ?QT Interval:  316 ?QTC Calculation: 392 ?R Axis:   56 ?Text Interpretation: Normal sinus rhythm Normal ECG No previous ECGs available No previous ECGs available Confirmed by Ezequiel Essex [phone]) on 11/10/2021 4:56:58 PM ? ?Radiology ?DG Chest 2 View ? ?Result Date: 11/10/2021 ?CLINICAL DATA:  Chest pain EXAM: CHEST - 2 VIEW COMPARISON:  Chest x-ray dated May 14, 2019; chest CT dated August 09, 2021 FINDINGS: Cardiac mediastinal contours are unchanged. Course reticular opacities which are most prominent in the lower and peripheral lungs, compatible with interstitial lung disease, findings have progressed when compared with prior radiograph. No focal consolidation. No large pleural effusion or pneumothorax. IMPRESSION: 1. No acute cardiopulmonary disease. 2. Fibrotic interstitial lung disease which has progressed when compared with prior radiograph, correlate with most recent CT. Electronically Signed   By: Yetta Glassman M.D.   On: 11/10/2021 17:13  ? ?CT  Angio Chest PE W and/or Wo Contrast ? ?Result Date: 11/10/2021 ?CLINICAL DATA:  Concern for pulmonary embolism. EXAM: CT ANGIOGRAPHY CHEST WITH CONTRAST TECHNIQUE: Multidetector CT imaging of the chest was performed

## 2021-11-10 NOTE — ED Notes (Signed)
Pt NAD in bed, a/ox4, c/o sudden onset non radiating 7/10 left chest aching that started at 0900 while sitting down. Pt states it went away on its own at 1230 and has not been back since. Pt denies ever having SOB, n/v, dizziness or diaphoresis. LS clear bilat, -edema noted.  ?

## 2021-11-10 NOTE — ED Notes (Signed)
Pt ambulated on RA. SpO2 between 92-95%. HR 90-100. Pt tolerated well.  ?

## 2021-11-10 NOTE — ED Triage Notes (Signed)
Pt arrives pov, steady gait c/o non radiating CP this morning, and hypotension. Also reports left leg pain this am. Denies pain at this time ?

## 2021-11-10 NOTE — ED Notes (Signed)
Pt NAD, a/ox4. Pt verbalizes understanding of all DC and f/u instructions. All questions answered. Pt walks with steady gait to lobby at DC.  ? ?

## 2021-11-10 NOTE — Discharge Instructions (Addendum)
There is no evidence of heart attack today or blood clot in the lung.  You are low risk for heart disease but not 0 risk.  You elected to go home today and follow-up with the cardiologist.  You will be called to help arrange a stress test early next week.  Return to the ED with worsening chest pain especially with exertion, shortness of breath, nausea, vomiting, sweating, any concerns. ?

## 2021-11-19 ENCOUNTER — Ambulatory Visit (INDEPENDENT_AMBULATORY_CARE_PROVIDER_SITE_OTHER): Payer: Medicare Other

## 2021-11-19 VITALS — BP 118/60 | HR 104 | Temp 97.0°F | Ht 65.0 in | Wt 107.6 lb

## 2021-11-19 DIAGNOSIS — Z0000 Encounter for general adult medical examination without abnormal findings: Secondary | ICD-10-CM | POA: Diagnosis not present

## 2021-11-19 NOTE — Patient Instructions (Addendum)
Earl Thomas , ?Thank you for taking time to come for your Medicare Wellness Visit. I appreciate your ongoing commitment to your health goals. Please review the following plan we discussed and let me know if I can assist you in the future.  ? ?Screening recommendations/referrals: ?Colonoscopy: 03/09/2015; due every 10 years ?Recommended yearly ophthalmology/optometry visit for glaucoma screening and checkup ?Recommended yearly dental visit for hygiene and checkup ? ?Vaccinations: ?Influenza vaccine: 04/18/2021 ?Pneumococcal vaccine: 05/18/2018 (Prevnar 13), 06/09/2019 (Pneumococcal 23), 12/25/2020 (Prevnar 20) ?Tdap vaccine: 03/10/2021; due every 10 years ?Shingles vaccine: 05/27/2018, 01/19/2019 ?Covid-19: 07/30/2019, 08/20/2019, 04/27/2020, 04/24/2021 ? ?Advanced directives: No; in the process of completing ? ?Conditions/risks identified: Yes ? ?Next appointment: Please schedule your next Medicare Wellness Visit with your Nurse Health Advisor in 1 year by calling [phone]. ? ?Preventive Care 69 Years and Older, Male ?Preventive care refers to lifestyle choices and visits with your health care provider that can promote health and wellness. ?What does preventive care include? ?A yearly physical exam. This is also called an annual well check. ?Dental exams once or twice a year. ?Routine eye exams. Ask your health care provider how often you should have your eyes checked. ?Personal lifestyle choices, including: ?Daily care of your teeth and gums. ?Regular physical activity. ?Eating a healthy diet. ?Avoiding tobacco and drug use. ?Limiting alcohol use. ?Practicing safe sex. ?Taking low doses of aspirin every day. ?Taking vitamin and mineral supplements as recommended by your health care provider. ?What happens during an annual well check? ?The services and screenings done by your health care provider during your annual well check will depend on your age, overall health, lifestyle risk factors, and family history of  disease. ?Counseling  ?Your health care provider may ask you questions about your: ?Alcohol use. ?Tobacco use. ?Drug use. ?Emotional well-being. ?Home and relationship well-being. ?Sexual activity. ?Eating habits. ?History of falls. ?Memory and ability to understand (cognition). ?Work and work Statistician. ?Screening  ?You may have the following tests or measurements: ?Height, weight, and BMI. ?Blood pressure. ?Lipid and cholesterol levels. These may be checked every 5 years, or more frequently if you are over 50 years old. ?Skin check. ?Lung cancer screening. You may have this screening every year starting at age 57 if you have a 30-pack-year history of smoking and currently smoke or have quit within the past 15 years. ?Fecal occult blood test (FOBT) of the stool. You may have this test every year starting at age 18. ?Flexible sigmoidoscopy or colonoscopy. You may have a sigmoidoscopy every 5 years or a colonoscopy every 10 years starting at age 38. ?Prostate cancer screening. Recommendations will vary depending on your family history and other risks. ?Hepatitis C blood test. ?Hepatitis B blood test. ?Sexually transmitted disease (STD) testing. ?Diabetes screening. This is done by checking your blood sugar (glucose) after you have not eaten for a while (fasting). You may have this done every 1-3 years. ?Abdominal aortic aneurysm (AAA) screening. You may need this if you are a current or former smoker. ?Osteoporosis. You may be screened starting at age 76 if you are at high risk. ?Talk with your health care provider about your test results, treatment options, and if necessary, the need for more tests. ?Vaccines  ?Your health care provider may recommend certain vaccines, such as: ?Influenza vaccine. This is recommended every year. ?Tetanus, diphtheria, and acellular pertussis (Tdap, Td) vaccine. You may need a Td booster every 10 years. ?Zoster vaccine. You may need this after age 29. ?Pneumococcal 13-valent  conjugate (PCV13)  vaccine. One dose is recommended after age 55. ?Pneumococcal polysaccharide (PPSV23) vaccine. One dose is recommended after age 24. ?Talk to your health care provider about which screenings and vaccines you need and how often you need them. ?This information is not intended to replace advice given to you by your health care provider. Make sure you discuss any questions you have with your health care provider. ?Document Released: 07/21/2015 Document Revised: 03/13/2016 Document Reviewed: 04/25/2015 ?Elsevier Interactive Patient Education ? 2017 [place]. ? ?Fall Prevention in the Home ?Falls can cause injuries. They can happen to people of all ages. There are many things you can do to make your home safe and to help prevent falls. ?What can I do on the outside of my home? ?Regularly fix the edges of walkways and driveways and fix any cracks. ?Remove anything that might make you trip as you walk through a door, such as a raised step or threshold. ?Trim any bushes or trees on the path to your home. ?Use bright outdoor lighting. ?Clear any walking paths of anything that might make someone trip, such as rocks or tools. ?Regularly check to see if handrails are loose or broken. Make sure that both sides of any steps have handrails. ?Any raised decks and porches should have guardrails on the edges. ?Have any leaves, snow, or ice cleared regularly. ?Use sand or salt on walking paths during winter. ?Clean up any spills in your garage right away. This includes oil or grease spills. ?What can I do in the bathroom? ?Use night lights. ?Install grab bars by the toilet and in the tub and shower. Do not use towel bars as grab bars. ?Use non-skid mats or decals in the tub or shower. ?If you need to sit down in the shower, use a plastic, non-slip stool. ?Keep the floor dry. Clean up any water that spills on the floor as soon as it happens. ?Remove soap buildup in the tub or shower regularly. ?Attach bath mats  securely with double-sided non-slip rug tape. ?Do not have throw rugs and other things on the floor that can make you trip. ?What can I do in the bedroom? ?Use night lights. ?Make sure that you have a light by your bed that is easy to reach. ?Do not use any sheets or blankets that are too big for your bed. They should not hang down onto the floor. ?Have a firm chair that has side arms. You can use this for support while you get dressed. ?Do not have throw rugs and other things on the floor that can make you trip. ?What can I do in the kitchen? ?Clean up any spills right away. ?Avoid walking on wet floors. ?Keep items that you use a lot in easy-to-reach places. ?If you need to reach something above you, use a strong step stool that has a grab bar. ?Keep electrical cords out of the way. ?Do not use floor polish or wax that makes floors slippery. If you must use wax, use non-skid floor wax. ?Do not have throw rugs and other things on the floor that can make you trip. ?What can I do with my stairs? ?Do not leave any items on the stairs. ?Make sure that there are handrails on both sides of the stairs and use them. Fix handrails that are broken or loose. Make sure that handrails are as long as the stairways. ?Check any carpeting to make sure that it is firmly attached to the stairs. Fix any carpet that is loose or  worn. ?Avoid having throw rugs at the top or bottom of the stairs. If you do have throw rugs, attach them to the floor with carpet tape. ?Make sure that you have a light switch at the top of the stairs and the bottom of the stairs. If you do not have them, ask someone to add them for you. ?What else can I do to help prevent falls? ?Wear shoes that: ?Do not have high heels. ?Have rubber bottoms. ?Are comfortable and fit you well. ?Are closed at the toe. Do not wear sandals. ?If you use a stepladder: ?Make sure that it is fully opened. Do not climb a closed stepladder. ?Make sure that both sides of the stepladder  are locked into place. ?Ask someone to hold it for you, if possible. ?Clearly mark and make sure that you can see: ?Any grab bars or handrails. ?First and last steps. ?Where the edge of each step is. ?Use tools that help y

## 2021-11-19 NOTE — Progress Notes (Signed)
? ?Subjective:  ? Earl Thomas is a 71 y.o. male who presents for Medicare Annual/Subsequent preventive examination. ? ?Review of Systems    ?No ROS. Medicare Wellness Visit. ?Additional risk factors are reflected in social history. ? ?Sleep Patterns: No sleep issues, feels rested on waking and sleeps 6-7 hours nightly. ?Home Safety/Smoke Alarms: Feels safe in home; uses home alarm. Smoke alarms in place. ?Living environment: 2-story home; Lives with spouse; no needs for DME; good support system. ?Seat Belt Safety/Bike Helmet: Wears seat belt. ? ?Cardiac Risk Factors include: advanced age (>22mn, >>53women);dyslipidemia;family history of premature cardiovascular disease ? ?   ?Objective:  ?  ?Today's Vitals  ? 11/19/21 1339  ?BP: 118/60  ?Pulse: (!) 104  ?Temp: (!) 97 ?F (36.1 ?C)  ?SpO2: 95%  ?Weight: 107 lb 9.6 oz (48.8 kg)  ?Height: _0  (1.651 m)  ?PainSc: 0-No pain  ? ?Body mass index is 17.91 kg/m?. ? ? ?  11/19/2021  ?  1:56 PM 11/10/2021  ?  4:54 PM 05/18/2018  ?  8:59 AM 02/15/2016  ? 11:51 AM 01/11/2016  ?  2:28 PM  ?Advanced Directives  ?Does Patient Have a Medical Advance Directive? _1   ?Would patient like information on creating a medical advance directive?    No - patient declined information Yes -Higher education careers advisergiven  ? ? ?Current Medications (verified) ?Outpatient Encounter Medications as of 11/19/2021  ?Medication Sig  ? albuterol (VENTOLIN HFA) 108 (90 Base) MCG/ACT inhaler Inhale 2 puffs into the lungs every 6 (six) hours as needed for wheezing or shortness of breath.  ? aspirin EC 81 MG tablet Take 81 mg by mouth daily.  ? atorvastatin (LIPITOR) 20 MG tablet TAKE 1 TABLET DAILY AT 6PM  ? budesonide-formoterol (SYMBICORT) 80-4.5 MCG/ACT inhaler Inhale 2 puffs into the lungs 2 (two) times daily.  ? diclofenac sodium (VOLTAREN) 1 % GEL Apply 2 g topically 4 (four) times daily.  ? fluticasone (FLONASE) 50 MCG/ACT nasal spray Place 1 spray into both nostrils daily.  ? folic  acid ([name]) 1 MG tablet Take 1 mg by mouth daily.  ? ipratropium (ATROVENT) 0.06 % nasal spray Place 1 spray into both nostrils 3 (three) times daily.  ? loperamide (IMODIUM) 2 MG capsule Take 2 mg by mouth as needed for diarrhea or loose stools.  ? meloxicam (MOBIC) 7.5 MG tablet Take 1 tablet (7.5 mg total) by mouth daily as needed for pain.  ? methotrexate (RHEUMATREX) 10 MG tablet Take 10 mg by mouth once a week. Caution: Chemotherapy. Protect from light.  ? montelukast (SINGULAIR) 10 MG tablet Take 1 tablet (10 mg total) by mouth daily.  ? [name] 267 MG TABS TAKE 3 TABLETS BY MOUTH 3 TIMES A DAY  ? riTUXimab (RITUXAN IV) Inject into the vein.  ? tadalafil (ADCIRCA/CIALIS) 20 MG tablet Take 0.5 tablets (10 mg total) by mouth every other day as needed for erectile dysfunction.  ? [DISCONTINUED] hydrocortisone (PROCTOSOL HC) 2.5 % rectal cream APPLY ONE APPLICATORFUL RECTALLY TWICE DAILY as needed for hemorrhoid pain/itching/bleeding  ? Golimumab ([place] IV) Inject into the vein. Every 8 weeks infusion (Patient not taking: Reported on 11/19/2021)  ? ?No facility-administered encounter medications on file as of 11/19/2021.  ? ? ?Allergies (verified) ?Patient has no known allergies.  ? ?History: ?Past Medical History:  ?Diagnosis Date  ? Arthritis   ? Glucose intolerance (impaired glucose tolerance)   ? Hyperlipidemia   ? 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  s/p NS consultation/[facility].  ? ?Past Surgical History:  ?Procedure Laterality Date  ? ROTATOR CUFF REPAIR Right 01/02/16  ? ?Family History  ?Problem Relation Age of Onset  ? Cancer Father 63  ?     prostate cancer  ? Diabetes Father   ? Heart disease Father 7  ?     AMI late 47s  ? Cancer Mother 45  ?     Breast cancer  ? Heart disease Mother   ?     CABG at age 87  ? Diabetes Brother   ? Heart disease Brother   ?     AMI x 2; CABG  ? Hyperlipidemia Brother   ? ?Social History  ? ?Socioeconomic History  ? Marital status: Married  ?  Spouse name:  Not on file  ? Number of children: 2  ? Years of education: Not on file  ? Highest education level: Not on file  ?Occupational History  ? Occupation: Professor  ?Tobacco Use  ? Smoking status: Former  ?  Types: Pipe  ?  Quit date: 10/06/1988  ?  Years since quitting: 33.1  ? Smokeless tobacco: Never  ? Tobacco comments:  ?  parent smoked in home as a child.  Smoked a pip in college, not daily.  ?Vaping Use  ? Vaping Use: Never used  ?Substance and Sexual Activity  ? Alcohol use: No  ? Drug use: No  ? Sexual activity: Yes  ?Other Topics Concern  ? Not on file  ?Social History Narrative  ? Marital status:  Married x 26 years; second marriage  ?    Children: 1 son (74); 1 adopted daughter (82); 2 grandchildren  ?    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  ?    Tobacco: pipe in [place]  ?    Alcohol: none  ?    Exercise: joined [employer]; going 2-3 times per week.    ?    Seatbelt:  100%  ? ?Social Determinants of Health  ? ?Financial Resource Strain: Low Risk   ? Difficulty of Paying Living Expenses: Not hard at all  ?Food Insecurity: No Food Insecurity  ? Worried About Charity fundraiser in the Last Year: Never true  ? Ran Out of Food in the Last Year: Never true  ?Transportation Needs: No Transportation Needs  ? Lack of Transportation (Medical): No  ? Lack of Transportation (Non-Medical): No  ?Physical Activity: Not on file  ?Stress: No Stress Concern Present  ? Feeling of Stress : Not at all  ?Social Connections: Socially Integrated  ? Frequency of Communication with Friends and Family: More than three times a week  ? Frequency of Social Gatherings with Friends and Family: More than three times a week  ? Attends Religious Services: More than 4 times per year  ? Active Member of Clubs or Organizations: Yes  ? Attends Archivist Meetings: More than 4 times per year  ? Marital Status:  Married  ? ? ?Tobacco Counseling ?Counseling given: Not Answered ?Tobacco comments: parent smoked in home as a child.  Smoked a pip in college, not daily. ? ? ?Clinical Intake: ? ?Pre-visit preparation completed: Yes ? ?Pain : No/denies pain ?Pain Score: 0-No pain ? ?  ? ?Nutritional Risks: None ?Diabetes: No ? ?How often do you need to have someone help you when you read instructions, pamphlets, or other  written materials from your doctor or pharmacy?: 1 - Never ?What is the last grade level you completed in school?: Doctorate Degree; College Professor at [employer] ? ?Diabetic? no ? ?Interpreter Needed?: No ? ?Information entered by :: Lisette Abu, LPN ? ? ?Activities of Daily Living ? ?  11/19/2021  ?  2:15 PM  ?In your present state of health, do you have any difficulty performing the following activities:  ?Hearing? 0  ?Vision? 0  ?Difficulty concentrating or making decisions? 0  ?Walking or climbing stairs? 0  ?Dressing or bathing? 0  ?Doing errands, shopping? 0  ?Preparing Food and eating ? N  ?Using the Toilet? N  ?In the past six months, have you accidently leaked urine? N  ?Do you have problems with loss of bowel control? N  ?Managing your Medications? N  ?Managing your Finances? N  ?Housekeeping or managing your Housekeeping? N  ? ? ?Patient Care Team: ?Horald Pollen, MD as PCP - General (Internal Medicine) ?Carol Ada, MD as Consulting Physician (Gastroenterology) ?Prudencio Pair as Physician Assistant (Emergency Medicine) ? ?Indicate any recent Medical Services you may have received from other than [facility] providers in the past year (date may be approximate). ? ?   ?Assessment:  ? This is a routine wellness examination for Braylan. ? ?Hearing/Vision screen ?Hearing Screening - Comments:: Patient denied any hearing difficulty.   ?No hearing aids. ? ?Vision Screening - Comments:: Patient does wear corrective lenses/contacts.  ?Eye exam done by: [facility]) ? ? ?Dietary issues and exercise activities discussed: ?Current Exercise Habits: The patient does not participate in regular exercise at present, Exercise limited by: respiratory conditions(s) ? ? Goals Addre

## 2021-11-27 ENCOUNTER — Other Ambulatory Visit: Payer: Self-pay | Admitting: Emergency Medicine

## 2021-12-04 ENCOUNTER — Ambulatory Visit (INDEPENDENT_AMBULATORY_CARE_PROVIDER_SITE_OTHER): Payer: Medicare Other | Admitting: Emergency Medicine

## 2021-12-04 ENCOUNTER — Encounter: Payer: Self-pay | Admitting: Emergency Medicine

## 2021-12-04 ENCOUNTER — Other Ambulatory Visit: Payer: Self-pay | Admitting: Emergency Medicine

## 2021-12-04 VITALS — BP 108/68 | HR 104 | Temp 98.6°F | Ht 65.0 in | Wt 107.1 lb

## 2021-12-04 DIAGNOSIS — G9332 Myalgic encephalomyelitis/chronic fatigue syndrome: Secondary | ICD-10-CM

## 2021-12-04 DIAGNOSIS — M26629 Arthralgia of temporomandibular joint, unspecified side: Secondary | ICD-10-CM | POA: Insufficient documentation

## 2021-12-04 DIAGNOSIS — L989 Disorder of the skin and subcutaneous tissue, unspecified: Secondary | ICD-10-CM

## 2021-12-04 DIAGNOSIS — J84114 Acute interstitial pneumonitis: Secondary | ICD-10-CM | POA: Insufficient documentation

## 2021-12-04 DIAGNOSIS — E785 Hyperlipidemia, unspecified: Secondary | ICD-10-CM

## 2021-12-04 DIAGNOSIS — Z1211 Encounter for screening for malignant neoplasm of colon: Secondary | ICD-10-CM | POA: Diagnosis not present

## 2021-12-04 DIAGNOSIS — R634 Abnormal weight loss: Secondary | ICD-10-CM | POA: Diagnosis not present

## 2021-12-04 DIAGNOSIS — J849 Interstitial pulmonary disease, unspecified: Secondary | ICD-10-CM

## 2021-12-04 DIAGNOSIS — I700 Atherosclerosis of aorta: Secondary | ICD-10-CM

## 2021-12-04 DIAGNOSIS — M0510 Rheumatoid lung disease with rheumatoid arthritis of unspecified site: Secondary | ICD-10-CM | POA: Diagnosis not present

## 2021-12-04 DIAGNOSIS — Z79899 Other long term (current) drug therapy: Secondary | ICD-10-CM

## 2021-12-04 DIAGNOSIS — R351 Nocturia: Secondary | ICD-10-CM

## 2021-12-04 DIAGNOSIS — Z8042 Family history of malignant neoplasm of prostate: Secondary | ICD-10-CM

## 2021-12-04 DIAGNOSIS — D649 Anemia, unspecified: Secondary | ICD-10-CM

## 2021-12-04 DIAGNOSIS — J8409 Other alveolar and parieto-alveolar conditions: Secondary | ICD-10-CM

## 2021-12-04 HISTORY — DX: Other alveolar and parieto-alveolar conditions: J84.09

## 2021-12-04 HISTORY — DX: Family history of malignant neoplasm of prostate: Z80.42

## 2021-12-04 HISTORY — DX: Abnormal weight loss: R63.4

## 2021-12-04 HISTORY — DX: Acute interstitial pneumonitis: J84.114

## 2021-12-04 HISTORY — DX: Arthralgia of temporomandibular joint, unspecified side: M26.629

## 2021-12-04 HISTORY — DX: Atherosclerosis of aorta: I70.0

## 2021-12-04 HISTORY — DX: Myalgic encephalomyelitis/chronic fatigue syndrome: G93.32

## 2021-12-04 HISTORY — DX: Other long term (current) drug therapy: Z79.899

## 2021-12-04 HISTORY — DX: Disorder of the skin and subcutaneous tissue, unspecified: L98.9

## 2021-12-04 LAB — COMPREHENSIVE METABOLIC PANEL
ALT: 11 U/L (ref 0–53)
AST: 19 U/L (ref 0–37)
Albumin: 3.8 g/dL (ref 3.5–5.2)
Alkaline Phosphatase: 54 U/L (ref 39–117)
BUN: 13 mg/dL (ref 6–23)
CO2: 29 mEq/L (ref 19–32)
Calcium: 9.1 mg/dL (ref 8.4–10.5)
Chloride: 103 mEq/L (ref 96–112)
Creatinine, Ser: 1.06 mg/dL (ref 0.40–1.50)
GFR: 72.04 mL/min (ref >60.00)
Glucose, Bld: 94 mg/dL (ref 70–99)
Potassium: 3.8 mEq/L (ref 3.5–5.1)
Sodium: 140 mEq/L (ref 135–145)
Total Bilirubin: 0.6 mg/dL (ref 0.2–1.2)
Total Protein: 6.9 g/dL (ref 6.0–8.3)

## 2021-12-04 LAB — CBC WITH DIFFERENTIAL/PLATELET
Basophils Absolute: 0 10*3/uL (ref 0.0–0.1)
Basophils Relative: 0.8 % (ref 0.0–3.0)
Eosinophils Absolute: 0.1 10*3/uL (ref 0.0–0.7)
Eosinophils Relative: 2.4 % (ref 0.0–5.0)
HCT: 38.2 % — ABNORMAL LOW (ref 39.0–52.0)
Hemoglobin: 12.5 g/dL — ABNORMAL LOW (ref 13.0–17.0)
Lymphocytes Relative: 17.4 % (ref 12.0–46.0)
Lymphs Abs: 0.9 10*3/uL (ref 0.7–4.0)
MCHC: 32.7 g/dL (ref 30.0–36.0)
MCV: 87.6 fl (ref 78.0–100.0)
Monocytes Absolute: 0.5 10*3/uL (ref 0.1–1.0)
Monocytes Relative: 10.2 % (ref 3.0–12.0)
Neutro Abs: 3.7 10*3/uL (ref 1.4–7.7)
Neutrophils Relative %: 69.2 % (ref 43.0–77.0)
Platelets: 250 10*3/uL (ref 150.0–400.0)
RBC: 4.36 Mil/uL (ref 4.22–5.81)
RDW: 13.9 % (ref 11.5–15.5)
WBC: 5.3 10*3/uL (ref 4.0–10.5)

## 2021-12-04 LAB — PSA: PSA: 0.58 ng/mL (ref 0.10–4.00)

## 2021-12-04 LAB — LIPID PANEL
Cholesterol: 119 mg/dL (ref 0–200)
HDL: 34.4 mg/dL — ABNORMAL LOW (ref >39.00)
LDL Cholesterol: 72 mg/dL (ref 0–99)
NonHDL: 84.82
Total CHOL/HDL Ratio: 3
Triglycerides: 64 mg/dL (ref 0.0–149.0)
VLDL: 12.8 mg/dL (ref 0.0–40.0)

## 2021-12-04 LAB — SEDIMENTATION RATE: Sed Rate: 128 mm/hr — ABNORMAL HIGH (ref 0–20)

## 2021-12-04 NOTE — Progress Notes (Signed)
Earl Thomas 71 y.o.   Chief Complaint  Patient presents with   Follow-up   Weight Loss    Concern about wright loss     HISTORY OF PRESENT ILLNESS: This is a 71 y.o. male complaining of weight loss over the last several months. Has history of rheumatoid arthritis and severe interstitial lung disease. Sees pulmonary on a regular basis and is also scheduled for second opinion at [facility] next month. Family history of prostate cancer. Last colonoscopy in 2016 showed adenomatous polyps. Has hyperpigmented flat lesion on chest.  Needs dermatology referral. Wt Readings from Last 3 Encounters:  12/04/21 107 lb 2 oz (48.6 kg)  11/19/21 107 lb 9.6 oz (48.8 kg)  11/10/21 124 lb (56.2 kg)     HPI   Prior to Admission medications   Medication Sig Start Date End Date Taking? Authorizing Provider  albuterol (VENTOLIN HFA) 108 (90 Base) MCG/ACT inhaler Inhale 2 puffs into the lungs every 6 (six) hours as needed for wheezing or shortness of breath. 07/19/21  Yes Spero Geralds, MD  aspirin EC 81 MG tablet Take 81 mg by mouth daily.   Yes [provider]  atorvastatin (LIPITOR) 20 MG tablet TAKE 1 TABLET DAILY AT 6PM 09/20/21  Yes Wendie Agreste, MD  diclofenac sodium (VOLTAREN) 1 % GEL Apply 2 g topically 4 (four) times daily. 09/18/18  Yes Stallings, Zoe A, MD  fluticasone (FLONASE) 50 MCG/ACT nasal spray Place 1 spray into both nostrils daily. 08/22/20  Yes Spero Geralds, MD  folic acid ([name]) 1 MG tablet Take 1 mg by mouth daily. 08/27/19  Yes [provider]  ipratropium (ATROVENT) 0.06 % nasal spray Place 1 spray into both nostrils 3 (three) times daily. 11/13/20  Yes Spero Geralds, MD  loperamide (IMODIUM) 2 MG capsule Take 2 mg by mouth as needed for diarrhea or loose stools.   Yes [provider]  meloxicam (MOBIC) 7.5 MG tablet Take 1 tablet (7.5 mg total) by mouth daily as needed for pain. 09/05/21  Yes Qasim Diveley, Ines Bloomer, MD   methotrexate (RHEUMATREX) 10 MG tablet Take 10 mg by mouth once a week. Caution: Chemotherapy. Protect from light.   Yes [provider]  montelukast (SINGULAIR) 10 MG tablet Take 1 tablet (10 mg total) by mouth daily. 04/27/20  Yes Spero Geralds, MD  [name] 267 MG TABS TAKE 3 TABLETS BY MOUTH 3 TIMES A DAY 09/24/21  Yes Spero Geralds, MD  riTUXimab (RITUXAN IV) Inject into the vein.   Yes [provider]  SYMBICORT 80-4.5 MCG/ACT inhaler Inhale 2 puffs by mouth twice daily 11/28/21  Yes Iliany Losier, Ines Bloomer, MD  tadalafil (ADCIRCA/CIALIS) 20 MG tablet Take 0.5 tablets (10 mg total) by mouth every other day as needed for erectile dysfunction. 07/16/18  Yes Forrest Moron, MD  Golimumab ([place] ARIA IV) Inject into the vein. 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    Past Surgical History:  Procedure Laterality Date   ROTATOR CUFF REPAIR Right 01/02/16    Social History   Socioeconomic History   Marital status: Married    Spouse name: Not on  file   Number of children: 2   Years of education: Not on file   Highest education level: Not on file  Occupational History   Occupation: Professor  Tobacco Use   Smoking status: Former    Types: Pipe    Quit date: 10/06/1988    Years since quitting: 33.1   Smokeless tobacco: Never   Tobacco comments:    parent smoked in home as a child.  Smoked a pip in college, not daily.  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      Tobacco: pipe in 1980s      Alcohol: none      Exercise: joined [employer]; going 2-3 times per week.        Seatbelt:  100%   Social Determinants of Radio broadcast assistant Strain: Low Risk    Difficulty of Paying Living Expenses: Not hard at all  Food Insecurity: No Food Insecurity   Worried About Charity fundraiser in the Last Year: Never true   Ran Out of Food in the Last Year: Never true  Transportation Needs: No Transportation Needs   Lack of Transportation (Medical): No   Lack of Transportation (Non-Medical): No  Physical Activity: Not on file  Stress: No Stress Concern Present   Feeling of Stress : Not at all  Social Connections: Socially Integrated   Frequency of Communication with Friends and Family: More than three times a week   Frequency of Social Gatherings with Friends and Family: More than three times a week   Attends Religious Services: More than 4 times per year   Active Member of [employer] or Organizations: Yes   Attends Music therapist: More than 4 times per year   Marital Status: Married   Human resources officer Violence: Not At Risk   Fear of Current or Ex-Partner: No   Emotionally Abused: No   Physically Abused: No   Sexually Abused: No    Family History  Problem Relation Age of Onset   Cancer Father 61       prostate cancer   Diabetes Father    Heart disease Father 49       AMI late 56s   Cancer Mother 61       Breast cancer   Heart disease Mother        CABG at age 74   Diabetes Brother    Heart disease Brother  AMI x 2; CABG   Hyperlipidemia Brother      Review of Systems  Constitutional:  Positive for weight loss. Negative for chills and fever.  HENT: Negative.  Negative for congestion and sore throat.   Respiratory: Negative.  Negative for cough and shortness of breath.   Cardiovascular: Negative.  Negative for chest pain and palpitations.  Gastrointestinal:  Negative for abdominal pain, blood in stool, nausea and vomiting.  Genitourinary:  Positive for frequency. Negative for dysuria and hematuria.       Nocturia  Musculoskeletal: Negative.   Skin: Negative.  Negative for rash.  Neurological:  Negative for dizziness and headaches.  All other systems reviewed and are negative. Vitals:   12/04/21 1040  BP: 108/68  Pulse: (!) 104  Temp: 98.6 F (37 C)  SpO2: 94%     Physical Exam Vitals reviewed.  Constitutional:      Appearance: Normal appearance.  HENT:     Head: Normocephalic.     Mouth/Throat:     Mouth: Mucous membranes are moist.     Pharynx: Oropharynx is clear.  Eyes:     Extraocular Movements: Extraocular movements intact.     Conjunctiva/sclera: Conjunctivae normal.     Pupils: Pupils are equal, round, and reactive to light.  Cardiovascular:     Rate and Rhythm: Normal rate and regular rhythm.     Pulses: Normal pulses.     Heart sounds: Normal heart sounds.  Pulmonary:     Effort: Pulmonary effort is normal.     Breath sounds: Normal breath sounds.  Abdominal:     Palpations: Abdomen is soft.     Tenderness: There  is no abdominal tenderness.  Musculoskeletal:     Cervical back: No tenderness.  Lymphadenopathy:     Cervical: No cervical adenopathy.  Skin:    General: Skin is warm and dry.     Capillary Refill: Capillary refill takes less than 2 seconds.     Findings: Lesion present.     Comments: Flat hyperpigmented multiple lesions to left upper chest area  Neurological:     General: No focal deficit present.     Mental Status: He is alert and oriented to person, place, and time.  Psychiatric:        Mood and Affect: Mood normal.        Behavior: Behavior normal.     ASSESSMENT & PLAN: A total of 51 minutes was spent with the patient and counseling/coordination of care regarding preparing for this visit, review of most recent office visit note, review of most recent pulmonary and rheumatology office visit notes, review of most recent CT scan of chest report, review of most recent blood work results, differential diagnosis of weight loss and need for work-up, education on nutrition, review of all medications, prognosis, documentation and need for follow-up.  Problem List Items Addressed This Visit       Cardiovascular and Mediastinum   Aortic atherosclerosis ([place])     Respiratory   Interstitial lung disease ([place])    Stable.  Using inhalers.  Scheduled for second opinion at [facility] next month.       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  Needs dermatology evaluation.  Referral placed today.       Relevant Orders   Ambulatory referral to Dermatology     Other   Dyslipidemia  Stable.  Continue atorvastatin 20 mg daily.       Relevant Orders   Lipid panel   Weight loss - Primary    Differential diagnosis including possibility of cancer discussed with patient. Blood work done today.  Recent CBC does not show anemia or any signs of blood disease.  No  lymphadenopathy on physical exam. Sed rate done today looking for vasculitic process. Recent CT scan of chest report reviewed with patient. Family history of prostate cancer.  PSA done today.  Urology referral placed. Colonoscopy done in 2016 showed adenomatous polyps.  Needs repeat colonoscopy. Hyperpigmented flat skin lesion needs to be ruled out for melanoma. No symptoms of tuberculosis or any other infectious process.       Relevant Orders   PSA   Ambulatory referral to Urology   Ambulatory referral to Gastroenterology   CBC with Differential/Platelet   Comprehensive metabolic panel   Lipid panel   Sedimentation rate   Family history of prostate cancer    PSA done today.  Needs urology evaluation.  Referral placed today.       Relevant Orders   PSA   Ambulatory referral to Urology   Other Visit Diagnoses     Colon cancer screening       Relevant Orders   Ambulatory referral to Gastroenterology   Nocturia       Relevant Orders   PSA      Patient Instructions  Health Maintenance After Age 68 After age 7, you are at a higher risk for certain long-term diseases and infections as well as injuries from falls. Falls are a major cause of broken bones and head injuries in people who are older than age 63. Getting regular preventive care can help to keep you healthy and well. Preventive care includes getting regular testing and making lifestyle changes as recommended by your health care provider. Talk with your health care provider about: Which screenings and tests you should have. A screening is a test that checks for a disease when you have no symptoms. A diet and exercise plan that is right for you. What should I know about screenings and tests to prevent falls? Screening and testing are the best ways to find a health problem early. Early diagnosis and treatment give you the best chance of managing medical conditions that are common after age 40. Certain conditions and  lifestyle choices may make you more likely to have a fall. Your health care provider may recommend: Regular vision checks. Poor vision and conditions such as cataracts can make you more likely to have a fall. If you wear glasses, make sure to get your prescription updated if your vision changes. Medicine review. Work with your health care provider to regularly review all of the medicines you are taking, including over-the-counter medicines. Ask your health care provider about any side effects that may make you more likely to have a fall. Tell your health care provider if any medicines that you take make you feel dizzy or sleepy. Strength and balance checks. Your health care provider may recommend certain tests to check your strength and balance while standing, walking, or changing positions. Foot health exam. Foot pain and numbness, as well as not wearing proper footwear, can make you more likely to have a fall. Screenings, including: Osteoporosis screening. Osteoporosis is a condition that causes the bones to get weaker and break more easily. Blood pressure screening. Blood pressure changes and medicines to control blood pressure can make you feel dizzy.  Depression screening. You may be more likely to have a fall if you have a fear of falling, feel depressed, or feel unable to do activities that you used to do. Alcohol use screening. Using too much alcohol can affect your balance and may make you more likely to have a fall. Follow these instructions at home: Lifestyle Do not drink alcohol if: Your health care provider tells you not to drink. If you drink alcohol: Limit how much you have to: 0-1 drink a day for women. 0-2 drinks a day for men. Know how much alcohol is in your drink. In the [place] one drink equals one 12 oz bottle of beer (355 mL), one 5 oz glass of wine (148 mL), or one 1 oz glass of hard liquor (44 mL). Do not use any products that contain nicotine or tobacco. These products  include cigarettes, chewing tobacco, and vaping devices, such as e-cigarettes. If you need help quitting, ask your health care provider. Activity  Follow a regular exercise program to stay fit. This will help you maintain your balance. Ask your health care provider what types of exercise are appropriate for you. If you need a cane or walker, use it as recommended by your health care provider. Wear supportive shoes that have nonskid soles. Safety  Remove any tripping hazards, such as rugs, cords, and clutter. Install safety equipment such as grab bars in bathrooms and safety rails on stairs. Keep rooms and walkways well-lit. General instructions Talk with your health care provider about your risks for falling. Tell your health care provider if: You fall. Be sure to tell your health care provider about all falls, even ones that seem minor. You feel dizzy, tiredness (fatigue), or off-balance. Take over-the-counter and prescription medicines only as told by your health care provider. These include supplements. Eat a healthy diet and maintain a healthy weight. A healthy diet includes low-fat dairy products, low-fat (lean) meats, and fiber from whole grains, beans, and lots of fruits and vegetables. Stay current with your vaccines. Schedule regular health, dental, and eye exams. Summary Having a healthy lifestyle and getting preventive care can help to protect your health and wellness after age 86. Screening and testing are the best way to find a health problem early and help you avoid having a fall. Early diagnosis and treatment give you the best chance for managing medical conditions that are more common for people who are older than age 25. Falls are a major cause of broken bones and head injuries in people who are older than age 3. Take precautions to prevent a fall at home. Work with your health care provider to learn what changes you can make to improve your health and wellness and to prevent  falls. This information is not intended to replace advice given to you by your health care provider. Make sure you discuss any questions you have with your health care provider. Document Revised: 11/13/2020 Document Reviewed: 11/13/2020 Elsevier Patient Education  [place], MD [facility]

## 2021-12-04 NOTE — Assessment & Plan Note (Signed)
Stable.  Continue atorvastatin 20 mg daily.

## 2021-12-04 NOTE — Assessment & Plan Note (Signed)
Stable.  Using inhalers.  Scheduled for second opinion at [facility] next month.

## 2021-12-04 NOTE — Assessment & Plan Note (Signed)
Flat and hyperpigmented.  Needs dermatology evaluation.  Referral placed today.

## 2021-12-04 NOTE — Patient Instructions (Signed)
Health Maintenance After Age 69 After age 25, you are at a higher risk for certain long-term diseases and infections as well as injuries from falls. Falls are a major cause of broken bones and head injuries in people who are older than age 42. Getting regular preventive care can help to keep you healthy and well. Preventive care includes getting regular testing and making lifestyle changes as recommended by your health care provider. Talk with your health care provider about: Which screenings and tests you should have. A screening is a test that checks for a disease when you have no symptoms. A diet and exercise plan that is right for you. What should I know about screenings and tests to prevent falls? Screening and testing are the best ways to find a health problem early. Early diagnosis and treatment give you the best chance of managing medical conditions that are common after age 47. Certain conditions and lifestyle choices may make you more likely to have a fall. Your health care provider may recommend: Regular vision checks. Poor vision and conditions such as cataracts can make you more likely to have a fall. If you wear glasses, make sure to get your prescription updated if your vision changes. Medicine review. Work with your health care provider to regularly review all of the medicines you are taking, including over-the-counter medicines. Ask your health care provider about any side effects that may make you more likely to have a fall. Tell your health care provider if any medicines that you take make you feel dizzy or sleepy. Strength and balance checks. Your health care provider may recommend certain tests to check your strength and balance while standing, walking, or changing positions. Foot health exam. Foot pain and numbness, as well as not wearing proper footwear, can make you more likely to have a fall. Screenings, including: Osteoporosis screening. Osteoporosis is a condition that causes  the bones to get weaker and break more easily. Blood pressure screening. Blood pressure changes and medicines to control blood pressure can make you feel dizzy. Depression screening. You may be more likely to have a fall if you have a fear of falling, feel depressed, or feel unable to do activities that you used to do. Alcohol use screening. Using too much alcohol can affect your balance and may make you more likely to have a fall. Follow these instructions at home: Lifestyle Do not drink alcohol if: Your health care provider tells you not to drink. If you drink alcohol: Limit how much you have to: 0-1 drink a day for women. 0-2 drinks a day for men. Know how much alcohol is in your drink. In the [place] one drink equals one 12 oz bottle of beer (355 mL), one 5 oz glass of wine (148 mL), or one 1 oz glass of hard liquor (44 mL). Do not use any products that contain nicotine or tobacco. These products include cigarettes, chewing tobacco, and vaping devices, such as e-cigarettes. If you need help quitting, ask your health care provider. Activity  Follow a regular exercise program to stay fit. This will help you maintain your balance. Ask your health care provider what types of exercise are appropriate for you. If you need a cane or walker, use it as recommended by your health care provider. Wear supportive shoes that have nonskid soles. Safety  Remove any tripping hazards, such as rugs, cords, and clutter. Install safety equipment such as grab bars in bathrooms and safety rails on stairs. Keep rooms and walkways  well-lit. General instructions Talk with your health care provider about your risks for falling. Tell your health care provider if: You fall. Be sure to tell your health care provider about all falls, even ones that seem minor. You feel dizzy, tiredness (fatigue), or off-balance. Take over-the-counter and prescription medicines only as told by your health care provider. These include  supplements. Eat a healthy diet and maintain a healthy weight. A healthy diet includes low-fat dairy products, low-fat (lean) meats, and fiber from whole grains, beans, and lots of fruits and vegetables. Stay current with your vaccines. Schedule regular health, dental, and eye exams. Summary Having a healthy lifestyle and getting preventive care can help to protect your health and wellness after age 68. Screening and testing are the best way to find a health problem early and help you avoid having a fall. Early diagnosis and treatment give you the best chance for managing medical conditions that are more common for people who are older than age 59. Falls are a major cause of broken bones and head injuries in people who are older than age 73. Take precautions to prevent a fall at home. Work with your health care provider to learn what changes you can make to improve your health and wellness and to prevent falls. This information is not intended to replace advice given to you by your health care provider. Make sure you discuss any questions you have with your health care provider. Document Revised: 11/13/2020 Document Reviewed: 11/13/2020 Elsevier Patient Education  [place].

## 2021-12-04 NOTE — Assessment & Plan Note (Signed)
Differential diagnosis including possibility of cancer discussed with patient. Blood work done today.  Recent CBC does not show anemia or any signs of blood disease.  No lymphadenopathy on physical exam. Sed rate done today looking for vasculitic process. Recent CT scan of chest report reviewed with patient. Family history of prostate cancer.  PSA done today.  Urology referral placed. Colonoscopy done in 2016 showed adenomatous polyps.  Needs repeat colonoscopy. Hyperpigmented flat skin lesion needs to be ruled out for melanoma. No symptoms of tuberculosis or any other infectious process.

## 2021-12-04 NOTE — Assessment & Plan Note (Signed)
PSA done today.  Needs urology evaluation.  Referral placed today.

## 2021-12-07 ENCOUNTER — Telehealth: Payer: Self-pay | Admitting: Hematology and Oncology

## 2021-12-07 NOTE — Telephone Encounter (Signed)
Scheduled appt per 5/30 referral . Pt is aware of appt date and time. Pt is aware to arrive 15 mins prior to appt time and to bring and updated insurance card. Pt is aware of appt location.

## 2021-12-17 ENCOUNTER — Telehealth: Payer: Self-pay

## 2021-12-17 NOTE — Telephone Encounter (Signed)
Samples left at front desk and patient aware.

## 2021-12-17 NOTE — Telephone Encounter (Signed)
Pt is calling to see about getting more Ensure.  Please CB [phone]

## 2021-12-24 DIAGNOSIS — R634 Abnormal weight loss: Secondary | ICD-10-CM | POA: Diagnosis not present

## 2021-12-24 DIAGNOSIS — Z86010 Personal history of colonic polyps: Secondary | ICD-10-CM | POA: Diagnosis not present

## 2021-12-24 DIAGNOSIS — J8417 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: Secondary | ICD-10-CM | POA: Diagnosis not present

## 2021-12-25 ENCOUNTER — Ambulatory Visit (INDEPENDENT_AMBULATORY_CARE_PROVIDER_SITE_OTHER): Payer: Medicare Other | Admitting: Internal Medicine

## 2021-12-25 ENCOUNTER — Encounter: Payer: Self-pay | Admitting: Internal Medicine

## 2021-12-25 VITALS — BP 100/60 | HR 100 | Temp 98.4°F | Resp 20 | Ht 65.5 in | Wt 106.6 lb

## 2021-12-25 DIAGNOSIS — K219 Gastro-esophageal reflux disease without esophagitis: Secondary | ICD-10-CM

## 2021-12-25 DIAGNOSIS — M1990 Unspecified osteoarthritis, unspecified site: Secondary | ICD-10-CM | POA: Insufficient documentation

## 2021-12-25 DIAGNOSIS — J310 Chronic rhinitis: Secondary | ICD-10-CM

## 2021-12-25 DIAGNOSIS — R634 Abnormal weight loss: Secondary | ICD-10-CM

## 2021-12-25 DIAGNOSIS — M0510 Rheumatoid lung disease with rheumatoid arthritis of unspecified site: Secondary | ICD-10-CM

## 2021-12-25 DIAGNOSIS — E785 Hyperlipidemia, unspecified: Secondary | ICD-10-CM | POA: Insufficient documentation

## 2021-12-25 MED ORDER — AZELASTINE HCL 0.1 % NA SOLN: 2.0000 | Freq: Two times a day (BID) | NASAL | 12 refills | Status: DC

## 2021-12-25 MED ORDER — LEVOCETIRIZINE DIHYDROCHLORIDE 5 MG PO TABS: 5.0000 mg | ORAL_TABLET | Freq: Every evening | ORAL | 5 refills | Status: DC

## 2021-12-25 MED ORDER — FAMOTIDINE 20 MG PO TABS: 20.0000 mg | ORAL_TABLET | Freq: Two times a day (BID) | ORAL | 3 refills | Status: DC

## 2021-12-25 NOTE — Progress Notes (Signed)
New Patient Note  RE: Earl Thomas MRN: [id number] DOB: Nov 20, 1950 Date of Office Visit: 12/25/2021  Consult requested by: Spero Geralds, MD Primary care provider: Horald Pollen, MD  Chief Complaint: Allergy Testing  History of Present Illness: I had the pleasure of seeing Keyonta Barradas for initial evaluation at the [facility] on 12/25/2021. He is a 71 y.o. male with a history of RA and RA- ILD, who is referred here by Horald Pollen, MD for the evaluation of chronic rhinitis   He follows with [facility].  Currently on rituximab, methotrexate, Simponi.  Stopped Ofev due to diarrhea.  Complications include 20 pound unintentional weight loss over the past year.  He is currently scheduled for a second opinion at [facility] for discussion of lung transplantation  History obtained from patient.  Chronic rhinitis: started 8-12 months ago  Symptoms include:  coughing with post tussive emesis, nasal congestion, rhinorrhea, and post nasal drainage, rare sneezing; worse in the mornings  Occurs year-round Potential triggers: unknown  Treatments tried: anti-histamines, anti-lkt inhibitors, ipratropium and intranasal steroid sprays; ipatroprium worked the best but caused dryness  Previous allergy testing: no History of reflux/heartburn: no, but symptoms are worse in AM  History of chronic sinusitis or sinus surgery: no Nonallergic triggers:  denies     Recent PFTs on 10/29/2021: FVC 1.22 L, FEV1 1.21 L, 50%; ratio 99%  -Moderate restrictive pattern which was stable per pulmonary note from prior PFTs   Assessment and Plan: Jasten is a 71 y.o. male with: Chronic rhinitis - Plan: Allergy Test, Allergens, Zone 2  Gastroesophageal reflux disease without esophagitis  Rheumatoid lung disease with rheumatoid arthritis ([place])  Weight loss Plan: Patient Instructions  71 year old with progressive interstitial lung disease associated  rheumatoid arthritis with chronic rhinitis.  Attempted skin testing today but cannot get adequate positive control we will confirm with blood work.  I think it is unlikely to be IgE mediated but need testing to fully rule out.  We will step up antihistamines to Xyzal, continue Flonase and add Astelin.  Start empiric treatment for reflux induced respiratory disease.   Chronic  Rhinitis: Not well  controlled  - Testing today showed could not interpret due to inadequate controls, will get blood work to confirm  - Continue with: Flonase (fluticasone) two sprays per nostril daily - Start taking: Xyzal (levocetirizine) 42m tablet once daily and Astelin (azelastine) 2 sprays per nostril 1-2 times daily as needed - You can use an extra dose of the antihistamine, if needed, for breakthrough symptoms.  - Consider nasal saline rinses 1-2 times daily to remove allergens from the nasal cavities as well as help with mucous clearance (this is especially helpful to do before the nasal sprays are given)   GERD  -We will empirically treat as this may be contributing to your nose and sinus symptoms -  Start Famotidine 237mtwice daily  - Start dietary and lifestyle modifications    Continue to follow up with pulmonary for RA- ILD   Follow up: 4 weeks   Thank you so much for letting me partake in your care today.  Don't hesitate to reach out if you have any additional concerns!  EvRoney MarionMD  [facility] [place] [place]  No follow-ups on file.  Meds ordered this encounter  Medications   famotidine (PEPCID) 20 MG tablet    Sig: Take 1 tablet (20 mg total) by mouth 2 (two)  times daily.    Dispense:  90 tablet    Refill:  3   levocetirizine (XYZAL) 5 MG tablet    Sig: Take 1 tablet (5 mg total) by mouth every evening.    Dispense:  30 tablet    Refill:  5   azelastine (ASTELIN) 0.1 % nasal spray    Sig: Place 2 sprays into both nostrils 2 (two) times daily. Use in each nostril as  directed    Dispense:  30 mL    Refill:  12   Lab Orders         Allergens, Zone 2      Other allergy screening: Asthma: no Rhino conjunctivitis: yes Food allergy: no Medication allergy: no Hymenoptera allergy: no Urticaria: no Eczema:no History of recurrent infections suggestive of immunodeficency: no  Diagnostics: Skin Testing: Environmental allergy panel. Could not interpret due to inadequate positive control  Results interpreted by myself and discussed with patient/family.  Airborne Adult Perc - 12/25/21 1009     Time Antigen Placed 1005    Allergen Manufacturer Lavella Hammock    Location Back    Number of Test 59    1. Control-Buffer 50% Glycerol Negative    2. Control-Histamine 1 mg/ml Negative    3. Albumin saline Negative    4. Florien Negative    5. [place] Negative    6. Johnson Negative    7. Hartley Blue Negative    8. Meadow Fescue Negative    9. Perennial Rye Negative    10. Sweet Vernal Negative    11. Timothy Negative    12. Cocklebur Negative    13. Burweed Marshelder Negative    14. Ragweed, short Negative    15. Ragweed, Giant Negative    16. Plantain,  English Negative    17. Lamb's Quarters Negative    18. Sheep Sorrell Negative    19. Rough Pigweed Negative    20. Marsh Elder, Rough Negative    21. Mugwort, Common Negative    22. Ash mix Negative    23. Birch mix Negative    24. Beech American Negative    25. Box, Elder Negative    26. Cedar, red Negative    27. Cottonwood, [place] Negative    28. Elm mix Negative    29. Hickory Negative    30. Maple mix Negative    31. Oak, [place] mix Negative    32. Pecan Pollen Negative    33. Pine mix Negative    34. Sycamore Eastern Negative    35. Day, Black Pollen Negative    36. Alternaria alternata Negative    37. Cladosporium Herbarum Negative    38. Aspergillus mix Negative    39. Penicillium mix Negative    40. Bipolaris sorokiniana (Helminthosporium) Negative    41. Drechslera spicifera  (Curvularia) Negative    42. Mucor plumbeus Negative    43. Fusarium moniliforme Negative    44. Aureobasidium pullulans (pullulara) Negative    45. Rhizopus oryzae Negative    46. Botrytis cinera Negative    47. Epicoccum nigrum Negative    48. Phoma betae Negative    49. Candida Albicans Negative    50. Trichophyton mentagrophytes Negative    51. Mite, D Farinae  5,000 AU/ml Negative    52. Mite, D Pteronyssinus  5,000 AU/ml Negative    53. Cat Hair 10,000 BAU/ml Negative    54.  Dog Epithelia Negative    55. Mixed Feathers Negative    56.  Horse Epithelia Negative    57. Cockroach, German Negative    58. Mouse Negative    59. 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   Past Surgical History: Past Surgical History:  Procedure Laterality Date   ROTATOR CUFF REPAIR Right 01/02/16   Medication List:  Current Outpatient Medications  Medication Sig Dispense Refill   albuterol  (VENTOLIN HFA) 108 (90 Base) MCG/ACT inhaler Inhale 2 puffs into the lungs every 6 (six) hours as needed for wheezing or shortness of breath. 18 g 5   azelastine (ASTELIN) 0.1 % nasal spray Place 2 sprays into both nostrils 2 (two) times daily. Use in each nostril as directed 30 mL 12   cetirizine (ZYRTEC) 10 MG tablet Take 10 mg by mouth daily.     famotidine (PEPCID) 20 MG tablet Take 1 tablet (20 mg total) by mouth 2 (two) times daily. 90 tablet 3   folic acid ([name]) 1 MG tablet Take 1 mg by mouth daily.     levocetirizine (XYZAL) 5 MG tablet Take 1 tablet (5 mg total) by mouth every evening. 30 tablet 5   meloxicam (MOBIC) 7.5 MG tablet Take 1 tablet (7.5 mg total) by mouth daily as needed for pain. 30 tablet 3   methotrexate (RHEUMATREX) 10 MG tablet Take 10 mg by mouth once a week. Caution: Chemotherapy. Protect from light.     riTUXimab (RITUXAN IV) Inject into the vein.     SYMBICORT 80-4.5 MCG/ACT inhaler Inhale 2 puffs by mouth twice daily 11 g 0   aspirin EC 81 MG tablet Take 81 mg by mouth daily. (Patient not taking: Reported on 12/25/2021)     diclofenac sodium (VOLTAREN) 1 % GEL Apply 2 g topically 4 (four) times daily. (Patient not taking: Reported on 12/25/2021) 100 g 1   fluticasone (FLONASE) 50 MCG/ACT nasal spray Place 1 spray into both nostrils daily. (Patient not taking: Reported on 12/25/2021) 16 g 2   ipratropium (ATROVENT) 0.06 % nasal spray Place 1 spray into both nostrils 3 (three) times daily. (Patient not taking: Reported on 12/25/2021) 15 mL 12   loperamide (IMODIUM) 2 MG capsule Take 2 mg by mouth as needed for diarrhea or loose stools. (Patient not taking: Reported on 12/25/2021)     montelukast (SINGULAIR) 10 MG tablet Take 1 tablet (10 mg total) by mouth daily. (Patient not taking: Reported on 12/25/2021) 30 tablet 11   No current facility-administered medications for this visit.   Allergies: No Known Allergies Social History: Social History  Socioeconomic  History   Marital status: Married    Spouse name: Not on file   Number of children: 2   Years of education: Not on file   Highest education level: Not on file  Occupational History   Occupation: Professor  Tobacco Use   Smoking status: Former    Types: Pipe    Quit date: 10/06/1988    Years since quitting: 33.2   Smokeless tobacco: Never   Tobacco comments:    parent smoked in home as a child.  Smoked a pip in college, not daily.  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      Tobacco: pipe in 1980s      Alcohol: none      Exercise: joined [employer]; going 2-3 times per week.        Seatbelt:  100%   Social Determinants of Health   Financial Resource Strain: Low Risk  (11/19/2021)   Overall Financial Resource Strain (CARDIA)    Difficulty of Paying Living Expenses: Not hard at all  Food Insecurity: No Food Insecurity (11/19/2021)   Hunger Vital Sign    Worried About Running Out of Food in the Last Year: Never true    Ran Out of Food in the Last Year: Never true  Transportation Needs: No Transportation Needs (11/19/2021)   PRAPARE - Hydrologist (Medical): No    Lack of Transportation (Non-Medical): No  Physical Activity: Not on file  Stress: No Stress Concern Present (11/19/2021)   [place]    Feeling of Stress : Not at all  Social Connections: [place] (11/19/2021)   Social Connection and Isolation Panel [NHANES]    Frequency of  Communication with Friends and Family: More than three times a week    Frequency of Social Gatherings with Friends and Family: More than three times a week    Attends Religious Services: More than 4 times per year    Active Member of [employer] or Organizations: Yes    Attends Music therapist: More than 4 times per year    Marital Status: Married   Lives in a single-family home that is 69 years old.  There are no roaches in the house and that is 2 feet off the floor.  They do not have dust mite precautions on better pillows.  He is not supposed to fumes, chemicals or dust.  There is no HEPA filter in the home and home is not near an interstate industrial area. Smoking: No exposure Occupation: Actor HistoryFreight forwarder in the house: no Charity fundraiser in the family room: yes Carpet in the bedroom: yes Heating: gas Cooling: central Pet: no  Family History: Family History  Problem Relation Age of Onset   Cancer Father 68       prostate cancer   Diabetes Father    Heart disease Father 83       AMI  late 41s   Cancer Mother 24       Breast cancer   Heart disease Mother        CABG at age 22   Diabetes Brother    Heart disease Brother        AMI x 2; CABG   Hyperlipidemia Brother      ROS: All others negative except as noted per HPI.   Objective: BP 100/60   Pulse 100   Temp 98.4 F (36.9 C) (Temporal)   Resp 20   Ht 5' 5.5" (1.664 m)   Wt 106 lb 9.6 oz (48.4 kg)   SpO2 95%   BMI 17.47 kg/m  Body mass index is 17.47 kg/m.  General Appearance:  Alert, cooperative, no distress, thin appearing  Head:  Normocephalic, without obvious abnormality, atraumatic  Eyes:  Conjunctiva clear, EOM's intact  Nose: Nares normal, normal mucosa, no visible anterior polyps, and septum midline  Throat: Lips, tongue normal; teeth and gums normal, normal posterior oropharynx and no tonsillar exudate  Neck: Supple, symmetrical  Lungs:    Faint crackles in right lower base, no wheezing or rails , Respirations unlabored, intermittent dry coughing  Heart:  regular rate and rhythm and no murmur, Appears well perfused  Extremities: No edema  Skin: Skin color, texture, turgor normal, no rashes or lesions on visualized portions of skin  Neurologic: No gross deficits   The plan was reviewed with the patient/family, and all questions/concerned were addressed.  It was my pleasure to see Semisi today and participate in his care. Please feel free to contact me with any questions or concerns.  Sincerely,  Roney Marion, MD Allergy & Immunology  [facility] office: [phone] [facility] office: [phone]

## 2021-12-25 NOTE — Patient Instructions (Addendum)
71 year old with progressive interstitial lung disease associated rheumatoid arthritis with chronic rhinitis.  Attempted skin testing today but cannot get adequate positive control we will confirm with blood work.  I think it is unlikely to be IgE mediated but need testing to fully rule out.  We will step up antihistamines to Xyzal, continue Flonase and add Astelin.  Start empiric treatment for reflux induced respiratory disease.   Chronic  Rhinitis: Not well  controlled  - Testing today showed could not interpret due to inadequate controls, will get blood work to confirm  - Continue with: Flonase (fluticasone) two sprays per nostril daily - Start taking: Xyzal (levocetirizine) 35m tablet once daily and Astelin (azelastine) 2 sprays per nostril 1-2 times daily as needed - You can use an extra dose of the antihistamine, if needed, for breakthrough symptoms.  - Consider nasal saline rinses 1-2 times daily to remove allergens from the nasal cavities as well as help with mucous clearance (this is especially helpful to do before the nasal sprays are given)   GERD  -We will empirically treat as this may be contributing to your nose and sinus symptoms -  Start Famotidine 233mtwice daily  - Start dietary and lifestyle modifications    Continue to follow up with pulmonary for RA- ILD   Follow up: 4 weeks   Thank you so much for letting me partake in your care today.  Don't hesitate to reach out if you have any additional concerns!  EvRoney MarionMD  [facility] [place]

## 2021-12-26 ENCOUNTER — Other Ambulatory Visit: Payer: Self-pay

## 2021-12-26 ENCOUNTER — Inpatient Hospital Stay: Payer: Medicare Other

## 2021-12-26 ENCOUNTER — Inpatient Hospital Stay: Payer: Medicare Other | Attending: Hematology and Oncology | Admitting: Hematology and Oncology

## 2021-12-26 VITALS — BP 128/70 | HR 103 | Temp 97.9°F | Resp 17 | Wt 106.8 lb

## 2021-12-26 DIAGNOSIS — D509 Iron deficiency anemia, unspecified: Secondary | ICD-10-CM | POA: Insufficient documentation

## 2021-12-26 DIAGNOSIS — J310 Chronic rhinitis: Secondary | ICD-10-CM | POA: Diagnosis not present

## 2021-12-26 DIAGNOSIS — R634 Abnormal weight loss: Secondary | ICD-10-CM | POA: Diagnosis not present

## 2021-12-26 DIAGNOSIS — E785 Hyperlipidemia, unspecified: Secondary | ICD-10-CM | POA: Diagnosis not present

## 2021-12-26 DIAGNOSIS — J849 Interstitial pulmonary disease, unspecified: Secondary | ICD-10-CM | POA: Diagnosis not present

## 2021-12-26 DIAGNOSIS — D500 Iron deficiency anemia secondary to blood loss (chronic): Secondary | ICD-10-CM

## 2021-12-26 DIAGNOSIS — M069 Rheumatoid arthritis, unspecified: Secondary | ICD-10-CM | POA: Diagnosis not present

## 2021-12-26 LAB — CBC WITH DIFFERENTIAL (CANCER CENTER ONLY)
Abs Immature Granulocytes: 0.01 10*3/uL (ref 0.00–0.07)
Basophils Absolute: 0.1 10*3/uL (ref 0.0–0.1)
Basophils Relative: 1 %
Eosinophils Absolute: 0.4 10*3/uL (ref 0.0–0.5)
Eosinophils Relative: 5 %
HCT: 40.2 % (ref 39.0–52.0)
Hemoglobin: 13.1 g/dL (ref 13.0–17.0)
Immature Granulocytes: 0 %
Lymphocytes Relative: 9 %
Lymphs Abs: 0.7 10*3/uL (ref 0.7–4.0)
MCH: 28.2 pg (ref 26.0–34.0)
MCHC: 32.6 g/dL (ref 30.0–36.0)
MCV: 86.5 fL (ref 80.0–100.0)
Monocytes Absolute: 0.5 10*3/uL (ref 0.1–1.0)
Monocytes Relative: 6 %
Neutro Abs: 6.4 10*3/uL (ref 1.7–7.7)
Neutrophils Relative %: 79 %
Platelet Count: 240 10*3/uL (ref 150–400)
RBC: 4.65 MIL/uL (ref 4.22–5.81)
RDW: 13.7 % (ref 11.5–15.5)
WBC Count: 8.1 10*3/uL (ref 4.0–10.5)
nRBC: 0 % (ref 0.0–0.2)

## 2021-12-26 LAB — CMP (CANCER CENTER ONLY)
ALT: 23 U/L (ref 0–44)
AST: 27 U/L (ref 15–41)
Albumin: 3.7 g/dL (ref 3.5–5.0)
Alkaline Phosphatase: 56 U/L (ref 38–126)
Anion gap: 7 (ref 5–15)
BUN: 18 mg/dL (ref 8–23)
CO2: 29 mmol/L (ref 22–32)
Calcium: 9 mg/dL (ref 8.9–10.3)
Chloride: 104 mmol/L (ref 98–111)
Creatinine: 0.98 mg/dL (ref 0.61–1.24)
GFR, Estimated: >60 mL/min (ref >60)
Glucose, Bld: 87 mg/dL (ref 70–99)
Potassium: 4.2 mmol/L (ref 3.5–5.1)
Sodium: 140 mmol/L (ref 135–145)
Total Bilirubin: 0.6 mg/dL (ref 0.3–1.2)
Total Protein: 8.2 g/dL — ABNORMAL HIGH (ref 6.5–8.1)

## 2021-12-26 LAB — SEDIMENTATION RATE: Sed Rate: 76 mm/hr — ABNORMAL HIGH (ref 0–16)

## 2021-12-26 LAB — FERRITIN: Ferritin: 489 ng/mL — ABNORMAL HIGH (ref 24–336)

## 2021-12-26 LAB — C-REACTIVE PROTEIN: CRP: 3.5 mg/dL — ABNORMAL HIGH (ref <1.0)

## 2021-12-26 LAB — RETIC PANEL
Immature Retic Fract: 6.8 % (ref 2.3–15.9)
RBC.: 4.65 MIL/uL (ref 4.22–5.81)
Retic Count, Absolute: 60.9 10*3/uL (ref 19.0–186.0)
Retic Ct Pct: 1.3 % (ref 0.4–3.1)
Reticulocyte Hemoglobin: 30.5 pg (ref >27.9)

## 2021-12-26 LAB — IRON AND IRON BINDING CAPACITY (CC-WL,HP ONLY)
Iron: 27 ug/dL — ABNORMAL LOW (ref 45–182)
Saturation Ratios: 10 % — ABNORMAL LOW (ref 17.9–39.5)
TIBC: 259 ug/dL (ref 250–450)
UIBC: 232 ug/dL

## 2021-12-26 LAB — FOLATE: Folate: >40 ng/mL (ref >5.9)

## 2021-12-26 LAB — VITAMIN B12: Vitamin B-12: 757 pg/mL (ref 180–914)

## 2021-12-26 LAB — LACTATE DEHYDROGENASE: LDH: 254 U/L — ABNORMAL HIGH (ref 98–192)

## 2021-12-26 NOTE — Progress Notes (Unsigned)
[facility] [phone]   Fax:([phone] [place] NOTE  Patient Care Team: Horald Pollen, MD as PCP - General (Internal Medicine) Carol Ada, MD as Consulting Physician (Gastroenterology) Prudencio Pair as Physician Assistant (Emergency Medicine)  Hematological/Oncological History # Iron Deficiency Anemia of Unclear Etiology   CHIEF COMPLAINTS/PURPOSE OF CONSULTATION:  "Iron Deficiency Anemia "  HISTORY OF PRESENTING ILLNESS:  Earl Thomas 71 y.o. male with medical history significant for ILD, HLD, abnormal weight loss, and RA who presents for evaluation of mild anemia.   On review of the previous records ***  On exam today ***  MEDICAL HISTORY:  Past Medical History:  Diagnosis Date   Abnormal weight loss 12/04/2021   Acute interstitial pneumonitis ([place]) 12/04/2021   Aortic atherosclerosis ([place]) 12/04/2021   Arthralgia of temporomandibular joint 12/04/2021   Arthritis    BMI 26.0-26.9,adult 10/22/2016   Chronic fatigue syndrome 12/04/2021   Dyslipidemia 03/10/2012   Family history of prostate cancer 12/04/2021   Glucose intolerance (impaired glucose tolerance)    Hyperlipidemia    Interstitial lung disease ([place]) 07/16/2019   Long-term current use of high risk medication other than anticoagulant 07/16/2019   Other long term (current) drug therapy 12/04/2021   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  Smoked a pip in college, not daily.  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      Tobacco: pipe in 1980s      Alcohol: none      Exercise: joined [employer]; going 2-3 times per week.        Seatbelt:  100%   Social Determinants of Health   Financial Resource Strain: Low Risk  (11/19/2021)   Overall Financial Resource Strain (CARDIA)    Difficulty of Paying Living Expenses: Not hard at all  Food Insecurity: No Food Insecurity (11/19/2021)   Hunger Vital Sign    Worried About Running Out of Food in the Last Year: Never true    Ran Out of Food in the Last Year: Never true  Transportation Needs: No Transportation Needs (11/19/2021)   PRAPARE -  Hydrologist (Medical): No    Lack of Transportation (Non-Medical): No  Physical Activity: Not on file  Stress: No Stress Concern Present (11/19/2021)   [place]    Feeling of Stress : Not at all  Social Connections: [place] (11/19/2021)   Social Connection and Isolation Panel [NHANES]    Frequency of Communication with Friends and Family: More than three times a week    Frequency of Social Gatherings with Friends and Family: More than three times a week     Attends Religious Services: More than 4 times per year    Active Member of [employer] or Organizations: Yes    Attends Music therapist: More than 4 times per year    Marital Status: Married  Human resources officer Violence: Not At Risk (11/19/2021)   Humiliation, Afraid, Rape, and Kick questionnaire    Fear of Current or Ex-Partner: No    Emotionally Abused: No    Physically Abused: No    Sexually Abused: No    FAMILY HISTORY: Family History  Problem Relation Age of Onset   Cancer Father 25       prostate cancer   Diabetes Father    Heart disease Father 26       AMI late 9s   Cancer Mother 73       Breast cancer   Heart disease Mother        CABG at age 68   Diabetes Brother    Heart disease Brother        AMI x 2; CABG   Hyperlipidemia Brother     ALLERGIES:  has No Known Allergies.  MEDICATIONS:  Current Outpatient Medications  Medication Sig Dispense Refill   albuterol (VENTOLIN HFA) 108 (90 Base) MCG/ACT inhaler Inhale 2 puffs into the lungs every 6 (six) hours as needed for wheezing or shortness of breath. 18 g 5   aspirin EC 81 MG tablet Take 81 mg by mouth daily. (Patient not taking: Reported on 12/25/2021)     azelastine (ASTELIN) 0.1 % nasal spray Place 2 sprays into both nostrils 2 (two) times daily. Use in each nostril as directed 30 mL 12   cetirizine (ZYRTEC) 10 MG tablet Take 10 mg by mouth daily.     diclofenac sodium (VOLTAREN) 1 % GEL Apply 2 g topically 4 (four) times daily. (Patient not taking: Reported on 12/25/2021) 100 g 1   famotidine (PEPCID) 20 MG tablet Take 1 tablet (20 mg total) by mouth 2 (two) times daily. 90 tablet 3   fluticasone (FLONASE) 50 MCG/ACT nasal spray Place 1 spray into both nostrils daily. (Patient not taking: Reported on 6/31/4970) 16 g 2   folic acid ([name]) 1 MG tablet Take 1 mg by mouth daily.     ipratropium (ATROVENT) 0.06 % nasal spray Place 1 spray into both nostrils 3 (three) times daily. (Patient not taking:  Reported on 12/25/2021) 15 mL 12   levocetirizine (XYZAL) 5 MG tablet Take 1 tablet (5 mg total) by mouth every evening. 30 tablet 5   loperamide (IMODIUM) 2 MG capsule Take 2 mg by mouth as needed for diarrhea or loose stools. (Patient not taking: Reported on 12/25/2021)     meloxicam (MOBIC) 7.5 MG tablet Take 1 tablet (7.5 mg total) by mouth daily as needed for pain. 30 tablet 3   methotrexate (RHEUMATREX) 10 MG tablet Take 10 mg by mouth once a week. Caution:  Chemotherapy. Protect from light.     montelukast (SINGULAIR) 10 MG tablet Take 1 tablet (10 mg total) by mouth daily. (Patient not taking: Reported on 12/25/2021) 30 tablet 11   riTUXimab (RITUXAN IV) Inject into the vein.     SYMBICORT 80-4.5 MCG/ACT inhaler Inhale 2 puffs by mouth twice daily 11 g 0   No current facility-administered medications for this visit.    REVIEW OF SYSTEMS:   Constitutional: ( - ) fevers, ( - )  chills , ( - ) night sweats Eyes: ( - ) blurriness of vision, ( - ) double vision, ( - ) watery eyes Ears, nose, mouth, throat, and face: ( - ) mucositis, ( - ) sore throat Respiratory: ( - ) cough, ( - ) dyspnea, ( - ) wheezes Cardiovascular: ( - ) palpitation, ( - ) chest discomfort, ( - ) lower extremity swelling Gastrointestinal:  ( - ) nausea, ( - ) heartburn, ( - ) change in bowel habits Skin: ( - ) abnormal skin rashes Lymphatics: ( - ) new lymphadenopathy, ( - ) easy bruising Neurological: ( - ) numbness, ( - ) tingling, ( - ) new weaknesses Behavioral/Psych: ( - ) mood change, ( - ) new changes  All other systems were reviewed with the patient and are negative.  PHYSICAL EXAMINATION:  There were no vitals filed for this visit. There were no vitals filed for this visit.  GENERAL: well appearing *** in NAD  SKIN: skin color, texture, turgor are normal, no rashes or significant lesions EYES: conjunctiva are pink and non-injected, sclera clear LUNGS: clear to auscultation and percussion with normal  breathing effort HEART: regular rate & rhythm and no murmurs and no lower extremity edema Musculoskeletal: no cyanosis of digits and no clubbing  PSYCH: alert & oriented x 3, fluent speech NEURO: no focal motor/sensory deficits  LABORATORY DATA:  I have reviewed the data as listed    Latest Ref Rng & Units 12/04/2021   11:14 AM 11/10/2021    4:56 PM 11/12/2017   10:01 AM  CBC  WBC 4.0 - 10.5 K/uL 5.3  9.7  4.8   Hemoglobin 13.0 - 17.0 g/dL 12.5  13.9  14.8   Hematocrit 39.0 - 52.0 % 38.2  43.1  45.3   Platelets 150.0 - 400.0 K/uL 250.0  299  197        Latest Ref Rng & Units 12/04/2021   11:14 AM 11/10/2021    4:56 PM 03/29/2021   10:21 AM  CMP  Glucose 70 - 99 mg/dL 94  121    BUN 6 - 23 mg/dL 13  24    Creatinine 0.40 - 1.50 mg/dL 1.06  1.06    Sodium 135 - 145 mEq/L 140  137    Potassium 3.5 - 5.1 mEq/L 3.8  3.4    Chloride 96 - 112 mEq/L 103  100    CO2 19 - 32 mEq/L 29  31    Calcium 8.4 - 10.5 mg/dL 9.1  9.1    Total Protein 6.0 - 8.3 g/dL 6.9   7.8   Total Bilirubin 0.2 - 1.2 mg/dL 0.6   0.5   Alkaline Phos 39 - 117 U/L 54   52   AST 0 - 37 U/L 19   29   ALT 0 - 53 U/L 11   45      ASSESSMENT & PLAN ***  After review of the labs, review of the records, and discussion with the  patient the patients findings are most consistent with ***  No orders of the defined types were placed in this encounter.   All questions were answered. The patient knows to call the clinic with any problems, questions or concerns.  A total of more than {CHL ONC TIME VISIT - SUPJS:[id number]} were spent on this encounter with face-to-face time and non-face-to-face time, including preparing to see the patient, ordering tests and/or medications, counseling the patient and coordination of care as outlined above.   Ledell Peoples, MD Department of Hematology/Oncology [place] [facility] Phone: [phone] Pager: [phone] Email:  [email or web address]  12/26/2021 1:10 PM

## 2021-12-27 ENCOUNTER — Ambulatory Visit (INDEPENDENT_AMBULATORY_CARE_PROVIDER_SITE_OTHER): Payer: Medicare Other | Admitting: Cardiology

## 2021-12-27 ENCOUNTER — Encounter: Payer: Self-pay | Admitting: Cardiology

## 2021-12-27 VITALS — BP 96/60 | HR 111 | Ht 65.0 in | Wt 106.0 lb

## 2021-12-27 DIAGNOSIS — R079 Chest pain, unspecified: Secondary | ICD-10-CM | POA: Insufficient documentation

## 2021-12-27 DIAGNOSIS — I700 Atherosclerosis of aorta: Secondary | ICD-10-CM

## 2021-12-27 DIAGNOSIS — R0602 Shortness of breath: Secondary | ICD-10-CM | POA: Diagnosis not present

## 2021-12-27 MED ORDER — NITROGLYCERIN 0.4 MG SL SUBL: 0.4000 mg | SUBLINGUAL_TABLET | SUBLINGUAL | 6 refills | Status: DC | PRN

## 2021-12-27 NOTE — Patient Instructions (Signed)
Medication Instructions:  Your physician has recommended you make the following change in your medication:   Use nitroglycerin 1 tablet placed under the tongue at the first sign of chest pain or an angina attack. 1 tablet may be used every 5 minutes as needed, for up to 15 minutes. Do not take more than 3 tablets in 15 minutes. If pain persist call 911 or go to the nearest ED.   *If you need a refill on your cardiac medications before your next appointment, please call your pharmacy*   Lab Work: None ordered If you have labs (blood work) drawn today and your tests are completely normal, you will receive your results only by: [place] (if you have MyChart) OR A paper copy in the mail If you have any lab test that is abnormal or we need to change your treatment, we will call you to review the results.   Testing/Procedures: Your physician has requested that you have a lexiscan myoview. For further information please visit [email or web address]. Please follow instruction sheet, as given.  The test will take approximately 3 to 4 hours to complete; you may bring reading material.  If someone comes with you to your appointment, they will need to remain in the main lobby due to limited space in the testing area.   How to prepare for your Myocardial Perfusion Test: Do not eat or drink 3 hours prior to your test, except you may have water. Do not consume products containing caffeine (regular or decaffeinated) 12 hours prior to your test. (ex: coffee, chocolate, sodas, tea). Do bring a list of your current medications with you.  If not listed below, you may take your medications as normal. Do wear comfortable clothes (no dresses or overalls) and walking shoes, tennis shoes preferred (No heels or open toe shoes are allowed). Do NOT wear cologne, perfume, aftershave, or lotions (deodorant is allowed). If these instructions are not followed, your test will have to be  rescheduled.    Follow-Up: At [facility], you and your health needs are our priority.  As part of our continuing mission to provide you with exceptional heart care, we have created designated Provider Care Teams.  These Care Teams include your primary Cardiologist (physician) and Advanced Practice Providers (APPs -  Physician Assistants and Nurse Practitioners) who all work together to provide you with the care you need, when you need it.  We recommend signing up for the patient portal called "MyChart".  Sign up information is provided on this After Visit Summary.  MyChart is used to connect with patients for Virtual Visits (Telemedicine).  Patients are able to view lab/test results, encounter notes, upcoming appointments, etc.  Non-urgent messages can be sent to your provider as well.   To learn more about what you can do with MyChart, go to [email or web address].    Your next appointment:   9 month(s)  The format for your next appointment:   In Person  Provider:   Jyl Heinz, MD   Other Instructions Cardiac Nuclear Scan A cardiac nuclear scan is a test that is done to check the flow of blood to your heart. It is done when you are resting and when you are exercising. The test looks for problems such as: Not enough blood reaching a portion of the heart. The heart muscle not working as it should. You may need this test if: You have heart disease. You have had lab results that are not normal. You have had heart surgery or a  balloon procedure to open up blocked arteries (angioplasty). You have chest pain. You have shortness of breath. In this test, a special dye (tracer) is put into your bloodstream. The tracer will travel to your heart. A camera will then take pictures of your heart to see how the tracer moves through your heart. This test is usually done at a hospital and takes 2-4 hours. Tell a doctor about: Any allergies you have. All medicines you are taking, including  vitamins, herbs, eye drops, creams, and over-the-counter medicines. Any problems you or family members have had with anesthetic medicines. Any blood disorders you have. Any surgeries you have had. Any medical conditions you have. Whether you are pregnant or may be pregnant. What are the risks? Generally, this is a safe test. However, problems may occur, such as: Serious chest pain and heart attack. This is only a risk if the stress portion of the test is done. Rapid heartbeat. A feeling of warmth in your chest. This feeling usually does not last long. Allergic reaction to the tracer. What happens before the test? Ask your doctor about changing or stopping your normal medicines. This is important. Follow instructions from your doctor about what you cannot eat or drink. Remove your jewelry on the day of the test. What happens during the test? An IV tube will be inserted into one of your veins. Your doctor will give you a small amount of tracer through the IV tube. You will wait for 20-40 minutes while the tracer moves through your bloodstream. Your heart will be monitored with an electrocardiogram (ECG). You will lie down on an exam table. Pictures of your heart will be taken for about 15-20 minutes. You may also have a stress test. For this test, one of these things may be done: You will be asked to exercise on a treadmill or a stationary bike. You will be given medicines that will make your heart work harder. This is done if you are unable to exercise. When blood flow to your heart has peaked, a tracer will again be given through the IV tube. After 20-40 minutes, you will get back on the exam table. More pictures will be taken of your heart. Depending on the tracer that is used, more pictures may need to be taken 3-4 hours later. Your IV tube will be removed when the test is over. The test may vary among doctors and hospitals. What happens after the test? Ask your doctor: Whether you  can return to your normal schedule, including diet, activities, and medicines. Whether you should drink more fluids. This will help to remove the tracer from your body. Drink enough fluid to keep your pee (urine) pale yellow. Ask your doctor, or the department that is doing the test: When will my results be ready? How will I get my results? Summary A cardiac nuclear scan is a test that is done to check the flow of blood to your heart. Tell your doctor whether you are pregnant or may be pregnant. Before the test, ask your doctor about changing or stopping your normal medicines. This is important. Ask your doctor whether you can return to your normal activities. You may be asked to drink more fluids. This information is not intended to replace advice given to you by your health care provider. Make sure you discuss any questions you have with your health care provider. Document Revised: 10/14/2018 Document Reviewed: 12/08/2017 Elsevier Patient Education  2021 [place].   Nitroglycerin Sublingual Tablets What is  this medication? NITROGLYCERIN (nye troe GLI ser in) prevents and treats chest pain (angina). It works by relaxing blood vessels, which decreases the amount of work the heart has to do. It belongs to a group of medications called nitrates. This medicine may be used for other purposes; ask your health care provider or pharmacist if you have questions. COMMON BRAND NAME(S): Nitroquick, Nitrostat, Nitrotab What should I tell my care team before I take this medication? They need to know if you have any of these conditions: Anemia Head injury, recent stroke, or bleeding in the brain Liver disease Previous heart attack An unusual or allergic reaction to nitroglycerin, other medications, foods, dyes, or preservatives Pregnant or trying to get pregnant Breast-feeding How should I use this medication? Take this medication by mouth as needed. Use at the first sign of an angina attack  (chest pain or tightness). You can also take this medication 5 to 10 minutes before an event likely to produce chest pain. Follow the directions exactly as written on the prescription label. Place one tablet under your tongue and let it dissolve. Do not swallow whole. Replace the dose if you accidentally swallow it. It will help if your mouth is not dry. Saliva around the tablet will help it to dissolve more quickly. Do not eat or drink, smoke or chew tobacco while a tablet is dissolving. Sit down when taking this medication. In an angina attack, you should feel better within 5 minutes after your first dose. You can take a dose every 5 minutes up to a total of 3 doses. If you do not feel better or feel worse after 1 dose, call 9-1-1 at once. Do not take more than 3 doses in 15 minutes. Your care team might give you other directions. Follow those directions if they do. Do not take your medication more often than directed. Talk to your care team about the use of this medication in children. Special care may be needed. Overdosage: If you think you have taken too much of this medicine contact a poison control center or emergency room at once. NOTE: This medicine is only for you. Do not share this medicine with others. What if I miss a dose? This does not apply. This medication is only used as needed. What may interact with this medication? Do not take this medication with any of the following: Certain migraine medications like ergotamine and dihydroergotamine (DHE) Medications used to treat erectile dysfunction like sildenafil, tadalafil, and vardenafil Riociguat This medication may also interact with the following: Alteplase Aspirin Heparin Medications for high blood pressure Medications for mental depression Other medications used to treat angina Phenothiazines like chlorpromazine, mesoridazine, prochlorperazine, thioridazine This list may not describe all possible interactions. Give your health  care provider a list of all the medicines, herbs, non-prescription drugs, or dietary supplements you use. Also tell them if you smoke, drink alcohol, or use illegal drugs. Some items may interact with your medicine. What should I watch for while using this medication? Tell your care team if you feel your medication is no longer working. Keep this medication with you at all times. Sit or lie down when you take your medication to prevent falling if you feel dizzy or faint after using it. Try to remain calm. This will help you to feel better faster. If you feel dizzy, take several deep breaths and lie down with your feet propped up, or bend forward with your head resting between your knees. You may get drowsy or dizzy.  Do not drive, use machinery, or do anything that needs mental alertness until you know how this medication affects you. Do not stand or sit up quickly, especially if you are an older patient. This reduces the risk of dizzy or fainting spells. Alcohol can make you more drowsy and dizzy. Avoid alcoholic drinks. Do not treat yourself for coughs, colds, or pain while you are taking this medication without asking your care team for advice. Some ingredients may increase your blood pressure. What side effects may I notice from receiving this medication? Side effects that you should report to your care team as soon as possible: Allergic reactions--skin rash, itching, hives, swelling of the face, lips, tongue, or throat Headache, unusual weakness or fatigue, shortness of breath, nausea, vomiting, rapid heartbeat, blue skin or lips, which may be signs of methemoglobinemia Increased pressure around the brain--severe headache, blurry vision, change in vision, nausea, vomiting Low blood pressure--dizziness, feeling faint or lightheaded, blurry vision Slow heartbeat--dizziness, feeling faint or lightheaded, confusion, trouble breathing, unusual weakness or fatigue Worsening chest pain (angina)--pain,  pressure, or tightness in the chest, neck, back, or arms Side effects that usually do not require medical attention (report to your care team if they continue or are bothersome): Dizziness Flushing Headache This list may not describe all possible side effects. Call your doctor for medical advice about side effects. You may report side effects to FDA at 1-800-FDA-1088. Where should I keep my medication? Keep out of the reach of children. Store at room temperature between 20 and 25 degrees C (68 and 77 degrees F). Store in Chief of Staff. Protect from light and moisture. Keep tightly closed. Throw away any unused medication after the expiration date. NOTE: This sheet is a summary. It may not cover all possible information. If you have questions about this medicine, talk to your doctor, pharmacist, or health care provider.  2023 Elsevier/Gold Standard (2020-10-03 00:00:00)

## 2021-12-27 NOTE — Progress Notes (Signed)
Cardiology Office Note:    Date:  12/27/2021   ID:  Earl Thomas, DOB 12-May-1951, MRN [id number]  PCP:  Horald Pollen, MD  Cardiologist:  Jenean Lindau, MD   Referring MD: Ezequiel Essex, MD    ASSESSMENT:    1. Shortness of breath   2. Aortic atherosclerosis (HCC)   3. Chest pain of uncertain etiology    PLAN:    In order of problems listed above:  Primary prevention stressed with patient.  Importance of compliance with diet and medication stressed any vocalized understanding. Chest pain: Atypical in nature.  Sublingual nitroglycerin prescription was sent, its protocol and 911 protocol explained and the patient vocalized understanding questions were answered to the patient's satisfaction.  We will analyze and assess his symptoms with a Lexiscan sestamibi and is agreeable.  In the interim if he has any significant symptoms he knows to go to the nearest emergency room.  Echocardiogram done recently and report was reviewed Aortic atherosclerosis: He will need guideline directed statin therapy this will be followed by primary doctor as felt appropriate. Patient will be seen in follow-up appointment in 9 months or earlier if the patient has any concerns    Medication Adjustments/Labs and Tests Ordered: Current medicines are reviewed at length with the patient today.  Concerns regarding medicines are outlined above.  Orders Placed This Encounter  Procedures   MYOCARDIAL PERFUSION IMAGING   EKG 12-Lead   Meds ordered this encounter  Medications   nitroGLYCERIN (NITROSTAT) 0.4 MG SL tablet    Sig: Place 1 tablet (0.4 mg total) under the tongue every 5 (five) minutes as needed.    Dispense:  25 tablet    Refill:  6     History of Present Illness:    Earl Thomas is a 71 y.o. male who is being seen today for the evaluation of chest pain at the request of Rancour, Annie Main, MD. patient has past medical history of interstitial lung disease.  He gives  history of shortness of breath and chest burning sensation.  This happened when he was resting.  Subsequently has not had any such A-fib.  He is concerned about the symptoms.  No chest tightness no radiation to the neck or to the arms.  Pain  Past Medical History:  Diagnosis Date   Abnormal weight loss 12/04/2021   Acute interstitial pneumonitis ([place]) 12/04/2021   Aortic atherosclerosis ([place]) 12/04/2021   Arthralgia of temporomandibular joint 12/04/2021   Arthritis    BMI 26.0-26.9,adult 10/22/2016   Chronic fatigue syndrome 12/04/2021   Dyslipidemia 03/10/2012   Family history of prostate cancer 12/04/2021   Glucose intolerance (impaired glucose tolerance)    Hyperlipidemia    Interstitial lung disease ([place]) 07/16/2019   Long-term current use of high risk medication other than anticoagulant 07/16/2019   Other long term (current) drug therapy 12/04/2021   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   aspirin EC 81 MG tablet Take 81 mg by mouth daily.  azelastine (ASTELIN) 0.1 % nasal spray Place 2 sprays into both nostrils 2 (two) times daily. Use in each nostril as directed   cetirizine (ZYRTEC) 10 MG tablet Take 10 mg by mouth daily.   diclofenac sodium (VOLTAREN) 1 % GEL Apply 2 g topically 4 (four) times daily.   famotidine (PEPCID) 20 MG tablet Take 1 tablet (20 mg total) by mouth 2 (two) times daily.   fluticasone (FLONASE) 50 MCG/ACT nasal spray Place 1 spray into both nostrils daily.    folic acid ([name]) 1 MG tablet Take 1 mg by mouth daily.   ipratropium (ATROVENT) 0.06 % nasal spray Place 1 spray into both nostrils 3 (three) times daily.   levocetirizine (XYZAL) 5 MG tablet Take 1 tablet (5 mg total) by mouth every evening.   loperamide (IMODIUM) 2 MG capsule Take 2 mg by mouth as needed for diarrhea or loose stools.   meloxicam (MOBIC) 7.5 MG tablet Take 1 tablet (7.5 mg total) by mouth daily as needed for pain.   methotrexate (RHEUMATREX) 10 MG tablet Take 10 mg by mouth once a week. Caution: Chemotherapy. Protect from light.   nitroGLYCERIN (NITROSTAT) 0.4 MG SL tablet Place 1 tablet (0.4 mg total) under the tongue every 5 (five) minutes as needed.   riTUXimab (RITUXAN IV) Inject into the vein.   SYMBICORT 80-4.5 MCG/ACT inhaler Inhale 2 puffs by mouth twice daily     Allergies:   Patient has no known allergies.   Social History   Socioeconomic History   Marital status: Married    Spouse name: Not on file   Number of children: 2   Years of education: Not on file   Highest education level: Not on file  Occupational History   Occupation: Professor  Tobacco Use   Smoking status: Former    Types: Pipe    Quit date: 10/06/1988    Years since quitting: 33.2   Smokeless tobacco: Never   Tobacco comments:    parent smoked in home as a child.  Smoked a pip in college, not daily.  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      Tobacco: pipe in 1980s      Alcohol: none      Exercise: joined [employer]; going 2-3 times per week.         Seatbelt:  100%   Social Determinants of Health   Financial Resource Strain: Low Risk  (11/19/2021)   Overall Financial Resource Strain (CARDIA)    Difficulty of Paying Living Expenses: Not hard at all  Food Insecurity: No Food Insecurity (11/19/2021)   Hunger Vital Sign    Worried About Running Out of Food in the Last Year: Never true    Ran Out of Food in the Last Year: Never true  Transportation Needs: No Transportation Needs (11/19/2021)   PRAPARE - Hydrologist (Medical): No    Lack of Transportation (Non-Medical): No  Physical Activity: Not on file  Stress:  No Stress Concern Present (11/19/2021)   [place]    Feeling of Stress : Not at all  Social Connections: [place] (11/19/2021)   Social Connection and Isolation Panel [NHANES]    Frequency of Communication with Friends and Family: More than three times a week    Frequency of Social Gatherings with Friends and Family: More than three times a week    Attends Religious Services: More than 4 times per year    Active Member of [employer] or Organizations: Yes    Attends Music therapist: More than 4 times per year    Marital Status: Married     Family History: The patient's family history includes Cancer (age of onset: 52) in his father; Cancer (age of onset: 66) in his mother; Diabetes in his brother and father; Heart disease in his brother and mother; Heart disease (age of onset: 40) in his father; Hyperlipidemia in his brother.  ROS:   Please see the history of present illness.    All other systems reviewed and are negative.  EKGs/Labs/Other Studies Reviewed:    The following studies were reviewed today: I discussed my findings with the patient at length EKG reveals sinus rhythm and nonspecific ST changes   Recent Labs: 12/26/2021: ALT 23; BUN 18; Creatinine 0.98; Hemoglobin 13.1; Platelet Count 240; Potassium  4.2; Sodium 140  Recent Lipid Panel    Component Value Date/Time   CHOL 119 12/04/2021 1114   CHOL 108 09/07/2019 0955   TRIG 64.0 12/04/2021 1114   HDL 34.40 (L) 12/04/2021 1114   HDL 31 (L) 09/07/2019 0955   CHOLHDL 3 12/04/2021 1114   VLDL 12.8 12/04/2021 1114   LDLCALC 72 12/04/2021 1114   LDLCALC 65 09/07/2019 0955    Physical Exam:    VS:  BP 96/60   Pulse (!) 111   Ht 5' 5" (1.651 m)   Wt 106 lb (48.1 kg)   SpO2 93%   BMI 17.64 kg/m     Wt Readings from Last 3 Encounters:  12/27/21 106 lb (48.1 kg)  12/26/21 106 lb 12.8 oz (48.4 kg)  12/25/21 106 lb 9.6 oz (48.4 kg)     GEN: Patient is in no acute distress HEENT: Normal NECK: No JVD; No carotid bruits LYMPHATICS: No lymphadenopathy CARDIAC: S1 S2 regular, 2/6 systolic murmur at the apex. RESPIRATORY:  Clear to auscultation without rales, wheezing or rhonchi  ABDOMEN: Soft, non-tender, non-distended MUSCULOSKELETAL:  No edema; No deformity  SKIN: Warm and dry NEUROLOGIC:  Alert and oriented x 3 PSYCHIATRIC:  Normal affect    Signed, Jenean Lindau, MD  12/27/2021 11:13 AM    [facility]

## 2021-12-28 DIAGNOSIS — R0602 Shortness of breath: Secondary | ICD-10-CM | POA: Diagnosis not present

## 2021-12-28 DIAGNOSIS — M0510 Rheumatoid lung disease with rheumatoid arthritis of unspecified site: Secondary | ICD-10-CM | POA: Diagnosis not present

## 2021-12-28 DIAGNOSIS — J849 Interstitial pulmonary disease, unspecified: Secondary | ICD-10-CM | POA: Diagnosis not present

## 2021-12-28 DIAGNOSIS — R0902 Hypoxemia: Secondary | ICD-10-CM | POA: Diagnosis not present

## 2021-12-28 DIAGNOSIS — Z87891 Personal history of nicotine dependence: Secondary | ICD-10-CM | POA: Diagnosis not present

## 2021-12-28 DIAGNOSIS — Z79899 Other long term (current) drug therapy: Secondary | ICD-10-CM | POA: Diagnosis not present

## 2021-12-28 DIAGNOSIS — Z79620 Long term (current) use of immunosuppressive biologic: Secondary | ICD-10-CM | POA: Diagnosis not present

## 2021-12-29 LAB — ALLERGENS, ZONE 2

## 2021-12-31 ENCOUNTER — Telehealth: Payer: Self-pay

## 2022-01-03 ENCOUNTER — Telehealth (HOSPITAL_COMMUNITY): Payer: Self-pay | Admitting: *Deleted

## 2022-01-03 ENCOUNTER — Encounter (HOSPITAL_COMMUNITY): Payer: Self-pay | Admitting: *Deleted

## 2022-01-03 NOTE — Telephone Encounter (Signed)
Left message on voicemail per DPR in reference to upcoming appointment scheduled on  7/6./23 with detailed instructions given per Myocardial Perfusion Study Information Sheet for the test. LM to arrive 15 minutes early, and that it is imperative to arrive on time for appointment to keep from having the test rescheduled. If you need to cancel or reschedule your appointment, please call the office within 24 hours of your appointment. Failure to do so may result in a cancellation of your appointment, and a $50 no show fee. Phone number given for call back for any questions. Kirstie Peri

## 2022-01-10 ENCOUNTER — Telehealth: Payer: Self-pay | Admitting: Cardiology

## 2022-01-10 ENCOUNTER — Ambulatory Visit (HOSPITAL_COMMUNITY): Payer: Medicare Other

## 2022-01-10 NOTE — Telephone Encounter (Signed)
Pt would like a callback regarding instructions on how to prepare for test on 01/14/22. Please advise

## 2022-01-10 NOTE — Telephone Encounter (Signed)
Called pt went over instructions for Myocardial Profusion Test.

## 2022-01-14 ENCOUNTER — Ambulatory Visit (HOSPITAL_COMMUNITY): Payer: Medicare Other | Attending: Cardiology

## 2022-01-14 ENCOUNTER — Encounter: Payer: Self-pay | Admitting: Emergency Medicine

## 2022-01-14 DIAGNOSIS — R0602 Shortness of breath: Secondary | ICD-10-CM | POA: Diagnosis not present

## 2022-01-14 LAB — MYOCARDIAL PERFUSION IMAGING
LV dias vol: 53 mL (ref 62–150)
LV sys vol: 31 mL
Nuc Stress EF: 65 %
Peak HR: 130 {beats}/min
Rest HR: 97 {beats}/min
Rest Nuclear Isotope Dose: 10.1 mCi
SDS: 0
SRS: 4
SSS: 4
ST Depression (mm): 0 mm
Stress Nuclear Isotope Dose: 30.6 mCi
TID: 0.89

## 2022-01-14 MED ORDER — TECHNETIUM TC 99M TETROFOSMIN IV KIT
30.6000 | PACK | Freq: Once | INTRAVENOUS | Status: AC | PRN
  Administered 2022-01-14: 30.6 via INTRAVENOUS

## 2022-01-14 MED ORDER — REGADENOSON 0.4 MG/5ML IV SOLN
0.4000 mg | Freq: Once | INTRAVENOUS | Status: AC
  Administered 2022-01-14: 0.4 mg via INTRAVENOUS

## 2022-01-14 MED ORDER — TECHNETIUM TC 99M TETROFOSMIN IV KIT
10.1000 | PACK | Freq: Once | INTRAVENOUS | Status: AC | PRN
  Administered 2022-01-14: 10.1 via INTRAVENOUS

## 2022-01-15 NOTE — Telephone Encounter (Signed)
Print out the form but we need details as to reason for cancellation.  Thanks.

## 2022-01-16 ENCOUNTER — Ambulatory Visit (INDEPENDENT_AMBULATORY_CARE_PROVIDER_SITE_OTHER): Payer: Medicare Other | Admitting: Internal Medicine

## 2022-01-16 ENCOUNTER — Encounter: Payer: Self-pay | Admitting: Internal Medicine

## 2022-01-16 VITALS — BP 114/62 | HR 135 | Ht 65.0 in | Wt 103.4 lb

## 2022-01-16 DIAGNOSIS — J849 Interstitial pulmonary disease, unspecified: Secondary | ICD-10-CM

## 2022-01-16 DIAGNOSIS — M0579 Rheumatoid arthritis with rheumatoid factor of multiple sites without organ or systems involvement: Secondary | ICD-10-CM | POA: Diagnosis not present

## 2022-01-16 MED ORDER — MELOXICAM 7.5 MG PO TABS: 7.5000 mg | ORAL_TABLET | Freq: Every day | ORAL | 2 refills | Status: DC | PRN

## 2022-01-16 MED ORDER — METHOTREXATE SODIUM 10 MG PO TABS: 10.0000 mg | ORAL_TABLET | ORAL | 2 refills | Status: DC

## 2022-01-16 NOTE — Patient Instructions (Addendum)
Please schedule follow up scheduled with myself in 3 months.  If my schedule is not open yet, we will contact you with a reminder closer to that time. Please call [phone] if you haven't heard from [place] a month before.   Follow up with the lung transplant team at [facility].   Call me if you need anything from me beforehand.  I have refilled your methotrexate and meloxicam until you can see Dr Trudie Reed again.   Labs looked ok from three weeks ago.   Focus on eating frequent smaller meals rather than three big meals. Try nutrient dense foods that are smaller in volume like nuts and nut butters, full fat dairy, eggs. Continue boost/ensures. Your weight has to come up for you to get a lung transplant.

## 2022-01-16 NOTE — Progress Notes (Signed)
Earl Thomas    [id number]    [birth year]  Primary Care Physician:Sagardia, Ines Bloomer, MD Date of Appointment: 01/16/2022 Established Patient Visit  Chief complaint:   Chief Complaint  Patient presents with   Follow-up    3 mo f/u. States the SOB is becoming more constant. Uses 3L of O2.     HPI: Earl Thomas is a 71 y.o. gentleman with history of RA-ILD. Intolerant of Ofev due to diarrhea. Had progression on  esbriet as well as weight loss and diarrhea.. CT scan showed progression. Discussed with Dr. Trudie Reed and we added rituximab in addition to methotrexate.   Interval Updates: Here for follow up. Has seen ILD clinic at [facility] and [facility]  Currently on 10 mg once a week of methotrexate and needs refills on this as well as meloxicam. Cannot get a hold of Dr. Trudie Reed office. I have reviewed his CBC and CMP from 3 weeks ago which are wnl.   Has been trying to put on weight with boost and ensure. Unfortuntaely still losing weight  He traveled to [place] with family a week ago.   Had a stress test earlier this week for cardiac evaluation which was normal.   Chronic rhinitis comes and goes. Has occasional lightheadedness and dizziness. Denies syncope or palpitations.   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   I have reviewed the patient's family social and past medical history and updated as appropriate.   Past Medical History:  Diagnosis Date   Abnormal weight loss 12/04/2021   Acute interstitial pneumonitis ([place]) 12/04/2021   Aortic atherosclerosis ([place]) 12/04/2021   Arthralgia of temporomandibular joint 12/04/2021   Arthritis    BMI 26.0-26.9,adult 10/22/2016   Chronic fatigue syndrome 12/04/2021   Dyslipidemia 03/10/2012   Family history of prostate cancer 12/04/2021   Glucose intolerance (impaired glucose tolerance)    Hyperlipidemia     Interstitial lung disease ([place]) 07/16/2019   Long-term current use of high risk medication other than anticoagulant 07/16/2019   Other long term (current) drug therapy 12/04/2021   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   Skin lesion of chest wall 12/04/2021    Past Surgical History:  Procedure Laterality Date   ROTATOR CUFF REPAIR Right 01/02/16    Family History  Problem Relation Age of Onset   Cancer Father 58       prostate cancer   Diabetes Father    Heart disease Father 49       AMI late 51s   Cancer Mother 25       Breast cancer   Heart disease Mother        CABG at age 41   Diabetes Brother    Heart disease Brother        AMI x 2; CABG   Hyperlipidemia Brother     Social History   Occupational History   Occupation: Professor  Tobacco Use   Smoking status: Former    Types: Pipe    Quit date: 10/06/1988    Years since quitting: 33.3   Smokeless tobacco: Never   Tobacco comments:    parent smoked in home as a child.  Smoked a pip in college, not  daily.  Vaping Use   Vaping Use: Never used  Substance and Sexual Activity   Alcohol use: No   Drug use: No   Sexual activity: Yes    Physical Exam: Blood pressure 114/62, pulse (!) 135, height _0  (1.651 m), weight 103 lb 6.4 oz (46.9 kg), SpO2 99 %.  Gen:     Thin, appears o Lungs:    bibasilar crackles, resting tachypnea CV:     resting tachycardia MSK:     bilateral MCP and PCP joints are swollen, tender Ext: clubbing, no edema  Data Reviewed: Imaging: I have personally reviewed the CT scan from Nov 2020 which demonstrates bibasilar honeycombing consistent with UIP pattern. On the 2023 feb scan there is progression of disease.   Echocardiogram 1. Left ventricular ejection fraction, by estimation, is 60 to 65%. The  left ventricle has  normal function. The left ventricle has no regional  wall motion abnormalities. Left ventricular diastolic parameters are  consistent with Grade I diastolic  dysfunction (impaired relaxation).   2. Right ventricular systolic function is normal. The right ventricular  size is normal. There is normal pulmonary artery systolic pressure.   3. The mitral valve is normal in structure. Mild mitral valve  regurgitation. No evidence of mitral stenosis.   4. The aortic valve is normal in structure. Aortic valve regurgitation is  not visualized. No aortic stenosis is present.   5. The inferior vena cava is normal in size with greater than 50%  respiratory variability, suggesting right atrial pressure of 3 mmHg.   PFTs:      Latest Ref Rng & Units 10/29/2021    3:00 PM 04/18/2021    3:05 PM 11/13/2020    3:07 PM 08/07/2020   10:04 AM 07/26/2019   10:56 AM  PFT Results  FVC-Pre L 1.22  1.71  1.71  1.37  1.52   FVC-Predicted Pre % 38  54  54  43  47   FVC-Post L     1.82   FVC-Predicted Post %     57   Pre FEV1/FVC % % 99  94  92  100  100   Post FEV1/FCV % %     88   FEV1-Pre L 1.21  1.61  1.57  1.37  1.52   FEV1-Predicted Pre % 50  67  65  57  63   FEV1-Post L     1.61   DLCO uncorrected ml/min/mmHg 8.80  10.08  11.16  9.68  11.79   DLCO UNC% % 39  45  49  42  52   DLCO corrected ml/min/mmHg 8.80   11.16  9.68    DLCO COR %Predicted % 39   49  42    DLVA Predicted % 55  71  104  73  82   TLC L     3.42   TLC % Predicted %     57   RV % Predicted %     84    I have personally reviewed the patient's PFTs and which show moderate restriction with an FVC of 54% which is stable from previous PFTs.    Labs: I have reviewed his outside hospital labs noted on March 23 2019 progress note, his CCP level was greater than 255, Rheumatoid factor was 149 his, ESR was 109 his uric acid was normal. LFTs reviewed 03/29/21 shows normal liver function.   Immunization status: Immunization History   Administered Date(s) Administered   [employer]  Quad(high Dose 65+) 04/13/2020, 04/18/2021   Influenza, High Dose Seasonal PF 04/24/2019   Influenza,inj,Quad PF,6+ Mos 05/30/2015, 04/23/2016, 05/14/2017, 03/25/2018   Influenza-Unspecified 04/07/2014   PFIZER(Purple Top)SARS-COV-2 Vaccination 07/30/2019, 08/20/2019, 04/27/2020   PNEUMOCOCCAL CONJUGATE-20 12/25/2020   Pfizer Covid-19 Vaccine Bivalent Booster 73yr & up 04/24/2021   Pneumococcal Conjugate-13 05/18/2018   Pneumococcal Polysaccharide-23 06/09/2019   Td 03/10/2021   Tdap 02/13/2009, 05/14/2017   Zoster Recombinat (Shingrix) 05/27/2018, 01/19/2019   Zoster, Live 04/12/2014    Assessment:  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   [facility]   I have refilled your methotrexate and meloxicam until you can see Dr HTrudie Reedagain.   Labs looked ok from three weeks ago.   Focus on eating frequent smaller meals rather than three big meals. Try nutrient dense foods that are smaller in volume like nuts and nut butters, full fat dairy,eggs.  He has excellent social support and few co-morbidities. But BMI needs to come up for lung transplant.   Return to Care: Return in about 3 months (around 04/18/2022).   [name] MD Pulmonary and [place]

## 2022-01-17 ENCOUNTER — Encounter: Payer: Self-pay | Admitting: Emergency Medicine

## 2022-01-18 ENCOUNTER — Emergency Department (HOSPITAL_BASED_OUTPATIENT_CLINIC_OR_DEPARTMENT_OTHER): Payer: Medicare Other

## 2022-01-18 ENCOUNTER — Inpatient Hospital Stay (HOSPITAL_BASED_OUTPATIENT_CLINIC_OR_DEPARTMENT_OTHER)
Admission: EM | Admit: 2022-01-18 | Discharge: 2022-01-21 | DRG: 871 | Disposition: A | Discharge status: Home Health | Payer: Medicare Other | Attending: Internal Medicine | Admitting: Internal Medicine

## 2022-01-18 ENCOUNTER — Telehealth (INDEPENDENT_AMBULATORY_CARE_PROVIDER_SITE_OTHER): Payer: Medicare Other | Admitting: Nurse Practitioner

## 2022-01-18 ENCOUNTER — Other Ambulatory Visit: Payer: Self-pay

## 2022-01-18 ENCOUNTER — Encounter (HOSPITAL_BASED_OUTPATIENT_CLINIC_OR_DEPARTMENT_OTHER): Payer: Self-pay

## 2022-01-18 VITALS — HR 111 | Temp 97.1°F

## 2022-01-18 DIAGNOSIS — J9601 Acute respiratory failure with hypoxia: Secondary | ICD-10-CM | POA: Diagnosis not present

## 2022-01-18 DIAGNOSIS — K219 Gastro-esophageal reflux disease without esophagitis: Secondary | ICD-10-CM | POA: Diagnosis present

## 2022-01-18 DIAGNOSIS — E440 Moderate protein-calorie malnutrition: Secondary | ICD-10-CM | POA: Diagnosis present

## 2022-01-18 DIAGNOSIS — Z87891 Personal history of nicotine dependence: Secondary | ICD-10-CM

## 2022-01-18 DIAGNOSIS — Z7951 Long term (current) use of inhaled steroids: Secondary | ICD-10-CM

## 2022-01-18 DIAGNOSIS — A419 Sepsis, unspecified organism: Secondary | ICD-10-CM | POA: Diagnosis present

## 2022-01-18 DIAGNOSIS — Z79890 Hormone replacement therapy (postmenopausal): Secondary | ICD-10-CM | POA: Diagnosis not present

## 2022-01-18 DIAGNOSIS — J849 Interstitial pulmonary disease, unspecified: Secondary | ICD-10-CM

## 2022-01-18 DIAGNOSIS — M0510 Rheumatoid lung disease with rheumatoid arthritis of unspecified site: Secondary | ICD-10-CM | POA: Diagnosis present

## 2022-01-18 DIAGNOSIS — E785 Hyperlipidemia, unspecified: Secondary | ICD-10-CM | POA: Diagnosis present

## 2022-01-18 DIAGNOSIS — G9332 Myalgic encephalomyelitis/chronic fatigue syndrome: Secondary | ICD-10-CM | POA: Diagnosis present

## 2022-01-18 DIAGNOSIS — J9621 Acute and chronic respiratory failure with hypoxia: Secondary | ICD-10-CM | POA: Diagnosis not present

## 2022-01-18 DIAGNOSIS — J189 Pneumonia, unspecified organism: Secondary | ICD-10-CM | POA: Insufficient documentation

## 2022-01-18 DIAGNOSIS — Z83438 Family history of other disorder of lipoprotein metabolism and other lipidemia: Secondary | ICD-10-CM | POA: Diagnosis not present

## 2022-01-18 DIAGNOSIS — R6520 Severe sepsis without septic shock: Secondary | ICD-10-CM | POA: Diagnosis not present

## 2022-01-18 DIAGNOSIS — R059 Cough, unspecified: Secondary | ICD-10-CM | POA: Diagnosis not present

## 2022-01-18 DIAGNOSIS — Z79899 Other long term (current) drug therapy: Secondary | ICD-10-CM | POA: Diagnosis not present

## 2022-01-18 DIAGNOSIS — I700 Atherosclerosis of aorta: Secondary | ICD-10-CM | POA: Diagnosis present

## 2022-01-18 DIAGNOSIS — J982 Interstitial emphysema: Secondary | ICD-10-CM | POA: Diagnosis present

## 2022-01-18 DIAGNOSIS — I5032 Chronic diastolic (congestive) heart failure: Secondary | ICD-10-CM | POA: Diagnosis present

## 2022-01-18 DIAGNOSIS — Z8249 Family history of ischemic heart disease and other diseases of the circulatory system: Secondary | ICD-10-CM | POA: Diagnosis not present

## 2022-01-18 DIAGNOSIS — R000 Tachycardia, unspecified: Secondary | ICD-10-CM | POA: Diagnosis not present

## 2022-01-18 DIAGNOSIS — Z6826 Body mass index (BMI) 26.0-26.9, adult: Secondary | ICD-10-CM

## 2022-01-18 DIAGNOSIS — Z7982 Long term (current) use of aspirin: Secondary | ICD-10-CM

## 2022-01-18 DIAGNOSIS — R0600 Dyspnea, unspecified: Secondary | ICD-10-CM | POA: Diagnosis not present

## 2022-01-18 DIAGNOSIS — J479 Bronchiectasis, uncomplicated: Secondary | ICD-10-CM | POA: Diagnosis not present

## 2022-01-18 DIAGNOSIS — J1282 Pneumonia due to coronavirus disease 2019: Secondary | ICD-10-CM | POA: Diagnosis present

## 2022-01-18 DIAGNOSIS — R7989 Other specified abnormal findings of blood chemistry: Secondary | ICD-10-CM | POA: Diagnosis not present

## 2022-01-18 DIAGNOSIS — A4189 Other specified sepsis: Principal | ICD-10-CM | POA: Diagnosis present

## 2022-01-18 DIAGNOSIS — U071 COVID-19: Secondary | ICD-10-CM | POA: Diagnosis present

## 2022-01-18 DIAGNOSIS — J61 Pneumoconiosis due to asbestos and other mineral fibers: Secondary | ICD-10-CM | POA: Diagnosis not present

## 2022-01-18 LAB — CBC WITH DIFFERENTIAL/PLATELET
Abs Immature Granulocytes: 0.04 10*3/uL (ref 0.00–0.07)
Basophils Absolute: 0 10*3/uL (ref 0.0–0.1)
Basophils Relative: 0 %
Eosinophils Absolute: 0.2 10*3/uL (ref 0.0–0.5)
Eosinophils Relative: 2 %
HCT: 38.8 % — ABNORMAL LOW (ref 39.0–52.0)
Hemoglobin: 12.3 g/dL — ABNORMAL LOW (ref 13.0–17.0)
Immature Granulocytes: 0 %
Lymphocytes Relative: 10 %
Lymphs Abs: 1 10*3/uL (ref 0.7–4.0)
MCH: 27.2 pg (ref 26.0–34.0)
MCHC: 31.7 g/dL (ref 30.0–36.0)
MCV: 85.8 fL (ref 80.0–100.0)
Monocytes Absolute: 0.7 10*3/uL (ref 0.1–1.0)
Monocytes Relative: 7 %
Neutro Abs: 8.1 10*3/uL — ABNORMAL HIGH (ref 1.7–7.7)
Neutrophils Relative %: 81 %
Platelets: 211 10*3/uL (ref 150–400)
RBC: 4.52 MIL/uL (ref 4.22–5.81)
RDW: 13.6 % (ref 11.5–15.5)
WBC: 10 10*3/uL (ref 4.0–10.5)
nRBC: 0 % (ref 0.0–0.2)

## 2022-01-18 LAB — RESP PANEL BY RT-PCR (FLU A&B, COVID) ARPGX2
Influenza A by PCR: NEGATIVE
Influenza B by PCR: NEGATIVE
SARS Coronavirus 2 by RT PCR: POSITIVE — AB

## 2022-01-18 LAB — URINALYSIS, ROUTINE W REFLEX MICROSCOPIC
Bilirubin Urine: NEGATIVE
Glucose, UA: NEGATIVE mg/dL
Hgb urine dipstick: NEGATIVE
Ketones, ur: NEGATIVE mg/dL
Leukocytes,Ua: NEGATIVE
Nitrite: NEGATIVE
Protein, ur: NEGATIVE mg/dL
Specific Gravity, Urine: 1.01 (ref 1.005–1.030)
pH: 6.5 (ref 5.0–8.0)

## 2022-01-18 LAB — BASIC METABOLIC PANEL
Anion gap: 7 (ref 5–15)
BUN: 16 mg/dL (ref 8–23)
CO2: 27 mmol/L (ref 22–32)
Calcium: 8.4 mg/dL — ABNORMAL LOW (ref 8.9–10.3)
Chloride: 101 mmol/L (ref 98–111)
Creatinine, Ser: 1.03 mg/dL (ref 0.61–1.24)
GFR, Estimated: >60 mL/min (ref >60)
Glucose, Bld: 123 mg/dL — ABNORMAL HIGH (ref 70–99)
Potassium: 3.8 mmol/L (ref 3.5–5.1)
Sodium: 135 mmol/L (ref 135–145)

## 2022-01-18 LAB — LACTIC ACID, PLASMA: Lactic Acid, Venous: 1.9 mmol/L (ref 0.5–1.9)

## 2022-01-18 MED ORDER — SODIUM CHLORIDE 0.9 % IV SOLN
500.0000 mg | INTRAVENOUS | Status: DC
  Administered 2022-01-18 – 2022-01-20 (×3): 500 mg via INTRAVENOUS
  Filled 2022-01-18 (×3): qty 5

## 2022-01-18 MED ORDER — BENZONATATE 100 MG PO CAPS
100.0000 mg | ORAL_CAPSULE | Freq: Once | ORAL | Status: AC
Start: 2022-01-18 — End: 2022-01-18
  Administered 2022-01-18: 100 mg via ORAL
  Filled 2022-01-18: qty 1

## 2022-01-18 MED ORDER — SODIUM CHLORIDE 0.9 % IV SOLN: 100.0000 mg | Freq: Every day | INTRAVENOUS | Status: DC

## 2022-01-18 MED ORDER — LACTATED RINGERS IV BOLUS (SEPSIS)
1000.0000 mL | Freq: Once | INTRAVENOUS | Status: AC
  Administered 2022-01-18: 1000 mL via INTRAVENOUS

## 2022-01-18 MED ORDER — SODIUM CHLORIDE 0.9 % IV SOLN: 200.0000 mg | Freq: Once | INTRAVENOUS | Status: DC

## 2022-01-18 MED ORDER — METHYLPREDNISOLONE SODIUM SUCC 40 MG IJ SOLR
40.0000 mg | Freq: Two times a day (BID) | INTRAMUSCULAR | Status: DC
  Administered 2022-01-18: 40 mg via INTRAVENOUS
  Filled 2022-01-18: qty 1

## 2022-01-18 MED ORDER — IOHEXOL 350 MG/ML SOLN
75.0000 mL | Freq: Once | INTRAVENOUS | Status: AC | PRN
  Administered 2022-01-18: 75 mL via INTRAVENOUS

## 2022-01-18 MED ORDER — LACTATED RINGERS IV SOLN: INTRAVENOUS | Status: AC

## 2022-01-18 MED ORDER — SODIUM CHLORIDE 0.9 % IV SOLN
100.0000 mg | INTRAVENOUS | Status: AC
  Administered 2022-01-18: 100 mg via INTRAVENOUS

## 2022-01-18 MED ORDER — SODIUM CHLORIDE 0.9 % IV SOLN
100.0000 mg | Freq: Every day | INTRAVENOUS | Status: AC
  Administered 2022-01-19: 100 mg via INTRAVENOUS
  Filled 2022-01-18 (×2): qty 20

## 2022-01-18 MED ORDER — SODIUM CHLORIDE 0.9 % IV SOLN
2.0000 g | INTRAVENOUS | Status: DC
  Administered 2022-01-18 – 2022-01-20 (×3): 2 g via INTRAVENOUS
  Filled 2022-01-18 (×3): qty 20

## 2022-01-18 MED ORDER — LACTATED RINGERS IV BOLUS (SEPSIS)
500.0000 mL | Freq: Once | INTRAVENOUS | Status: AC
  Administered 2022-01-18: 500 mL via INTRAVENOUS

## 2022-01-18 NOTE — ED Triage Notes (Signed)
Patient states he has been SOB for about one week. Endorses diarrhea. Yellow sputum with cough. PCP sent for further eval

## 2022-01-18 NOTE — Assessment & Plan Note (Addendum)
Acute, patient at high risk for poor outcome due to hx of ILD. Additionally his oxygen saturation was originally less than 90% using his at home pulse oximeter.  Not sure if this was accurate or not, pulse oximeter did eventually read 95% on his 3 L/min.  Patient's heart rate was 111 at the time pulse oximeter read 95%.  A long discussion regarding risk versus benefit of treating outpatient versus inpatient was completed.  Due to his high risk for poor outcomes I recommended he proceed to the emergency department for evaluation for possible pneumonia and to determine whether or not he may be septic.  He and his wife reports their understanding and tell me they will proceed to the ER this evening.

## 2022-01-18 NOTE — ED Notes (Signed)
RT assessed patient upon arrival to room. Patient stated has been more SOB with productive cough. Has been wearing 3L around the clock for 2 days. After getting undressed, SAT 82% non 3L. Increased O2 to 5L. RN at bedside and MD aware. Patient getting chest xray

## 2022-01-18 NOTE — Progress Notes (Signed)
   Established Patient Office Visit   An audio-only tele-health visit was completed today for this patient. I connected with  Earl Thomas on 01/18/22 utilizing audio-only technology and verified that I am speaking with the correct person using two identifiers. The patient was located at their home, and I was located at the office of [place] at [facility] during the encounter. I discussed the limitations of evaluation and management by telemedicine. The patient expressed understanding and agreed to proceed.     Subjective   Patient ID: Earl Thomas, male    DOB: Apr 29, 1951  Age: 71 y.o. MRN: [id number]  Chief Complaint  Patient presents with   Cough   Wife also present on phone call to assist with visit today.  Symptoms started 10 days ago while on vacation in [place]. He is experiencing productive cough with chills. Took covid 19 test today which was negative. Has ILD and on oxygen chronically (3LPM in the day and 2LPM at night).     Review of Systems  Constitutional:  Positive for chills (took tylenol).  Respiratory:  Positive for cough, sputum production (yellow brown), shortness of breath and wheezing.   Cardiovascular:  Negative for chest pain (had chest pain in [place]; has resolved now).      Objective:     Pulse (!) 111   Temp (!) 97.1 F (36.2 C) Comment: oral  SpO2 95% Comment: on 3LPM (was initially 88%) BP Readings from Last 3 Encounters:  01/16/22 114/62  12/27/21 96/60  12/26/21 128/70   Wt Readings from Last 3 Encounters:  01/16/22 103 lb 6.4 oz (46.9 kg)  01/14/22 106 lb (48.1 kg)  12/27/21 106 lb (48.1 kg)      Physical Exam Comprehensive physical exam not completed today as office visit was conducted remotely.  Patient in no acute distress, coughed a few times while completing visit, was able to speak without having to stop to breathe.  Patient was alert and oriented, and appeared to have appropriate judgment.   No  results found for any visits on 01/18/22.    The ASCVD Risk score (Arnett DK, et al., 2019) failed to calculate for the following reasons:   The valid total cholesterol range is 130 to 320 mg/dL    Assessment & Plan:   Problem List Items Addressed This Visit       Respiratory   Community acquired pneumonia - Primary    Acute, patient at high risk for poor outcome due to hx of ILD. Additionally his oxygen saturation was originally less than 90% using his at home pulse oximeter.  Not sure if this was accurate or not, pulse oximeter did eventually read 95% on his 3 L/min.  Patient's heart rate was 111 at the time pulse oximeter read 95%.  A long discussion regarding risk versus benefit of treating outpatient versus inpatient was completed.  Due to his high risk for poor outcomes I recommended he proceed to the emergency department for evaluation for possible pneumonia and to determine whether or not he may be septic.  He and his wife reports their understanding and tell me they will proceed to the ER this evening.       Return if symptoms worsen or fail to improve.  Total time spent on the telephone today was 31 minutes.   Ailene Ards, NP

## 2022-01-18 NOTE — ED Provider Notes (Signed)
[facility] EMERGENCY DEPARTMENT Provider Note   CSN: [id number] Arrival date & time: 01/18/22  1827     History  Chief Complaint  Patient presents with   Shortness of Breath    Earl Thomas is a 71 y.o. male.  The history is provided by the patient, the spouse and medical records. No language interpreter was used.  Shortness of Breath    Earl Thomas is a 71 yo male with history of interstitial lung disease and rheumatoid arthritis presents to the ED today for SOB, cough, and fever. Patient reports that 10 days ago, He was in [place] and felt ill. Had some congestion, cough, chills, an diarrhea. 7 days ago, he developed a productive cough and shortness of breath. Baseline, he has a dry cough and on 3L of NS in morning and 2L NS at night d/t his ILD. He reports that despite following with treatment with his ILD, he continues to have productive cough and SOB. He has taken robitussin for the cough but it did not help. He has had chills and has a 102F fever yesterday. Taking Tylenol to lower fever. Last time he took tylenol was this morning and he reports no fever today. He took a covid test today, which was negative. His symptoms include productive cough, chest congestion, chills, nausea, dizziness, lightheadedness, decreased appetite, palpitations, diarrhea. Denies chest pain, vomiting, abdominal pain. Patient's wife at bedside and endorses her husband seems sleepier than usual and "slower" to respond at times.   Home Medications Prior to Admission medications   Medication Sig Start Date End Date Taking? Authorizing Provider  albuterol (VENTOLIN HFA) 108 (90 Base) MCG/ACT inhaler Inhale 2 puffs into the lungs every 6 (six) hours as needed for wheezing or shortness of breath. 07/19/21   Spero Geralds, MD  aspirin EC 81 MG tablet Take 81 mg by mouth daily.    [provider]  azelastine (ASTELIN) 0.1 % nasal spray Place 2 sprays into both nostrils 2 (two) times  daily. Use in each nostril as directed 12/25/21   Roney Marion, MD  cetirizine (ZYRTEC) 10 MG tablet Take 10 mg by mouth daily.    [provider]  diclofenac sodium (VOLTAREN) 1 % GEL Apply 2 g topically 4 (four) times daily. 09/18/18   Forrest Moron, MD  famotidine (PEPCID) 20 MG tablet Take 1 tablet (20 mg total) by mouth 2 (two) times daily. 12/25/21   Roney Marion, MD  fluticasone (FLONASE) 50 MCG/ACT nasal spray Place 1 spray into both nostrils daily. 08/22/20   Spero Geralds, MD  folic acid ([name]) 1 MG tablet Take 1 mg by mouth daily. 08/27/19   [provider]  ipratropium (ATROVENT) 0.06 % nasal spray Place 1 spray into both nostrils 3 (three) times daily. 11/13/20   Spero Geralds, MD  levocetirizine (XYZAL) 5 MG tablet Take 1 tablet (5 mg total) by mouth every evening. 12/25/21   Roney Marion, MD  loperamide (IMODIUM) 2 MG capsule Take 2 mg by mouth as needed for diarrhea or loose stools.    [provider]  meloxicam (MOBIC) 7.5 MG tablet Take 1 tablet (7.5 mg total) by mouth daily as needed for pain. 01/16/22   Spero Geralds, MD  methotrexate (RHEUMATREX) 10 MG tablet Take 1 tablet (10 mg total) by mouth once a week. Caution: Chemotherapy. Protect from light. 01/16/22   Spero Geralds, MD  nitroGLYCERIN (NITROSTAT) 0.4 MG SL tablet Place 1 tablet (0.4 mg total)  under the tongue every 5 (five) minutes as needed. 12/27/21 03/27/22  Revankar, Reita Cliche, MD  riTUXimab (RITUXAN IV) Inject into the vein.    [provider]  SYMBICORT 80-4.5 MCG/ACT inhaler Inhale 2 puffs by mouth twice daily 11/28/21   Horald Pollen, MD      Allergies    Patient has no known allergies.    Review of Systems   Review of Systems  Respiratory:  Positive for shortness of breath.   All other systems reviewed and are negative.   Physical Exam Updated Vital Signs BP (!) 99/57 (BP Location: Left Arm)   Pulse (!) 119   Temp 98.3 F (36.8 C) (Oral)    Resp (!) 25   SpO2 95%  Physical Exam Vitals and nursing note reviewed.  Constitutional:      General: He is not in acute distress.    Appearance: He is well-developed.     Comments: Ill-appearing male appears to be in moderate respiratory discomfort  HENT:     Head: Atraumatic.  Eyes:     Conjunctiva/sclera: Conjunctivae normal.  Cardiovascular:     Rate and Rhythm: Tachycardia present.  Pulmonary:     Effort: Pulmonary effort is normal.     Breath sounds: Normal breath sounds. No wheezing or rhonchi.  Musculoskeletal:     Cervical back: Neck supple.     Right lower leg: No edema.     Left lower leg: No edema.  Skin:    Findings: No rash.  Neurological:     Mental Status: He is alert and oriented to person, place, and time.     ED Results / Procedures / Treatments   Labs (all labs ordered are listed, but only abnormal results are displayed) Labs Reviewed  RESP PANEL BY RT-PCR (FLU A&B, COVID) ARPGX2 - Abnormal; Notable for the following components:      Result Value   SARS Coronavirus 2 by RT PCR POSITIVE (*)    All other components within normal limits  CBC WITH DIFFERENTIAL/PLATELET - Abnormal; Notable for the following components:   Hemoglobin 12.3 (*)    HCT 38.8 (*)    Neutro Abs 8.1 (*)    All other components within normal limits  BASIC METABOLIC PANEL - Abnormal; Notable for the following components:   Glucose, Bld 123 (*)    Calcium 8.4 (*)    All other components within normal limits  CULTURE, BLOOD (ROUTINE X 2)  CULTURE, BLOOD (ROUTINE X 2)  URINE CULTURE  LACTIC ACID, PLASMA  URINALYSIS, ROUTINE W REFLEX MICROSCOPIC  LACTIC ACID, PLASMA  PROTIME-INR  APTT    EKG None  Radiology CT Angio Chest PE W and/or Wo Contrast  Result Date: 01/18/2022 CLINICAL DATA:  Pulmonary embolism (PE) suspected, high prob EXAM: CT ANGIOGRAPHY CHEST WITH CONTRAST TECHNIQUE: Multidetector CT imaging of the chest was performed using the standard protocol during bolus  administration of intravenous contrast. Multiplanar CT image reconstructions and MIPs were obtained to evaluate the vascular anatomy. RADIATION DOSE REDUCTION: This exam was performed according to the departmental dose-optimization program which includes automated exposure control, adjustment of the mA and/or kV according to patient size and/or use of iterative reconstruction technique. CONTRAST:  18m OMNIPAQUE IOHEXOL 350 MG/ML SOLN COMPARISON:  11/10/2021 FINDINGS: Cardiovascular: There is adequate opacification of the pulmonary arterial tree. No intraluminal filling defect identified to suggest acute pulmonary embolism. No significant coronary artery calcification. Global cardiac size within normal limits. No pericardial effusion. Central pulmonary arteries are of  normal caliber. The thoracic aorta is unremarkable. Mediastinum/Nodes: There has developed moderate pneumomediastinum within the anterior mediastinum best appreciated substernally. Gas is seen tracking into the aortopulmonary groove as well as the right neck base. Visualized thyroid is unremarkable. Enhancing soft tissue within the subcarinal and bilateral hilar regions may represent reactive adenopathy. No pathologically enlarged thoracic adenopathy. Esophagus is unremarkable. Small hiatal hernia. Lungs/Pleura: There is extensive subpleural architectural distortion with honeycombing demonstrating a basilar predominance with associated traction bronchiectasis in keeping with a UIP pattern of interstitial lung disease. There has developed mild focal consolidation within the posterior basal right lower lobe which may represent superimposed acute infection in the appropriate clinical setting. More sparse infiltrate is seen diffusely throughout the right lung base, best appreciated on axial image # 58/7. No central obstructing lesion. No pneumothorax or pleural effusion. Upper Abdomen: No acute abnormality. Musculoskeletal: No acute bone abnormality. No  lytic or blastic bone lesion. Review of the MIP images confirms the above findings. IMPRESSION: 1. No pulmonary embolism. 2. Interval development of moderate pneumomediastinum. No pneumothorax. 3. Interval development of focal consolidation within the posterior basal right lower lobe which may represent superimposed acute infection in the appropriate clinical setting. More sparse infiltrate is seen diffusely throughout the right lung base. 4. UIP pattern of interstitial lung disease, grossly stable since prior examination. Findings are consistent with UIP per consensus guidelines: Diagnosis of Idiopathic Pulmonary Fibrosis: An Official ATS/ERS/JRS/ALAT Clinical Practice Guideline. [place], Iss 5, [phone], Mar 08 2017. Electronically Signed   By: Fidela Salisbury M.D.   On: 01/18/2022 20:38   DG Chest Portable 1 View  Result Date: 01/18/2022 CLINICAL DATA:  Cough EXAM: PORTABLE CHEST 1 VIEW COMPARISON:  11/10/2021 FINDINGS: Severe chronic interstitial disease throughout the lungs compatible with severe chronic fibrosis as seen on prior plain films and CT. No definite acute areas of consolidation or effusions. Heart is normal size. No acute bony abnormality. IMPRESSION: Severe chronic lung disease/fibrosis.  No definite acute process. Electronically Signed   By: Rolm Baptise M.D.   On: 01/18/2022 18:57    Procedures .Critical Care  Performed by: Domenic Moras, PA-C Authorized by: Domenic Moras, PA-C   Critical care provider statement:    Critical care time (minutes):  78   Critical care was time spent personally by me on the following activities:  Development of treatment plan with patient or surrogate, discussions with consultants, evaluation of patient's response to treatment, examination of patient, ordering and review of laboratory studies, ordering and review of radiographic studies, ordering and performing treatments and interventions, pulse oximetry, re-evaluation of patient's  condition and review of old charts     Medications Ordered in ED Medications  lactated ringers infusion ( Intravenous New Bag/Given 01/18/22 2049)  cefTRIAXone (ROCEPHIN) 2 g in sodium chloride 0.9 % 100 mL IVPB (0 g Intravenous Stopped 01/18/22 1959)  azithromycin (ZITHROMAX) 500 mg in sodium chloride 0.9 % 250 mL IVPB (0 mg Intravenous Stopped 01/18/22 2145)  methylPREDNISolone sodium succinate (SOLU-MEDROL) 40 mg/mL injection 40 mg (has no administration in time range)  remdesivir 200 mg in sodium chloride 0.9% 250 mL IVPB (has no administration in time range)    Followed by  remdesivir 100 mg in sodium chloride 0.9 % 100 mL IVPB (has no administration in time range)  lactated ringers bolus 1,000 mL (0 mLs Intravenous Stopped 01/18/22 2010)    And  lactated ringers bolus 500 mL (0 mLs Intravenous Stopped 01/18/22 1953)  iohexol (OMNIPAQUE)  350 MG/ML injection 75 mL (75 mLs Intravenous Contrast Given 01/18/22 2008)  benzonatate (TESSALON) capsule 100 mg (100 mg Oral Given 01/18/22 2222)    ED Course/ Medical Decision Making/ A&P                           Medical Decision Making Amount and/or Complexity of Data Reviewed Labs: ordered. Radiology: ordered.  Risk Prescription drug management.   BP (!) 99/57 (BP Location: Left Arm)   Pulse (!) 119   Temp 98.3 F (36.8 C) (Oral)   Resp (!) 25   SpO2 95%   6:56 PM This is a 71 year old male with significant history of interstitial lung disease, rheumatoid lung disease, chronic fatigue syndrome, sent here from PCP office with concerns worsening shortness of breath.  Patient his wife traveled to [place] several weeks prior.  Wife developed some viral illness for several days but that has since resolved.  Patient however reported having productive cough, fever as high as 103, chills, and now increased shortness of breath ongoing for the past week.  He is normally on 3 L of oxygen and having to bump up his O2 to 4 L.  He voiced concerns for  potential lung infection.  He tries taking several home COVID test which came back negative.  He did reach out to his PCP and had a virtual visit yesterday and was recommended to come to the ER for further assessment.  On exam, this is a ill-appearing male in some mild to moderate respiratory discomfort while being on 3 L of oxygen.  He is tachypneic, speaking in short sentences.  Lung exam remarkable for diffuse crackles and rhonchi heard.  He is currently afebrile however he did report taking Tylenol for his fever earlier today.  Vital signs remarkable for blood pressure of 99/57, tachycardia with heart rate of 119, tachypneic with respiratory rate of 25, and as mentioned in O2 sats at 95% on 3 L.  I have initiated code sepsis and will treat patient with both IV fluid and antibiotic which includes Rocephin and Zithromax.  Anticipate hospital admission for further care.  Chest x-ray obtained today independently reviewed interpreted by me which shows severe chronic lung disease without any obvious focal infiltrate.  However, I am still concerned that patient has pneumonia and will treat as such.  Consider patient recently traveled to [facility] and back, PE is also on the differential. Will obtain Chest CT angio for further assessment.   9:29 PM Labs, EKG, and imaging independently viewed interpreted by me and I agree with radiologist interpretation.  COVID test is currently pending, urinalysis without signs of urine tract infection, normal WBC, normal lactic acid, electrolyte panels are reassuring, chest CT angiogram obtained show no evidence of PE however there is an interval development of a moderate pneumomediastinum but no evidence of pneumothorax.  Possible right lower lobe pneumonia.  I appreciate consultation from pulmonary critical care specialist Dr. Elsworth Soho who recommend patient to be admitted, and repeat chest x-ray tomorrow as well as provide adequate management for his cough.  He agrees with  antibiotic choices.   9:37 PM Covid test came back positive.  Patient is made aware.  Appreciate consultation from [facility] hospitalist, Dr. Nevada Crane, who request patient to be transferred over to [facility], ER to ER so that hospitalist can evaluate and determine appropriate level of care.  I have also ordered remdesivir and Solu-Medrol and patient agrees with plan.  At this  time he is stable to be transferred to [facility].  I have notified ER attending, Dr. Ashok Cordia who accepted patient.  This patient presents to the ED for concern of cough, this involves an extensive number of treatment options, and is a complaint that carries with it a high risk of complications and morbidity.  The differential diagnosis includes pneumonia, pneumothorax, ILD, bronchiectasis, PE, PTX  Co morbidities that complicate the patient evaluation ILD Additional history obtained:  Additional history obtained from wife External records from outside source obtained and reviewed including EMR along with prior labs and imaging  Lab Tests:  I Ordered, and personally interpreted labs.  The pertinent results include:  as above  Imaging Studies ordered:  I ordered imaging studies including chest CTA I independently visualized and interpreted imaging which showed no PE, but pneumomediastinum noted, possible pneumonia I agree with the radiologist interpretation  Cardiac Monitoring:  The patient was maintained on a cardiac monitor.  I personally viewed and interpreted the cardiac monitored which showed an underlying rhythm of: sinus tachycardia  Medicines ordered and prescription drug management:  I ordered medication including rocephin/zithromax  for pna Reevaluation of the patient after these medicines showed that the patient improved I have reviewed the patients home medicines and have made adjustments as needed  Test Considered: as above  Critical Interventions: sepsis protocol    Consultations Obtained:  I  requested consultation with the pulmonologist Dr. Elsworth Soho,  and discussed lab and imaging findings as well as pertinent plan - they recommend: admission, repeat CXR tomorrow, management of cough  Problem List / ED Course: sob  Cough  Pna  pneumomediastinum  Reevaluation:  After the interventions noted above, I reevaluated the patient and found that they have :improved  Social Determinants of Health: tobacco use  Dispostion:  After consideration of the diagnostic results and the patients response to treatment, I feel that the patent would benefit from admission.          Final Clinical Impression(s) / ED Diagnoses Final diagnoses:  Acute hypoxemic respiratory failure due to COVID-19 [facility])  Interstitial lung disease ([place])  Pneumomediastinum [facility])    Rx / DC Orders ED Discharge Orders     None         Domenic Moras, PA-C 01/18/22 2235    Lennice Sites, DO 01/18/22 2257

## 2022-01-19 ENCOUNTER — Other Ambulatory Visit: Payer: Self-pay

## 2022-01-19 ENCOUNTER — Encounter (HOSPITAL_COMMUNITY): Payer: Self-pay | Admitting: Internal Medicine

## 2022-01-19 ENCOUNTER — Inpatient Hospital Stay (HOSPITAL_COMMUNITY): Payer: Medicare Other

## 2022-01-19 DIAGNOSIS — Z6826 Body mass index (BMI) 26.0-26.9, adult: Secondary | ICD-10-CM | POA: Diagnosis not present

## 2022-01-19 DIAGNOSIS — I5032 Chronic diastolic (congestive) heart failure: Secondary | ICD-10-CM | POA: Diagnosis present

## 2022-01-19 DIAGNOSIS — E440 Moderate protein-calorie malnutrition: Secondary | ICD-10-CM | POA: Diagnosis present

## 2022-01-19 DIAGNOSIS — J9621 Acute and chronic respiratory failure with hypoxia: Secondary | ICD-10-CM | POA: Diagnosis present

## 2022-01-19 DIAGNOSIS — J9601 Acute respiratory failure with hypoxia: Secondary | ICD-10-CM | POA: Diagnosis present

## 2022-01-19 DIAGNOSIS — Z7982 Long term (current) use of aspirin: Secondary | ICD-10-CM | POA: Diagnosis not present

## 2022-01-19 DIAGNOSIS — Z8249 Family history of ischemic heart disease and other diseases of the circulatory system: Secondary | ICD-10-CM | POA: Diagnosis not present

## 2022-01-19 DIAGNOSIS — U071 COVID-19: Secondary | ICD-10-CM

## 2022-01-19 DIAGNOSIS — Z79890 Hormone replacement therapy (postmenopausal): Secondary | ICD-10-CM | POA: Diagnosis not present

## 2022-01-19 DIAGNOSIS — G9332 Myalgic encephalomyelitis/chronic fatigue syndrome: Secondary | ICD-10-CM | POA: Diagnosis present

## 2022-01-19 DIAGNOSIS — Z87891 Personal history of nicotine dependence: Secondary | ICD-10-CM | POA: Diagnosis not present

## 2022-01-19 DIAGNOSIS — Z83438 Family history of other disorder of lipoprotein metabolism and other lipidemia: Secondary | ICD-10-CM | POA: Diagnosis not present

## 2022-01-19 DIAGNOSIS — J1282 Pneumonia due to coronavirus disease 2019: Secondary | ICD-10-CM | POA: Diagnosis present

## 2022-01-19 DIAGNOSIS — R7989 Other specified abnormal findings of blood chemistry: Secondary | ICD-10-CM | POA: Diagnosis not present

## 2022-01-19 DIAGNOSIS — E785 Hyperlipidemia, unspecified: Secondary | ICD-10-CM | POA: Diagnosis present

## 2022-01-19 DIAGNOSIS — J982 Interstitial emphysema: Secondary | ICD-10-CM | POA: Diagnosis present

## 2022-01-19 DIAGNOSIS — R6520 Severe sepsis without septic shock: Secondary | ICD-10-CM | POA: Diagnosis present

## 2022-01-19 DIAGNOSIS — K219 Gastro-esophageal reflux disease without esophagitis: Secondary | ICD-10-CM | POA: Diagnosis present

## 2022-01-19 DIAGNOSIS — Z7951 Long term (current) use of inhaled steroids: Secondary | ICD-10-CM | POA: Diagnosis not present

## 2022-01-19 DIAGNOSIS — A419 Sepsis, unspecified organism: Secondary | ICD-10-CM | POA: Diagnosis present

## 2022-01-19 DIAGNOSIS — R0600 Dyspnea, unspecified: Secondary | ICD-10-CM | POA: Diagnosis not present

## 2022-01-19 DIAGNOSIS — M0510 Rheumatoid lung disease with rheumatoid arthritis of unspecified site: Secondary | ICD-10-CM | POA: Diagnosis present

## 2022-01-19 DIAGNOSIS — A4189 Other specified sepsis: Secondary | ICD-10-CM | POA: Diagnosis present

## 2022-01-19 DIAGNOSIS — Z79899 Other long term (current) drug therapy: Secondary | ICD-10-CM | POA: Diagnosis not present

## 2022-01-19 DIAGNOSIS — I700 Atherosclerosis of aorta: Secondary | ICD-10-CM | POA: Diagnosis present

## 2022-01-19 HISTORY — DX: Chronic diastolic (congestive) heart failure: I50.32

## 2022-01-19 HISTORY — DX: Gastro-esophageal reflux disease without esophagitis: K21.9

## 2022-01-19 LAB — BRAIN NATRIURETIC PEPTIDE: B Natriuretic Peptide: 28.2 pg/mL (ref 0.0–100.0)

## 2022-01-19 LAB — BLOOD GAS, VENOUS
Acid-Base Excess: 5.5 mmol/L — ABNORMAL HIGH (ref 0.0–2.0)
Bicarbonate: 28.9 mmol/L — ABNORMAL HIGH (ref 20.0–28.0)
O2 Saturation: 99.2 %
Patient temperature: 37
pCO2, Ven: 37 mmHg — ABNORMAL LOW (ref 44–60)
pH, Ven: 7.5 — ABNORMAL HIGH (ref 7.25–7.43)
pO2, Ven: 165 mmHg — ABNORMAL HIGH (ref 32–45)

## 2022-01-19 LAB — D-DIMER, QUANTITATIVE: D-Dimer, Quant: 13.34 ug/mL-FEU — ABNORMAL HIGH (ref 0.00–0.50)

## 2022-01-19 LAB — COMPREHENSIVE METABOLIC PANEL
ALT: 18 U/L (ref 0–44)
AST: 34 U/L (ref 15–41)
Albumin: 2.4 g/dL — ABNORMAL LOW (ref 3.5–5.0)
Alkaline Phosphatase: 46 U/L (ref 38–126)
Anion gap: 12 (ref 5–15)
BUN: 9 mg/dL (ref 8–23)
CO2: 25 mmol/L (ref 22–32)
Calcium: 8.3 mg/dL — ABNORMAL LOW (ref 8.9–10.3)
Chloride: 103 mmol/L (ref 98–111)
Creatinine, Ser: 0.92 mg/dL (ref 0.61–1.24)
GFR, Estimated: >60 mL/min (ref >60)
Glucose, Bld: 125 mg/dL — ABNORMAL HIGH (ref 70–99)
Potassium: 4.3 mmol/L (ref 3.5–5.1)
Sodium: 140 mmol/L (ref 135–145)
Total Bilirubin: 0.6 mg/dL (ref 0.3–1.2)
Total Protein: 6.4 g/dL — ABNORMAL LOW (ref 6.5–8.1)

## 2022-01-19 LAB — LACTATE DEHYDROGENASE: LDH: 304 U/L — ABNORMAL HIGH (ref 98–192)

## 2022-01-19 LAB — CBC WITH DIFFERENTIAL/PLATELET
Abs Immature Granulocytes: 0.04 10*3/uL (ref 0.00–0.07)
Basophils Absolute: 0 10*3/uL (ref 0.0–0.1)
Basophils Relative: 0 %
Eosinophils Absolute: 0 10*3/uL (ref 0.0–0.5)
Eosinophils Relative: 0 %
HCT: 35.4 % — ABNORMAL LOW (ref 39.0–52.0)
Hemoglobin: 11.3 g/dL — ABNORMAL LOW (ref 13.0–17.0)
Immature Granulocytes: 1 %
Lymphocytes Relative: 5 %
Lymphs Abs: 0.4 10*3/uL — ABNORMAL LOW (ref 0.7–4.0)
MCH: 26.9 pg (ref 26.0–34.0)
MCHC: 31.9 g/dL (ref 30.0–36.0)
MCV: 84.3 fL (ref 80.0–100.0)
Monocytes Absolute: 0.1 10*3/uL (ref 0.1–1.0)
Monocytes Relative: 2 %
Neutro Abs: 6.4 10*3/uL (ref 1.7–7.7)
Neutrophils Relative %: 92 %
Platelets: 205 10*3/uL (ref 150–400)
RBC: 4.2 MIL/uL — ABNORMAL LOW (ref 4.22–5.81)
RDW: 13.6 % (ref 11.5–15.5)
WBC: 7 10*3/uL (ref 4.0–10.5)
nRBC: 0 % (ref 0.0–0.2)

## 2022-01-19 LAB — APTT: aPTT: 31 seconds (ref 24–36)

## 2022-01-19 LAB — PROCALCITONIN: Procalcitonin: 1.03 ng/mL

## 2022-01-19 LAB — MAGNESIUM: Magnesium: 1.8 mg/dL (ref 1.7–2.4)

## 2022-01-19 LAB — PROTIME-INR
INR: 1.1 (ref 0.8–1.2)
Prothrombin Time: 14.4 seconds (ref 11.4–15.2)

## 2022-01-19 LAB — C-REACTIVE PROTEIN: CRP: 17.3 mg/dL — ABNORMAL HIGH (ref <1.0)

## 2022-01-19 LAB — PHOSPHORUS: Phosphorus: 1.9 mg/dL — ABNORMAL LOW (ref 2.5–4.6)

## 2022-01-19 LAB — MRSA NEXT GEN BY PCR, NASAL: MRSA by PCR Next Gen: NOT DETECTED

## 2022-01-19 MED ORDER — DEXTROSE 5 % IV SOLN
30.0000 mmol | Freq: Once | INTRAVENOUS | Status: AC
  Administered 2022-01-19: 30 mmol via INTRAVENOUS
  Filled 2022-01-19: qty 10

## 2022-01-19 MED ORDER — METHYLPREDNISOLONE SODIUM SUCC 125 MG IJ SOLR
80.0000 mg | Freq: Two times a day (BID) | INTRAMUSCULAR | Status: DC
  Administered 2022-01-19 (×2): 80 mg via INTRAVENOUS
  Filled 2022-01-19 (×2): qty 2

## 2022-01-19 MED ORDER — PROSOURCE PLUS PO LIQD
30.0000 mL | Freq: Two times a day (BID) | ORAL | Status: DC
  Administered 2022-01-20 – 2022-01-21 (×3): 30 mL via ORAL
  Filled 2022-01-19 (×4): qty 30

## 2022-01-19 MED ORDER — ALBUTEROL SULFATE (2.5 MG/3ML) 0.083% IN NEBU: 2.5000 mg | INHALATION_SOLUTION | RESPIRATORY_TRACT | Status: DC | PRN

## 2022-01-19 MED ORDER — ENSURE ENLIVE PO LIQD
237.0000 mL | Freq: Two times a day (BID) | ORAL | Status: DC
  Administered 2022-01-20 – 2022-01-21 (×3): 237 mL via ORAL

## 2022-01-19 MED ORDER — ACETAMINOPHEN 650 MG RE SUPP: 650.0000 mg | Freq: Four times a day (QID) | RECTAL | Status: DC | PRN

## 2022-01-19 MED ORDER — FAMOTIDINE 20 MG PO TABS
20.0000 mg | ORAL_TABLET | Freq: Two times a day (BID) | ORAL | Status: DC
  Administered 2022-01-19 – 2022-01-21 (×5): 20 mg via ORAL
  Filled 2022-01-19 (×5): qty 1

## 2022-01-19 MED ORDER — GUAIFENESIN-DM 100-10 MG/5ML PO SYRP
5.0000 mL | ORAL_SOLUTION | ORAL | Status: DC | PRN
  Administered 2022-01-19: 5 mL via ORAL
  Filled 2022-01-19: qty 5

## 2022-01-19 MED ORDER — IPRATROPIUM-ALBUTEROL 0.5-2.5 (3) MG/3ML IN SOLN
3.0000 mL | Freq: Four times a day (QID) | RESPIRATORY_TRACT | Status: DC
  Administered 2022-01-19 (×3): 3 mL via RESPIRATORY_TRACT
  Filled 2022-01-19 (×3): qty 3

## 2022-01-19 MED ORDER — ARFORMOTEROL TARTRATE 15 MCG/2ML IN NEBU
15.0000 ug | INHALATION_SOLUTION | Freq: Two times a day (BID) | RESPIRATORY_TRACT | Status: DC
  Administered 2022-01-19 – 2022-01-21 (×5): 15 ug via RESPIRATORY_TRACT
  Filled 2022-01-19 (×5): qty 2

## 2022-01-19 MED ORDER — ASPIRIN 81 MG PO TBEC
81.0000 mg | DELAYED_RELEASE_TABLET | Freq: Every day | ORAL | Status: DC
  Administered 2022-01-19 – 2022-01-21 (×3): 81 mg via ORAL
  Filled 2022-01-19 (×3): qty 1

## 2022-01-19 MED ORDER — LORATADINE 10 MG PO TABS
10.0000 mg | ORAL_TABLET | Freq: Every day | ORAL | Status: DC
  Administered 2022-01-19 – 2022-01-21 (×3): 10 mg via ORAL
  Filled 2022-01-19 (×3): qty 1

## 2022-01-19 MED ORDER — ENOXAPARIN SODIUM 40 MG/0.4ML IJ SOSY
40.0000 mg | PREFILLED_SYRINGE | INTRAMUSCULAR | Status: DC
  Administered 2022-01-19: 40 mg via SUBCUTANEOUS
  Filled 2022-01-19: qty 0.4

## 2022-01-19 MED ORDER — ACETAMINOPHEN 325 MG PO TABS: 650.0000 mg | ORAL_TABLET | Freq: Four times a day (QID) | ORAL | Status: DC | PRN

## 2022-01-19 NOTE — ED Notes (Addendum)
Pt bib [facility] from [place]. SOB for one week, covid+, productive cough. Ct revealed moderate pneumomediastinum. Hx interstitial lung disease, 3L [place] at baseline, currently at 4. Bilateral 20G LAC + R FA, LR running at 150. Axo x4, GCS 15.

## 2022-01-19 NOTE — ED Notes (Signed)
Pt RR 30-40, no acute distress noted, resting comfortably. Howerter MD made aware

## 2022-01-19 NOTE — H&P (Signed)
History and Physical    PLEASE NOTE THAT DRAGON DICTATION SOFTWARE WAS USED IN THE CONSTRUCTION OF THIS NOTE.   Earl Thomas TOI:[id number] DOB: [birth year] DOA: 01/18/2022  PCP: Horald Pollen, MD  Patient coming from: home   I have personally briefly reviewed patient's old medical records in [place]  Chief Complaint: Shortness of breath  HPI: Earl Thomas is a 71 y.o. male with medical history significant for chronic hypoxic respiratory failure on 3 L continuous nasal cannula, interstitial lung disease, rheumatoid arthritis, chronic diastolic heart failure, who is admitted to [facility] on 01/18/2022 by way of transfer from [place] ED with acute on chronic hypoxic respiratory failure in the setting of severe COVID-19 infection after presenting from home to the latter complaining of shortness of breath.   The following history is obtained via my discussions with the patient as well as my discussions with the patient's wife, who is present at bedside, in addition to chart review.  The patient has been experiencing 1 week of progressive shortness of breath associated with worsening of his baseline nonproductive cough, noting that his cough has become more frequent and productive over this timeframe.  He is also noted intermittent fever, reporting temperature max of 102 over the last 24 hours, prompting his use of acetaminophen at home.  He also notes chills, in the absence of full body rigors or generalized myalgias.  This is also been associated with rhinitis, rhinorrhea, and new onset loose stool, reporting an average of 1 such episode of loose stool per day over the last 5 to 6 days, in the absence of any associated melena or hematochezia.  Denies any associated abdominal pain.  He notes that he and his wife were recently vacationing in [place], and that the above symptoms started while he was still in [place].  Wife is experienced a similar  constellation of symptoms, but notes that her symptoms have significantly improved while the patient's similar symptoms, most notably shortness of breath has worsened in the interval.  Not associate with any chest pain, palpitations, diaphoresis, dizziness, presyncope, or syncope.  No rash or dysuria.  He also denies any associated orthopnea, PND, or worsening of peripheral edema.  The patient notes that he has taken a few home COVID-19 tests since returning home from [place], and conveys that each of these tests have been negative.  No recent trauma.  He confirms a history of chronic hypoxic respiratory failure on 3 L continuous nasal cannula in the setting of interstitial lung disease related to his rheumatoid arthritis.  Is on weekly methotrexate.   Also has a history of chronic diastolic heart failure, with most recent echocardiogram in February 2023 notable for LVEF 60 to 65%, no focal wall motion maladies, grade 1 diastolic dysfunction, normal right ventricular systolic function and mild mitral regurgitation.  He underwent Lexiscan stress test with Myoview to fusion on 01/14/2022, which showed no evidence of reversible ischemia.     [facility] ED Course:  Vital signs in the ED were notable for the following: Afebrile; initial heart rate 115, which decreased to 95 following interval IV fluids; initial blood pressure 99/57, which increased to 114/70 following IV fluids; respiratory rate 25-41; initial oxygen saturation 89% on baseline 3 L nasal cannula, with ensuing improvement into the range of 96 to 99% on 5 L nasal cannula.  Labs were notable for the following: BMP notable for the following: Bicarbonate 27, creatinine 1.03.  Lactic  acid 1.9.  Will blood cell count 10,000 with 81% neutrophils.  INR 1.1.  Urinalysis notable for no white blood cells.  Blood cultures x2 collected prior to initiation of IV antibiotics.  COVID-19 PCR positive will influenza PCR negative.  Imaging and  additional notable ED work-up: EKG showed sinus tachycardia with heart rate 108, normal will's, no evidence of T wave or ST changes, including no evidence of ST elevation.  Chest x-ray showed severe chronic lung disease/fibrosis, will demonstrating no evidence of overtly acute cardiopulmonary process.  CTA chest with PE protocol, in comparison to CTA chest performed in May 2023, showed no evidence of acute pulmonary embolism, while showing moderate pneumomediastinum without any evidence of pneumothorax, also showing interval development of focal consolidation in the posterior right lower lobe, potentially representing superimposed acute infection, also demonstrating right basilar infiltrate, in the absence of any evident's of edema or pleural effusion.  EDP discussed the patient's case, including imaging with the on-call PCCM physician, Dr. Elsworth Soho, Who recommended admission to the hospitalist service at [facility], while also recommending repeat chest x-ray in the morning to evaluate for any interval development of pneumothorax in the context of pneumomediastinum that was observed on CTA chest.  PCCM available, as needed, for additional consultation.  While in the ED, the following were administered: Solumedrol 40 mg IV, azithromycin, Rocephin, remdesivir, lactated Ringer's x1.5 L bolus followed by continuous LR at 150 cc/h.  Subsequently, the patient was admitted for further evaluation and management of acute on chronic hypoxic respiratory failure in the setting of severe COVID-19 infection, with presentation also notable for pneumomediastinum as well as severe sepsis, with potential for superimposed bacterial infection.     Review of Systems: As per HPI otherwise 10 point review of systems negative.   Past Medical History:  Diagnosis Date   Abnormal weight loss 12/04/2021   Acute interstitial pneumonitis ([place]) 12/04/2021   Aortic atherosclerosis ([place]) 12/04/2021   Arthralgia of temporomandibular  joint 12/04/2021   Arthritis    BMI 26.0-26.9,adult 10/22/2016   Chronic fatigue syndrome 12/04/2021   Dyslipidemia 03/10/2012   Family history of prostate cancer 12/04/2021   Glucose intolerance (impaired glucose tolerance)    Hyperlipidemia    Interstitial lung disease ([place]) 07/16/2019   Long-term current use of high risk medication other than anticoagulant 07/16/2019   Other long term (current) drug therapy 12/04/2021   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   Renal insufficiency 04/29/2016   Rheumatoid lung disease with rheumatoid arthritis ([place]) 07/16/2019   Skin lesion of chest wall 12/04/2021    Past Surgical History:  Procedure Laterality Date   ROTATOR CUFF REPAIR Right 01/02/16    Social History:  reports that he quit smoking about 33 years ago. His smoking use included pipe. He has never used smokeless tobacco. He reports that he does not drink alcohol and does not use drugs.   No Known Allergies  Family History  Problem Relation Age of Onset   Cancer Father 30       prostate cancer   Diabetes Father    Heart disease Father 32       AMI late 36s   Cancer Mother 87       Breast cancer   Heart disease Mother        CABG at age 61   Diabetes Brother    Heart disease Brother        AMI x 2; CABG  Hyperlipidemia Brother     Family history reviewed and not pertinent    Prior to Admission medications   Medication Sig Start Date End Date Taking? Authorizing Provider  albuterol (VENTOLIN HFA) 108 (90 Base) MCG/ACT inhaler Inhale 2 puffs into the lungs every 6 (six) hours as needed for wheezing or shortness of breath. 07/19/21   Spero Geralds, MD  aspirin EC 81 MG tablet Take 81 mg by mouth daily.    [provider]  azelastine (ASTELIN) 0.1 % nasal spray Place 2 sprays into both nostrils 2 (two) times daily. Use in each nostril as directed 12/25/21   Roney Marion, MD  cetirizine (ZYRTEC)  10 MG tablet Take 10 mg by mouth daily.    [provider]  diclofenac sodium (VOLTAREN) 1 % GEL Apply 2 g topically 4 (four) times daily. 09/18/18   Forrest Moron, MD  famotidine (PEPCID) 20 MG tablet Take 1 tablet (20 mg total) by mouth 2 (two) times daily. 12/25/21   Roney Marion, MD  fluticasone (FLONASE) 50 MCG/ACT nasal spray Place 1 spray into both nostrils daily. 08/22/20   Spero Geralds, MD  folic acid ([name]) 1 MG tablet Take 1 mg by mouth daily. 08/27/19   [provider]  ipratropium (ATROVENT) 0.06 % nasal spray Place 1 spray into both nostrils 3 (three) times daily. 11/13/20   Spero Geralds, MD  levocetirizine (XYZAL) 5 MG tablet Take 1 tablet (5 mg total) by mouth every evening. 12/25/21   Roney Marion, MD  loperamide (IMODIUM) 2 MG capsule Take 2 mg by mouth as needed for diarrhea or loose stools.    [provider]  meloxicam (MOBIC) 7.5 MG tablet Take 1 tablet (7.5 mg total) by mouth daily as needed for pain. 01/16/22   Spero Geralds, MD  methotrexate (RHEUMATREX) 10 MG tablet Take 1 tablet (10 mg total) by mouth once a week. Caution: Chemotherapy. Protect from light. 01/16/22   Spero Geralds, MD  nitroGLYCERIN (NITROSTAT) 0.4 MG SL tablet Place 1 tablet (0.4 mg total) under the tongue every 5 (five) minutes as needed. 12/27/21 03/27/22  Revankar, Reita Cliche, MD  riTUXimab (RITUXAN IV) Inject into the vein.    [provider]  SYMBICORT 80-4.5 MCG/ACT inhaler Inhale 2 puffs by mouth twice daily 11/28/21   Horald Pollen, MD     Objective    Physical Exam: Vitals:   01/19/22 0230 01/19/22 0245 01/19/22 0300 01/19/22 0315  BP: 114/70 115/67 112/72 114/69  Pulse: 96 96 93 94  Resp: (!) 34 (!) 31 (!) 34 (!) 35  Temp:      TempSrc:      SpO2: 99% 99% 99% 100%    General: appears to be stated age; alert, oriented; increased wob noted;  Skin: warm, dry, no rash Head:  AT/[place] Mouth:  Oral mucosa membranes appear moist, normal  dentition Neck: supple; trachea midline Heart:  RRR; did not appreciate any M/R/G Lungs: CTAB, did not appreciate any wheezes, rales, or rhonchi Abdomen: + BS; soft, ND, NT Vascular: 2+ pedal pulses b/l; 2+ radial pulses b/l Extremities: no peripheral edema, no muscle wasting Neuro: strength and sensation intact in upper and lower extremities b/l    Labs on Admission: I have personally reviewed following labs and imaging studies  CBC: Recent Labs  Lab 01/18/22 1855 01/19/22 0307  WBC 10.0 7.0  NEUTROABS 8.1* 6.4  HGB 12.3* 11.3*  HCT 38.8* 35.4*  MCV 85.8 84.3  PLT 211  465   Basic Metabolic Panel: Recent Labs  Lab 01/18/22 1855  NA 135  K 3.8  CL 101  CO2 27  GLUCOSE 123*  BUN 16  CREATININE 1.03  CALCIUM 8.4*   GFR: Estimated Creatinine Clearance: 45.5 mL/min (by C-G formula based on SCr of 1.03 mg/dL). Liver Function Tests: No results for input(s): "AST", "ALT", "ALKPHOS", "BILITOT", "PROT", "ALBUMIN" in the last 168 hours. No results for input(s): "LIPASE", "AMYLASE" in the last 168 hours. No results for input(s): "AMMONIA" in the last 168 hours. Coagulation Profile: Recent Labs  Lab 01/19/22 0052  INR 1.1   Cardiac Enzymes: No results for input(s): "CKTOTAL", "CKMB", "CKMBINDEX", "TROPONINI" in the last 168 hours. BNP (last 3 results) No results for input(s): "PROBNP" in the last 8760 hours. HbA1C: No results for input(s): "HGBA1C" in the last 72 hours. CBG: No results for input(s): "GLUCAP" in the last 168 hours. Lipid Profile: No results for input(s): "CHOL", "HDL", "LDLCALC", "TRIG", "CHOLHDL", "LDLDIRECT" in the last 72 hours. Thyroid Function Tests: No results for input(s): "TSH", "T4TOTAL", "FREET4", "T3FREE", "THYROIDAB" in the last 72 hours. Anemia Panel: No results for input(s): "VITAMINB12", "FOLATE", "FERRITIN", "TIBC", "IRON", "RETICCTPCT" in the last 72 hours. Urine analysis:    Component Value Date/Time   COLORURINE YELLOW  01/18/2022 2033   APPEARANCEUR CLEAR 01/18/2022 2033   LABSPEC 1.010 01/18/2022 2033   PHURINE 6.5 01/18/2022 2033   GLUCOSEU NEGATIVE 01/18/2022 2033   HGBUR NEGATIVE 01/18/2022 2033   BILIRUBINUR NEGATIVE 01/18/2022 2033   BILIRUBINUR negative 05/14/2017 1557   BILIRUBINUR Negative 02/22/2015 1434   KETONESUR NEGATIVE 01/18/2022 2033   PROTEINUR NEGATIVE 01/18/2022 2033   UROBILINOGEN 0.2 05/14/2017 1557   NITRITE NEGATIVE 01/18/2022 2033   LEUKOCYTESUR NEGATIVE 01/18/2022 2033    Radiological Exams on Admission: CT Angio Chest PE W and/or Wo Contrast  Result Date: 01/18/2022 CLINICAL DATA:  Pulmonary embolism (PE) suspected, high prob EXAM: CT ANGIOGRAPHY CHEST WITH CONTRAST TECHNIQUE: Multidetector CT imaging of the chest was performed using the standard protocol during bolus administration of intravenous contrast. Multiplanar CT image reconstructions and MIPs were obtained to evaluate the vascular anatomy. RADIATION DOSE REDUCTION: This exam was performed according to the departmental dose-optimization program which includes automated exposure control, adjustment of the mA and/or kV according to patient size and/or use of iterative reconstruction technique. CONTRAST:  66m OMNIPAQUE IOHEXOL 350 MG/ML SOLN COMPARISON:  11/10/2021 FINDINGS: Cardiovascular: There is adequate opacification of the pulmonary arterial tree. No intraluminal filling defect identified to suggest acute pulmonary embolism. No significant coronary artery calcification. Global cardiac size within normal limits. No pericardial effusion. Central pulmonary arteries are of normal caliber. The thoracic aorta is unremarkable. Mediastinum/Nodes: There has developed moderate pneumomediastinum within the anterior mediastinum best appreciated substernally. Gas is seen tracking into the aortopulmonary groove as well as the right neck base. Visualized thyroid is unremarkable. Enhancing soft tissue within the subcarinal and bilateral  hilar regions may represent reactive adenopathy. No pathologically enlarged thoracic adenopathy. Esophagus is unremarkable. Small hiatal hernia. Lungs/Pleura: There is extensive subpleural architectural distortion with honeycombing demonstrating a basilar predominance with associated traction bronchiectasis in keeping with a UIP pattern of interstitial lung disease. There has developed mild focal consolidation within the posterior basal right lower lobe which may represent superimposed acute infection in the appropriate clinical setting. More sparse infiltrate is seen diffusely throughout the right lung base, best appreciated on axial image # 58/7. No central obstructing lesion. No pneumothorax or pleural effusion. Upper Abdomen: No acute abnormality. Musculoskeletal:  No acute bone abnormality. No lytic or blastic bone lesion. Review of the MIP images confirms the above findings. IMPRESSION: 1. No pulmonary embolism. 2. Interval development of moderate pneumomediastinum. No pneumothorax. 3. Interval development of focal consolidation within the posterior basal right lower lobe which may represent superimposed acute infection in the appropriate clinical setting. More sparse infiltrate is seen diffusely throughout the right lung base. 4. UIP pattern of interstitial lung disease, grossly stable since prior examination. Findings are consistent with UIP per consensus guidelines: Diagnosis of Idiopathic Pulmonary Fibrosis: An Official ATS/ERS/JRS/ALAT Clinical Practice Guideline. [place], Iss 5, [phone], Mar 08 2017. Electronically Signed   By: Fidela Salisbury M.D.   On: 01/18/2022 20:38   DG Chest Portable 1 View  Result Date: 01/18/2022 CLINICAL DATA:  Cough EXAM: PORTABLE CHEST 1 VIEW COMPARISON:  11/10/2021 FINDINGS: Severe chronic interstitial disease throughout the lungs compatible with severe chronic fibrosis as seen on prior plain films and CT. No definite acute areas of consolidation  or effusions. Heart is normal size. No acute bony abnormality. IMPRESSION: Severe chronic lung disease/fibrosis.  No definite acute process. Electronically Signed   By: Rolm Baptise M.D.   On: 01/18/2022 18:57     EKG: Independently reviewed, with result as described above.    Assessment/Plan   Principal Problem:   Acute on chronic respiratory failure with hypoxia (HCC) Active Problems:   COVID-19 virus infection   Severe sepsis (HCC)   Pneumomediastinum (HCC)   GERD (gastroesophageal reflux disease)   Chronic diastolic CHF (congestive heart failure) ([place])       #) Acute on chronic hypoxic respiratory failure: In the context of baseline supplemental oxygen requirement of 3 L continuous nasal cannula in the setting of interstitial lung disease, patient initially noted to be hypoxic on his baseline degree of supplemental O2, with ensuing increase in O2 sats to the mid to high 90s on 5 L nasal cannula, all in the context of 1 week of progressive shortness of breath with associated symptoms as above.  Suspect that this is multifactorial in nature, with contribution from severe COVID-19 infection per positive for COVID-19 PCR result earlier today likely resulting in exacerbation of his interstitial lung disease, with concern also for the possibility of a superimposed/secondary bacterial pneumonia given radiographic findings identified on CTA chest, as above, with increased risk for development of such in the setting of chronic immunosuppressive therapy via methotrexate.  His pneumomediastinum is noted, without associated evidence of pneumothorax.  Per discussions with PCCM, as further detailed above, will repeat chest x-ray in the morning to further monitor for ensuing evidence of pneumothorax.  Presentation appears less suggestive of acute decompensated heart failure, although he is at risk for this in the setting of a documented history of chronic diastolic heart failure with interval IV fluids.   ACS less likely, in the absence of any recent chest pain, We will EKG shows no evidence of acute ischemic changes, will also noting recent Lexiscan stress test with Myoview that showed no evidence of reversible ischemia.   Plan: will continue solumedrol, but increase dose to 80 mg IV twice daily.  Monitor continuous pulse oximetry.  Scheduled duo nebulizers, prn albuterol nebulizer.  Further evaluation management of severe COVID-19 infection, including remdesivir.  Add on serum magnesium and phosphorus levels.  Blood gas in the morning.  Repeat chest x-ray in the morning, as above.  Add on procalcitonin level.  Add on BNP.  Flutter valve, incentive spirometry.  For now we will also continue azithromycin and Rocephin, pending results of procalcitonin level.  Monitor on telemetry.          #) Severe sepsis: SIRS criteria met via objective fever, tachycardia, tachypnea.  Lactic acid level 1.9.  Of note, given the associated presence of suspected end organ damage in the form of concominant presenting acute on chronic hypoxic respiratory failure, criteria are met for pt's sepsis to be considered severe in nature.  Relative to the patient's most recent recorded body weight, he received 30 mL/kg [facility] this evening.   There is potential for multifactorial infectious sources, with confirmed COVID-19 infection per positive PCR.  Suspicion for secondary bacterial infection, as noted above, will also noting neutrophilic predominance on presenting CBC with differential.  Checking procalcitonin to further assess, while continuing azithromycin and Rocephin for now.  May consider expanding spectrum of antibiotic coverage if ensuing clinical worsening, particular given the patient's chronically immunosuppressed state on methotrexate.   Additional ED work-up/management notable for: Collection of blood cultures x2 followed by initiation of Rocephin, azithromycin.  Of note, UA was not  consistent with UTI.    Plan: CBC w/ diff and CMP in AM.  Follow for results of blood cx's x 2. Abx: Continue azithromycin and Rocephin for now.  Add on procalcitonin level.  Further evaluation management of severe COVID-19 infection, as further detailed below.  Strep pneumoniae urine antigen as well as Legionella urine antigen, particular given concomitant GI symptoms.  Will reduce rate of continuous LR from 150 to 75 cc/h given appearance of hemodynamic stability in the context of a documented history of chronic diastolic heart failure.         #) Severe COVID-19 infection: In the setting of progressive acute respiratory symptoms, as noted above, COVID-19 PCR will be positive earlier this evening.  Meets criteria to be considered severe in nature given concomitant acute on chronic hypoxic respiratory failure, as above.  In this setting, will continue systemic corticosteroids in the form of solumedrol.  Additionally, I given the patient's increased risk for further progression of severity of his COVID-19 infection as a result of his age and multiple comorbidities, remdesivir was also started at [facility] earlier this evening.  Of note, no history of underlying diabetes.  Plan: Solumedrol.  Continue remdesivir.  Check CRP.  Add on procalcitonin level.  Airborne precautions ordered.  Flutter valve, incentive spirometry.  Scheduled/as needed breathing treatments, as above.  Repeat CBC with differential in the morning.  Add on serum magnesium and phosphorus levels.        #) Pneumomediastinum: Identified on CTA chest, without evidence of pneumothorax.  Per discussions with on-call PCCM, there is recommendation for repeat chest x-ray in the morning to further evaluate for any interval development of pneumothorax, as further detailed above.  No additional recommendation for CT with oral contrast for these discussions.  Plan: Repeat chest x-ray ordered for later this morning, as above.   Monitor continuous pulse oximetry.            #) Chronic diastolic heart failure: documented history of such, with most recent echocardiogram performed in February 2023, notable for LVEF 60 to 65% as well as grade 1 diastolic dysfunction, as further detailed above. No clinical or radiographic evidence to suggest acutely decompensated heart failure at this time. home diuretic regimen reportedly consists of the following: None.  Given appearance of hemodynamic stability nonelevated lactate, baseline renal function, will reduce  rate to continuous IV fluids at this time, as quantified below.   Plan: monitor strict I's & O's and daily weights. Repeat BMP in AM. Check serum mag level.  Decrease continuous LR from 150 cc/h to 75 cc/h.  Add on BNP.  Monitor continuous pulse oximetry.          #) GERD: documented h/o such; on Pepcid as outpatient.   Plan: continue home Pepcid.         DVT prophylaxis: SCD's   Code Status: Full code Family Communication: I discussed the patient's case with his wife, who is present at bedside Disposition Plan: Per Rounding Team Consults called: [place] discussed patient's case with on-call PCCM, Dr. Elsworth Soho, As further detailed above.;  Admission status: Inpatient    PLEASE NOTE THAT DRAGON DICTATION SOFTWARE WAS USED IN THE CONSTRUCTION OF THIS NOTE.   [place] DO [facility]  From [place]   01/19/2022, 3:34 AM

## 2022-01-19 NOTE — Progress Notes (Signed)
VASCULAR LAB    Bilateral lower extremity venous duplex has been performed.  See CV proc for preliminary results.   Dahlton Hinde, RVT 01/19/2022, 3:17 PM

## 2022-01-19 NOTE — Progress Notes (Signed)
PROGRESS NOTE                                                                                                                                                                                                             Patient Demographics:    Earl Thomas, is a 71 y.o. male, DOB - 04/27/1951, UDJ:[id number]  Outpatient Primary MD for the patient is Mitchel Honour, Ines Bloomer, MD    LOS - 0  Admit date - 01/18/2022    Chief Complaint  Patient presents with   Shortness of Breath       Brief Narrative (HPI from H&P)   70 y.o. male with medical history significant for chronic hypoxic respiratory failure on 3 L continuous nasal cannula, interstitial lung disease, rheumatoid arthritis, chronic diastolic heart failure, who was admitted to the hospital with chief complaints of dry cough and shortness of breath, work-up consistent with community-acquired pneumonia, COVID-19 infection likely acute with pneumomediastinum.  Pulmonary was consulted and he was admitted by hospitalist team.   Subjective:    Earl Thomas today has, No headache, No chest pain, No abdominal pain - No Nausea, No new weakness tingling or numbness, dry cough but much improved shortness of breath.   Assessment  & Plan :    Acute on chronic hypoxic respiratory failure in a patient with history of underlying ILD on 3 L nasal cannula oxygen. Work-up in the ER consistent with community-acquired pneumonia along with acute COVID-19 infection with CT count of 21 & pneumomediastinum.  Pulmonary is on board.  He is currently on appropriate IV antibiotics for community-acquired pneumonia along with the steroids for COVID-19 inflammation.  He is also on remdesivir which was started upon admission and will be continued.  Clinically he is improving.    Severe sepsis due to #1 above.  Clinically much improved continue hydration.  Appears nontoxic now.  Chronic diastolic CHF  recent EF 58%.  Compensated.  Moderate to severe protein calorie malnutrition.  Protein supplements.  GERD.  Pepcid  ++Elevated D-dimer.  CTA negative, check leg ultrasound placed on Lovenox for prophylaxis.  Could be from COVID-19 inflammation, monitor on steroids.  Pneumomediastinum.  Defer to pulmonary.  Mildly elevated LDH.  Discussed with pulmonary they will evaluate and monitor.        Condition - Extremely Guarded  Family Communication  :  Wife bedside on 01/19/2022  Code Status : Full code  Consults  : Pulmonary  PUD Prophylaxis : Pepcid   Procedures  :     CTA - 1. No pulmonary embolism. 2. Interval development of moderate pneumomediastinum. No pneumothorax. 3. Interval development of focal consolidation within the posterior basal right lower lobe which may represent superimposed acute infection in the appropriate clinical setting. More sparse infiltrate is seen diffusely throughout the right lung base. 4. UIP pattern of interstitial lung disease, grossly stable since prior examination. Findings are consistent with UIP per consensus guidelines: Diagnosis of Idiopathic Pulmonary Fibrosis  Leg [place] -       Disposition Plan  :    Status is: Inpatient  DVT Prophylaxis  : Lovenox added 01/19/2022  SCDs Start: 01/19/22 0144     Lab Results  Component Value Date   PLT 205 01/19/2022    Diet :  Diet Order             Diet regular Room service appropriate? Yes; Fluid consistency: Thin  Diet effective now                    Inpatient Medications  Scheduled Meds:  aspirin EC  81 mg Oral Daily   famotidine  20 mg Oral BID   ipratropium-albuterol  3 mL Nebulization Q6H   loratadine  10 mg Oral Daily   methylPREDNISolone (SOLU-MEDROL) injection  80 mg Intravenous Q12H   Continuous Infusions:  azithromycin Stopped (01/18/22 2145)   cefTRIAXone (ROCEPHIN)  IV Stopped (01/18/22 1959)   lactated ringers 75 mL/hr at 01/19/22 0853   remdesivir 100 mg in  sodium chloride 0.9 % 100 mL IVPB     sodium phosphate 30 mmol in dextrose 5 % 250 mL infusion 30 mmol (01/19/22 0823)   PRN Meds:.acetaminophen **OR** acetaminophen, albuterol  Time Spent in minutes  30   Lala Lund M.D on 01/19/2022 at 9:38 AM  To page go to [email or web address]   [facility] -  Office  [phone]  See all Orders from today for further details    Objective:   Vitals:   01/19/22 0630 01/19/22 0645 01/19/22 0700 01/19/22 0734  BP: 115/75 108/66 109/68 107/71  Pulse: 88 94 86 89  Resp: (!) 27 (!) 32 (!) 31 (!) 30  Temp:      TempSrc:      SpO2: 100% 100% 100% 100%    Wt Readings from Last 3 Encounters:  01/16/22 46.9 kg  01/14/22 48.1 kg  12/27/21 48.1 kg     Intake/Output Summary (Last 24 hours) at 01/19/2022 0938 Last data filed at 01/19/2022 6438 Gross per 24 hour  Intake 3049.56 ml  Output 400 ml  Net 2649.56 ml     Physical Exam  Awake Alert, No new F.N deficits, Normal affect Mitchell.AT,PERRAL Supple Neck, No JVD,   Symmetrical Chest wall movement, Good air movement bilaterally, CTAB RRR,No Gallops,Rubs or new Murmurs,  +ve B.Sounds, Abd Soft, No tenderness,   No Cyanosis, Clubbing or edema      Data Review:    CBC Recent Labs  Lab 01/18/22 1855 01/19/22 0307  WBC 10.0 7.0  HGB 12.3* 11.3*  HCT 38.8* 35.4*  PLT 211 205  MCV 85.8 84.3  MCH 27.2 26.9  MCHC 31.7 31.9  RDW 13.6 13.6  LYMPHSABS 1.0 0.4*  MONOABS 0.7 0.1  EOSABS 0.2 0.0  BASOSABS 0.0 0.0    Electrolytes Recent Labs  Lab 01/18/22 1855 01/19/22 0052 01/19/22  9038 01/19/22 0450  NA 135  --  140  --   K 3.8  --  4.3  --   CL 101  --  103  --   CO2 27  --  25  --   GLUCOSE 123*  --  125*  --   BUN 16  --  9  --   CREATININE 1.03  --  0.92  --   CALCIUM 8.4*  --  8.3*  --   AST  --   --  34  --   ALT  --   --  18  --   ALKPHOS  --   --  46  --   BILITOT  --   --  0.6  --   ALBUMIN  --   --  2.4*  --   MG  --   --  1.8  --   CRP  --   --   --   17.3*  DDIMER  --  13.34*  --   --   PROCALCITON  --   --  1.03  --   LATICACIDVEN 1.9  --   --   --   INR  --  1.1  --   --   BNP  --   --  28.2  --     ------------------------------------------------------------------------------------------------------------------ No results for input(s): "CHOL", "HDL", "LDLCALC", "TRIG", "CHOLHDL", "LDLDIRECT" in the last 72 hours.  Lab Results  Component Value Date   HGBA1C 5.9 (A) 09/07/2019    ------------------------------------------------------------------------------------------------------------------ ID Labs Recent Labs  Lab 01/18/22 1855 01/19/22 0052 01/19/22 0307 01/19/22 0450  WBC 10.0  --  7.0  --   PLT 211  --  205  --   CRP  --   --   --  17.3*  DDIMER  --  13.34*  --   --   PROCALCITON  --   --  1.03  --   LATICACIDVEN 1.9  --   --   --   CREATININE 1.03  --  0.92  --    Radiology Reports DG Chest Port 1 View  Result Date: 01/19/2022 CLINICAL DATA:  Dyspnea.  [id number] EXAM: PORTABLE CHEST 1 VIEW COMPARISON:  01/18/2022 FINDINGS: Lung volumes are small. Superimposed diffuse coarse interstitial pulmonary infiltrates, more prevalent at the lung bases bilaterally, are again identified in keeping with changes of underlying interstitial lung disease, better assessed on CT examination of 01/18/2022. These appears stable since immediate prior examination, though appear mildly progressed at the lung bases bilaterally since remote prior examination of 11/10/2021. This may reflect superimposed infectious or inflammatory infiltrate, again better appreciated on prior CT examination. No pneumothorax or pleural effusion. Cardiac size within normal limits. No acute bone abnormality. IMPRESSION: 1. Pulmonary hypoinflation. 2. Extensive coarse pulmonary infiltrates in keeping with underlying interstitial lung disease. 3. Superimposed diffuse interstitial pulmonary infiltrates, mildly progressed at the lung bases since remote prior examination of  11/10/2021 but stable since immediate prior examination of 01/18/2022, possibly reflecting superimposed infectious or inflammatory infiltrate. Electronically Signed   By: Fidela Salisbury M.D.   On: 01/19/2022 03:50   CT Angio Chest PE W and/or Wo Contrast  Result Date: 01/18/2022 CLINICAL DATA:  Pulmonary embolism (PE) suspected, high prob EXAM: CT ANGIOGRAPHY CHEST WITH CONTRAST TECHNIQUE: Multidetector CT imaging of the chest was performed using the standard protocol during bolus administration of intravenous contrast. Multiplanar CT image reconstructions and MIPs were obtained to evaluate the vascular anatomy. RADIATION DOSE REDUCTION: This  exam was performed according to the departmental dose-optimization program which includes automated exposure control, adjustment of the mA and/or kV according to patient size and/or use of iterative reconstruction technique. CONTRAST:  54m OMNIPAQUE IOHEXOL 350 MG/ML SOLN COMPARISON:  11/10/2021 FINDINGS: Cardiovascular: There is adequate opacification of the pulmonary arterial tree. No intraluminal filling defect identified to suggest acute pulmonary embolism. No significant coronary artery calcification. Global cardiac size within normal limits. No pericardial effusion. Central pulmonary arteries are of normal caliber. The thoracic aorta is unremarkable. Mediastinum/Nodes: There has developed moderate pneumomediastinum within the anterior mediastinum best appreciated substernally. Gas is seen tracking into the aortopulmonary groove as well as the right neck base. Visualized thyroid is unremarkable. Enhancing soft tissue within the subcarinal and bilateral hilar regions may represent reactive adenopathy. No pathologically enlarged thoracic adenopathy. Esophagus is unremarkable. Small hiatal hernia. Lungs/Pleura: There is extensive subpleural architectural distortion with honeycombing demonstrating a basilar predominance with associated traction bronchiectasis in keeping  with a UIP pattern of interstitial lung disease. There has developed mild focal consolidation within the posterior basal right lower lobe which may represent superimposed acute infection in the appropriate clinical setting. More sparse infiltrate is seen diffusely throughout the right lung base, best appreciated on axial image # 58/7. No central obstructing lesion. No pneumothorax or pleural effusion. Upper Abdomen: No acute abnormality. Musculoskeletal: No acute bone abnormality. No lytic or blastic bone lesion. Review of the MIP images confirms the above findings. IMPRESSION: 1. No pulmonary embolism. 2. Interval development of moderate pneumomediastinum. No pneumothorax. 3. Interval development of focal consolidation within the posterior basal right lower lobe which may represent superimposed acute infection in the appropriate clinical setting. More sparse infiltrate is seen diffusely throughout the right lung base. 4. UIP pattern of interstitial lung disease, grossly stable since prior examination. Findings are consistent with UIP per consensus guidelines: Diagnosis of Idiopathic Pulmonary Fibrosis: An Official ATS/ERS/JRS/ALAT Clinical Practice Guideline. [place] Iss 5, [phone] Mar 08 2017. Electronically Signed   By: AFidela SalisburyM.D.   On: 01/18/2022 20:38   DG Chest Portable 1 View  Result Date: 01/18/2022 CLINICAL DATA:  Cough EXAM: PORTABLE CHEST 1 VIEW COMPARISON:  11/10/2021 FINDINGS: Severe chronic interstitial disease throughout the lungs compatible with severe chronic fibrosis as seen on prior plain films and CT. No definite acute areas of consolidation or effusions. Heart is normal size. No acute bony abnormality. IMPRESSION: Severe chronic lung disease/fibrosis.  No definite acute process. Electronically Signed   By: KRolm BaptiseM.D.   On: 01/18/2022 18:57

## 2022-01-19 NOTE — Consult Note (Signed)
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 He had recent trip to [place] with family and while he was there developed loose stools, nausea, worsening dyspnea and intermittent fever. He took a home test for covid-19 night PTA which was negative.  As outpatient he was seen by [facility] program 12/28/21 and though underweight, was otherwise felt to be reasonable candidate for transplant, referred to transplant clinic. Last dose of ritux was 10/2021.  He was started on ceftriaxone, azithromycin, and solumedrol 80 BID. He has been afebrile here. Procal 1/CRP 17, BNP 28, covid+.  Pertinent  Medical History  RA RA-ILD Remote smoking  Significant Hospital Events: Including procedures, antibiotic start and stop dates in addition to other pertinent events   01/19/22 admitted started on CAP coverage,   Interim History / Subjective:    Objective   Blood pressure 109/68, pulse 86, temperature 98.3 F (36.8 C), temperature source Oral, resp. rate (!) 31, SpO2 100 %.        Intake/Output Summary (Last 24 hours) at 01/19/2022 0718 Last data filed at 01/19/2022 0650 Gross per 24 hour  Intake 1845.26 ml  Output 400 ml  Net 1445.26 ml   There were no vitals filed for this visit.  Examination: General appearance: 71 y.o., male, frail Eyes: anicteric sclerae; PERRL, tracking appropriately HENT: NCAT; MMM Neck: Trachea midline; no lymphadenopathy, no JVD Lungs: crackles and rhonchi, tachypneic with mildly increased respiratory effort CV: RRR, no  murmur  Abdomen: Soft, non-tender; non-distended, BS present  Extremities: No peripheral edema, warm Skin: Normal turgor and texture; no rash Psych: Appropriate affect Neuro: Alert and oriented to person and place, no focal deficit    Resolved Hospital Problem list   N/a  Assessment & Plan:   # Acute on chronic hypoxic respiratory failure # RA-ILD # Covid-19 pneumonia, possible superimposed CAP # Small-moderate pneumomediastinum, likely from cough-related barotrauma, underlying lung disease and covid-19 # Moderate protein calorie malnutrition  - ldh is high but could be marker of RA activity. He's not on chronic steroid but we'll check PJP DFA sputum. I think we can hold off on bronch/BAL as he's improved a bit symptomatically, reasonably high chance this decompensation could be explained by covid-19 and possibly superimposed CAP - can start solumedrol 60 mg IV daily tomorrow - agree with course of remdesivir - f/u urine strep and legionella, continue ceftriaxone, azithro - bronchodilators  - pulmonary hygiene - would not push PEP devices very hard today given pneumomediastinum, we'll check CXR tomorrow to at least make sure there's not significant progression - PT/mobilize   Best Practice (right click and "Reselect all SmartList Selections" daily)   Per [facility]  Labs   CBC: Recent Labs  Lab 01/18/22 1855 01/19/22 0307  WBC 10.0 7.0  NEUTROABS 8.1* 6.4  HGB 12.3* 11.3*  HCT 38.8* 35.4*  MCV 85.8 84.3  PLT 211 903    Basic Metabolic Panel: Recent Labs  Lab 01/18/22 1855 01/19/22 0307  NA 135 140  K 3.8 4.3  CL 101 103  CO2 27 25  GLUCOSE 123* 125*  BUN  16 9  CREATININE 1.03 0.92  CALCIUM 8.4* 8.3*  MG  --  1.8  PHOS  --  1.9*   GFR: Estimated Creatinine Clearance: 51 mL/min (by C-G formula based on SCr of 0.92 mg/dL). Recent Labs  Lab 01/18/22 1855 01/19/22 0307  PROCALCITON  --  1.03  WBC 10.0 7.0  LATICACIDVEN 1.9  --     Liver Function  Tests: Recent Labs  Lab 01/19/22 0307  AST 34  ALT 18  ALKPHOS 46  BILITOT 0.6  PROT 6.4*  ALBUMIN 2.4*   No results for input(s): "LIPASE", "AMYLASE" in the last 168 hours. No results for input(s): "AMMONIA" in the last 168 hours.  ABG    Component Value Date/Time   HCO3 28.9 (H) 01/19/2022 0309   O2SAT 99.2 01/19/2022 0309     Coagulation Profile: Recent Labs  Lab 01/19/22 0052  INR 1.1    Cardiac Enzymes: No results for input(s): "CKTOTAL", "CKMB", "CKMBINDEX", "TROPONINI" in the last 168 hours.  HbA1C: Hemoglobin A1C  Date/Time Value Ref Range Status  09/07/2019 10:04 AM 5.9 (A) 4.0 - 5.6 % Final  11/12/2017 09:59 AM 5.9  Final   Hgb A1c MFr Bld  Date/Time Value Ref Range Status  05/14/2019 09:56 AM 6.2 (H) 4.8 - 5.6 % Final    Comment:             Prediabetes: 5.7 - 6.4          Diabetes: >6.4          Glycemic control for adults with diabetes: <7.0   05/14/2017 04:34 PM 6.0 (H) 4.8 - 5.6 % Final    Comment:             Prediabetes: 5.7 - 6.4          Diabetes: >6.4          Glycemic control for adults with diabetes: <7.0     CBG: No results for input(s): "GLUCAP" in the last 168 hours.  Review of Systems:   12 point review of systems is negative except as in HPI  Past Medical History:  He,  has a past medical history of Abnormal weight loss (12/04/2021), Acute interstitial pneumonitis ([place]) (12/04/2021), Aortic atherosclerosis ([place]) (12/04/2021), Arthralgia of temporomandibular joint (12/04/2021), Arthritis, BMI 26.0-26.9,adult (10/22/2016), Chronic fatigue syndrome (12/04/2021), Dyslipidemia (03/10/2012), Family history of prostate cancer (12/04/2021), Glucose intolerance (impaired glucose tolerance), Hyperlipidemia, Interstitial lung disease ([place]) (07/16/2019), Long-term current use of high risk medication other than anticoagulant (07/16/2019), Other long term (current) drug therapy (12/04/2021), 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   Surgical History:   Past Surgical History:  Procedure Laterality Date   ROTATOR CUFF REPAIR Right 01/02/16     Social History:   reports that he quit smoking about 33 years ago. His smoking use included pipe. He has never used smokeless tobacco. He reports that he does not drink alcohol and does not use drugs.   Family History:  His family history includes Cancer (age of onset: 76) in his father; Cancer (age of onset: 37) in his mother; Diabetes in his brother and father; Heart disease in his brother and mother; Heart disease (age of onset: 43) in his father; Hyperlipidemia in his brother.   Allergies No Known Allergies   Home Medications  Prior to Admission medications   Medication Sig Start Date End Date Taking? Authorizing  Provider  albuterol (VENTOLIN HFA) 108 (90 Base) MCG/ACT inhaler Inhale 2 puffs into the lungs every 6 (six) hours as needed for wheezing or shortness of breath. 07/19/21   Spero Geralds, MD  aspirin EC 81 MG tablet Take 81 mg by mouth daily.    [provider]  azelastine (ASTELIN) 0.1 % nasal spray Place 2 sprays into both nostrils 2 (two) times daily. Use in each nostril as directed 12/25/21   Roney Marion, MD  cetirizine (ZYRTEC) 10 MG tablet Take 10 mg by mouth daily.    [provider]  diclofenac sodium (VOLTAREN) 1 % GEL Apply 2 g topically 4 (four) times daily. 09/18/18   Forrest Moron, MD  famotidine (PEPCID) 20 MG tablet Take 1 tablet (20 mg total) by mouth 2 (two) times daily. 12/25/21   Roney Marion, MD  fluticasone (FLONASE) 50 MCG/ACT nasal spray Place 1 spray into both nostrils daily. 08/22/20   Spero Geralds, MD  folic acid ([name]) 1 MG tablet Take 1 mg by mouth daily. 08/27/19   [provider]  ipratropium (ATROVENT) 0.06 % nasal spray  Place 1 spray into both nostrils 3 (three) times daily. 11/13/20   Spero Geralds, MD  levocetirizine (XYZAL) 5 MG tablet Take 1 tablet (5 mg total) by mouth every evening. 12/25/21   Roney Marion, MD  loperamide (IMODIUM) 2 MG capsule Take 2 mg by mouth as needed for diarrhea or loose stools.    [provider]  meloxicam (MOBIC) 7.5 MG tablet Take 1 tablet (7.5 mg total) by mouth daily as needed for pain. 01/16/22   Spero Geralds, MD  methotrexate (RHEUMATREX) 10 MG tablet Take 1 tablet (10 mg total) by mouth once a week. Caution: Chemotherapy. Protect from light. 01/16/22   Spero Geralds, MD  nitroGLYCERIN (NITROSTAT) 0.4 MG SL tablet Place 1 tablet (0.4 mg total) under the tongue every 5 (five) minutes as needed. 12/27/21 03/27/22  Revankar, Reita Cliche, MD  riTUXimab (RITUXAN IV) Inject into the vein.    [provider]  SYMBICORT 80-4.5 MCG/ACT inhaler Inhale 2 puffs by mouth twice daily 11/28/21   Horald Pollen, MD     Critical care time: n/a

## 2022-01-19 NOTE — ED Notes (Signed)
Pt and family updated regarding pts room status. Pts wife given recliner and warm blanket

## 2022-01-19 NOTE — Evaluation (Signed)
Clinical/Bedside Swallow Evaluation Patient Details  Name: Earl Thomas MRN: [id number] Date of Birth: [birth year]  Today's Date: 01/19/2022 Time: SLP Start Time (ACUTE ONLY): 1017 SLP Stop Time (ACUTE ONLY): 5102 SLP Time Calculation (min) (ACUTE ONLY): 21 min  Past Medical History:  Past Medical History:  Diagnosis Date   Abnormal weight loss 12/04/2021   Acute interstitial pneumonitis ([place]) 12/04/2021   Aortic atherosclerosis ([place]) 12/04/2021   Arthralgia of temporomandibular joint 12/04/2021   Arthritis    BMI 26.0-26.9,adult 10/22/2016   Chronic fatigue syndrome 12/04/2021   Dyslipidemia 03/10/2012   Family history of prostate cancer 12/04/2021   Glucose intolerance (impaired glucose tolerance)    Hyperlipidemia    Interstitial lung disease ([place]) 07/16/2019   Long-term current use of high risk medication other than anticoagulant 07/16/2019   Other long term (current) drug therapy 12/04/2021   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 male who presented to [facility] on 01/18/2022 by way of transfer from [place] ED with acute on chronic hypoxic respiratory failure in the setting of severe COVID-19 infection, sepsis. PMHx significant for chronic hypoxic respiratory failure on 3 L continuous nasal cannula, interstitial lung disease, rheumatoid arthritis, chronic diastolic heart failure,after presenting from home to the latter complaining of shortness of breath.    Assessment / Plan / Recommendation  Clinical Impression  Pt presents with a normal oropharyngeal swallow with no indications of dysphagia or  aspiration.  Oral mechanism exam was normal; no focal deficits.  Pt demonstrated thorough mastication of solids, the appearance of a brisk swallow response, no s/s of aspiration, and no obvious dyssynchrony of the swallow/respiratory cycles.  He reports occasional s/s of esophageal issues, like regurgitation and globus, but no signs of an oropharyngeal dysphagia.  Recommend that he continues a regular diet with thin liquids; give meds whole with water.  Suggested to Earl Thomas that he contact his PCP upon D/C and discuss his esophageal complaints. They are not acute and can reasonably wait until his current medical crisis is resolved. Earl Thomas agreed with plan. No SLP f/u needed. D/W RN. SLP Visit Diagnosis: Dysphagia, unspecified (R13.10)    Aspiration Risk  No limitations    Diet Recommendation   Regular solids, thin liquids  Medication Administration: Whole meds with liquid    Other  Recommendations Recommended Consults: Consider GI evaluation (as OP) Oral Care Recommendations: Oral care BID    Recommendations for follow up therapy are one component of a multi-disciplinary discharge planning process, led by the attending physician.  Recommendations may be updated based on patient status, additional functional criteria and insurance authorization.  Follow up Recommendations No SLP follow up        [place] Date of Onset: 01/18/22 HPI: Earl Thomas is a 71 y.o. male who presented to [facility] on 01/18/2022 by way of transfer from [place] ED with acute on chronic hypoxic respiratory failure in the setting of severe COVID-19 infection, sepsis. PMHx significant for chronic hypoxic respiratory failure on 3 L continuous nasal cannula, interstitial lung disease, rheumatoid arthritis, chronic diastolic heart  failure,after presenting from home to the latter complaining of shortness of breath. Type of Study: Bedside Swallow Evaluation Previous  Swallow Assessment: no Diet Prior to this Study: Regular;Thin liquids Temperature Spikes Noted: No Respiratory Status: Nasal cannula History of Recent Intubation: No Behavior/Cognition: Alert;Cooperative;Pleasant mood Oral Cavity Assessment: Within Functional Limits Oral Care Completed by SLP: No Oral Cavity - Dentition: Adequate natural dentition Vision: Functional for self-feeding Self-Feeding Abilities: Able to feed self Patient Positioning: Upright in bed Baseline Vocal Quality: Normal Volitional Cough: Strong Volitional Swallow: Able to elicit    Oral/Motor/Sensory Function Overall Oral Motor/Sensory Function: Within functional limits   Ice Chips Ice chips: Within functional limits   Thin Liquid Thin Liquid: Within functional limits    Nectar Thick Nectar Thick Liquid: Not tested   Honey Thick Honey Thick Liquid: Not tested   Puree Puree: Not tested   Solid     Solid: Within functional limits      Earl Thomas 01/19/2022,9:53 AM   Estill Bamberg L. Tivis Ringer, MA CCC/SLP Clinical Specialist - [place] Office number [phone]

## 2022-01-20 DIAGNOSIS — J9621 Acute and chronic respiratory failure with hypoxia: Secondary | ICD-10-CM | POA: Diagnosis not present

## 2022-01-20 LAB — CBC WITH DIFFERENTIAL/PLATELET
Abs Immature Granulocytes: 0.08 10*3/uL — ABNORMAL HIGH (ref 0.00–0.07)
Basophils Absolute: 0 10*3/uL (ref 0.0–0.1)
Basophils Relative: 0 %
Eosinophils Absolute: 0 10*3/uL (ref 0.0–0.5)
Eosinophils Relative: 0 %
HCT: 33.6 % — ABNORMAL LOW (ref 39.0–52.0)
Hemoglobin: 10.8 g/dL — ABNORMAL LOW (ref 13.0–17.0)
Immature Granulocytes: 1 %
Lymphocytes Relative: 7 %
Lymphs Abs: 0.7 10*3/uL (ref 0.7–4.0)
MCH: 27.3 pg (ref 26.0–34.0)
MCHC: 32.1 g/dL (ref 30.0–36.0)
MCV: 85.1 fL (ref 80.0–100.0)
Monocytes Absolute: 0.3 10*3/uL (ref 0.1–1.0)
Monocytes Relative: 3 %
Neutro Abs: 9.3 10*3/uL — ABNORMAL HIGH (ref 1.7–7.7)
Neutrophils Relative %: 89 %
Platelets: 200 10*3/uL (ref 150–400)
RBC: 3.95 MIL/uL — ABNORMAL LOW (ref 4.22–5.81)
RDW: 13.3 % (ref 11.5–15.5)
WBC: 10.4 10*3/uL (ref 4.0–10.5)
nRBC: 0 % (ref 0.0–0.2)

## 2022-01-20 LAB — BRAIN NATRIURETIC PEPTIDE: B Natriuretic Peptide: 83.7 pg/mL (ref 0.0–100.0)

## 2022-01-20 LAB — URINE CULTURE: Culture: NO GROWTH

## 2022-01-20 LAB — COMPREHENSIVE METABOLIC PANEL
ALT: 16 U/L (ref 0–44)
AST: 27 U/L (ref 15–41)
Albumin: 2.3 g/dL — ABNORMAL LOW (ref 3.5–5.0)
Alkaline Phosphatase: 42 U/L (ref 38–126)
Anion gap: 11 (ref 5–15)
BUN: 14 mg/dL (ref 8–23)
CO2: 25 mmol/L (ref 22–32)
Calcium: 7.8 mg/dL — ABNORMAL LOW (ref 8.9–10.3)
Chloride: 104 mmol/L (ref 98–111)
Creatinine, Ser: 0.73 mg/dL (ref 0.61–1.24)
GFR, Estimated: >60 mL/min (ref >60)
Glucose, Bld: 146 mg/dL — ABNORMAL HIGH (ref 70–99)
Potassium: 3.9 mmol/L (ref 3.5–5.1)
Sodium: 140 mmol/L (ref 135–145)
Total Bilirubin: <0.1 mg/dL — ABNORMAL LOW (ref 0.3–1.2)
Total Protein: 6 g/dL — ABNORMAL LOW (ref 6.5–8.1)

## 2022-01-20 LAB — C-REACTIVE PROTEIN: CRP: 9.8 mg/dL — ABNORMAL HIGH (ref <1.0)

## 2022-01-20 LAB — MAGNESIUM: Magnesium: 2 mg/dL (ref 1.7–2.4)

## 2022-01-20 LAB — D-DIMER, QUANTITATIVE: D-Dimer, Quant: 11.9 ug/mL-FEU — ABNORMAL HIGH (ref 0.00–0.50)

## 2022-01-20 MED ORDER — METHYLPREDNISOLONE SODIUM SUCC 125 MG IJ SOLR
60.0000 mg | Freq: Every day | INTRAMUSCULAR | Status: DC
  Administered 2022-01-20: 60 mg via INTRAVENOUS
  Filled 2022-01-20: qty 2

## 2022-01-20 MED ORDER — ENOXAPARIN SODIUM 60 MG/0.6ML IJ SOSY
60.0000 mg | PREFILLED_SYRINGE | Freq: Every day | INTRAMUSCULAR | Status: DC
  Administered 2022-01-20 – 2022-01-21 (×2): 60 mg via SUBCUTANEOUS
  Filled 2022-01-20 (×2): qty 0.6

## 2022-01-20 MED ORDER — IPRATROPIUM-ALBUTEROL 0.5-2.5 (3) MG/3ML IN SOLN
3.0000 mL | Freq: Three times a day (TID) | RESPIRATORY_TRACT | Status: DC
  Administered 2022-01-20: 3 mL via RESPIRATORY_TRACT
  Filled 2022-01-20: qty 3

## 2022-01-20 MED ORDER — SODIUM CHLORIDE 0.9 % IV SOLN
100.0000 mg | Freq: Once | INTRAVENOUS | Status: AC
  Administered 2022-01-20: 100 mg via INTRAVENOUS
  Filled 2022-01-20: qty 20

## 2022-01-20 MED ORDER — WHITE PETROLATUM EX OINT
TOPICAL_OINTMENT | CUTANEOUS | Status: DC | PRN
Start: 2022-01-20 — End: 2022-01-20
  Filled 2022-01-20 (×2): qty 5

## 2022-01-20 MED ORDER — WHITE PETROLATUM EX OINT
TOPICAL_OINTMENT | CUTANEOUS | Status: DC | PRN
Start: 2022-01-20 — End: 2022-01-20
  Filled 2022-01-20: qty 28.35

## 2022-01-20 MED ORDER — WHITE PETROLATUM 100 % EX OINT
1.0000 | TOPICAL_OINTMENT | CUTANEOUS | Status: DC | PRN
  Filled 2022-01-20: qty 28

## 2022-01-20 MED ORDER — WHITE PETROLATUM EX OINT
TOPICAL_OINTMENT | CUTANEOUS | Status: DC | PRN
Start: 2022-01-20 — End: 2022-01-20

## 2022-01-20 MED ORDER — IPRATROPIUM-ALBUTEROL 0.5-2.5 (3) MG/3ML IN SOLN
3.0000 mL | Freq: Two times a day (BID) | RESPIRATORY_TRACT | Status: DC
  Administered 2022-01-20 – 2022-01-21 (×2): 3 mL via RESPIRATORY_TRACT
  Filled 2022-01-20 (×2): qty 3

## 2022-01-20 MED ORDER — PREDNISONE 50 MG PO TABS
60.0000 mg | ORAL_TABLET | Freq: Every day | ORAL | Status: DC
  Administered 2022-01-21: 60 mg via ORAL
  Filled 2022-01-20: qty 1

## 2022-01-20 NOTE — Plan of Care (Signed)
  Problem: Education: Goal: Knowledge of risk factors and measures for prevention of condition will improve Outcome: Progressing   Problem: Elimination: Goal: Will not experience complications related to urinary retention Outcome: Progressing

## 2022-01-20 NOTE — Evaluation (Signed)
Physical Therapy Evaluation Patient Details Name: Earl Thomas MRN: [id number] DOB: 08-Oct-1950 Today's Date: 01/20/2022  History of Present Illness  Mr, Earl Thomas is a 71 yo male brought in by [place] from [place]. SOB for one week, covid+, productive cough. Ct revealed moderate pneumomediastinum, severe sepsis, with potential for superimposed bacterial infection, COVID PNA, Acute on chronic hypoxic respiratory failure.Bilateral lower extremity venous duplex-negative 7/15.  Hx interstitial lung disease, 3L  at baseline, rheumatoid arthritis, chronic diastolic heart failure.   Clinical Impression  Pt in bed upon arrival of PT, agreeable to evaluation at this time. Prior to admission the pt was independent with mobility, but chronically limited in endurance and dependent on 3L O2. The pt now presents with limitations in functional mobility, endurance, and dynamic stability due to above dx, but was able to complete 60 ft ambulation with supervision and without need for DME with SpO2 >98% while ambulating. He did desat to low of 90% during recovery, and required 3-5 min seated rest to recover SOB and increased RR. He was then able to complete additional 40 ft ambulation with SpO2 again maintained >90%. Will continue to benefit from skilled PT acutely to progress endurance and independence prior to return home.         Recommendations for follow up therapy are one component of a multi-disciplinary discharge planning process, led by the attending physician.  Recommendations may be updated based on patient status, additional functional criteria and insurance authorization.  Follow Up Recommendations Home health PT      Assistance Recommended at Discharge Intermittent Supervision/Assistance  Patient can return home with the following       Equipment Recommendations None recommended by PT  Recommendations for Other Services       Functional Status Assessment Patient has had a recent  decline in their functional status and demonstrates the ability to make significant improvements in function in a reasonable and predictable amount of time.     Precautions / Restrictions Precautions Precautions: Fall Precaution Comments: watch O2, on 3L this session Restrictions Weight Bearing Restrictions: No      Mobility  Bed Mobility Overal bed mobility: Independent             General bed mobility comments: increased time but no assist given    Transfers Overall transfer level: Needs assistance Equipment used: None Transfers: Sit to/from Stand Sit to Stand: Supervision           General transfer comment: supervision for safety, no UE support needed    Ambulation/Gait Ambulation/Gait assistance: Supervision Gait Distance (Feet): 60 Feet (+ 40) Assistive device: None Gait Pattern/deviations: Step-through pattern, Decreased stride length Gait velocity: decreased     General Gait Details: slowed gait but steady. SpO2 maintained >98% with gait, desats to low of 90% on 3L after exertion. required 5 min to recover. Pt reports this is normal for him at home.      Balance Overall balance assessment: Mild deficits observed, not formally tested                                           Pertinent Vitals/Pain Pain Assessment Pain Assessment: No/denies pain    Home Living Family/patient expects to be discharged to:: Private residence Living Arrangements: Spouse/significant other Available Help at Discharge: Family;Available 24 hours/day Type of Home: House Home Access: Level entry     Alternate  Level Stairs-Number of Steps: 16 total Home Layout: Two level Home Equipment: Shower seat Additional Comments: O2 3 liters day, 2 liters night, suction cup grab bar    Prior Function Prior Level of Function : Independent/Modified Independent             Mobility Comments: no DME, 3L to walk in house, typically not more than 20-30 ft at a  time ADLs Comments: pt reports independence, limited by endurance     Hand Dominance   Dominant Hand: Right    Extremity/Trunk Assessment   Upper Extremity Assessment Upper Extremity Assessment: Defer to OT evaluation    Lower Extremity Assessment Lower Extremity Assessment: Generalized weakness    Cervical / Trunk Assessment Cervical / Trunk Assessment: Kyphotic  Communication   Communication: No difficulties  Cognition Arousal/Alertness: Awake/alert Behavior During Therapy: WFL for tasks assessed/performed Overall Cognitive Status: Within Functional Limits for tasks assessed                                          General Comments General comments (skin integrity, edema, etc.): SpO2 stable >98% with gait on 3L, requires increased time to rest and recover but SpO2 remained >90%    Exercises     Assessment/Plan    PT Assessment Patient needs continued PT services  PT Problem List Decreased strength;Decreased activity tolerance;Decreased balance;Decreased mobility;Cardiopulmonary status limiting activity       PT Treatment Interventions Gait training;DME instruction;Stair training;Functional mobility training;Therapeutic activities;Therapeutic exercise;Balance training;Patient/family education    PT Goals (Current goals can be found in the Care Plan section)  Acute Rehab PT Goals Patient Stated Goal: return home PT Goal Formulation: With patient/family Time For Goal Achievement: 02/03/22 Potential to Achieve Goals: Good    Frequency Min 3X/week        AM-PAC PT "6 Clicks" Mobility  Outcome Measure Help needed turning from your back to your side while in a flat bed without using bedrails?: None Help needed moving from lying on your back to sitting on the side of a flat bed without using bedrails?: None Help needed moving to and from a bed to a chair (including a wheelchair)?: None Help needed standing up from a chair using your arms (e.g.,  wheelchair or bedside chair)?: None Help needed to walk in hospital room?: A Little Help needed climbing 3-5 steps with a railing? : A Little 6 Click Score: 22    End of Session Equipment Utilized During Treatment: Gait belt;Oxygen Activity Tolerance: Patient tolerated treatment well Patient left: in chair;with call bell/phone within reach;with family/visitor present Nurse Communication: Mobility status PT Visit Diagnosis: Other abnormalities of gait and mobility (R26.89)    Time: 1292-9090 PT Time Calculation (min) (ACUTE ONLY): 31 min   Charges:   PT Evaluation $PT Eval Low Complexity: 1 Low PT Treatments $Therapeutic Exercise: 8-22 mins        [place] Car[place], PT, DPT   Acute Rehabilitation Department  Sandra Cockayne 01/20/2022, 2:14 PM

## 2022-01-20 NOTE — Evaluation (Signed)
Occupational Therapy Evaluation and Discharge Patient Details Name: Earl Thomas MRN: [id number] DOB: 1950/12/22 Today's Date: 01/20/2022   History of Present Illness Earl Thomas, Earl Thomas is a 71 yo male brought in by [place] from [place]. SOB for one week, covid+, productive cough. Ct revealed moderate pneumomediastinum, severe sepsis, with potential for superimposed bacterial infection, COVID PNA, Acute on chronic hypoxic respiratory failure.Bilateral lower extremity venous duplex-negative 7/15.  Hx interstitial lung disease, 3L [place] at baseline, rheumatoid arthritis, chronic diastolic heart failure.   Clinical Impression   This 71 yo male admitted with above presents to acute OT with PLOF of being totally independent with all basic ADLs and driving. Currently he is hindered by increased WOB with even minimal activity. He will benefit from [facility], all Acute OT education completed, we will D/C from acute OT.      Recommendations for follow up therapy are one component of a multi-disciplinary discharge planning process, led by the attending physician.  Recommendations may be updated based on patient status, additional functional criteria and insurance authorization.   Follow Up Recommendations  Home health OT    Assistance Recommended at Discharge Frequent or constant Supervision/Assistance  Patient can return home with the following Assistance with cooking/housework;Assist for transportation;Help with stairs or ramp for entrance;A little help with bathing/dressing/bathroom;A little help with walking and/or transfers    Functional Status Assessment  Patient has had a recent decline in their functional status and demonstrates the ability to make significant improvements in function in a reasonable and predictable amount of time. (HHOT has been recommended)  Equipment Recommendations  None recommended by OT       Precautions / Restrictions Precautions Precaution Comments: monitor  vitals Restrictions Weight Bearing Restrictions: No      Mobility Bed Mobility Overal bed mobility: Independent                  Transfers Overall transfer level: Independent                 General transfer comment: Pt with DOE with even short distances (20 feet), sats as low as 90 on 3 liters and RR Up to as high as 40      Balance Overall balance assessment: Mild deficits observed, not formally tested                                         ADL either performed or assessed with clinical judgement   ADL                                         General ADL Comments: Overall a S level, educated on purse lipped breathing, using O2 in shower and when going up and down the steps to 2nd level in home, gave pt/wife handout on energy conservation, recommended they get a hand held shower.     Vision Baseline Vision/History: 1 Wears glasses Ability to See in Adequate Light: 0 Adequate Patient Visual Report: No change from baseline              Pertinent Vitals/Pain Pain Assessment Pain Assessment: No/denies pain     Hand Dominance Right   Extremity/Trunk Assessment Upper Extremity Assessment Upper Extremity Assessment: Overall WFL for tasks assessed  Communication Communication Communication: No difficulties   Cognition Arousal/Alertness: Awake/alert Behavior During Therapy: WFL for tasks assessed/performed Overall Cognitive Status: Within Functional Limits for tasks assessed                                                  Home Living Family/patient expects to be discharged to:: Private residence Living Arrangements: Spouse/significant other Available Help at Discharge: Family;Available 24 hours/day Type of Home: House Home Access: Level entry     Home Layout: Two level Alternate Level Stairs-Number of Steps: 16 total   Bathroom Shower/Tub: Walk-in shower;Door   [employer]  Toilet: Standard     Home Equipment: Building services engineer Comments: O2 3 liters day, 2 liters night, suction cup grab bar      Prior Functioning/Environment Prior Level of Function : Independent/Modified Independent                        OT Problem List: Decreased activity tolerance         OT Goals(Current goals can be found in the care plan section) Acute Rehab OT Goals Patient Stated Goal: to feel better and go home         AM-PAC OT "6 Clicks" Daily Activity     Outcome Measure Help from another person eating meals?: None Help from another person taking care of personal grooming?: A Little Help from another person toileting, which includes using toliet, bedpan, or urinal?: A Little Help from another person bathing (including washing, rinsing, drying)?: A Little Help from another person to put on and taking off regular upper body clothing?: A Little Help from another person to put on and taking off regular lower body clothing?: A Little 6 Click Score: 19   End of Session Equipment Utilized During Treatment: Oxygen (3 liters)  Activity Tolerance: Patient tolerated treatment well Patient left: in bed;with call bell/phone within reach;with family/visitor present  OT Visit Diagnosis: Muscle weakness (generalized) (M62.81)                Time: 8757-9728 OT Time Calculation (min): 43 min Charges:  OT General Charges $OT Visit: 1 Visit OT Evaluation $OT Eval Moderate Complexity: 1 Mod OT Treatments $Self Care/Home Management : 23-37 mins  [place] [address], OTR/L Acute Rehab Services [facility] [phone] Office [phone]    Almon Register 01/20/2022, 9:41 AM

## 2022-01-20 NOTE — Progress Notes (Signed)
PROGRESS NOTE                                                                                                                                                                                                             Patient Demographics:    Earl Thomas, is a 71 y.o. male, DOB - 10/27/1950, LJQ:[id number]  Outpatient Primary MD for the patient is Mitchel Honour, Ines Bloomer, MD    LOS - 1  Admit date - 01/18/2022    Chief Complaint  Patient presents with   Shortness of Breath       Brief Narrative (HPI from H&P)   71 y.o. male with medical history significant for chronic hypoxic respiratory failure on 3 L continuous nasal cannula, interstitial lung disease, rheumatoid arthritis, chronic diastolic heart failure, who was admitted to the hospital with chief complaints of dry cough and shortness of breath, work-up consistent with community-acquired pneumonia, COVID-19 infection likely acute with pneumomediastinum.  Pulmonary was consulted and he was admitted by hospitalist team.   Subjective:   Patient in bed, appears comfortable, denies any headache, no fever, no chest pain or pressure, improved shortness of breath , no abdominal pain. No new focal weakness.    Assessment  & Plan :    Acute on chronic hypoxic respiratory failure in a patient with history of underlying ILD on 3 L nasal cannula oxygen. Work-up in the ER consistent with community-acquired pneumonia along with acute COVID-19 infection with CT count of 21 & pneumomediastinum.  Pulmonary is on board.  He is currently on appropriate IV antibiotics for community-acquired pneumonia along with the IV steroids for COVID-19 inflammation.  He is also on remdesivir which was started upon admission and will be continued.  Clinically he is improving.  Likely discharge in the next 1 to 2 days if continues to improve.  Severe sepsis due to #1 above.  Resolved with supportive care.   Appears nontoxic now.  Chronic diastolic CHF recent EF 21%.  Compensated.  Moderate to severe protein calorie malnutrition.  Protein supplements.  GERD.  Pepcid  ++Elevated D-dimer.  CTA & Leg [place] negative,   placed on moderate dose Lovenox for prophylaxis.  D-dimer due to COVID-19 inflammation improving with steroids and Lovenox.  Pneumomediastinum.  Defer to pulmonary.  Mildly elevated LDH.  Discussed with pulmonary they will evaluate and monitor.  Condition - Extremely Guarded  Family Communication  : Wife bedside on 01/19/2022  Code Status : Full code  Consults  : Pulmonary  PUD Prophylaxis : Pepcid   Procedures  :     CTA - 1. No pulmonary embolism. 2. Interval development of moderate pneumomediastinum. No pneumothorax. 3. Interval development of focal consolidation within the posterior basal right lower lobe which may represent superimposed acute infection in the appropriate clinical setting. More sparse infiltrate is seen diffusely throughout the right lung base. 4. UIP pattern of interstitial lung disease, grossly stable since prior examination. Findings are consistent with UIP per consensus guidelines: Diagnosis of Idiopathic Pulmonary Fibrosis  Leg [place] -       Disposition Plan  :    Status is: Inpatient  DVT Prophylaxis  : Lovenox added 01/19/2022  SCDs Start: 01/19/22 0144     Lab Results  Component Value Date   PLT 200 01/20/2022    Diet :  Diet Order             Diet regular Room service appropriate? Yes; Fluid consistency: Thin  Diet effective now                    Inpatient Medications  Scheduled Meds:  (feeding supplement) PROSource Plus  30 mL Oral BID BM   arformoterol  15 mcg Nebulization BID   aspirin EC  81 mg Oral Daily   enoxaparin (LOVENOX) injection  60 mg Subcutaneous Daily   famotidine  20 mg Oral BID   feeding supplement  237 mL Oral BID BM   ipratropium-albuterol  3 mL Nebulization BID   loratadine  10 mg Oral  Daily   methylPREDNISolone (SOLU-MEDROL) injection  60 mg Intravenous Daily   Continuous Infusions:  azithromycin 500 mg (01/19/22 1853)   cefTRIAXone (ROCEPHIN)  IV 2 g (01/19/22 1813)   PRN Meds:.acetaminophen **OR** acetaminophen, albuterol, guaiFENesin-dextromethorphan  Time Spent in minutes  30   Lala Lund M.D on 01/20/2022 at 11:32 AM  To page go to [email or web address]   [facility] -  Office  [phone]  See all Orders from today for further details    Objective:   Vitals:   01/20/22 0000 01/20/22 0200 01/20/22 0400 01/20/22 0803  BP: 118/67 120/65 120/75   Pulse: 86 83 66   Resp: (!) 36 (!) 31 (!) 28   Temp:   98.2 F (36.8 C)   TempSrc:   Oral   SpO2: 100% 100% 100% 94%  Weight:   49.7 kg   Height:        Wt Readings from Last 3 Encounters:  01/20/22 49.7 kg  01/16/22 46.9 kg  01/14/22 48.1 kg     Intake/Output Summary (Last 24 hours) at 01/20/2022 1132 Last data filed at 01/20/2022 1000 Gross per 24 hour  Intake 2002.1 ml  Output 475 ml  Net 1527.1 ml     Physical Exam  Awake Alert, No new F.N deficits, Normal affect [place].AT,PERRAL Supple Neck, No JVD,   Symmetrical Chest wall movement, Good air movement bilaterally, CTAB RRR,No Gallops, Rubs or new Murmurs,  +ve B.Sounds, Abd Soft, No tenderness,   No Cyanosis, Clubbing or edema       Data Review:    CBC Recent Labs  Lab 01/18/22 1855 01/19/22 0307 01/20/22 0445  WBC 10.0 7.0 10.4  HGB 12.3* 11.3* 10.8*  HCT 38.8* 35.4* 33.6*  PLT 211 205 200  MCV 85.8 84.3 85.1  MCH 27.2 26.9 27.3  MCHC 31.7 31.9 32.1  RDW 13.6 13.6 13.3  LYMPHSABS 1.0 0.4* 0.7  MONOABS 0.7 0.1 0.3  EOSABS 0.2 0.0 0.0  BASOSABS 0.0 0.0 0.0    Electrolytes Recent Labs  Lab 01/18/22 1855 01/19/22 0052 01/19/22 0307 01/19/22 0450 01/20/22 0445  NA 135  --  140  --  140  K 3.8  --  4.3  --  3.9  CL 101  --  103  --  104  CO2 27  --  25  --  25  GLUCOSE 123*  --  125*  --  146*  BUN 16  --   9  --  14  CREATININE 1.03  --  0.92  --  0.73  CALCIUM 8.4*  --  8.3*  --  7.8*  AST  --   --  34  --  27  ALT  --   --  18  --  16  ALKPHOS  --   --  46  --  42  BILITOT  --   --  0.6  --  <0.1*  ALBUMIN  --   --  2.4*  --  2.3*  MG  --   --  1.8  --  2.0  CRP  --   --   --  17.3* 9.8*  DDIMER  --  13.34*  --   --  11.90*  PROCALCITON  --   --  1.03  --   --   LATICACIDVEN 1.9  --   --   --   --   INR  --  1.1  --   --   --   BNP  --   --  28.2  --  83.7    ------------------------------------------------------------------------------------------------------------------ No results for input(s): "CHOL", "HDL", "LDLCALC", "TRIG", "CHOLHDL", "LDLDIRECT" in the last 72 hours.  Lab Results  Component Value Date   HGBA1C 5.9 (A) 09/07/2019    ------------------------------------------------------------------------------------------------------------------ ID Labs Recent Labs  Lab 01/18/22 1855 01/19/22 0052 01/19/22 0307 01/19/22 0450 01/20/22 0445  WBC 10.0  --  7.0  --  10.4  PLT 211  --  205  --  200  CRP  --   --   --  17.3* 9.8*  DDIMER  --  13.34*  --   --  11.90*  PROCALCITON  --   --  1.03  --   --   LATICACIDVEN 1.9  --   --   --   --   CREATININE 1.03  --  0.92  --  0.73   Radiology Reports VAS [place] LOWER EXTREMITY VENOUS (DVT)  Result Date: 01/19/2022  Lower Venous DVT Study Patient Name:  CAULIN BEGLEY  Date of Exam:   01/19/2022 Medical Rec #: [id number]             Accession #:    [id number] Date of Birth: [birth year]             Patient Gender: M Patient Age:   36 years Exam Location:  [facility] Procedure:      VAS [place] LOWER EXTREMITY VENOUS (DVT) Referring Phys: Deno Etienne [facility] --------------------------------------------------------------------------------  Indications: Covid, elevated D-Dimer.  Comparison Study: No prior study on file Performing Technologist: Sharion Dove RVS  Examination Guidelines: A complete evaluation includes B-mode imaging,  spectral Doppler, color Doppler, and power Doppler as needed of all accessible portions of each vessel. Bilateral testing is considered an integral part of a complete examination. Limited examinations for reoccurring  indications may be performed as noted. The reflux portion of the exam is performed with the patient in reverse Trendelenburg.  +---------+---------------+---------+-----------+----------+--------------+ RIGHT    CompressibilityPhasicitySpontaneityPropertiesThrombus Aging +---------+---------------+---------+-----------+----------+--------------+ CFV      Full           Yes      Yes                                 +---------+---------------+---------+-----------+----------+--------------+ SFJ      Full                                                        +---------+---------------+---------+-----------+----------+--------------+ FV Prox  Full                                                        +---------+---------------+---------+-----------+----------+--------------+ FV Mid   Full                                                        +---------+---------------+---------+-----------+----------+--------------+ FV DistalFull                                                        +---------+---------------+---------+-----------+----------+--------------+ PFV      Full                                                        +---------+---------------+---------+-----------+----------+--------------+ POP      Full           Yes      Yes                                 +---------+---------------+---------+-----------+----------+--------------+ PTV      Full                                                        +---------+---------------+---------+-----------+----------+--------------+ PERO     Full                                                        +---------+---------------+---------+-----------+----------+--------------+    +---------+---------------+---------+-----------+----------+--------------+ LEFT     CompressibilityPhasicitySpontaneityPropertiesThrombus Aging +---------+---------------+---------+-----------+----------+--------------+ CFV      Full  Yes      Yes                                 +---------+---------------+---------+-----------+----------+--------------+ SFJ      Full                                                        +---------+---------------+---------+-----------+----------+--------------+ FV Prox  Full                                                        +---------+---------------+---------+-----------+----------+--------------+ FV Mid   Full                                                        +---------+---------------+---------+-----------+----------+--------------+ FV DistalFull                                                        +---------+---------------+---------+-----------+----------+--------------+ PFV      Full                                                        +---------+---------------+---------+-----------+----------+--------------+ POP      Full           Yes      Yes                                 +---------+---------------+---------+-----------+----------+--------------+ PTV      Full                                                        +---------+---------------+---------+-----------+----------+--------------+ PERO     Full                                                        +---------+---------------+---------+-----------+----------+--------------+    Summary: BILATERAL: - No evidence of deep vein thrombosis seen in the lower extremities, bilaterally. -No evidence of popliteal cyst, bilaterally.   *See table(s) above for measurements and observations.    Preliminary    DG Chest Port 1 View  Result Date: 01/19/2022 CLINICAL DATA:  Dyspnea.  [id number] EXAM: PORTABLE CHEST 1 VIEW COMPARISON:   01/18/2022 FINDINGS: Lung volumes are small. Superimposed diffuse coarse interstitial  pulmonary infiltrates, more prevalent at the lung bases bilaterally, are again identified in keeping with changes of underlying interstitial lung disease, better assessed on CT examination of 01/18/2022. These appears stable since immediate prior examination, though appear mildly progressed at the lung bases bilaterally since remote prior examination of 11/10/2021. This may reflect superimposed infectious or inflammatory infiltrate, again better appreciated on prior CT examination. No pneumothorax or pleural effusion. Cardiac size within normal limits. No acute bone abnormality. IMPRESSION: 1. Pulmonary hypoinflation. 2. Extensive coarse pulmonary infiltrates in keeping with underlying interstitial lung disease. 3. Superimposed diffuse interstitial pulmonary infiltrates, mildly progressed at the lung bases since remote prior examination of 11/10/2021 but stable since immediate prior examination of 01/18/2022, possibly reflecting superimposed infectious or inflammatory infiltrate. Electronically Signed   By: Fidela Salisbury M.D.   On: 01/19/2022 03:50   CT Angio Chest PE W and/or Wo Contrast  Result Date: 01/18/2022 CLINICAL DATA:  Pulmonary embolism (PE) suspected, high prob EXAM: CT ANGIOGRAPHY CHEST WITH CONTRAST TECHNIQUE: Multidetector CT imaging of the chest was performed using the standard protocol during bolus administration of intravenous contrast. Multiplanar CT image reconstructions and MIPs were obtained to evaluate the vascular anatomy. RADIATION DOSE REDUCTION: This exam was performed according to the departmental dose-optimization program which includes automated exposure control, adjustment of the mA and/or kV according to patient size and/or use of iterative reconstruction technique. CONTRAST:  66m OMNIPAQUE IOHEXOL 350 MG/ML SOLN COMPARISON:  11/10/2021 FINDINGS: Cardiovascular: There is adequate opacification  of the pulmonary arterial tree. No intraluminal filling defect identified to suggest acute pulmonary embolism. No significant coronary artery calcification. Global cardiac size within normal limits. No pericardial effusion. Central pulmonary arteries are of normal caliber. The thoracic aorta is unremarkable. Mediastinum/Nodes: There has developed moderate pneumomediastinum within the anterior mediastinum best appreciated substernally. Gas is seen tracking into the aortopulmonary groove as well as the right neck base. Visualized thyroid is unremarkable. Enhancing soft tissue within the subcarinal and bilateral hilar regions may represent reactive adenopathy. No pathologically enlarged thoracic adenopathy. Esophagus is unremarkable. Small hiatal hernia. Lungs/Pleura: There is extensive subpleural architectural distortion with honeycombing demonstrating a basilar predominance with associated traction bronchiectasis in keeping with a UIP pattern of interstitial lung disease. There has developed mild focal consolidation within the posterior basal right lower lobe which may represent superimposed acute infection in the appropriate clinical setting. More sparse infiltrate is seen diffusely throughout the right lung base, best appreciated on axial image # 58/7. No central obstructing lesion. No pneumothorax or pleural effusion. Upper Abdomen: No acute abnormality. Musculoskeletal: No acute bone abnormality. No lytic or blastic bone lesion. Review of the MIP images confirms the above findings. IMPRESSION: 1. No pulmonary embolism. 2. Interval development of moderate pneumomediastinum. No pneumothorax. 3. Interval development of focal consolidation within the posterior basal right lower lobe which may represent superimposed acute infection in the appropriate clinical setting. More sparse infiltrate is seen diffusely throughout the right lung base. 4. UIP pattern of interstitial lung disease, grossly stable since prior  examination. Findings are consistent with UIP per consensus guidelines: Diagnosis of Idiopathic Pulmonary Fibrosis: An Official ATS/ERS/JRS/ALAT Clinical Practice Guideline. [place] Iss 5, [phone] Mar 08 2017. Electronically Signed   By: AFidela SalisburyM.D.   On: 01/18/2022 20:38   DG Chest Portable 1 View  Result Date: 01/18/2022 CLINICAL DATA:  Cough EXAM: PORTABLE CHEST 1 VIEW COMPARISON:  11/10/2021 FINDINGS: Severe chronic interstitial disease throughout the lungs compatible with severe chronic fibrosis  as seen on prior plain films and CT. No definite acute areas of consolidation or effusions. Heart is normal size. No acute bony abnormality. IMPRESSION: Severe chronic lung disease/fibrosis.  No definite acute process. Electronically Signed   By: Rolm Baptise M.D.   On: 01/18/2022 18:57

## 2022-01-20 NOTE — Progress Notes (Signed)
   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 He had recent trip to [place] with family and while he was there developed loose stools, nausea, worsening dyspnea and intermittent fever. He took a home test for covid-19 night PTA which was negative.  As outpatient he was seen by [facility] program 12/28/21 and though underweight, was otherwise felt to be reasonable candidate for transplant, referred to transplant clinic. Last dose of ritux was 10/2021.  He was started on ceftriaxone, azithromycin, and solumedrol 80 BID. He has been afebrile here. Procal 1/CRP 17, BNP 28, covid+.  Pertinent  Medical History  RA RA-ILD Remote smoking  Significant Hospital Events: Including procedures, antibiotic start and stop dates in addition to other pertinent events   01/19/22 admitted started on CAP coverage,   Interim History / Subjective:   Working with PT today, on 3L o2 feeling a little better.  Objective   Blood pressure 120/75, pulse 66, temperature 98.2 F (36.8 C), temperature source Oral, resp. rate (!) 28, height _0  (1.651 m), weight 49.7 kg, SpO2 94 %.        Intake/Output Summary (Last 24 hours) at 01/20/2022 1442 Last data filed at 01/20/2022 1000 Gross per 24 hour  Intake 1901.56 ml  Output 475 ml  Net 1426.56 ml   Filed Weights   01/19/22 1743 01/20/22 0400  Weight: 46.8 kg 49.7 kg    Examination: General appearance: 71 y.o., male, frail Eyes: tracking Neck: Trachea midline; no lymphadenopathy, no JVD Lungs: crackles and  rhonchi, tachypneic with mildly increased respiratory effort CV: RRR, no murmur  Abdomen: Soft, non-tender; non-distended, BS present  Extremities: No peripheral edema, warm Skin: Normal turgor and texture; no rash Psych: Appropriate affect Neuro: grossly nonfocal   Resolved Hospital Problem list   N/a  Assessment & Plan:   # Acute on chronic hypoxic respiratory failure # RA-ILD # Covid-19 pneumonia, possible superimposed CAP # Small-moderate pneumomediastinum, likely from cough-related barotrauma, underlying lung disease and covid-19 # Moderate protein calorie malnutrition  - ldh is high but could be marker of RA activity. He's not on chronic steroid but we'll check PJP DFA sputum. I think we can hold off on bronch/BAL as he's improved a bit symptomatically, reasonably high chance this decompensation could be explained by covid-19 and possibly superimposed CAP - would complete 5d course of ABX for CAP - start prednisone 60 mg daily tomorrow, taper by 10 mg each week, we will arrange pulmonary clinic follow up before he's done with steroid course - will need bactrim 1 DS tablet TIW for PJP ppx at discharge - agree with course of remdesivir - f/u urine strep and legionella, - bronchodilators, resume symbicort at discharge  - pulmonary hygiene - still would not push PEP devices very hard today given pneumomediastinum - PT/mobilize  Will sign off but glad to be reinvolved as condition changes   Best Practice (right click and "Reselect all SmartList Selections" daily)   Per [facility]  Critical care time: n/a

## 2022-01-21 ENCOUNTER — Other Ambulatory Visit (HOSPITAL_COMMUNITY): Payer: Self-pay

## 2022-01-21 DIAGNOSIS — J9621 Acute and chronic respiratory failure with hypoxia: Secondary | ICD-10-CM | POA: Diagnosis not present

## 2022-01-21 LAB — COMPREHENSIVE METABOLIC PANEL
ALT: 28 U/L (ref 0–44)
AST: 41 U/L (ref 15–41)
Albumin: 2.2 g/dL — ABNORMAL LOW (ref 3.5–5.0)
Alkaline Phosphatase: 41 U/L (ref 38–126)
Anion gap: 6 (ref 5–15)
BUN: 18 mg/dL (ref 8–23)
CO2: 27 mmol/L (ref 22–32)
Calcium: 8 mg/dL — ABNORMAL LOW (ref 8.9–10.3)
Chloride: 109 mmol/L (ref 98–111)
Creatinine, Ser: 0.88 mg/dL (ref 0.61–1.24)
GFR, Estimated: >60 mL/min (ref >60)
Glucose, Bld: 127 mg/dL — ABNORMAL HIGH (ref 70–99)
Potassium: 3.8 mmol/L (ref 3.5–5.1)
Sodium: 142 mmol/L (ref 135–145)
Total Bilirubin: 0.2 mg/dL — ABNORMAL LOW (ref 0.3–1.2)
Total Protein: 5.8 g/dL — ABNORMAL LOW (ref 6.5–8.1)

## 2022-01-21 LAB — CBC WITH DIFFERENTIAL/PLATELET
Abs Immature Granulocytes: 0.08 10*3/uL — ABNORMAL HIGH (ref 0.00–0.07)
Basophils Absolute: 0 10*3/uL (ref 0.0–0.1)
Basophils Relative: 0 %
Eosinophils Absolute: 0 10*3/uL (ref 0.0–0.5)
Eosinophils Relative: 0 %
HCT: 33.1 % — ABNORMAL LOW (ref 39.0–52.0)
Hemoglobin: 10.8 g/dL — ABNORMAL LOW (ref 13.0–17.0)
Immature Granulocytes: 1 %
Lymphocytes Relative: 6 %
Lymphs Abs: 0.8 10*3/uL (ref 0.7–4.0)
MCH: 27.5 pg (ref 26.0–34.0)
MCHC: 32.6 g/dL (ref 30.0–36.0)
MCV: 84.2 fL (ref 80.0–100.0)
Monocytes Absolute: 0.5 10*3/uL (ref 0.1–1.0)
Monocytes Relative: 4 %
Neutro Abs: 11.5 10*3/uL — ABNORMAL HIGH (ref 1.7–7.7)
Neutrophils Relative %: 89 %
Platelets: 205 10*3/uL (ref 150–400)
RBC: 3.93 MIL/uL — ABNORMAL LOW (ref 4.22–5.81)
RDW: 13.4 % (ref 11.5–15.5)
WBC: 12.9 10*3/uL — ABNORMAL HIGH (ref 4.0–10.5)
nRBC: 0 % (ref 0.0–0.2)

## 2022-01-21 LAB — C-REACTIVE PROTEIN: CRP: 4.9 mg/dL — ABNORMAL HIGH (ref <1.0)

## 2022-01-21 LAB — BRAIN NATRIURETIC PEPTIDE: B Natriuretic Peptide: 151.3 pg/mL — ABNORMAL HIGH (ref 0.0–100.0)

## 2022-01-21 LAB — D-DIMER, QUANTITATIVE: D-Dimer, Quant: 13.32 ug/mL-FEU — ABNORMAL HIGH (ref 0.00–0.50)

## 2022-01-21 LAB — MAGNESIUM: Magnesium: 2.1 mg/dL (ref 1.7–2.4)

## 2022-01-21 MED ORDER — AZITHROMYCIN 500 MG PO TABS
500.0000 mg | ORAL_TABLET | Freq: Every day | ORAL | 0 refills | Status: DC
  Filled 2022-01-21: qty 5, 5d supply, fill #0

## 2022-01-21 MED ORDER — SULFAMETHOXAZOLE-TRIMETHOPRIM 800-160 MG PO TABS
ORAL_TABLET | ORAL | 0 refills | Status: DC
  Filled 2022-01-21: qty 15, 30d supply, fill #0

## 2022-01-21 MED ORDER — ASCORBIC ACID 500 MG PO TABS: 500.0000 mg | ORAL_TABLET | Freq: Every day | ORAL | Status: DC

## 2022-01-21 MED ORDER — PREDNISONE 10 MG PO TABS
ORAL_TABLET | ORAL | 0 refills | Status: DC
  Filled 2022-01-21: qty 132, 30d supply, fill #0

## 2022-01-21 NOTE — Discharge Summary (Signed)
Earl Thomas QIO:[id number] DOB: 1950/10/25 DOA: 01/18/2022  PCP: Horald Pollen, MD  Admit date: 01/18/2022  Discharge date: 01/21/2022  Admitted From: Home   Disposition:  Home   Recommendations for Outpatient Follow-up:   Follow up with PCP in 1-2 weeks  PCP Please obtain BMP/CBC, 2 view CXR in 1week,  (see Discharge instructions)   PCP Please follow up on the following pending results: Needs close outpatient pulmonary follow-up to get her prednisone and Bactrim dose adjusted.   Home Health: PT   Equipment/Devices: None  Consultations: PCCM Discharge Condition: Fair CODE STATUS: Full    Diet Recommendation: Heart Healthy   Diet Order             Diet - low sodium heart healthy           Diet regular Room service appropriate? Yes; Fluid consistency: Thin  Diet effective now                    Chief Complaint  Patient presents with   Shortness of Breath     Brief history of present illness from the day of admission and additional interim summary     71 y.o. male with medical history significant for chronic hypoxic respiratory failure on 3 L continuous nasal cannula, interstitial lung disease, rheumatoid arthritis, chronic diastolic heart failure, who was admitted to the hospital with chief complaints of dry cough and shortness of breath, work-up consistent with community-acquired pneumonia, COVID-19 infection likely acute with pneumomediastinum.  Pulmonary was consulted and he was admitted by hospitalist team.                                                                 Hospital Course     Acute on chronic hypoxic respiratory failure in a patient with history of underlying ILD on 3 L nasal cannula oxygen. Work-up in the ER consistent with community-acquired pneumonia along with  acute COVID-19 infection with CT count of 21 & pneumomediastinum.  Pulmonary was on board.  With combination of IV steroids, remdesivir, IV antibiotics for superimposed bacterial CAP.  With supportive care he has shown significant improvement and now he is at his baseline with no shortness of breath or hypoxia on 3 L nasal cannula oxygen in fact he is saturating between 99 and 100% without any symptoms.  Per pulmonary he should be discharged on prednisone taper starting at 60 mg daily with weekly taper of 10 mg, 3 times a week Bactrim along with 5 more days of oral azithromycin.  He will be discharged on this regimen with close outpatient follow-up with his pulmonary physician within 7 to 10 days.  PCP to ensure.  Request pulmonary physician to kindly titrate his Bactrim and prednisone dose further as needed.   Severe sepsis  due to #1 above.  Resolved with supportive care.  Appears nontoxic now.   Chronic diastolic CHF recent EF 16%.  Compensated.   Moderate to severe protein calorie malnutrition.  Protein supplements.   GERD.  Pepcid   ++Elevated D-dimer.  CTA & Leg [place] negative,   placed on moderate dose Lovenox for prophylaxis while in the hospital.  D-dimer due to COVID-19 inflammation , continue home dose baby aspirin.   Pneumomediastinum.  Per pulmonary mild, no further work-up   Mildly elevated LDH.  Discussed with pulmonary, likely due to ILD.  Per pulmonary no acute concern.  Discharge diagnosis     Principal Problem:   Acute on chronic respiratory failure with hypoxia (HCC) Active Problems:   COVID-19 virus infection   Severe sepsis (HCC)   Pneumomediastinum (HCC)   GERD (gastroesophageal reflux disease)   Chronic diastolic CHF (congestive heart failure) ([place])    Discharge instructions    Discharge Instructions     Diet - low sodium heart healthy   Complete by: As directed    Discharge instructions   Complete by: As directed    Follow with Primary MD Horald Pollen, MD and your pulmonary physician in 7 days.  Please get your oral prednisone dose adjusted by your pulmonary physician within 7 to 10 days.  Get CBC, CMP, 2 view Chest X ray -  checked next visit within 1 week by Primary MD   Activity: As tolerated with Full fall precautions use walker/cane & assistance as needed  Disposition Home   Diet: Heart Healthy   Special Instructions: If you have smoked or chewed Tobacco  in the last 2 yrs please stop smoking, stop any regular Alcohol  and or any Recreational drug use.  On your next visit with your primary care physician please Get Medicines reviewed and adjusted.  Please request your Prim.MD to go over all Hospital Tests and Procedure/Radiological results at the follow up, please get all Hospital records sent to your Prim MD by signing hospital release before you go home.  If you experience worsening of your admission symptoms, develop shortness of breath, life threatening emergency, suicidal or homicidal thoughts you must seek medical attention immediately by calling 911 or calling your MD immediately  if symptoms less severe.  You Must read complete instructions/literature along with all the possible adverse reactions/side effects for all the Medicines you take and that have been prescribed to you. Take any new Medicines after you have completely understood and accpet all the possible adverse reactions/side effects.   Increase activity slowly   Complete by: As directed        Discharge Medications   Allergies as of 01/21/2022   No Known Allergies      Medication List     TAKE these medications    acetaminophen 500 MG tablet Commonly known as: TYLENOL Take 1,000 mg by mouth every 6 (six) hours as needed for headache (pain).   albuterol 108 (90 Base) MCG/ACT inhaler Commonly known as: VENTOLIN HFA Inhale 2 puffs into the lungs every 6 (six) hours as needed for wheezing or shortness of breath.   aspirin EC 81 MG tablet Take  81 mg by mouth every morning.   azelastine 0.1 % nasal spray Commonly known as: ASTELIN Place 2 sprays into both nostrils 2 (two) times daily. Use in each nostril as directed What changed:  when to take this reasons to take this   azithromycin 500 MG tablet Commonly known as: Zithromax Take  1 tablet (500 mg total) by mouth daily.   cetirizine 10 MG tablet Commonly known as: ZYRTEC Take 10 mg by mouth daily.   CLEAR EYES OP Place 1 drop into both eyes daily.   diclofenac sodium 1 % Gel Commonly known as: Voltaren Apply 2 g topically 4 (four) times daily. What changed:  when to take this reasons to take this   famotidine 20 MG tablet Commonly known as: PEPCID Take 1 tablet (20 mg total) by mouth 2 (two) times daily.   folic acid 1 MG tablet Commonly known as: [name] Take 1 mg by mouth every morning.   guaifenesin 100 MG/5ML syrup Commonly known as: ROBITUSSIN Take 200 mg by mouth 3 (three) times daily as needed for cough or congestion.   levocetirizine 5 MG tablet Commonly known as: XYZAL Take 1 tablet (5 mg total) by mouth every evening.   loperamide 2 MG capsule Commonly known as: IMODIUM Take 2 mg by mouth daily as needed for diarrhea or loose stools.   Lumify 0.025 % Soln Generic drug: Brimonidine Tartrate Place 1 drop into both eyes every Sunday.   meloxicam 7.5 MG tablet Commonly known as: MOBIC Take 1 tablet (7.5 mg total) by mouth daily as needed for pain.   methotrexate 10 MG tablet Commonly known as: RHEUMATREX Take 1 tablet (10 mg total) by mouth once a week. Caution: Chemotherapy. Protect from light.   nitroGLYCERIN 0.4 MG SL tablet Commonly known as: NITROSTAT Place 1 tablet (0.4 mg total) under the tongue every 5 (five) minutes as needed. What changed: reasons to take this   predniSONE 10 MG tablet Commonly known as: DELTASONE Take 60 mg oral daily for 1 week, followed by 50 mg oral daily for 1 week, followed by 40 mg oral daily x 1 week,  followed by 30 mg oral daily, this medication will be finally titrated and stopped by a pulmonary physician.   RITUXAN IV Inject into the vein.   sulfamethoxazole-trimethoprim 800-160 MG tablet Commonly known as: BACTRIM DS 1 pill Monday Wednesday Friday, 30-day supply   Symbicort 80-4.5 MCG/ACT inhaler Generic drug: budesonide-formoterol Inhale 2 puffs by mouth twice daily What changed:  when to take this reasons to take this   vitamin C 500 MG tablet Commonly known as: ASCORBIC ACID Take 500 mg by mouth daily after supper.         Follow-up Information     Horald Pollen, MD. Schedule an appointment as soon as possible for a visit in 1 week(s).   Specialty: Internal Medicine Contact information: [place] [address] [phone]         Spero Geralds, MD. Schedule an appointment as soon as possible for a visit in 1 week(s).   Specialty: Pulmonary Disease Why: Please follow-up within the next 7 to 10 days, do not stop prednisone till you see your lung doctor, they will stop this medicine. Contact information: [address] [place] [address] [phone]                 Major procedures and Radiology Reports - PLEASE review detailed and final reports thoroughly  -       VAS [place] LOWER EXTREMITY VENOUS (DVT)  Result Date: 01/20/2022  Lower Venous DVT Study Patient Name:  TYRRELL STEPHENS  Date of Exam:   01/19/2022 Medical Rec #: [id number]             Accession #:    [id number] Date  of Birth: 1950/12/25             Patient Gender: M Patient Age:   71 years Exam Location:  [facility] Procedure:      VAS [place] LOWER EXTREMITY VENOUS (DVT) Referring Phys: Deno [facility] --------------------------------------------------------------------------------  Indications: Covid, elevated D-Dimer.  Comparison Study: No prior study on file Performing Technologist: Sharion Dove RVS  Examination Guidelines: A complete  evaluation includes B-mode imaging, spectral Doppler, color Doppler, and power Doppler as needed of all accessible portions of each vessel. Bilateral testing is considered an integral part of a complete examination. Limited examinations for reoccurring indications may be performed as noted. The reflux portion of the exam is performed with the patient in reverse Trendelenburg.  +---------+---------------+---------+-----------+----------+--------------+ RIGHT    CompressibilityPhasicitySpontaneityPropertiesThrombus Aging +---------+---------------+---------+-----------+----------+--------------+ CFV      Full           Yes      Yes                                 +---------+---------------+---------+-----------+----------+--------------+ SFJ      Full                                                        +---------+---------------+---------+-----------+----------+--------------+ FV Prox  Full                                                        +---------+---------------+---------+-----------+----------+--------------+ FV Mid   Full                                                        +---------+---------------+---------+-----------+----------+--------------+ FV DistalFull                                                        +---------+---------------+---------+-----------+----------+--------------+ PFV      Full                                                        +---------+---------------+---------+-----------+----------+--------------+ POP      Full           Yes      Yes                                 +---------+---------------+---------+-----------+----------+--------------+ PTV      Full                                                        +---------+---------------+---------+-----------+----------+--------------+  PERO     Full                                                         +---------+---------------+---------+-----------+----------+--------------+   +---------+---------------+---------+-----------+----------+--------------+ LEFT     CompressibilityPhasicitySpontaneityPropertiesThrombus Aging +---------+---------------+---------+-----------+----------+--------------+ CFV      Full           Yes      Yes                                 +---------+---------------+---------+-----------+----------+--------------+ SFJ      Full                                                        +---------+---------------+---------+-----------+----------+--------------+ FV Prox  Full                                                        +---------+---------------+---------+-----------+----------+--------------+ FV Mid   Full                                                        +---------+---------------+---------+-----------+----------+--------------+ FV DistalFull                                                        +---------+---------------+---------+-----------+----------+--------------+ PFV      Full                                                        +---------+---------------+---------+-----------+----------+--------------+ POP      Full           Yes      Yes                                 +---------+---------------+---------+-----------+----------+--------------+ PTV      Full                                                        +---------+---------------+---------+-----------+----------+--------------+ PERO     Full                                                        +---------+---------------+---------+-----------+----------+--------------+  Summary: BILATERAL: - No evidence of deep vein thrombosis seen in the lower extremities, bilaterally. -No evidence of popliteal cyst, bilaterally.   *See table(s) above for measurements and observations. Electronically signed by Harold Barban MD on 01/20/2022 at 7:17:16  PM.    Final    DG Chest Port 1 View  Result Date: 01/19/2022 CLINICAL DATA:  Dyspnea.  [id number] EXAM: PORTABLE CHEST 1 VIEW COMPARISON:  01/18/2022 FINDINGS: Lung volumes are small. Superimposed diffuse coarse interstitial pulmonary infiltrates, more prevalent at the lung bases bilaterally, are again identified in keeping with changes of underlying interstitial lung disease, better assessed on CT examination of 01/18/2022. These appears stable since immediate prior examination, though appear mildly progressed at the lung bases bilaterally since remote prior examination of 11/10/2021. This may reflect superimposed infectious or inflammatory infiltrate, again better appreciated on prior CT examination. No pneumothorax or pleural effusion. Cardiac size within normal limits. No acute bone abnormality. IMPRESSION: 1. Pulmonary hypoinflation. 2. Extensive coarse pulmonary infiltrates in keeping with underlying interstitial lung disease. 3. Superimposed diffuse interstitial pulmonary infiltrates, mildly progressed at the lung bases since remote prior examination of 11/10/2021 but stable since immediate prior examination of 01/18/2022, possibly reflecting superimposed infectious or inflammatory infiltrate. Electronically Signed   By: Fidela Salisbury M.D.   On: 01/19/2022 03:50   CT Angio Chest PE W and/or Wo Contrast  Result Date: 01/18/2022 CLINICAL DATA:  Pulmonary embolism (PE) suspected, high prob EXAM: CT ANGIOGRAPHY CHEST WITH CONTRAST TECHNIQUE: Multidetector CT imaging of the chest was performed using the standard protocol during bolus administration of intravenous contrast. Multiplanar CT image reconstructions and MIPs were obtained to evaluate the vascular anatomy. RADIATION DOSE REDUCTION: This exam was performed according to the departmental dose-optimization program which includes automated exposure control, adjustment of the mA and/or kV according to patient size and/or use of iterative reconstruction  technique. CONTRAST:  53m OMNIPAQUE IOHEXOL 350 MG/ML SOLN COMPARISON:  11/10/2021 FINDINGS: Cardiovascular: There is adequate opacification of the pulmonary arterial tree. No intraluminal filling defect identified to suggest acute pulmonary embolism. No significant coronary artery calcification. Global cardiac size within normal limits. No pericardial effusion. Central pulmonary arteries are of normal caliber. The thoracic aorta is unremarkable. Mediastinum/Nodes: There has developed moderate pneumomediastinum within the anterior mediastinum best appreciated substernally. Gas is seen tracking into the aortopulmonary groove as well as the right neck base. Visualized thyroid is unremarkable. Enhancing soft tissue within the subcarinal and bilateral hilar regions may represent reactive adenopathy. No pathologically enlarged thoracic adenopathy. Esophagus is unremarkable. Small hiatal hernia. Lungs/Pleura: There is extensive subpleural architectural distortion with honeycombing demonstrating a basilar predominance with associated traction bronchiectasis in keeping with a UIP pattern of interstitial lung disease. There has developed mild focal consolidation within the posterior basal right lower lobe which may represent superimposed acute infection in the appropriate clinical setting. More sparse infiltrate is seen diffusely throughout the right lung base, best appreciated on axial image # 58/7. No central obstructing lesion. No pneumothorax or pleural effusion. Upper Abdomen: No acute abnormality. Musculoskeletal: No acute bone abnormality. No lytic or blastic bone lesion. Review of the MIP images confirms the above findings. IMPRESSION: 1. No pulmonary embolism. 2. Interval development of moderate pneumomediastinum. No pneumothorax. 3. Interval development of focal consolidation within the posterior basal right lower lobe which may represent superimposed acute infection in the appropriate clinical setting. More  sparse infiltrate is seen diffusely throughout the right lung base. 4. UIP pattern of interstitial lung disease, grossly stable since prior examination. Findings are  consistent with UIP per consensus guidelines: Diagnosis of Idiopathic Pulmonary Fibrosis: An Official ATS/ERS/JRS/ALAT Clinical Practice Guideline. [place], Iss 5, [id number], Mar 08 2017. Electronically Signed   By: Fidela Salisbury M.D.   On: 01/18/2022 20:38   DG Chest Portable 1 View  Result Date: 01/18/2022 CLINICAL DATA:  Cough EXAM: PORTABLE CHEST 1 VIEW COMPARISON:  11/10/2021 FINDINGS: Severe chronic interstitial disease throughout the lungs compatible with severe chronic fibrosis as seen on prior plain films and CT. No definite acute areas of consolidation or effusions. Heart is normal size. No acute bony abnormality. IMPRESSION: Severe chronic lung disease/fibrosis.  No definite acute process. Electronically Signed   By: Rolm Baptise M.D.   On: 01/18/2022 18:57   MYOCARDIAL PERFUSION IMAGING  Result Date: 01/14/2022   LV perfusion is normal. There is no evidence of ischemia. There is no evidence of infarction.   Left ventricular function is normal. Nuclear stress EF: 65 %. The left ventricular ejection fraction is moderately decreased (30-44%). End diastolic cavity size is normal.   The study is normal. The study is low risk.    Micro Results     Recent Results (from the past 240 hour(s))  Blood Culture (routine x 2)     Status: None (Preliminary result)   Collection Time: 01/18/22  6:55 PM   Specimen: BLOOD RIGHT FOREARM  Result Value Ref Range Status   Specimen Description   Final    BLOOD RIGHT FOREARM BLOOD Performed at [facility] [address] [place], [place] [address]    Special Requests   Final    Blood Culture results may not be optimal due to an inadequate volume of blood received in culture bottles [place] Performed at [facility],  [address] [place], [place] [address]    Culture   Final    NO GROWTH 2 DAYS Performed at [facility], [place] [address], [place], [place] [address]    Report Status PENDING  Incomplete  Blood Culture (routine x 2)     Status: None (Preliminary result)   Collection Time: 01/18/22  7:00 PM   Specimen: BLOOD RIGHT ARM  Result Value Ref Range Status   Specimen Description   Final    BLOOD RIGHT ARM Performed at [facility], [place] [address], [place], [place] [address]    Special Requests   Final    Blood Culture adequate volume BOTTLES DRAWN AEROBIC AND ANAEROBIC Performed at [facility], [address] [place], [place] [address]    Culture   Final    NO GROWTH 2 DAYS Performed at [facility], [place] [address] [place], [place] [address]    Report Status PENDING  Incomplete  Resp Panel by RT-PCR (Flu A&B, Covid) Anterior Nasal Swab     Status: Abnormal   Collection Time: 01/18/22  8:33 PM   Specimen: Anterior Nasal Swab  Result Value Ref Range Status   SARS Coronavirus 2 by RT PCR POSITIVE (A) NEGATIVE Final    Comment: (NOTE) SARS-CoV-2 target nucleic acids are DETECTED.  The SARS-CoV-2 RNA is generally detectable in upper respiratory specimens during the acute phase of infection. Positive results are indicative of the presence of the identified virus, but do not rule out bacterial infection or co-infection with other pathogens not detected by the test. Clinical correlation with patient history and other diagnostic information is  necessary to determine patient infection status. The expected result is Negative.  Fact Sheet for Patients: EntrepreneurPulse.com.au  Fact Sheet for Healthcare Providers: IncredibleEmployment.be  This test is not yet approved or cleared by the [place] FDA and  has been authorized for detection and/or diagnosis of SARS-CoV-2 by FDA under an Emergency Use Authorization  (EUA).  This EUA will remain in effect (meaning this test can be used) for the duration of  the COVID-19 declaration under Section 564(b)(1) of the A ct, 21 U.S.C. section 360bbb-3(b)(1), unless the authorization is terminated or revoked sooner.     Influenza A by PCR NEGATIVE NEGATIVE Final   Influenza B by PCR NEGATIVE NEGATIVE Final    Comment: (NOTE) The Xpert Xpress SARS-CoV-2/FLU/RSV plus assay is intended as an aid in the diagnosis of influenza from Nasopharyngeal swab specimens and should not be used as a sole basis for treatment. Nasal washings and aspirates are unacceptable for Xpert Xpress SARS-CoV-2/FLU/RSV testing.  Fact Sheet for Patients: EntrepreneurPulse.com.au  Fact Sheet for Healthcare Providers: IncredibleEmployment.be  This test is not yet approved or cleared by the [place] FDA and has been authorized for detection and/or diagnosis of SARS-CoV-2 by FDA under an Emergency Use Authorization (EUA). This EUA will remain in effect (meaning this test can be used) for the duration of the COVID-19 declaration under Section 564(b)(1) of the Act, 21 U.S.C. section 360bbb-3(b)(1), unless the authorization is terminated or revoked.  Performed at [facility], [address], [place], [place] [address]   Urine Culture     Status: None   Collection Time: 01/18/22  8:34 PM   Specimen: In/Out Cath Urine  Result Value Ref Range Status   Specimen Description   Final    IN/OUT CATH URINE Performed at [facility], [address] [place], [place] [address]    Special Requests   Final    NONE Performed at [facility], [address] [place], [place] [address]    Culture   Final    NO GROWTH Performed at [facility], [place] [address], [place], [place] [address]    Report Status 01/20/2022 FINAL  Final  MRSA Next Gen by PCR, Nasal     Status: None   Collection Time: 01/19/22  8:34 AM    Specimen: Nasal Mucosa; Nasal Swab  Result Value Ref Range Status   MRSA by PCR Next Gen NOT DETECTED NOT DETECTED Final    Comment: (NOTE) The GeneXpert MRSA Assay (FDA approved for NASAL specimens only), is one component of a comprehensive MRSA colonization surveillance program. It is not intended to diagnose MRSA infection nor to guide or monitor treatment for MRSA infections. Test performance is not FDA approved in patients less than 59 years old. Performed at [facility], [place] [address] [place], [place] [address]     Today   Subjective    Earl Thomas today has no headache,no chest abdominal pain,no new weakness tingling or numbness, feels much better wants to go home today.     Objective   Blood pressure 109/67, pulse 64, temperature 98.4 F (36.9 C), temperature source Oral, resp. rate (!) 28, height _0  (1.651 m), weight 47.1 kg, SpO2 100 %.   Intake/Output Summary (Last 24 hours) at 01/21/2022 0901 Last data filed at 01/21/2022 0053 Gross per 24 hour  Intake 590 ml  Output 475 ml  Net 115 ml    Exam  Awake Alert,  No new F.N deficits,    [place].AT,PERRAL Supple Neck,   Symmetrical Chest wall movement, Good air movement bilaterally, CTAB RRR,No Gallops,   +ve B.Sounds, Abd Soft, Non tender,  No Cyanosis, Clubbing or edema    Data Review   Recent Labs  Lab 01/18/22 1855 01/19/22 0307 01/20/22 0445 01/21/22 0512  WBC 10.0 7.0 10.4 12.9*  HGB 12.3* 11.3* 10.8* 10.8*  HCT 38.8* 35.4* 33.6* 33.1*  PLT 211 205 200 205  MCV 85.8 84.3 85.1 84.2  MCH 27.2 26.9 27.3 27.5  MCHC 31.7 31.9 32.1 32.6  RDW 13.6 13.6 13.3 13.4  LYMPHSABS 1.0 0.4* 0.7 0.8  MONOABS 0.7 0.1 0.3 0.5  EOSABS 0.2 0.0 0.0 0.0  BASOSABS 0.0 0.0 0.0 0.0    Recent Labs  Lab 01/18/22 1855 01/19/22 0052 01/19/22 0307 01/19/22 0450 01/20/22 0445 01/21/22 0512 01/21/22 0515  NA 135  --  140  --  140 142  --   K 3.8  --  4.3  --  3.9 3.8  --   CL 101  --  103  --  104 109   --   CO2 27  --  25  --  25 27  --   GLUCOSE 123*  --  125*  --  146* 127*  --   BUN 16  --  9  --  14 18  --   CREATININE 1.03  --  0.92  --  0.73 0.88  --   CALCIUM 8.4*  --  8.3*  --  7.8* 8.0*  --   AST  --   --  34  --  27 41  --   ALT  --   --  18  --  16 28  --   ALKPHOS  --   --  46  --  42 41  --   BILITOT  --   --  0.6  --  <0.1* 0.2*  --   ALBUMIN  --   --  2.4*  --  2.3* 2.2*  --   MG  --   --  1.8  --  2.0 2.1  --   PHOS  --   --  1.9*  --   --   --   --   CRP  --   --   --  17.3* 9.8* 4.9*  --   DDIMER  --  13.34*  --   --  11.90* 13.32*  --   PROCALCITON  --   --  1.03  --   --   --   --   LATICACIDVEN 1.9  --   --   --   --   --   --   INR  --  1.1  --   --   --   --   --   BNP  --   --  28.2  --  83.7  --  151.3*    Total Time in preparing paper work, data evaluation and todays exam - 35 minutes  Lala Lund M.D on 01/21/2022 at 9:01 AM  [facility]

## 2022-01-21 NOTE — Discharge Instructions (Addendum)
Follow with Primary MD Horald Pollen, MD and your pulmonary physician in 7 days   Please follow-up with your pulmonary doctor within 7 to 10 days and get prednisone dose adjusted.  Get CBC, CMP, 2 view Chest X ray -  checked next visit within 1 week by Primary MD   Activity: As tolerated with Full fall precautions use walker/cane & assistance as needed  Disposition Home   Diet: Heart Healthy   Special Instructions: If you have smoked or chewed Tobacco  in the last 2 yrs please stop smoking, stop any regular Alcohol  and or any Recreational drug use.  On your next visit with your primary care physician please Get Medicines reviewed and adjusted.  Please request your Prim.MD to go over all Hospital Tests and Procedure/Radiological results at the follow up, please get all Hospital records sent to your Prim MD by signing hospital release before you go home.  If you experience worsening of your admission symptoms, develop shortness of breath, life threatening emergency, suicidal or homicidal thoughts you must seek medical attention immediately by calling 911 or calling your MD immediately  if symptoms less severe.  You Must read complete instructions/literature along with all the possible adverse reactions/side effects for all the Medicines you take and that have been prescribed to you. Take any new Medicines after you have completely understood and accpet all the possible adverse reactions/side effects.

## 2022-01-21 NOTE — TOC Transition Note (Signed)
Transition of Care [facility]) - CM/SW Discharge Note   Patient Details  Name: Tabitha Tupper MRN: [id number] Date of Birth: Aug 31, 1950  Transition of Care [facility]) CM/SW Contact:  Zenon Mayo, RN Phone Number: 01/21/2022, 10:06 AM   Clinical Narrative:    Patient is for dc today, NCM offered choice, he does not have a preference,  NCM made referral to [facility] with [facility]. He is able to take referral, soc will start 24 to 48 hrs post dc.  Wife is at bedside to transport home.    Final next level of care: [place] Barriers to Discharge: No Barriers Identified   Patient Goals and CMS Choice Patient states their goals for this hospitalization and ongoing recovery are:: return home with [facility] CMS Medicare.gov Compare Post Acute Care list provided to:: Patient Choice offered to / list presented to : Patient  Discharge Placement                       Discharge Plan and Services                  DME Agency: NA       HH Arranged: PT, OT HH Agency: [place] Date [facility] Agency Contacted: 01/21/22 Time [place]: 1006 Representative spoke with at [place]: [place] Determinants of Health ([place]) Interventions     Readmission Risk Interventions     No data to display

## 2022-01-22 ENCOUNTER — Telehealth: Payer: Self-pay

## 2022-01-22 NOTE — Telephone Encounter (Signed)
Transition Care Management Follow-up Telephone Call Date of discharge and from where: 01/21/2022 from [facility] Dx: Pneumonia How have you been since you were released from the hospital? "Feeling good, trying to adjust, but keep moving" Any questions or concerns? No  Items Reviewed: Did the pt receive and understand the discharge instructions provided? Yes  Medications obtained and verified? Yes  Other? No  Any new allergies since your discharge? No  Dietary orders reviewed? Yes; Heart Healthy Diet Do you have support at home? Yes   Home Care and Equipment/Supplies: Were home health services ordered? yes If so, what is the name of the agency? [facility] (OT, PT)  Has the agency set up a time to come to the patient's home? No; agency will call Were any new equipment or medical supplies ordered?  No What is the name of the medical supply agency? no Were you able to get the supplies/equipment? not applicable Do you have any questions related to the use of the equipment or supplies? No  Functional Questionnaire: (I = Independent and D = Dependent) ADLs: I  Bathing/Dressing- I  Meal Prep- I  Eating- I  Maintaining continence- I  Transferring/Ambulation- I  Managing Meds- I  Follow up appointments reviewed:  PCP Hospital f/u appt confirmed? Yes  Scheduled to see Agustina Caroli, MD on 01/28/2022 @ 9:40 am. [facility] Aph f/u appt confirmed? Yes  Scheduled to see [facility] on 01/31/2022 @ 11:00 am. Are transportation arrangements needed? No  If their condition worsens, is the pt aware to call PCP or go to the Emergency Dept.? Yes Was the patient provided with contact information for the PCP's office or ED? Yes Was to pt encouraged to call back with questions or concerns? Yes

## 2022-01-23 DIAGNOSIS — J9621 Acute and chronic respiratory failure with hypoxia: Secondary | ICD-10-CM | POA: Diagnosis not present

## 2022-01-23 DIAGNOSIS — Z87891 Personal history of nicotine dependence: Secondary | ICD-10-CM | POA: Diagnosis not present

## 2022-01-23 DIAGNOSIS — E43 Unspecified severe protein-calorie malnutrition: Secondary | ICD-10-CM | POA: Diagnosis not present

## 2022-01-23 DIAGNOSIS — K219 Gastro-esophageal reflux disease without esophagitis: Secondary | ICD-10-CM | POA: Diagnosis not present

## 2022-01-23 DIAGNOSIS — Z792 Long term (current) use of antibiotics: Secondary | ICD-10-CM | POA: Diagnosis not present

## 2022-01-23 DIAGNOSIS — U071 COVID-19: Secondary | ICD-10-CM | POA: Diagnosis not present

## 2022-01-23 DIAGNOSIS — R5382 Chronic fatigue, unspecified: Secondary | ICD-10-CM | POA: Diagnosis not present

## 2022-01-23 DIAGNOSIS — I110 Hypertensive heart disease with heart failure: Secondary | ICD-10-CM | POA: Diagnosis not present

## 2022-01-23 DIAGNOSIS — Z7951 Long term (current) use of inhaled steroids: Secondary | ICD-10-CM | POA: Diagnosis not present

## 2022-01-23 DIAGNOSIS — Z7952 Long term (current) use of systemic steroids: Secondary | ICD-10-CM | POA: Diagnosis not present

## 2022-01-23 DIAGNOSIS — E785 Hyperlipidemia, unspecified: Secondary | ICD-10-CM | POA: Diagnosis not present

## 2022-01-23 DIAGNOSIS — Z9981 Dependence on supplemental oxygen: Secondary | ICD-10-CM | POA: Diagnosis not present

## 2022-01-23 DIAGNOSIS — M26629 Arthralgia of temporomandibular joint, unspecified side: Secondary | ICD-10-CM | POA: Diagnosis not present

## 2022-01-23 DIAGNOSIS — I700 Atherosclerosis of aorta: Secondary | ICD-10-CM | POA: Diagnosis not present

## 2022-01-23 DIAGNOSIS — I5032 Chronic diastolic (congestive) heart failure: Secondary | ICD-10-CM | POA: Diagnosis not present

## 2022-01-23 DIAGNOSIS — Z7982 Long term (current) use of aspirin: Secondary | ICD-10-CM | POA: Diagnosis not present

## 2022-01-23 DIAGNOSIS — M1990 Unspecified osteoarthritis, unspecified site: Secondary | ICD-10-CM | POA: Diagnosis not present

## 2022-01-23 DIAGNOSIS — J8409 Other alveolar and parieto-alveolar conditions: Secondary | ICD-10-CM | POA: Diagnosis not present

## 2022-01-23 DIAGNOSIS — J1282 Pneumonia due to coronavirus disease 2019: Secondary | ICD-10-CM | POA: Diagnosis not present

## 2022-01-23 DIAGNOSIS — M0510 Rheumatoid lung disease with rheumatoid arthritis of unspecified site: Secondary | ICD-10-CM | POA: Diagnosis not present

## 2022-01-23 DIAGNOSIS — J982 Interstitial emphysema: Secondary | ICD-10-CM | POA: Diagnosis not present

## 2022-01-23 DIAGNOSIS — N289 Disorder of kidney and ureter, unspecified: Secondary | ICD-10-CM | POA: Diagnosis not present

## 2022-01-23 DIAGNOSIS — R7302 Impaired glucose tolerance (oral): Secondary | ICD-10-CM | POA: Diagnosis not present

## 2022-01-23 DIAGNOSIS — J440 Chronic obstructive pulmonary disease with acute lower respiratory infection: Secondary | ICD-10-CM | POA: Diagnosis not present

## 2022-01-23 DIAGNOSIS — Z9181 History of falling: Secondary | ICD-10-CM | POA: Diagnosis not present

## 2022-01-23 LAB — CULTURE, BLOOD (ROUTINE X 2)
Culture: NO GROWTH
Culture: NO GROWTH
Special Requests: ADEQUATE

## 2022-01-24 ENCOUNTER — Telehealth: Payer: Self-pay | Admitting: Emergency Medicine

## 2022-01-24 NOTE — Telephone Encounter (Signed)
Notified Judi Saa. MD response.Marland KitchenJohny Chess

## 2022-01-24 NOTE — Telephone Encounter (Signed)
Christine the physical therapist from [facility] is requesting Verbal Orders for physical therapy for 2 times a week for 2 weeks. And then 1 time a week for 4 weeks.   Please advise.  CB: [phone] Ok to leave a message.

## 2022-01-24 NOTE — Telephone Encounter (Signed)
Okay to provide orders as requested.  Thanks.

## 2022-01-25 DIAGNOSIS — J8409 Other alveolar and parieto-alveolar conditions: Secondary | ICD-10-CM | POA: Diagnosis not present

## 2022-01-25 DIAGNOSIS — J1282 Pneumonia due to coronavirus disease 2019: Secondary | ICD-10-CM | POA: Diagnosis not present

## 2022-01-25 DIAGNOSIS — J9621 Acute and chronic respiratory failure with hypoxia: Secondary | ICD-10-CM | POA: Diagnosis not present

## 2022-01-25 DIAGNOSIS — U071 COVID-19: Secondary | ICD-10-CM | POA: Diagnosis not present

## 2022-01-25 DIAGNOSIS — J440 Chronic obstructive pulmonary disease with acute lower respiratory infection: Secondary | ICD-10-CM | POA: Diagnosis not present

## 2022-01-25 DIAGNOSIS — J982 Interstitial emphysema: Secondary | ICD-10-CM | POA: Diagnosis not present

## 2022-01-28 ENCOUNTER — Ambulatory Visit (INDEPENDENT_AMBULATORY_CARE_PROVIDER_SITE_OTHER): Payer: Medicare Other | Admitting: Emergency Medicine

## 2022-01-28 ENCOUNTER — Ambulatory Visit (INDEPENDENT_AMBULATORY_CARE_PROVIDER_SITE_OTHER): Payer: Medicare Other

## 2022-01-28 ENCOUNTER — Encounter: Payer: Self-pay | Admitting: Emergency Medicine

## 2022-01-28 VITALS — BP 130/72 | HR 111 | Temp 98.0°F | Ht 65.0 in | Wt 98.4 lb

## 2022-01-28 DIAGNOSIS — I5032 Chronic diastolic (congestive) heart failure: Secondary | ICD-10-CM | POA: Diagnosis not present

## 2022-01-28 DIAGNOSIS — J849 Interstitial pulmonary disease, unspecified: Secondary | ICD-10-CM | POA: Diagnosis not present

## 2022-01-28 DIAGNOSIS — Z09 Encounter for follow-up examination after completed treatment for conditions other than malignant neoplasm: Secondary | ICD-10-CM | POA: Diagnosis not present

## 2022-01-28 DIAGNOSIS — E785 Hyperlipidemia, unspecified: Secondary | ICD-10-CM

## 2022-01-28 DIAGNOSIS — M0510 Rheumatoid lung disease with rheumatoid arthritis of unspecified site: Secondary | ICD-10-CM | POA: Diagnosis not present

## 2022-01-28 DIAGNOSIS — J189 Pneumonia, unspecified organism: Secondary | ICD-10-CM | POA: Diagnosis not present

## 2022-01-28 DIAGNOSIS — J982 Interstitial emphysema: Secondary | ICD-10-CM | POA: Diagnosis not present

## 2022-01-28 LAB — CBC WITH DIFFERENTIAL/PLATELET
Basophils Absolute: 0 10*3/uL (ref 0.0–0.1)
Basophils Relative: 0.2 % (ref 0.0–3.0)
Eosinophils Absolute: 0.2 10*3/uL (ref 0.0–0.7)
Eosinophils Relative: 1.3 % (ref 0.0–5.0)
HCT: 41.8 % (ref 39.0–52.0)
Hemoglobin: 13.2 g/dL (ref 13.0–17.0)
Lymphocytes Relative: 6.3 % — ABNORMAL LOW (ref 12.0–46.0)
Lymphs Abs: 0.8 10*3/uL (ref 0.7–4.0)
MCHC: 31.7 g/dL (ref 30.0–36.0)
MCV: 84.5 fl (ref 78.0–100.0)
Monocytes Absolute: 0.6 10*3/uL (ref 0.1–1.0)
Monocytes Relative: 4.7 % (ref 3.0–12.0)
Neutro Abs: 10.8 10*3/uL — ABNORMAL HIGH (ref 1.4–7.7)
Neutrophils Relative %: 87.5 % — ABNORMAL HIGH (ref 43.0–77.0)
Platelets: 266 10*3/uL (ref 150.0–400.0)
RBC: 4.94 Mil/uL (ref 4.22–5.81)
RDW: 14.6 % (ref 11.5–15.5)
WBC: 12.4 10*3/uL — ABNORMAL HIGH (ref 4.0–10.5)

## 2022-01-28 LAB — COMPREHENSIVE METABOLIC PANEL
ALT: 72 U/L — ABNORMAL HIGH (ref 0–53)
AST: 42 U/L — ABNORMAL HIGH (ref 0–37)
Albumin: 3.7 g/dL (ref 3.5–5.2)
Alkaline Phosphatase: 49 U/L (ref 39–117)
BUN: 25 mg/dL — ABNORMAL HIGH (ref 6–23)
CO2: 34 mEq/L — ABNORMAL HIGH (ref 19–32)
Calcium: 9.8 mg/dL (ref 8.4–10.5)
Chloride: 97 mEq/L (ref 96–112)
Creatinine, Ser: 1.02 mg/dL (ref 0.40–1.50)
GFR: 75.37 mL/min (ref >60.00)
Glucose, Bld: 120 mg/dL — ABNORMAL HIGH (ref 70–99)
Potassium: 4.2 mEq/L (ref 3.5–5.1)
Sodium: 137 mEq/L (ref 135–145)
Total Bilirubin: 0.4 mg/dL (ref 0.2–1.2)
Total Protein: 7.1 g/dL (ref 6.0–8.3)

## 2022-01-28 NOTE — Assessment & Plan Note (Signed)
Much improved.  Finished course of antibiotics.

## 2022-01-28 NOTE — Assessment & Plan Note (Signed)
No signs or symptoms of acute CHF Euvolemic Wt Readings from Last 3 Encounters:  01/28/22 98 lb 6 oz (44.6 kg)  01/21/22 103 lb 14.4 oz (47.1 kg)  01/16/22 103 lb 6.4 oz (46.9 kg)

## 2022-01-28 NOTE — Patient Instructions (Signed)
Pulmonary Fibrosis  Pulmonary fibrosis is a type of lung disease that causes scarring. Over time, the scar tissue builds up in the air sacs of your lungs (alveoli). This makes it hard for you to breathe because less oxygen gets into your bloodstream. Scarring from pulmonary fibrosis is permanent and may lead to other serious health problems. What are the causes? There are many different causes of pulmonary fibrosis. In some cases, the cause is not known. This is called idiopathic pulmonary fibrosis. Other causes include: Exposure to chemicals and substances found in agricultural, farm, Architect, or factory work. These include mold, asbestos, silica, metal dusts, and toxic fumes. Sarcoidosis. In this disease, areas of inflammatory cells (granulomas) form and most often affect the lungs. Autoimmune diseases. These include diseases such as rheumatoid arthritis, systemic sclerosis, or connective tissue disease. Taking certain medicines. These include drugs used in radiation therapy or used to treat seizures, heart problems, and some infections. What increases the risk? You are more likely to develop this condition if: You have a family history of the disease. You are an older person. The condition is more common in older adults. You have a history of smoking. You have a job that exposes you to certain chemicals. You have gastroesophageal reflux disease (GERD). What are the signs or symptoms? Symptoms of this condition include: Difficulty breathing that gets worse with activity. Shortness of breath (dyspnea). Dry, hacking cough. Rapid, shallow breathing during exercise or while at rest. Other symptoms may include: Loss of appetite or weight loss Tiredness (fatigue) or weakness. Bluish skin and lips. Rounded and enlarged fingertips (clubbing). How is this diagnosed? This condition may be diagnosed based on: Your symptoms and medical history. A physical exam. You may also have tests,  including: A test that involves looking inside your lungs with an instrument (bronchoscopy). Imaging studies of your lungs and heart. Tests to measure how well you are breathing (pulmonary function tests). Blood tests. Tests to see how well your lungs work while you are walking (pulmonary stress test). A procedure to remove a lung tissue sample to look at it under a microscope (biopsy). How is this treated? There is no cure for pulmonary fibrosis. Treatment focuses on managing symptoms and preventing scarring from getting worse. This may include: Medicines, such as: Steroids to prevent permanent lung changes. Medicines to suppress your body's defense system (immune system). Medicines to help with lung function by reducing inflammation or scarring. Ongoing monitoring with X-rays and lab work. Oxygen therapy. Pulmonary rehabilitation. Surgery. In some cases, a lung transplant is possible. Follow these instructions at home:  Medicines Take over-the-counter and prescription medicines only as told by your health care provider. Keep your vaccinations up to date as recommended by your health care provider. Activity Get regular exercise, but do not pick activities that are too strenuous for you. Ask your health care provider what activities are safe for you. If you have physical limitations, you may get exercise by walking, using a stationary bike, or doing chair exercises. Ask your health care provider about using oxygen while exercising. Do breathing exercises as told by your health care provider. Plan rest periods when you get tired. General instructions Do not use any products that contain nicotine or tobacco. These products include cigarettes, chewing tobacco, and vaping devices, such as e-cigarettes. If you need help quitting, ask your health care provider. If you are exposed to chemicals and substances at work, make sure that you wear a mask or respirator at all times. Learn to  manage  stress. If you need help to do this, ask your health care provider. Join a pulmonary rehabilitation program or a support group for people with pulmonary fibrosis. Eat small meals often so you do not get too full. Overeating can make breathing trouble worse. Maintain a healthy weight. Lose weight if you need to. Keep all follow-up visits. This is important. Where to find more information American Lung Association: www.lung.org National Heart, Lung, and Blood Institute: https://wilson-eaton.com/ Pulmonary Fibrosis Foundation: pulmonaryfibrosis.org Contact a health care provider if: You have symptoms that do not get better with medicines. You are not able to be as active as usual. You have trouble taking a deep breath. You have a fever or chills. You have blue lips or skin. You have a lot of headaches. You cough up mucus that is dark in color. You have feelings of depression or sadness. You are unable to sleep because it is hard to breathe. Get help right away if: Your symptoms suddenly worsen. You have chest pain. You cough up blood. You get very confused or sleepy. These symptoms may be an emergency. Get help right away. Call 911. Do not wait to see if the symptoms will go away. Do not drive yourself to the hospital. Summary Pulmonary fibrosis is a type of lung disease that causes scar tissue to build up in the air sacs of your lungs (alveoli) over time. This makes it hard for you to breathe because less oxygen gets into your bloodstream. Scarring from pulmonary fibrosis is permanent and may lead to other serious health problems. You are more likely to develop this condition if you have a family history of the condition or a job that exposes you to certain chemicals. There is no cure for pulmonary fibrosis. Treatment focuses on managing symptoms and preventing scarring from getting worse. This information is not intended to replace advice given to you by your health care provider. Make sure  you discuss any questions you have with your health care provider. Document Revised: 02/13/2021 Document Reviewed: 02/13/2021 Elsevier Patient Education  [place].

## 2022-01-28 NOTE — Assessment & Plan Note (Signed)
Not visible on today's x-ray.

## 2022-01-28 NOTE — Assessment & Plan Note (Signed)
Stable.  Pulmonary follow-up as scheduled Continue inhalers as prescribed.

## 2022-01-28 NOTE — Progress Notes (Signed)
Earl Thomas 71 y.o.   Chief Complaint  Patient presents with   Hospitalization Follow-up   Diarrhea    Patient still having a little bit of diarrhea    HISTORY OF PRESENT ILLNESS: This is a 71 y.o. male here for hospital discharge follow-up visit. Admitted on 01/18/2022 and released 01/21/2022. Was admitted with bacterial and COVID-pneumonia Respiratory failure.  Presently on oxygen.  Helping a lot. Feels much better today. Has no complaints or medical concerns. Has pulmonary follow-up appointment next Thursday. Slight diarrhea much better than previous days. Earl Thomas [id number] DOB: 1950-12-21 DOA: 01/18/2022   PCP: Horald Pollen, MD   Admit date: 01/18/2022  Discharge date: 01/21/2022   Admitted From: Home   Disposition:  Home     Recommendations for Outpatient Follow-up:    Follow up with PCP in 1-2 weeks   PCP Please obtain BMP/CBC, 2 view CXR in 1week,  (see Discharge instructions)    PCP Please follow up on the following pending results: Needs close outpatient pulmonary follow-up to get her prednisone and Bactrim dose adjusted.     Home Health: PT   Equipment/Devices: None  Consultations: PCCM Discharge Condition: Fair CODE STATUS: Full    Diet Recommendation: Heart Healthy    Diet Order                  Diet - low sodium heart healthy             Diet regular Room service appropriate? Yes; Fluid consistency: Thin  Diet effective now                            Chief Complaint  Patient presents with   Shortness of Breath      Brief history of present illness from the day of admission and additional interim summary      71 y.o. male with medical history significant for chronic hypoxic respiratory failure on 3 L continuous nasal cannula, interstitial lung disease, rheumatoid arthritis, chronic diastolic heart failure, who was admitted to the hospital with chief complaints of dry cough and shortness of breath, work-up  consistent with community-acquired pneumonia, COVID-19 infection likely acute with pneumomediastinum.  Pulmonary was consulted and he was admitted by hospitalist team.                                                                  Hospital Course      Acute on chronic hypoxic respiratory failure in a patient with history of underlying ILD on 3 L nasal cannula oxygen. Work-up in the ER consistent with community-acquired pneumonia along with acute COVID-19 infection with CT count of 21 & pneumomediastinum.  Pulmonary was on board.  With combination of IV steroids, remdesivir, IV antibiotics for superimposed bacterial CAP.  With supportive care he has shown significant improvement and now he is at his baseline with no shortness of breath or hypoxia on 3 L nasal cannula oxygen in fact he is saturating between 99 and 100% without any symptoms.  Per pulmonary he should be discharged on prednisone taper starting at 60 mg daily with weekly taper of 10 mg, 3 times a week Bactrim along with 5 more days of oral azithromycin.  He will be discharged on this regimen with close outpatient follow-up with his pulmonary physician within 7 to 10 days.  PCP to ensure.  Request pulmonary physician to kindly titrate his Bactrim and prednisone dose further as needed.   Severe sepsis due to #1 above.  Resolved with supportive care.  Appears nontoxic now.   Chronic diastolic CHF recent EF 75%.  Compensated.   Moderate to severe protein calorie malnutrition.  Protein supplements.   GERD.  Pepcid   ++Elevated D-dimer.  CTA & Leg [place] negative,   placed on moderate dose Lovenox for prophylaxis while in the hospital.  D-dimer due to COVID-19 inflammation , continue home dose baby aspirin.   Pneumomediastinum.  Per pulmonary mild, no further work-up   Mildly elevated LDH.  Discussed with pulmonary, likely due to ILD.  Per pulmonary no acute concern.   Discharge diagnosis       Principal Problem:   Acute on chronic  respiratory failure with hypoxia (HCC) Active Problems:   COVID-19 virus infection   Severe sepsis (HCC)   Pneumomediastinum (HCC)   GERD (gastroesophageal reflux disease)   Chronic diastolic CHF (congestive heart failure) (HCC)      Diarrhea  Pertinent negatives include no abdominal pain, chills, coughing, fever, headaches or vomiting.     Prior to Admission medications   Medication Sig Start Date End Date Taking? Authorizing Provider  acetaminophen (TYLENOL) 500 MG tablet Take 1,000 mg by mouth every 6 (six) hours as needed for headache (pain).   Yes [provider]  albuterol (VENTOLIN HFA) 108 (90 Base) MCG/ACT inhaler Inhale 2 puffs into the lungs every 6 (six) hours as needed for wheezing or shortness of breath. 07/19/21  Yes Spero Geralds, MD  aspirin EC 81 MG tablet Take 81 mg by mouth every morning.   Yes [provider]  azelastine (ASTELIN) 0.1 % nasal spray Place 2 sprays into both nostrils 2 (two) times daily. Use in each nostril as directed Patient taking differently: Place 2 sprays into both nostrils 2 (two) times daily as needed for rhinitis or allergies (congestion). Use in each nostril as directed 12/25/21  Yes Roney Marion, MD  Brimonidine Tartrate (LUMIFY) 0.025 % SOLN Place 1 drop into both eyes every Sunday.   Yes [provider]  diclofenac sodium (VOLTAREN) 1 % GEL Apply 2 g topically 4 (four) times daily. Patient taking differently: Apply 2 g topically 4 (four) times daily as needed (pain). 09/18/18  Yes Forrest Moron, MD  famotidine (PEPCID) 20 MG tablet Take 1 tablet (20 mg total) by mouth 2 (two) times daily. 12/25/21  Yes Roney Marion, MD  folic acid ([name]) 1 MG tablet Take 1 mg by mouth every morning. 08/27/19  Yes [provider]  guaifenesin (ROBITUSSIN) 100 MG/5ML syrup Take 200 mg by mouth 3 (three) times daily as needed for cough or congestion.   Yes [provider]  levocetirizine (XYZAL) 5 MG  tablet Take 1 tablet (5 mg total) by mouth every evening. 12/25/21  Yes Roney Marion, MD  loperamide (IMODIUM) 2 MG capsule Take 2 mg by mouth daily as needed for diarrhea or loose stools.   Yes [provider]  meloxicam (MOBIC) 7.5 MG tablet Take 1 tablet (7.5 mg total) by mouth daily as needed for pain. 01/16/22  Yes Spero Geralds, MD  Naphazoline HCl (CLEAR EYES OP) Place 1 drop into both eyes daily.   Yes [provider]  nitroGLYCERIN (NITROSTAT) 0.4 MG SL tablet  Place 1 tablet (0.4 mg total) under the tongue every 5 (five) minutes as needed. Patient taking differently: Place 0.4 mg under the tongue every 5 (five) minutes as needed for chest pain. 12/27/21 03/27/22 Yes Revankar, Reita Cliche, MD  predniSONE (DELTASONE) 10 MG tablet Take 6 tabs (47m) by mouth daily for 1 week, followed by 5 tabs daily for 1 week, then 4 tabs daily for 1 week, then 3 tabs daily, Pulmonary physician to titrate. 01/21/22  Yes SThurnell Lose MD  riTUXimab (RITUXAN IV) Inject into the vein.   Yes [provider]  sulfamethoxazole-trimethoprim (BACTRIM DS) 800-160 MG tablet Take 1 pill on Monday, Wednesday, and Friday of each week 01/21/22  Yes SThurnell Lose MD  SYMBICORT 80-4.5 MCG/ACT inhaler Inhale 2 puffs by mouth twice daily Patient taking differently: Inhale 2 puffs into the lungs 2 (two) times daily as needed (shortness of breath/wheezing). 11/28/21  Yes Adon Gehlhausen, [name]  vitamin C (ASCORBIC ACID) 500 MG tablet Take 500 mg by mouth daily after supper.   Yes [provider]  azithromycin (ZITHROMAX) 500 MG tablet Take 1 tablet (500 mg total) by mouth daily. Patient not taking: Reported on 01/28/2022 01/21/22   SThurnell Lose MD  cetirizine (ZYRTEC) 10 MG tablet Take 10 mg by mouth daily. Patient not taking: Reported on 01/20/2022    [provider]  methotrexate (RHEUMATREX) 10 MG tablet Take 1 tablet (10 mg total) by mouth once a week. Caution:  Chemotherapy. Protect from light. Patient not taking: Reported on 01/20/2022 01/16/22   DSpero [name]    No Known Allergies  Patient Active Problem List   Diagnosis Date Noted   Acute on chronic respiratory failure with hypoxia ([facility] 01/19/2022   COVID-19 virus infection 01/19/2022   Severe sepsis ([facility] 01/19/2022   Pneumomediastinum ([facility] 01/19/2022   GERD (gastroesophageal reflux disease) 01/19/2022   Chronic diastolic CHF (congestive heart failure) ([place] 01/19/2022   Community acquired pneumonia 01/18/2022   Chest pain of uncertain etiology 029/[id number]/[id number]  Arthritis 12/25/2021   Hyperlipidemia 12/25/2021   Acute interstitial pneumonitis ([facility] 12/04/2021   Chronic fatigue syndrome 12/04/2021   Other long term (current) drug therapy 12/04/2021   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   Arthralgia of temporomandibular joint 12/04/2021   Abnormal weight loss 12/04/2021   Family history of prostate cancer 12/04/2021   Aortic atherosclerosis ([facility] 12/04/2021   Skin lesion of chest wall 12/04/2021   Interstitial lung disease ([facility] 07/16/2019   Rheumatoid lung disease with rheumatoid arthritis (HL[place] 07/16/2019   Long-term current use of high risk medication other than anticoagulant 07/16/2019   BMI 26.0-26.9,adult 10/22/2016   Renal insufficiency 04/29/2016   Glucose intolerance (impaired glucose tolerance) 04/29/2016   Dyslipidemia 03/10/2012   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    Past Medical History:  Diagnosis Date   Abnormal weight loss 12/04/2021   Acute interstitial pneumonitis (HR[place] 12/04/2021   Aortic atherosclerosis (HL[place] 12/04/2021   Arthralgia of temporomandibular joint 12/04/2021   Arthritis    BMI 26.0-26.9,adult 10/22/2016   Chronic fatigue syndrome 12/04/2021   Dyslipidemia 03/10/2012   Family history of prostate cancer 12/04/2021   Glucose intolerance (impaired glucose tolerance)    Hyperlipidemia    Interstitial lung disease ([facility] 07/16/2019    Long-term current use of high risk medication other than anticoagulant 07/16/2019   Other long term (current) drug therapy 12/04/2021   Parietoalveolar pneumopathy (HB[place] 12/04/2021   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   Renal  insufficiency 04/29/2016   Rheumatoid lung disease with rheumatoid arthritis ([place]) 07/16/2019   Skin lesion of chest wall 12/04/2021    Past Surgical History:  Procedure Laterality Date   ROTATOR CUFF REPAIR Right 01/02/16    Social History   Socioeconomic History   Marital status: Married    Spouse name: Not on file   Number of children: 2   Years of education: Not on file   Highest education level: Not on file  Occupational History   Occupation: Professor  Tobacco Use   Smoking status: Former    Types: Pipe    Quit date: 10/06/1988    Years since quitting: 33.3   Smokeless tobacco: Never   Tobacco comments:    parent smoked in home as a child.  Smoked a pip in college, not daily.  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      Tobacco: pipe in 1980s      Alcohol: none      Exercise: joined [employer]; going 2-3 times per week.        Seatbelt:  100%   Social Determinants of Health   Financial Resource Strain: Low Risk  (11/19/2021)   Overall Financial Resource Strain (CARDIA)    Difficulty of Paying Living Expenses: Not hard at all  Food Insecurity: No Food Insecurity (11/19/2021)   Hunger Vital Sign    Worried About Running Out of Food in the Last Year: Never true    Ran Out of Food in the  Last Year: Never true  Transportation Needs: No Transportation Needs (11/19/2021)   PRAPARE - Hydrologist (Medical): No    Lack of Transportation (Non-Medical): No  Physical Activity: Not on file  Stress: No Stress Concern Present (11/19/2021)   [place]    Feeling of Stress : Not at all  Social Connections: [place] (11/19/2021)   Social Connection and Isolation Panel [NHANES]    Frequency of Communication with Friends and Family: More than three times a week    Frequency of Social Gatherings with Friends and Family: More than three times a week    Attends Religious Services: More than 4 times per year    Active Member of [employer] or Organizations: Yes    Attends Music therapist: More than 4 times per year    Marital Status: Married  Human resources officer Violence: Not At Risk (11/19/2021)   Humiliation, Afraid, Rape, and Kick questionnaire    Fear of Current or Ex-Partner: No    Emotionally Abused: No    Physically Abused: No    Sexually Abused: No    Family History  Problem Relation Age of Onset   Cancer Father 13       prostate cancer   Diabetes Father    Heart disease Father 61       AMI late 36s   Cancer Mother 49  Breast cancer   Heart disease Mother        CABG at age 69   Diabetes Brother    Heart disease Brother        AMI x 2; CABG   Hyperlipidemia Brother      Review of Systems  Constitutional: Negative.  Negative for chills and fever.  HENT: Negative.  Negative for congestion and sore throat.   Respiratory: Negative.  Negative for cough, hemoptysis, sputum production, shortness of breath and wheezing.   Cardiovascular: Negative.  Negative for chest pain and palpitations.  Gastrointestinal:  Positive for diarrhea. Negative for abdominal pain, nausea and vomiting.  Genitourinary: Negative.  Negative for dysuria and hematuria.  Skin: Negative.   Negative for rash.  Neurological:  Negative for dizziness and headaches.  All other systems reviewed and are negative.  Today's Vitals   01/28/22 0955  BP: 130/72  Pulse: (!) 111  Temp: 98 F (36.7 C)  TempSrc: Oral  SpO2: 95%  Weight: 98 lb 6 oz (44.6 kg)  Height: _0  (1.651 m)   Body mass index is 16.37 kg/m. Repeat heart rate: 84 and regular  Physical Exam Vitals reviewed.  Constitutional:      Appearance: Normal appearance.  HENT:     Head: Normocephalic.  Eyes:     Extraocular Movements: Extraocular movements intact.     Pupils: Pupils are equal, round, and reactive to light.  Cardiovascular:     Rate and Rhythm: Normal rate and regular rhythm.     Pulses: Normal pulses.     Heart sounds: Normal heart sounds.  Pulmonary:     Effort: Pulmonary effort is normal.     Breath sounds: Rales (Diffuse dry crackles) present.  Abdominal:     Palpations: Abdomen is soft.     Tenderness: There is no abdominal tenderness.  Musculoskeletal:     Cervical back: No tenderness.  Lymphadenopathy:     Cervical: No cervical adenopathy.  Skin:    General: Skin is warm and dry.     Capillary Refill: Capillary refill takes less than 2 seconds.  Neurological:     General: No focal deficit present.     Mental Status: He is alert and oriented to person, place, and time.  Psychiatric:        Mood and Affect: Mood normal.        Behavior: Behavior normal.    DG Chest 2 View  Result Date: 01/28/2022 CLINICAL DATA:  Pneumonia follow-up EXAM: CHEST - 2 VIEW COMPARISON:  01/19/2022 FINDINGS: Bilateral interstitial thickening consistent with chronic interstitial lung disease and bibasilar fibrosis. Mild fibrosis to lesser extent in bilateral upper lobes. No focal consolidation. No pleural effusion or pneumothorax. Heart and mediastinal contours are unremarkable. No acute osseous abnormality. IMPRESSION: 1. Chronic interstitial lung disease and bibasilar fibrosis. Superimposed pneumonia  cannot be completely excluded particularly at the lung bases. Electronically Signed   By: Kathreen Devoid M.D.   On: 01/28/2022 10:45     ASSESSMENT & PLAN: A total of 51 minutes was spent with the patient and counseling/coordination of care regarding preparing for this visit, review of most recent office visit notes, review of most recent discharge summary from hospital visit, review of multiple chronic medical problems and their management, review of all medications, review of most recent blood work results, review of today's chest x-ray, prognosis, documentation and need for follow-up.  Problem List Items Addressed This Visit       Cardiovascular and Mediastinum  Pneumomediastinum ([place])    Not visible on today's x-ray.      Chronic diastolic CHF (congestive heart failure) (HCC)    No signs or symptoms of acute CHF Euvolemic Wt Readings from Last 3 Encounters:  01/28/22 98 lb 6 oz (44.6 kg)  01/21/22 103 lb 14.4 oz (47.1 kg)  01/16/22 103 lb 6.4 oz (46.9 kg)           Respiratory   Interstitial lung disease ([place])    Stable.  Advised to take Symbicort daily as directed. Albuterol*inhaler. We will follow-up with pulmonary doctor this week as scheduled.      Relevant Orders   DG Chest 2 View (Completed)   Rheumatoid lung disease with rheumatoid arthritis ([place])    Stable.  Pulmonary follow-up as scheduled Continue inhalers as prescribed.      Community acquired pneumonia    Much improved.  Finished course of antibiotics.      Relevant Orders   DG Chest 2 View (Completed)   CBC with Differential/Platelet   Comprehensive metabolic panel     Other   Dyslipidemia   Other Visit Diagnoses     Hospital discharge follow-up    -  Primary      Patient Instructions  Pulmonary Fibrosis  Pulmonary fibrosis is a type of lung disease that causes scarring. Over time, the scar tissue builds up in the air sacs of your lungs (alveoli). This makes it hard for you to breathe  because less oxygen gets into your bloodstream. Scarring from pulmonary fibrosis is permanent and may lead to other serious health problems. What are the causes? There are many different causes of pulmonary fibrosis. In some cases, the cause is not known. This is called idiopathic pulmonary fibrosis. Other causes include: Exposure to chemicals and substances found in agricultural, farm, Architect, or factory work. These include mold, asbestos, silica, metal dusts, and toxic fumes. Sarcoidosis. In this disease, areas of inflammatory cells (granulomas) form and most often affect the lungs. Autoimmune diseases. These include diseases such as rheumatoid arthritis, systemic sclerosis, or connective tissue disease. Taking certain medicines. These include drugs used in radiation therapy or used to treat seizures, heart problems, and some infections. What increases the risk? You are more likely to develop this condition if: You have a family history of the disease. You are an older person. The condition is more common in older adults. You have a history of smoking. You have a job that exposes you to certain chemicals. You have gastroesophageal reflux disease (GERD). What are the signs or symptoms? Symptoms of this condition include: Difficulty breathing that gets worse with activity. Shortness of breath (dyspnea). Dry, hacking cough. Rapid, shallow breathing during exercise or while at rest. Other symptoms may include: Loss of appetite or weight loss Tiredness (fatigue) or weakness. Bluish skin and lips. Rounded and enlarged fingertips (clubbing). How is this diagnosed? This condition may be diagnosed based on: Your symptoms and medical history. A physical exam. You may also have tests, including: A test that involves looking inside your lungs with an instrument (bronchoscopy). Imaging studies of your lungs and heart. Tests to measure how well you are breathing (pulmonary function  tests). Blood tests. Tests to see how well your lungs work while you are walking (pulmonary stress test). A procedure to remove a lung tissue sample to look at it under a microscope (biopsy). How is this treated? There is no cure for pulmonary fibrosis. Treatment focuses on managing symptoms and preventing scarring from getting  worse. This may include: Medicines, such as: Steroids to prevent permanent lung changes. Medicines to suppress your body's defense system (immune system). Medicines to help with lung function by reducing inflammation or scarring. Ongoing monitoring with X-rays and lab work. Oxygen therapy. Pulmonary rehabilitation. Surgery. In some cases, a lung transplant is possible. Follow these instructions at home:  Medicines Take over-the-counter and prescription medicines only as told by your health care provider. Keep your vaccinations up to date as recommended by your health care provider. Activity Get regular exercise, but do not pick activities that are too strenuous for you. Ask your health care provider what activities are safe for you. If you have physical limitations, you may get exercise by walking, using a stationary bike, or doing chair exercises. Ask your health care provider about using oxygen while exercising. Do breathing exercises as told by your health care provider. Plan rest periods when you get tired. General instructions Do not use any products that contain nicotine or tobacco. These products include cigarettes, chewing tobacco, and vaping devices, such as e-cigarettes. If you need help quitting, ask your health care provider. If you are exposed to chemicals and substances at work, make sure that you wear a mask or respirator at all times. Learn to manage stress. If you need help to do this, ask your health care provider. Join a pulmonary rehabilitation program or a support group for people with pulmonary fibrosis. Eat small meals often so you do not  get too full. Overeating can make breathing trouble worse. Maintain a healthy weight. Lose weight if you need to. Keep all follow-up visits. This is important. Where to find more information American Lung Association: www.lung.org National Heart, Lung, and Blood Institute: https://wilson-eaton.com/ Pulmonary Fibrosis Foundation: pulmonaryfibrosis.org Contact a health care provider if: You have symptoms that do not get better with medicines. You are not able to be as active as usual. You have trouble taking a deep breath. You have a fever or chills. You have blue lips or skin. You have a lot of headaches. You cough up mucus that is dark in color. You have feelings of depression or sadness. You are unable to sleep because it is hard to breathe. Get help right away if: Your symptoms suddenly worsen. You have chest pain. You cough up blood. You get very confused or sleepy. These symptoms may be an emergency. Get help right away. Call 911. Do not wait to see if the symptoms will go away. Do not drive yourself to the hospital. Summary Pulmonary fibrosis is a type of lung disease that causes scar tissue to build up in the air sacs of your lungs (alveoli) over time. This makes it hard for you to breathe because less oxygen gets into your bloodstream. Scarring from pulmonary fibrosis is permanent and may lead to other serious health problems. You are more likely to develop this condition if you have a family history of the condition or a job that exposes you to certain chemicals. There is no cure for pulmonary fibrosis. Treatment focuses on managing symptoms and preventing scarring from getting worse. This information is not intended to replace advice given to you by your health care provider. Make sure you discuss any questions you have with your health care provider. Document Revised: 02/13/2021 Document Reviewed: 02/13/2021 Elsevier Patient Education  [place],  MD [facility]

## 2022-01-28 NOTE — Assessment & Plan Note (Signed)
Stable.  Advised to take Symbicort daily as directed. Albuterol*inhaler. We will follow-up with pulmonary doctor this week as scheduled.

## 2022-01-29 DIAGNOSIS — J982 Interstitial emphysema: Secondary | ICD-10-CM | POA: Diagnosis not present

## 2022-01-29 DIAGNOSIS — J1282 Pneumonia due to coronavirus disease 2019: Secondary | ICD-10-CM | POA: Diagnosis not present

## 2022-01-29 DIAGNOSIS — J440 Chronic obstructive pulmonary disease with acute lower respiratory infection: Secondary | ICD-10-CM | POA: Diagnosis not present

## 2022-01-29 DIAGNOSIS — J9621 Acute and chronic respiratory failure with hypoxia: Secondary | ICD-10-CM | POA: Diagnosis not present

## 2022-01-29 DIAGNOSIS — J8409 Other alveolar and parieto-alveolar conditions: Secondary | ICD-10-CM | POA: Diagnosis not present

## 2022-01-29 DIAGNOSIS — U071 COVID-19: Secondary | ICD-10-CM | POA: Diagnosis not present

## 2022-01-31 ENCOUNTER — Encounter: Payer: Self-pay | Admitting: Nurse Practitioner

## 2022-01-31 ENCOUNTER — Ambulatory Visit (INDEPENDENT_AMBULATORY_CARE_PROVIDER_SITE_OTHER): Payer: Medicare Other | Admitting: Nurse Practitioner

## 2022-01-31 DIAGNOSIS — M0510 Rheumatoid lung disease with rheumatoid arthritis of unspecified site: Secondary | ICD-10-CM | POA: Diagnosis not present

## 2022-01-31 DIAGNOSIS — J189 Pneumonia, unspecified organism: Secondary | ICD-10-CM | POA: Diagnosis not present

## 2022-01-31 DIAGNOSIS — R634 Abnormal weight loss: Secondary | ICD-10-CM | POA: Diagnosis not present

## 2022-01-31 DIAGNOSIS — J982 Interstitial emphysema: Secondary | ICD-10-CM

## 2022-01-31 MED ORDER — METHOTREXATE SODIUM 10 MG PO TABS: 10.0000 mg | ORAL_TABLET | ORAL | 2 refills | Status: DC

## 2022-01-31 NOTE — Assessment & Plan Note (Addendum)
Mild pneumomediastinum noted during his hospitalization.  They had backed off on PEP devices given this.  Reviewed today with Dr. Verlee Monte.  Improvement on imaging.  Advised that he could restart flutter valve 2-3 times a day to help with chest congestion.  Instructed to stop if he develop chest pain or discomfort.

## 2022-01-31 NOTE — Assessment & Plan Note (Signed)
Continues to struggle with ongoing weight loss.  He is down about 7 pounds since discharge, which we discussed today.  He is drinking ensures 3 times a day.  Appetite is not great and they are curious about potential appetite stimulants.  Advised that I would discuss this with Dr. Shearon Stalls.  Encouraged small, frequent high-protein meals.  Instructed them to monitor his weight closely.  He was provided with strict ED precautions should any of his symptoms worsen.  Close follow-up.

## 2022-01-31 NOTE — Assessment & Plan Note (Signed)
Recent COVID-pneumonia with possible concurrent bacterial CAP.  He was treated with IV steroids and antibiotics.  Discharged home on high-dose prednisone taper, prophylactic Bactrim and 5-day course of azithromycin.  He has completed the azithromycin.  He is currently on 50 mg of prednisone.  Respiratory status is slowly improving.  He is still not back to his baseline and has been having trouble with ongoing weight loss.  He was also noted to be tachycardic upon arrival to exam room and 92% on 3 L POC, which is not uncommon from him upon review of his chart; however, we placed him on 3 L continuous and his heart rate improved to 109 from 120s and oxygen was 97% with exertion.  CXR showed improvement in his pneumomediastinum as well as his infiltrates.  Reviewed imaging and case with Dr. Verlee Monte, as Dr. Shearon Stalls is out of office and Dr. Verlee Monte last saw him in the hospital.  Advised mucociliary clearance therapies -okay to use flutter valve.  Recommended close follow-up.  Provided patient with strict ED precautions.  Patient Instructions  Continue Symbicort 2 puffs Twice daily. Brush tongue and rinse mouth afterwards Continue Albuterol inhaler 2 puffs or 3 mL neb every 6 hours as needed for shortness of breath or wheezing. Notify if symptoms persist despite rescue inhaler/neb use.  Continue Zyrtec 10 mg daily Continue Xyzal 1 tab daily for allergies Continue methotrexate 1 tab once a week, as previously prescribed Continue prednisone taper as directed. Continue bactrim 1 tab on Monday, Wednesday and Friday until you are down to 20 mg of prednisone then you can stop. Continue supplemental oxygen 3 lpm for goal>88-90%. Use your continuous portable tanks until you are seen back instead of your POC  Mucinex 600 mg Twice daily for chest congestion/cough Flutter valve 2-3 times a day. Do this after your inhaler   Small, frequent high protein meals. Continue Ensure supplements Continue working with PT/OT  Follow  up in 2 weeks with Dr. Shearon Stalls. If symptoms do not improve or worsen, please contact office for sooner follow up or seek emergency care.

## 2022-01-31 NOTE — Progress Notes (Signed)
_0  ID: Earl Thomas, male    DOB: 28-Mar-1951, 71 y.o.   MRN: [id number]  Chief Complaint  Patient presents with   Hospitalization Follow-up    Pt was released from hospital x4 days ago. Pt states he is still struggling to breath. SOB withexertion.    Referring provider: Horald Pollen, *  HPI: 71 year old male, former smoker followed for RA-ILD and chronic respiratory failure on home oxygen.  Intolerant of Ofev due to diarrhea.  Had progression on Esbriet as well as weight loss and diarrhea.  He is a patient of Dr. Mauricio Po and last seen in office 01/16/2022.  Previous discussion with Dr. Trudie Reed who added rituximab in addition to methotrexate.  He has also been followed by the ILD clinic at [facility] and is due to see [facility] transplant.  He was recently admitted for acute on chronic respiratory failure related to bacterial and COVID-pneumonia as well as pneumomediastinum from 01/18/2022 to 01/21/2022.  He had recently traveled to [place] with family and while there he developed loose stools, nausea, worsening dyspnea and intermittent fever.  He was treated with combination of IV steroids, remdesivir, IV antibiotics.  D-dimer was noted to be positive upon admission, likely due to COVID-19 inflammation.  CTA and lower extremity Dopplers were negative. They were able to wean him to his baseline 3 L/min supplemental O2 with saturations between 99 to 100%.  He had significant improvement and was discharged on prednisone taper, 3 times a week Bactrim and 5 more days of oral azithromycin.  TEST/EVENTS:  10/29/2021 PFT: FVC 38, FEV1 50, ratio 99, DLCOcor 39 01/18/2022 CTA chest: No evidence of PE.  There is moderate pneumomediastinum present.  Gas is seen tracking into the aortopulmonary groove as well as the right neck base.  Enhancing soft tissue within the subcarinal and bilateral hilar regions may represent reactive adenopathy.  There is a small hiatal hernia.  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  There has developed mild focal consolidation within the posterior basal right lower lobe, may represent superimposed acute infection in the appropriate clinical setting.  More sparse infiltrate is seen diffusely throughout the right lung base.  01/19/2022 CXR 1 view: Pulmonary hypoinflation.  Extensive coarse pulmonary infiltrates in keeping with underlying interstitial lung disease.  Superimposed diffuse interstitial pulmonary infiltrates, mildly progressed at the lung bases since remote prior exam from May 2023 but stable since immediate prior CT 7/14 01/28/2022 CXR 2 view: Bilateral interstitial thickening consistent with chronic interstitial lung disease and bibasilar fibrosis.  Mild fibrosis to lesser extent in bilateral upper lobes.  No focal consolidation, pneumothorax or pleural effusion.  01/31/2022: Today - follow up Patient presents today with his wife for hospital follow-up after being treated for COVID-pneumonia and possible superimposed CAP.  He was discharged on prednisone taper which started at 60 mg.  He reports that he is currently on the 50 mg dose and will go down to 40 next Tuesday.  He is also taking Bactrim 3 times a week for prophylaxis.  Today, he reports feeling better since his hospitalization.  He still has some generalized weakness but feels like this is slowly improving he is working with physical therapy weekly which seems to be helping but he is still not able to do much.  Fatigue is relatively unchanged.  His breathing has been slightly better; still not quite back to his baseline.  Cough is congested but nonproductive for the most part.  Occasionally he will produce some clear sputum.  He denies any fevers, night sweats, hemoptysis, orthopnea, lower extremity swelling.  He has had ongoing weight loss, which has  been a struggle for him for quite some time now.  He is down about 7 pounds since discharge.  He continues on Symbicort twice a day.  Not using albuterol much.  He is taking Zyrtec and Xyzal for allergies.  Currently on methotrexate and Rituxan.  Oxygen has been stable at home without any increased O2 requirement.  No Known Allergies  Immunization History  Administered Date(s) Administered   Fluad Quad(high Dose 65+) 04/13/2020, 04/18/2021   Influenza, High Dose Seasonal PF 04/24/2019   Influenza,inj,Quad PF,6+ Mos 05/30/2015, 04/23/2016, 05/14/2017, 03/25/2018   Influenza-Unspecified 04/07/2014   PFIZER(Purple Top)SARS-COV-2 Vaccination 07/30/2019, 08/20/2019, 04/27/2020   PNEUMOCOCCAL CONJUGATE-20 12/25/2020   Pfizer Covid-19 Vaccine Bivalent Booster 39yr & up 04/24/2021   Pneumococcal Conjugate-13 05/18/2018   Pneumococcal Polysaccharide-23 06/09/2019   Td 03/10/2021   Tdap 02/13/2009, 05/14/2017   Zoster Recombinat (Shingrix) 05/27/2018, 01/19/2019   Zoster, Live 04/12/2014    Past Medical History:  Diagnosis Date   Abnormal weight loss 12/04/2021   Acute interstitial pneumonitis ([place] 12/04/2021   Aortic atherosclerosis ([facility] 12/04/2021   Arthralgia of temporomandibular joint 12/04/2021   Arthritis    BMI 26.0-26.9,adult 10/22/2016   Chronic fatigue syndrome 12/04/2021   Dyslipidemia 03/10/2012   Family history of prostate cancer 12/04/2021   Glucose intolerance (impaired glucose tolerance)    Hyperlipidemia    Interstitial lung disease ([place] 07/16/2019   Long-term current use of high risk medication other than anticoagulant 07/16/2019   Other long term (current) drug therapy 12/04/2021   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   Renal insufficiency 04/29/2016   Rheumatoid lung disease with rheumatoid arthritis ([place] 07/16/2019   Skin lesion of chest wall 12/04/2021    Tobacco History: Social History    Tobacco Use  Smoking Status Former   Types: Pipe   Quit date: 10/06/1988   Years since quitting: 33.3   Passive exposure: Never  Smokeless Tobacco Never  Tobacco Comments   parent smoked in home as a child.  Smoked a pip in college, not daily.   Counseling given: Not Answered Tobacco comments: parent smoked in home as a child.  Smoked a pip in college, not daily.   Outpatient Medications Prior to Visit  Medication Sig Dispense Refill   acetaminophen (TYLENOL) 500 MG tablet Take 1,000 mg by mouth every 6 (six) hours as needed for headache (pain).     albuterol (VENTOLIN HFA) 108 (90 Base) MCG/ACT inhaler Inhale 2 puffs into the lungs every 6 (six) hours as needed for wheezing or shortness of breath. 18 g 5   aspirin EC 81 MG tablet Take 81 mg by mouth every morning.     azelastine (ASTELIN) 0.1 % nasal spray Place 2 sprays into both nostrils 2 (two) times daily. Use in each nostril as directed (Patient taking differently: Place 2 sprays into both nostrils 2 (two) times daily as needed for rhinitis or allergies (congestion). Use in each nostril as directed) 30 mL 12   Brimonidine Tartrate (LUMIFY) 0.025 % SOLN Place 1 drop into both eyes every Sunday.     cetirizine (ZYRTEC) 10 MG tablet Take 10 mg by mouth daily.     diclofenac sodium (VOLTAREN) 1 % GEL Apply 2 g topically 4 (  four) times daily. (Patient taking differently: Apply 2 g topically 4 (four) times daily as needed (pain).) 100 g 1   famotidine (PEPCID) 20 MG tablet Take 1 tablet (20 mg total) by mouth 2 (two) times daily. 90 tablet 3   folic acid ([name]) 1 MG tablet Take 1 mg by mouth every morning.     guaifenesin (ROBITUSSIN) 100 MG/5ML syrup Take 200 mg by mouth 3 (three) times daily as needed for cough or congestion.     levocetirizine (XYZAL) 5 MG tablet Take 1 tablet (5 mg total) by mouth every evening. 30 tablet 5   loperamide (IMODIUM) 2 MG capsule Take 2 mg by mouth daily as needed for diarrhea or loose stools.      meloxicam (MOBIC) 7.5 MG tablet Take 1 tablet (7.5 mg total) by mouth daily as needed for pain. 30 tablet 2   Naphazoline HCl (CLEAR EYES OP) Place 1 drop into both eyes daily.     nitroGLYCERIN (NITROSTAT) 0.4 MG SL tablet Place 1 tablet (0.4 mg total) under the tongue every 5 (five) minutes as needed. (Patient taking differently: Place 0.4 mg under the tongue every 5 (five) minutes as needed for chest pain.) 25 tablet 6   predniSONE (DELTASONE) 10 MG tablet Take 6 tabs (39m) by mouth daily for 1 week, followed by 5 tabs daily for 1 week, then 4 tabs daily for 1 week, then 3 tabs daily, Pulmonary physician to titrate. 132 tablet 0   riTUXimab (RITUXAN IV) Inject into the vein.     sulfamethoxazole-trimethoprim (BACTRIM DS) 800-160 MG tablet Take 1 pill on Monday, Wednesday, and Friday of each week 15 tablet 0   SYMBICORT 80-4.5 MCG/ACT inhaler Inhale 2 puffs by mouth twice daily (Patient taking differently: Inhale 2 puffs into the lungs 2 (two) times daily as needed (shortness of breath/wheezing).) 11 g 0   vitamin C (ASCORBIC ACID) 500 MG tablet Take 500 mg by mouth daily after supper.     methotrexate (RHEUMATREX) 10 MG tablet Take 1 tablet (10 mg total) by mouth once a week. Caution: Chemotherapy. Protect from light. 4 tablet 2   azithromycin (ZITHROMAX) 500 MG tablet Take 1 tablet (500 mg total) by mouth daily. 5 tablet 0   No facility-administered medications prior to visit.     Review of Systems:   Constitutional: No night sweats, fevers, chills. +fatigue, lassitude, weight loss. HEENT: No headaches, difficulty swallowing, tooth/dental problems, or sore throat. No sneezing, itching, ear ache, nasal congestion, or post nasal drip CV:  No chest pain, orthopnea, PND, swelling in lower extremities, anasarca, dizziness, palpitations, syncope Resp: +shortness of breath with exertion (slowly improving); congestion cough, primarily non-productive but occasionally clear sputum. No excess mucus or  change in color of mucus. No hemoptysis. No wheezing.  No chest wall deformity GI:  +decreased appetite (baseline). No heartburn, indigestion, abdominal pain, nausea, vomiting, diarrhea, change in bowel habits, loss of appetite, bloody stools.  Skin: No rash, lesions, ulcerations MSK:  No joint pain or swelling.  No decreased range of motion.  No back pain. Neuro: No dizziness or lightheadedness.  Psych: No depression or anxiety. Mood stable.     Physical Exam:  BP 104/62 (BP Location: Right Arm, Patient Position: Sitting, Cuff Size: Normal)   Pulse (!) 121   Temp 98.3 F (36.8 C) (Oral)   Ht _0  (1.651 m)   Wt 96 lb 12.8 oz (43.9 kg)   SpO2 92%   BMI 16.11 kg/m   GEN: Pleasant,  interactive, chronically-ill appearing; in no acute distress. HEENT:  Normocephalic and atraumatic. PERRLA. Sclera white. Nasal turbinates pink, moist and patent bilaterally. No rhinorrhea present. Oropharynx pink and moist, without exudate or edema. No lesions, ulcerations, or postnasal drip.  NECK:  Supple w/ fair ROM. No JVD present. Normal carotid impulses w/o bruits. Thyroid symmetrical with no goiter or nodules palpated. No lymphadenopathy.   CV: tachycardic, regular rhythm, no m/r/g, no peripheral edema. Pulses intact, +2 bilaterally. No cyanosis, pallor or clubbing. PULMONARY:  Unlabored, regular breathing. Bibasilar crackles otherwise clear bilaterally A&P w/o wheezes/rales/rhonchi. No accessory muscle use. No dullness to percussion. GI: BS present and normoactive. Soft, non-tender to palpation. No organomegaly or masses detected. No CVA tenderness. MSK: No erythema, warmth or tenderness. Cap refil <2 sec all extrem. No deformities or joint swelling noted. Muscle wasting Neuro: A/Ox3. No focal deficits noted.   Skin: Warm, no lesions or rashe Psych: Normal affect and behavior. Judgement and thought content appropriate.     Lab Results:  CBC    Component Value Date/Time   WBC 12.4 (H)  01/28/2022 1025   RBC 4.94 01/28/2022 1025   HGB 13.2 01/28/2022 1025   HGB 13.1 12/26/2021 1419   HGB 14.8 11/12/2017 1001   HCT 41.8 01/28/2022 1025   HCT 45.3 11/12/2017 1001   PLT 266.0 01/28/2022 1025   PLT 240 12/26/2021 1419   PLT 197 11/12/2017 1001   MCV 84.5 01/28/2022 1025   MCV 86 11/12/2017 1001   MCH 27.5 01/21/2022 0512   MCHC 31.7 01/28/2022 1025   RDW 14.6 01/28/2022 1025   RDW 14.5 11/12/2017 1001   LYMPHSABS 0.8 01/28/2022 1025   LYMPHSABS 1.7 11/12/2017 1001   MONOABS 0.6 01/28/2022 1025   EOSABS 0.2 01/28/2022 1025   EOSABS 0.2 11/12/2017 1001   BASOSABS 0.0 01/28/2022 1025   BASOSABS 0.0 11/12/2017 1001    BMET    Component Value Date/Time   NA 137 01/28/2022 1025   NA 139 07/16/2019 1005   K 4.2 01/28/2022 1025   CL 97 01/28/2022 1025   CO2 34 (H) 01/28/2022 1025   GLUCOSE 120 (H) 01/28/2022 1025   BUN 25 (H) 01/28/2022 1025   BUN 14 07/16/2019 1005   CREATININE 1.02 01/28/2022 1025   CREATININE 0.98 12/26/2021 1419   CREATININE 1.21 04/23/2016 1619   CALCIUM 9.8 01/28/2022 1025   GFRNONAA >60 01/21/2022 0512   GFRNONAA >60 12/26/2021 1419   GFRNONAA 60 01/11/2016 1431   GFRAA 97 07/16/2019 1005   GFRAA 70 01/11/2016 1431    BNP    Component Value Date/Time   BNP 151.3 (H) 01/21/2022 0515     Imaging:  DG Chest 2 View  Result Date: 01/28/2022 CLINICAL DATA:  Pneumonia follow-up EXAM: CHEST - 2 VIEW COMPARISON:  01/19/2022 FINDINGS: Bilateral interstitial thickening consistent with chronic interstitial lung disease and bibasilar fibrosis. Mild fibrosis to lesser extent in bilateral upper lobes. No focal consolidation. No pleural effusion or pneumothorax. Heart and mediastinal contours are unremarkable. No acute osseous abnormality. IMPRESSION: 1. Chronic interstitial lung disease and bibasilar fibrosis. Superimposed pneumonia cannot be completely excluded particularly at the lung bases. Electronically Signed   By: Kathreen Devoid M.D.   On:  01/28/2022 10:45   VAS [place] LOWER EXTREMITY VENOUS (DVT)  Result Date: 01/20/2022  Lower Venous DVT Study Patient Name:  ANGELITO HOPPING  Date of Exam:   01/19/2022 Medical Rec #: [id number]  Accession #:    [id number] Date of Birth: October 08, 1950             Patient Gender: M Patient Age:   83 years Exam Location:  [facility] Procedure:      VAS [place] LOWER EXTREMITY VENOUS (DVT) Referring Phys: Deno Etienne [facility] --------------------------------------------------------------------------------  Indications: Covid, elevated D-Dimer.  Comparison Study: No prior study on file Performing Technologist: Sharion Dove RVS  Examination Guidelines: A complete evaluation includes B-mode imaging, spectral Doppler, color Doppler, and power Doppler as needed of all accessible portions of each vessel. Bilateral testing is considered an integral part of a complete examination. Limited examinations for reoccurring indications may be performed as noted. The reflux portion of the exam is performed with the patient in reverse Trendelenburg.  +---------+---------------+---------+-----------+----------+--------------+ RIGHT    CompressibilityPhasicitySpontaneityPropertiesThrombus Aging +---------+---------------+---------+-----------+----------+--------------+ CFV      Full           Yes      Yes                                 +---------+---------------+---------+-----------+----------+--------------+ SFJ      Full                                                        +---------+---------------+---------+-----------+----------+--------------+ FV Prox  Full                                                        +---------+---------------+---------+-----------+----------+--------------+ FV Mid   Full                                                        +---------+---------------+---------+-----------+----------+--------------+ FV DistalFull                                                         +---------+---------------+---------+-----------+----------+--------------+ PFV      Full                                                        +---------+---------------+---------+-----------+----------+--------------+ POP      Full           Yes      Yes                                 +---------+---------------+---------+-----------+----------+--------------+ PTV      Full                                                        +---------+---------------+---------+-----------+----------+--------------+  PERO     Full                                                        +---------+---------------+---------+-----------+----------+--------------+   +---------+---------------+---------+-----------+----------+--------------+ LEFT     CompressibilityPhasicitySpontaneityPropertiesThrombus Aging +---------+---------------+---------+-----------+----------+--------------+ CFV      Full           Yes      Yes                                 +---------+---------------+---------+-----------+----------+--------------+ SFJ      Full                                                        +---------+---------------+---------+-----------+----------+--------------+ FV Prox  Full                                                        +---------+---------------+---------+-----------+----------+--------------+ FV Mid   Full                                                        +---------+---------------+---------+-----------+----------+--------------+ FV DistalFull                                                        +---------+---------------+---------+-----------+----------+--------------+ PFV      Full                                                        +---------+---------------+---------+-----------+----------+--------------+ POP      Full           Yes      Yes                                  +---------+---------------+---------+-----------+----------+--------------+ PTV      Full                                                        +---------+---------------+---------+-----------+----------+--------------+ PERO     Full                                                        +---------+---------------+---------+-----------+----------+--------------+  Summary: BILATERAL: - No evidence of deep vein thrombosis seen in the lower extremities, bilaterally. -No evidence of popliteal cyst, bilaterally.   *See table(s) above for measurements and observations. Electronically signed by Harold Barban MD on 01/20/2022 at 7:17:16 PM.    Final    DG Chest Port 1 View  Result Date: 01/19/2022 CLINICAL DATA:  Dyspnea.  [id number] EXAM: PORTABLE CHEST 1 VIEW COMPARISON:  01/18/2022 FINDINGS: Lung volumes are small. Superimposed diffuse coarse interstitial pulmonary infiltrates, more prevalent at the lung bases bilaterally, are again identified in keeping with changes of underlying interstitial lung disease, better assessed on CT examination of 01/18/2022. These appears stable since immediate prior examination, though appear mildly progressed at the lung bases bilaterally since remote prior examination of 11/10/2021. This may reflect superimposed infectious or inflammatory infiltrate, again better appreciated on prior CT examination. No pneumothorax or pleural effusion. Cardiac size within normal limits. No acute bone abnormality. IMPRESSION: 1. Pulmonary hypoinflation. 2. Extensive coarse pulmonary infiltrates in keeping with underlying interstitial lung disease. 3. Superimposed diffuse interstitial pulmonary infiltrates, mildly progressed at the lung bases since remote prior examination of 11/10/2021 but stable since immediate prior examination of 01/18/2022, possibly reflecting superimposed infectious or inflammatory infiltrate. Electronically Signed   By: Fidela Salisbury M.D.   On: 01/19/2022 03:50    CT Angio Chest PE W and/or Wo Contrast  Result Date: 01/18/2022 CLINICAL DATA:  Pulmonary embolism (PE) suspected, high prob EXAM: CT ANGIOGRAPHY CHEST WITH CONTRAST TECHNIQUE: Multidetector CT imaging of the chest was performed using the standard protocol during bolus administration of intravenous contrast. Multiplanar CT image reconstructions and MIPs were obtained to evaluate the vascular anatomy. RADIATION DOSE REDUCTION: This exam was performed according to the departmental dose-optimization program which includes automated exposure control, adjustment of the mA and/or kV according to patient size and/or use of iterative reconstruction technique. CONTRAST:  26m OMNIPAQUE IOHEXOL 350 MG/ML SOLN COMPARISON:  11/10/2021 FINDINGS: Cardiovascular: There is adequate opacification of the pulmonary arterial tree. No intraluminal filling defect identified to suggest acute pulmonary embolism. No significant coronary artery calcification. Global cardiac size within normal limits. No pericardial effusion. Central pulmonary arteries are of normal caliber. The thoracic aorta is unremarkable. Mediastinum/Nodes: There has developed moderate pneumomediastinum within the anterior mediastinum best appreciated substernally. Gas is seen tracking into the aortopulmonary groove as well as the right neck base. Visualized thyroid is unremarkable. Enhancing soft tissue within the subcarinal and bilateral hilar regions may represent reactive adenopathy. No pathologically enlarged thoracic adenopathy. Esophagus is unremarkable. Small hiatal hernia. Lungs/Pleura: There is extensive subpleural architectural distortion with honeycombing demonstrating a basilar predominance with associated traction bronchiectasis in keeping with a UIP pattern of interstitial lung disease. There has developed mild focal consolidation within the posterior basal right lower lobe which may represent superimposed acute infection in the appropriate clinical  setting. More sparse infiltrate is seen diffusely throughout the right lung base, best appreciated on axial image # 58/7. No central obstructing lesion. No pneumothorax or pleural effusion. Upper Abdomen: No acute abnormality. Musculoskeletal: No acute bone abnormality. No lytic or blastic bone lesion. Review of the MIP images confirms the above findings. IMPRESSION: 1. No pulmonary embolism. 2. Interval development of moderate pneumomediastinum. No pneumothorax. 3. Interval development of focal consolidation within the posterior basal right lower lobe which may represent superimposed acute infection in the appropriate clinical setting. More sparse infiltrate is seen diffusely throughout the right lung base. 4. UIP pattern of interstitial lung disease, grossly stable since prior examination. Findings are consistent  with UIP per consensus guidelines: Diagnosis of Idiopathic Pulmonary Fibrosis: An Official ATS/ERS/JRS/ALAT Clinical Practice Guideline. [place], Iss 5, [phone], Mar 08 2017. Electronically Signed   By: Fidela Salisbury M.D.   On: 01/18/2022 20:38   DG Chest Portable 1 View  Result Date: 01/18/2022 CLINICAL DATA:  Cough EXAM: PORTABLE CHEST 1 VIEW COMPARISON:  11/10/2021 FINDINGS: Severe chronic interstitial disease throughout the lungs compatible with severe chronic fibrosis as seen on prior plain films and CT. No definite acute areas of consolidation or effusions. Heart is normal size. No acute bony abnormality. IMPRESSION: Severe chronic lung disease/fibrosis.  No definite acute process. Electronically Signed   By: Rolm Baptise M.D.   On: 01/18/2022 18:57   MYOCARDIAL PERFUSION IMAGING  Result Date: 01/14/2022   LV perfusion is normal. There is no evidence of ischemia. There is no evidence of infarction.   Left ventricular function is normal. Nuclear stress EF: 65 %. The left ventricular ejection fraction is moderately decreased (30-44%). End diastolic cavity size is  normal.   The study is normal. The study is low risk.    regadenoson (LEXISCAN) injection SOLN 0.4 mg     Date Action Dose Route User   Discharged on 01/21/2022   Admitted on 01/18/2022   01/14/2022 1210 Given 0.4 mg Intravenous Hornowski Kubak, Elzbieta      technetium tetrofosmin (TC-MYOVIEW) injection 62.2 millicurie     Date Action Dose Route User   Discharged on 01/21/2022   Admitted on 01/18/2022   01/14/2022 1052 Contrast Given 63.3 millicurie Intravenous Hornowski Kubak, Elzbieta      technetium tetrofosmin (TC-MYOVIEW) injection 35.4 millicurie     Date Action Dose Route User   Discharged on 01/21/2022   Admitted on 01/18/2022   01/14/2022 1211 Contrast Given 56.2 millicurie Intravenous Hornowski Earl Many          Latest Ref Rng & Units 10/29/2021    3:00 PM 04/18/2021    3:05 PM 11/13/2020    3:07 PM 08/07/2020   10:04 AM 07/26/2019   10:56 AM  PFT Results  FVC-Pre L 1.22  1.71  1.71  1.37  1.52   FVC-Predicted Pre % 38  54  54  43  47   FVC-Post L     1.82   FVC-Predicted Post %     57   Pre FEV1/FVC % % 99  94  92  100  100   Post FEV1/FCV % %     88   FEV1-Pre L 1.21  1.61  1.57  1.37  1.52   FEV1-Predicted Pre % 50  67  65  57  63   FEV1-Post L     1.61   DLCO uncorrected ml/min/mmHg 8.80  10.08  11.16  9.68  11.79   DLCO UNC% % 39  45  49  42  52   DLCO corrected ml/min/mmHg 8.80   11.16  9.68    DLCO COR %Predicted % 39   49  42    DLVA Predicted % 55  71  104  73  82   TLC L     3.42   TLC % Predicted %     57   RV % Predicted %     84     No results found for: "NITRICOXIDE"      Assessment & Plan:   Community acquired pneumonia Recent COVID-pneumonia with possible concurrent bacterial CAP.  He was treated  with IV steroids and antibiotics.  Discharged home on high-dose prednisone taper, prophylactic Bactrim and 5-day course of azithromycin.  He has completed the azithromycin.  He is currently on 50 mg of prednisone.  Respiratory status is  slowly improving.  He is still not back to his baseline and has been having trouble with ongoing weight loss.  He was also noted to be tachycardic upon arrival to exam room and 92% on 3 L POC, which is not uncommon from him upon review of his chart; however, we placed him on 3 L continuous and his heart rate improved to 109 from 120s and oxygen was 97% with exertion.  CXR showed improvement in his pneumomediastinum as well as his infiltrates.  Reviewed imaging and case with Dr. Verlee Monte, as Dr. Shearon Stalls is out of office and Dr. Verlee Monte last saw him in the hospital.  Advised mucociliary clearance therapies -okay to use flutter valve.  Recommended close follow-up.  Provided patient with strict ED precautions.  Patient Instructions  Continue Symbicort 2 puffs Twice daily. Brush tongue and rinse mouth afterwards Continue Albuterol inhaler 2 puffs or 3 mL neb every 6 hours as needed for shortness of breath or wheezing. Notify if symptoms persist despite rescue inhaler/neb use.  Continue Zyrtec 10 mg daily Continue Xyzal 1 tab daily for allergies Continue methotrexate 1 tab once a week, as previously prescribed Continue prednisone taper as directed. Continue bactrim 1 tab on Monday, Wednesday and Friday until you are down to 20 mg of prednisone then you can stop. Continue supplemental oxygen 3 lpm for goal>88-90%. Use your continuous portable tanks until you are seen back instead of your POC  Mucinex 600 mg Twice daily for chest congestion/cough Flutter valve 2-3 times a day. Do this after your inhaler   Small, frequent high protein meals. Continue Ensure supplements Continue working with PT/OT  Follow up in 2 weeks with Dr. Shearon Stalls. If symptoms do not improve or worsen, please contact office for sooner follow up or seek emergency care.     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  He has not been able to tolerate antifibrotic's in the past.  He is currently on  methotrexate and Rituxan (last dose of Rituxan was April 2023).  He is followed by Dr. Trudie Reed with rheumatology.  He is also followed by [facility] and is being evaluated for transplant.  He will need to put some weight back on before he is a candidate for this as they want BMI greater than 18.  Continue Symbicort and as needed albuterol.  Pneumomediastinum ([place]) Mild pneumomediastinum noted during his hospitalization.  They had backed off on PEP devices given this.  Reviewed today with Dr. Verlee Monte.  Improvement on imaging.  Advised that he could restart flutter valve 2-3 times a day to help with chest congestion.  Instructed to stop if he develop chest pain or discomfort.  Abnormal weight loss Continues to struggle with ongoing weight loss.  He is down about 7 pounds since discharge, which we discussed today.  He is drinking ensures 3 times a day.  Appetite is not great and they are curious about potential appetite stimulants.  Advised that I would discuss this with Dr. Shearon Stalls.  Encouraged small, frequent high-protein meals.  Instructed them to monitor his weight closely.  He was provided with strict ED precautions should any of his symptoms worsen.  Close follow-up.   I spent 42 minutes of dedicated to the care of this patient  on the date of this encounter to include pre-visit review of records, face-to-face time with the patient discussing conditions above, post visit ordering of testing, clinical documentation with the electronic health record, making appropriate referrals as documented, and communicating necessary findings to members of the patients care team.  Clayton Bibles, NP 01/31/2022  Pt aware and understands NP's role.

## 2022-01-31 NOTE — Assessment & Plan Note (Signed)
Severe underlying RA ILD.  He has not been able to tolerate antifibrotic's in the past.  He is currently on methotrexate and Rituxan (last dose of Rituxan was April 2023).  He is followed by Dr. Trudie Reed with rheumatology.  He is also followed by [facility] and is being evaluated for transplant.  He will need to put some weight back on before he is a candidate for this as they want BMI greater than 18.  Continue Symbicort and as needed albuterol.

## 2022-01-31 NOTE — Patient Instructions (Signed)
Continue Symbicort 2 puffs Twice daily. Brush tongue and rinse mouth afterwards Continue Albuterol inhaler 2 puffs or 3 mL neb every 6 hours as needed for shortness of breath or wheezing. Notify if symptoms persist despite rescue inhaler/neb use.  Continue Zyrtec 10 mg daily Continue Xyzal 1 tab daily for allergies Continue methotrexate 1 tab once a week, as previously prescribed Continue prednisone taper as directed. Continue bactrim 1 tab on Monday, Wednesday and Friday until you are down to 20 mg of prednisone then you can stop. Continue supplemental oxygen 3 lpm for goal>88-90%. Use your continuous portable tanks until you are seen back instead of your POC  Mucinex 600 mg Twice daily for chest congestion/cough Flutter valve 2-3 times a day. Do this after your inhaler   Small, frequent high protein meals. Continue Ensure supplements Continue working with PT/OT  Follow up in 2 weeks with Dr. Shearon Stalls. If symptoms do not improve or worsen, please contact office for sooner follow up or seek emergency care.

## 2022-02-01 DIAGNOSIS — J440 Chronic obstructive pulmonary disease with acute lower respiratory infection: Secondary | ICD-10-CM | POA: Diagnosis not present

## 2022-02-01 DIAGNOSIS — U071 COVID-19: Secondary | ICD-10-CM | POA: Diagnosis not present

## 2022-02-01 DIAGNOSIS — J8409 Other alveolar and parieto-alveolar conditions: Secondary | ICD-10-CM | POA: Diagnosis not present

## 2022-02-01 DIAGNOSIS — J982 Interstitial emphysema: Secondary | ICD-10-CM | POA: Diagnosis not present

## 2022-02-01 DIAGNOSIS — J9621 Acute and chronic respiratory failure with hypoxia: Secondary | ICD-10-CM | POA: Diagnosis not present

## 2022-02-01 DIAGNOSIS — J1282 Pneumonia due to coronavirus disease 2019: Secondary | ICD-10-CM | POA: Diagnosis not present

## 2022-02-05 ENCOUNTER — Ambulatory Visit: Payer: Medicare Other | Admitting: Internal Medicine

## 2022-02-06 ENCOUNTER — Telehealth: Payer: Self-pay | Admitting: Internal Medicine

## 2022-02-06 DIAGNOSIS — J440 Chronic obstructive pulmonary disease with acute lower respiratory infection: Secondary | ICD-10-CM | POA: Diagnosis not present

## 2022-02-06 DIAGNOSIS — U071 COVID-19: Secondary | ICD-10-CM | POA: Diagnosis not present

## 2022-02-06 DIAGNOSIS — J9621 Acute and chronic respiratory failure with hypoxia: Secondary | ICD-10-CM | POA: Diagnosis not present

## 2022-02-06 DIAGNOSIS — J1282 Pneumonia due to coronavirus disease 2019: Secondary | ICD-10-CM | POA: Diagnosis not present

## 2022-02-06 DIAGNOSIS — J982 Interstitial emphysema: Secondary | ICD-10-CM | POA: Diagnosis not present

## 2022-02-06 DIAGNOSIS — J8409 Other alveolar and parieto-alveolar conditions: Secondary | ICD-10-CM | POA: Diagnosis not present

## 2022-02-07 ENCOUNTER — Telehealth: Payer: Self-pay | Admitting: Internal Medicine

## 2022-02-07 MED ORDER — MIRTAZAPINE 15 MG PO TABS: 7.5000 mg | ORAL_TABLET | Freq: Every day | ORAL | 5 refills | Status: DC

## 2022-02-07 NOTE — Telephone Encounter (Signed)
Called him to discuss weight gain - was unable to reach him, wife, or home number. Left message on identified personal cell phone for instructions. Sending mirtazipime medication 15 mg once daily (at night) to help with weight gain.

## 2022-02-08 NOTE — Telephone Encounter (Signed)
error 

## 2022-02-10 ENCOUNTER — Other Ambulatory Visit: Payer: Self-pay | Admitting: Nurse Practitioner

## 2022-02-11 ENCOUNTER — Other Ambulatory Visit: Payer: Self-pay | Admitting: *Deleted

## 2022-02-11 MED ORDER — METHOTREXATE SODIUM 10 MG PO TABS: 10.0000 mg | ORAL_TABLET | ORAL | 2 refills | Status: DC

## 2022-02-12 ENCOUNTER — Other Ambulatory Visit (HOSPITAL_COMMUNITY): Payer: Self-pay

## 2022-02-13 ENCOUNTER — Telehealth: Payer: Self-pay | Admitting: Internal Medicine

## 2022-02-13 DIAGNOSIS — J849 Interstitial pulmonary disease, unspecified: Secondary | ICD-10-CM

## 2022-02-14 ENCOUNTER — Telehealth: Payer: Self-pay | Admitting: Emergency Medicine

## 2022-02-14 DIAGNOSIS — J8409 Other alveolar and parieto-alveolar conditions: Secondary | ICD-10-CM | POA: Diagnosis not present

## 2022-02-14 DIAGNOSIS — J440 Chronic obstructive pulmonary disease with acute lower respiratory infection: Secondary | ICD-10-CM | POA: Diagnosis not present

## 2022-02-14 DIAGNOSIS — J9621 Acute and chronic respiratory failure with hypoxia: Secondary | ICD-10-CM | POA: Diagnosis not present

## 2022-02-14 DIAGNOSIS — U071 COVID-19: Secondary | ICD-10-CM | POA: Diagnosis not present

## 2022-02-14 DIAGNOSIS — J1282 Pneumonia due to coronavirus disease 2019: Secondary | ICD-10-CM | POA: Diagnosis not present

## 2022-02-14 DIAGNOSIS — J982 Interstitial emphysema: Secondary | ICD-10-CM | POA: Diagnosis not present

## 2022-02-14 NOTE — Telephone Encounter (Signed)
[facility] needs verbal order for home health PT 1 x 5 weeks

## 2022-02-14 NOTE — Telephone Encounter (Signed)
Called Christine and left message at [place] in reference to verbal orders

## 2022-02-14 NOTE — Telephone Encounter (Signed)
Okay for orders as requested.  Thanks.

## 2022-02-15 ENCOUNTER — Encounter: Payer: Self-pay | Admitting: Internal Medicine

## 2022-02-15 NOTE — Telephone Encounter (Signed)
Called and spoke with pt letting him know that [place] was fine with [place] ordering pulm rehab and he verbalized understanding. Due to pt living in [place], he was requesting to have rehab performed in [place] if able to happen. Did specify [place] in order and stated to pt if not able to do it there that we would let him know and he verbalized understanding.nothing further needed.

## 2022-02-15 NOTE — Telephone Encounter (Signed)
Ok to place referral for pulmonary rehab for diagnosis ILD. I think he lives in [place] so double check with him But I think [place] would be preferred location.

## 2022-02-15 NOTE — Telephone Encounter (Signed)
Called and spoke with pt who wanted to know if he could have referral placed for pulmonary rehab. Dr. Shearon Stalls, please advise if you are okay with this being done.

## 2022-02-15 NOTE — Telephone Encounter (Signed)
Earl Thomas, please see mychart message sent by pt about FMLA.

## 2022-02-18 DIAGNOSIS — J849 Interstitial pulmonary disease, unspecified: Secondary | ICD-10-CM | POA: Diagnosis not present

## 2022-02-18 DIAGNOSIS — Z681 Body mass index (BMI) 19 or less, adult: Secondary | ICD-10-CM | POA: Diagnosis not present

## 2022-02-18 DIAGNOSIS — R5383 Other fatigue: Secondary | ICD-10-CM | POA: Diagnosis not present

## 2022-02-18 DIAGNOSIS — M0579 Rheumatoid arthritis with rheumatoid factor of multiple sites without organ or systems involvement: Secondary | ICD-10-CM | POA: Diagnosis not present

## 2022-02-18 DIAGNOSIS — Z79899 Other long term (current) drug therapy: Secondary | ICD-10-CM | POA: Diagnosis not present

## 2022-02-20 ENCOUNTER — Telehealth: Payer: Self-pay | Admitting: Internal Medicine

## 2022-02-20 ENCOUNTER — Telehealth: Payer: Self-pay | Admitting: Pulmonary Disease

## 2022-02-20 DIAGNOSIS — J440 Chronic obstructive pulmonary disease with acute lower respiratory infection: Secondary | ICD-10-CM | POA: Diagnosis not present

## 2022-02-20 DIAGNOSIS — J982 Interstitial emphysema: Secondary | ICD-10-CM | POA: Diagnosis not present

## 2022-02-20 DIAGNOSIS — J9621 Acute and chronic respiratory failure with hypoxia: Secondary | ICD-10-CM | POA: Diagnosis not present

## 2022-02-20 DIAGNOSIS — U071 COVID-19: Secondary | ICD-10-CM | POA: Diagnosis not present

## 2022-02-20 DIAGNOSIS — J1282 Pneumonia due to coronavirus disease 2019: Secondary | ICD-10-CM | POA: Diagnosis not present

## 2022-02-20 DIAGNOSIS — J8409 Other alveolar and parieto-alveolar conditions: Secondary | ICD-10-CM | POA: Diagnosis not present

## 2022-02-20 NOTE — Telephone Encounter (Signed)
I received an email from the patient's daughter, Dennys Guin, with the FMLA form attached.  This is for her to be absent from work for care of her father.  Below is the note from Ms. Lissa Merlin included in the email:  "Good afternoon we spoke eariler on the phone I filled out as much as i could. If you have any questions please contact me any time [phone]. My employee needs me to submit an idea of what is going on which is due friday 03/01/22. I gave mysefl a year buffer being that we do not have any exact days but generally speaking so that I do not have to resubmit again. After we get approval from [place] that he his eligible for transplant i will have that Doctor from [facility] sign off confirming further details. All infomation can be edited once submitted on my end. I would like for [place] to be as detailed as possibel so they have an understanding of things. If you give details of his condition that would be great also. Everything that i left blank is for you to fill out, again if you you have any questions i am happy to help so we can met this deadline."  I have printed the form and provided it to Dr. Shearon Stalls for review and signature when she is in office on 8/18.

## 2022-02-20 NOTE — Telephone Encounter (Signed)
Knibb, Darilyn Dorthula Rue, RN 1 hour ago (2:23 PM)   DK I have talked to patient's daughter Deno Lunger and she will be emailing me a FMLA for for her employer.  This is in addition to the FMLA form I'm working on for Mr. Aldaco' employer.

## 2022-02-20 NOTE — Telephone Encounter (Signed)
Returned call from patient's daughter, Earl Thomas.  She will email me a FMLA form for her employer so that she will be able to take time off to care for Earl Thomas.  In addition, on 8/11 we received a FMLA form via MyChart for Earl Thomas' employer, [employer].  I will prepare both forms for Dr. Mauricio Po review and signature.

## 2022-02-20 NOTE — Telephone Encounter (Signed)
I called patient to ask about his needs regarding FMLA.  He stated he has not yet been contacted by pulmonary rehab so he does not know how many sessions he will have and how many weeks they will go on.  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  He is still on continuous oxygen with any exertion at all (but not while sitting at home).  Maneuvering his portable o2 will be difficult.  He has asked that FMLA be filled out for him to be out of work full time for the Fall semester (Aug 21 - Jul 07, 2022).   I have completed as much of the form as I am able and will give it to Dr. Shearon Stalls for review and completion.

## 2022-02-22 DIAGNOSIS — Z7951 Long term (current) use of inhaled steroids: Secondary | ICD-10-CM | POA: Diagnosis not present

## 2022-02-22 DIAGNOSIS — M26629 Arthralgia of temporomandibular joint, unspecified side: Secondary | ICD-10-CM | POA: Diagnosis not present

## 2022-02-22 DIAGNOSIS — R5382 Chronic fatigue, unspecified: Secondary | ICD-10-CM | POA: Diagnosis not present

## 2022-02-22 DIAGNOSIS — Z87891 Personal history of nicotine dependence: Secondary | ICD-10-CM | POA: Diagnosis not present

## 2022-02-22 DIAGNOSIS — J440 Chronic obstructive pulmonary disease with acute lower respiratory infection: Secondary | ICD-10-CM | POA: Diagnosis not present

## 2022-02-22 DIAGNOSIS — R7302 Impaired glucose tolerance (oral): Secondary | ICD-10-CM | POA: Diagnosis not present

## 2022-02-22 DIAGNOSIS — E785 Hyperlipidemia, unspecified: Secondary | ICD-10-CM | POA: Diagnosis not present

## 2022-02-22 DIAGNOSIS — Z7982 Long term (current) use of aspirin: Secondary | ICD-10-CM | POA: Diagnosis not present

## 2022-02-22 DIAGNOSIS — J8409 Other alveolar and parieto-alveolar conditions: Secondary | ICD-10-CM | POA: Diagnosis not present

## 2022-02-22 DIAGNOSIS — I5032 Chronic diastolic (congestive) heart failure: Secondary | ICD-10-CM | POA: Diagnosis not present

## 2022-02-22 DIAGNOSIS — Z792 Long term (current) use of antibiotics: Secondary | ICD-10-CM | POA: Diagnosis not present

## 2022-02-22 DIAGNOSIS — U071 COVID-19: Secondary | ICD-10-CM | POA: Diagnosis not present

## 2022-02-22 DIAGNOSIS — M1990 Unspecified osteoarthritis, unspecified site: Secondary | ICD-10-CM | POA: Diagnosis not present

## 2022-02-22 DIAGNOSIS — Z9981 Dependence on supplemental oxygen: Secondary | ICD-10-CM | POA: Diagnosis not present

## 2022-02-22 DIAGNOSIS — J1282 Pneumonia due to coronavirus disease 2019: Secondary | ICD-10-CM | POA: Diagnosis not present

## 2022-02-22 DIAGNOSIS — J9621 Acute and chronic respiratory failure with hypoxia: Secondary | ICD-10-CM | POA: Diagnosis not present

## 2022-02-22 DIAGNOSIS — Z7952 Long term (current) use of systemic steroids: Secondary | ICD-10-CM | POA: Diagnosis not present

## 2022-02-22 DIAGNOSIS — N289 Disorder of kidney and ureter, unspecified: Secondary | ICD-10-CM | POA: Diagnosis not present

## 2022-02-22 DIAGNOSIS — K219 Gastro-esophageal reflux disease without esophagitis: Secondary | ICD-10-CM | POA: Diagnosis not present

## 2022-02-22 DIAGNOSIS — M0510 Rheumatoid lung disease with rheumatoid arthritis of unspecified site: Secondary | ICD-10-CM | POA: Diagnosis not present

## 2022-02-22 DIAGNOSIS — E43 Unspecified severe protein-calorie malnutrition: Secondary | ICD-10-CM | POA: Diagnosis not present

## 2022-02-22 DIAGNOSIS — J982 Interstitial emphysema: Secondary | ICD-10-CM | POA: Diagnosis not present

## 2022-02-22 DIAGNOSIS — I700 Atherosclerosis of aorta: Secondary | ICD-10-CM | POA: Diagnosis not present

## 2022-02-22 DIAGNOSIS — Z9181 History of falling: Secondary | ICD-10-CM | POA: Diagnosis not present

## 2022-02-22 DIAGNOSIS — I110 Hypertensive heart disease with heart failure: Secondary | ICD-10-CM | POA: Diagnosis not present

## 2022-02-22 NOTE — Telephone Encounter (Signed)
Dr. Shearon Stalls completed and signed FMLA forms for patient and his daughter Kinsley Nicklaus.  I spoke to the daughter earlier this afternoon and then emailed the forms to Mr. Cui at [email or web address] and Ms. Vick at [email or web address].  I advised Ms. Nunnery there would be a $29 processing fee added to Mr. Eslick' account.

## 2022-02-25 DIAGNOSIS — Z0289 Encounter for other administrative examinations: Secondary | ICD-10-CM

## 2022-02-25 DIAGNOSIS — R634 Abnormal weight loss: Secondary | ICD-10-CM | POA: Diagnosis not present

## 2022-02-27 DIAGNOSIS — J8409 Other alveolar and parieto-alveolar conditions: Secondary | ICD-10-CM | POA: Diagnosis not present

## 2022-02-27 DIAGNOSIS — J1282 Pneumonia due to coronavirus disease 2019: Secondary | ICD-10-CM | POA: Diagnosis not present

## 2022-02-27 DIAGNOSIS — J982 Interstitial emphysema: Secondary | ICD-10-CM | POA: Diagnosis not present

## 2022-02-27 DIAGNOSIS — J440 Chronic obstructive pulmonary disease with acute lower respiratory infection: Secondary | ICD-10-CM | POA: Diagnosis not present

## 2022-02-27 DIAGNOSIS — J9621 Acute and chronic respiratory failure with hypoxia: Secondary | ICD-10-CM | POA: Diagnosis not present

## 2022-02-27 DIAGNOSIS — U071 COVID-19: Secondary | ICD-10-CM | POA: Diagnosis not present

## 2022-02-28 ENCOUNTER — Telehealth (HOSPITAL_COMMUNITY): Payer: Self-pay

## 2022-02-28 NOTE — Telephone Encounter (Signed)
Called to confirm appt. Pt confirmed appt. Instructed pt on proper footwear and COVID screening. Gave directions along with department number.

## 2022-02-28 NOTE — Telephone Encounter (Signed)
Pt insurance is active and benefits verified through Medicare a/b Co-pay 0, DED $226/$226 met, out of pocket 0/0 met, co-insurance 20%. no pre-authorization required.  2ndary insurance is active and benefits verified through [employer]. Co-pay 0, DED 0/0 met, out of pocket 0/0 met, co-insurance 0. No pre-authorization required.

## 2022-03-01 ENCOUNTER — Encounter (HOSPITAL_COMMUNITY)
Admission: RE | Admit: 2022-03-01 | Discharge: 2022-03-01 | Disposition: A | Discharge status: Home / Self Care | Payer: Medicare Other | Source: Ambulatory Visit | Attending: Pulmonary Disease | Admitting: Pulmonary Disease

## 2022-03-01 ENCOUNTER — Encounter (HOSPITAL_COMMUNITY): Payer: Self-pay

## 2022-03-01 VITALS — BP 98/58 | HR 110 | Ht 65.0 in | Wt 98.3 lb

## 2022-03-01 DIAGNOSIS — R0902 Hypoxemia: Secondary | ICD-10-CM | POA: Diagnosis not present

## 2022-03-01 NOTE — Progress Notes (Signed)
Pulmonary Rehab Orientation Physical Assessment Note  Physical assessment reveals  Pt  who is accompanied by his wife is alert and oriented x 3.  Heart rate is normal, breath sounds clear to auscultation, no wheezes, rales, or rhonchi however diminished throughout. Reports non-productive cough. Bowel sounds present. Pt denies abdominal discomfort, vomiting or diarrhea however he does have nausea occasionally.  Poor appetite with ensure and whole milk smoothies. Grip strength equal, strong. Distal pulses palpable;  no swelling to lower extremities. Cherre Huger, BSN Cardiac and Training and development officer

## 2022-03-01 NOTE — Progress Notes (Signed)
Earl Thomas 71 y.o. male Pulmonary Rehab Orientation Note This patient who was referred to Pulmonary Rehab by Dr. Maceo Pro [name] coverage) with the diagnosis of Hypoxemia arrived today in Cardiac and Pulmonary Rehab. He arrived ambulatory with normal gait. He does carry portable oxygen. [employer] is the provider for their DME. Per patient, Earl Thomas uses oxygen continuously. Color good, skin warm and dry. Patient is oriented to time and place. Patient's medical history, psychosocial health, and medications reviewed. Psychosocial assessment reveals patient lives with spouse. 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 Patient hobbies include watching tv. Patient reports his stress level is low. Areas of stress/anxiety include  N/A . Patient does not exhibit signs of depression. PHQ2/9 score 0/0. Earl Thomas shows good  coping skills with positive outlook on life. Offered emotional support and reassurance. Will continue to monitor. Physical assessment performed by Earl Small RN. Please see their orientation physical assessment note. Earl Thomas reports he  does take medications as prescribed. Patient states he  follows a regular  diet. The patient has been trying to gain weight by increasing caloric intake. Patient's weight will be monitored closely. Demonstration and practice of PLB using pulse oximeter. Earl Thomas able to return demonstration satisfactorily. Safety and hand hygiene in the exercise area reviewed with patient. Earl Thomas voices understanding of the information reviewed. Department expectations discussed with patient and achievable goals were set. The patient shows enthusiasm about attending the program and we look forward to working with Earl Thomas. Earl Thomas completed a 6 min walk test today and is scheduled to begin exercise on 03/07/22 at 1:15 pm.   [id number] Earl Plumber, MS, ACSM-CEP

## 2022-03-01 NOTE — Progress Notes (Signed)
Pulmonary Individual Treatment Plan  Patient Details  Name: Earl Thomas MRN: [id number] Date of Birth: [birth year] Referring Provider:   [name] Pulmonary Rehab Walk Test from 03/01/2022 in [place]  Referring Provider Maceo Pro  [[place]]       Initial Encounter Date:  Flowsheet Row Pulmonary Rehab Walk Test from 03/01/2022 in [place]  Date 03/01/22       Visit Diagnosis: Hypoxemia  Patient's Home Medications on Admission:   Current Outpatient Medications:    acetaminophen (TYLENOL) 500 MG tablet, Take 1,000 mg by mouth every 6 (six) hours as needed for headache (pain)., Disp: , Rfl:    albuterol (VENTOLIN HFA) 108 (90 Base) MCG/ACT inhaler, Inhale 2 puffs into the lungs every 6 (six) hours as needed for wheezing or shortness of breath., Disp: 18 g, Rfl: 5   aspirin EC 81 MG tablet, Take 81 mg by mouth every morning., Disp: , Rfl:    azelastine (ASTELIN) 0.1 % nasal spray, Place 2 sprays into both nostrils 2 (two) times daily. Use in each nostril as directed (Patient taking differently: Place 2 sprays into both nostrils 2 (two) times daily as needed for rhinitis or allergies (congestion). Use in each nostril as directed), Disp: 30 mL, Rfl: 12   Brimonidine Tartrate (LUMIFY) 0.025 % SOLN, Place 1 drop into both eyes every Sunday., Disp: , Rfl:    cetirizine (ZYRTEC) 10 MG tablet, Take 10 mg by mouth daily., Disp: , Rfl:    diclofenac sodium (VOLTAREN) 1 % GEL, Apply 2 g topically 4 (four) times daily. (Patient taking differently: Apply 2 g topically 4 (four) times daily as needed (pain).), Disp: 100 g, Rfl: 1   famotidine (PEPCID) 20 MG tablet, Take 1 tablet (20 mg total) by mouth 2 (two) times daily., Disp: 90 tablet, Rfl: 3   folic acid ([name]) 1 MG tablet, Take 1 mg by mouth every morning., Disp: , Rfl:    guaifenesin (ROBITUSSIN) 100 MG/5ML syrup, Take 200 mg by mouth 3 (three) times daily as needed for  cough or congestion., Disp: , Rfl:    levocetirizine (XYZAL) 5 MG tablet, Take 1 tablet (5 mg total) by mouth every evening., Disp: 30 tablet, Rfl: 5   loperamide (IMODIUM) 2 MG capsule, Take 2 mg by mouth daily as needed for diarrhea or loose stools., Disp: , Rfl:    meloxicam (MOBIC) 7.5 MG tablet, Take 1 tablet (7.5 mg total) by mouth daily as needed for pain., Disp: 30 tablet, Rfl: 2   methotrexate (RHEUMATREX) 10 MG tablet, Take 1 tablet (10 mg total) by mouth once a week. Caution: Chemotherapy. Protect from light., Disp: 4 tablet, Rfl: 2   mirtazapine (REMERON) 15 MG tablet, Take 0.5 tablets (7.5 mg total) by mouth at bedtime., Disp: 30 tablet, Rfl: 5   Naphazoline HCl (CLEAR EYES OP), Place 1 drop into both eyes daily., Disp: , Rfl:    nitroGLYCERIN (NITROSTAT) 0.4 MG SL tablet, Place 1 tablet (0.4 mg total) under the tongue every 5 (five) minutes as needed. (Patient taking differently: Place 0.4 mg under the tongue every 5 (five) minutes as needed for chest pain.), Disp: 25 tablet, Rfl: 6   predniSONE (DELTASONE) 10 MG tablet, Take 6 tabs (24m) by mouth daily for 1 week, followed by 5 tabs daily for 1 week, then 4 tabs daily for 1 week, then 3 tabs daily, Pulmonary physician to titrate., Disp: 132 tablet, Rfl: 0   riTUXimab (RITUXAN IV),  Inject into the vein., Disp: , Rfl:    sulfamethoxazole-trimethoprim (BACTRIM DS) 800-160 MG tablet, Take 1 pill on Monday, Wednesday, and Friday of each week, Disp: 15 tablet, Rfl: 0   SYMBICORT 80-4.5 MCG/ACT inhaler, Inhale 2 puffs by mouth twice daily (Patient taking differently: Inhale 2 puffs into the lungs 2 (two) times daily as needed (shortness of breath/wheezing).), Disp: 11 g, Rfl: 0   vitamin C (ASCORBIC ACID) 500 MG tablet, Take 500 mg by mouth daily after supper., Disp: , Rfl:   Past Medical History: Past Medical History:  Diagnosis Date   Abnormal weight loss 12/04/2021   Acute interstitial pneumonitis ([place]) 12/04/2021   Aortic  atherosclerosis ([place]) 12/04/2021   Arthralgia of temporomandibular joint 12/04/2021   Arthritis    BMI 26.0-26.9,adult 10/22/2016   Chronic fatigue syndrome 12/04/2021   Dyslipidemia 03/10/2012   Family history of prostate cancer 12/04/2021   Glucose intolerance (impaired glucose tolerance)    Hyperlipidemia    Interstitial lung disease ([place]) 07/16/2019   Long-term current use of high risk medication other than anticoagulant 07/16/2019   Other long term (current) drug therapy 12/04/2021   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  Smoked a pip in college, not daily.    Labs: Review Flowsheet  More data exists      Latest Ref Rng & Units 05/14/2019 09/07/2019 03/13/2021 12/04/2021 01/19/2022  Labs for ITP Cardiac and Pulmonary Rehab  Cholestrol 0 - 200 mg/dL 246  108  155  119  -  LDL (calc) 0 - 99 mg/dL 193  65  100  72  -  HDL-C >39.00 mg/dL 39  31  45.00  34.40  -  Trlycerides 0.0 - 149.0 mg/dL 82  51  53.0  64.0  -  Hemoglobin A1c 4.0 - 5.6 % 6.2  5.9  - - -  Bicarbonate 20.0 - 28.0 mmol/L - - - - 28.9   O2 Saturation % - - - - 99.2     Capillary Blood Glucose: No results found for: "GLUCAP"   Pulmonary Assessment Scores:  Pulmonary Assessment Scores     Row Name 03/01/22 0954         ADL UCSD   ADL Phase Entry     SOB Score total 29       CAT Score   CAT Score 22       mMRC Score   mMRC Score 3             UCSD: Self-administered rating of dyspnea associated with activities of daily living (ADLs) 6-point scale (0 =  "not at all" to 5 = "maximal or unable to do because of breathlessness")  Scoring Scores range from 0 to 120.  Minimally important difference is 5 units  CAT: CAT can identify the health impairment of COPD patients and is better correlated with disease progression.  CAT has a scoring range of zero to 40. The CAT score is classified into four groups of low (less than 10), medium (10 -  20), high (21-30) and very high (31-40) based on the impact level of disease on health status. A CAT score over 10 suggests significant symptoms.  A worsening CAT score could be explained by an exacerbation, poor medication adherence, poor inhaler technique, or progression of COPD or comorbid conditions.  CAT MCID is 2 points  mMRC: mMRC (Modified Medical Research Council) Dyspnea Scale is used to assess the degree of baseline functional disability in patients of respiratory disease due to dyspnea. No minimal important difference is established. A decrease in score of 1 point or greater is considered a positive change.   Pulmonary Function Assessment:  Pulmonary Function Assessment - 03/01/22 0954       Breath   Bilateral Breath Sounds Clear;Decreased    Shortness of Breath Yes;Limiting activity             Exercise Target Goals: Exercise Program Goal: Individual exercise prescription set using results from initial 6 min walk test and THRR while considering  patient's activity barriers and safety.   Exercise Prescription Goal: Initial exercise prescription builds to 30-45 minutes a day of aerobic activity, 2-3 days per week.  Home exercise guidelines will be given to patient during program as part of exercise prescription that the participant will acknowledge.  Activity Barriers & Risk Stratification:  Activity Barriers & Cardiac Risk Stratification - 03/01/22 0955       Activity Barriers & Cardiac Risk Stratification   Activity Barriers Arthritis;Deconditioning;Muscular Weakness;Shortness of Breath              6 Minute Walk:  6 Minute Walk     Row Name 03/01/22 1054         6 Minute Walk   Phase Initial     Distance 1252 feet     Walk Time 6 minutes     # of Rest Breaks 0     MPH 2.37     METS 3.72     RPE 12     Perceived Dyspnea  2     VO2 Peak 13.02     Symptoms No     Resting HR 110 bpm     Resting BP 98/58     Resting Oxygen Saturation  97 %     Exercise Oxygen Saturation  during 6 min walk 90 %     Max Ex. HR 122 bpm     Max Ex. BP 132/60     2 Minute Post BP 100/50       Interval HR   1 Minute HR 117     2 Minute HR 119     3 Minute HR 120     4 Minute HR 120     5 Minute HR 119     6 Minute HR 122     2 Minute Post HR 101     Interval Heart Rate? Yes       Interval Oxygen   Interval Oxygen? Yes     Baseline Oxygen Saturation % 97 %     1 Minute Oxygen Saturation % 97 %     1 Minute Liters of Oxygen 2 L     2 Minute Oxygen Saturation % 96 %     2 Minute Liters of Oxygen 2 L     3 Minute Oxygen Saturation % 97 %     3 Minute Liters of Oxygen 2 L     4 Minute Oxygen Saturation % 93 %     4  Minute Liters of Oxygen 2 L     5 Minute Oxygen Saturation % 90 %     5 Minute Liters of Oxygen 2 L     6 Minute Oxygen Saturation % 93 %     6 Minute Liters of Oxygen 2 L     2 Minute Post Oxygen Saturation % 93 %     2 Minute Post Liters of Oxygen 2 L              Oxygen Initial Assessment:  Oxygen Initial Assessment - 03/01/22 0952       Home Oxygen   Home Oxygen Device Portable Concentrator;Home Concentrator    Sleep Oxygen Prescription Continuous    Liters per minute 2    Home Exercise Oxygen Prescription Continuous    Liters per minute 2    Home Resting Oxygen Prescription Continuous    Liters per minute 2    Compliance with Home Oxygen Use Yes      Initial 6 min Walk   Oxygen Used Continuous    Liters per minute 2      Program Oxygen Prescription   Program Oxygen Prescription Continuous    Liters per minute 2       Intervention   Short Term Goals To learn and exhibit compliance with exercise, home and travel O2 prescription;To learn and understand importance of monitoring SPO2 with pulse oximeter and demonstrate accurate use of the pulse oximeter.;To learn and understand importance of maintaining oxygen saturations>88%;To learn and demonstrate proper pursed lip breathing techniques or other breathing techniques. ;To learn and demonstrate proper use of respiratory medications    Long  Term Goals Exhibits compliance with exercise, home  and travel O2 prescription;Verbalizes importance of monitoring SPO2 with pulse oximeter and return demonstration;Maintenance of O2 saturations>88%;Exhibits proper breathing techniques, such as pursed lip breathing or other method taught during program session;Compliance with respiratory medication;Demonstrates proper use of MDI's             Oxygen Re-Evaluation:   Oxygen Discharge (Final Oxygen Re-Evaluation):   Initial Exercise Prescription:  Initial Exercise Prescription - 03/01/22 1100       Date of Initial Exercise RX and Referring Provider   Date 03/01/22    Referring Provider [name]   Expected Discharge Date 05/09/22      Oxygen   Oxygen Continuous    Liters 2    Maintain Oxygen Saturation 88% or higher      Recumbant Bike   Level 1    Watts 24    Minutes 15      NuStep   Level 1    SPM 60    Minutes 15      Prescription Details   Frequency (times per week) 2    Duration Progress to 30 minutes of continuous aerobic without signs/symptoms of physical distress      Intensity   THRR 40-80% of Max Heartrate 61-122    Ratings of Perceived Exertion 11-13    Perceived Dyspnea 0-4      Progression   Progression Continue to progress workloads to maintain intensity without signs/symptoms of physical distress.      Resistance Training   Training Prescription Yes    Weight red bands    Reps 10-15             Perform Capillary  Blood Glucose checks as needed.  Exercise Prescription Changes:   Exercise Comments:   Exercise Goals and Review:  Exercise Goals     Row Name 03/01/22 0955             Exercise Goals   Increase Physical Activity Yes       Intervention Provide advice, education, support and counseling about physical activity/exercise needs.;Develop an individualized exercise prescription for aerobic and resistive training based on initial evaluation findings, risk stratification, comorbidities and participant's personal goals.       Expected Outcomes Short Term: Attend rehab on a regular basis to increase amount of physical activity.;Long Term: Add in home exercise to make exercise part of routine and to increase amount of physical activity.;Long Term: Exercising regularly at least 3-5 days a week.       Increase Strength and Stamina Yes       Intervention Provide advice, education, support and counseling about physical activity/exercise needs.;Develop an individualized exercise prescription for aerobic and resistive training based on initial evaluation findings, risk stratification, comorbidities and participant's personal goals.       Expected Outcomes Short Term: Increase workloads from initial exercise prescription for resistance, speed, and METs.;Short Term: Perform resistance training exercises routinely during rehab and add in resistance training at home;Long Term: Improve cardiorespiratory fitness, muscular endurance and strength as measured by increased METs and functional capacity (6MWT)       Able to understand and use rate of perceived exertion (RPE) scale Yes       Intervention Provide education and explanation on how to use RPE scale       Expected Outcomes Short Term: Able to use RPE daily in rehab to express subjective intensity level;Long Term:  Able to use RPE to guide intensity level when exercising independently       Able to understand and use Dyspnea scale Yes       Intervention  Provide education and explanation on how to use Dyspnea scale       Expected Outcomes Short Term: Able to use Dyspnea scale daily in rehab to express subjective sense of shortness of breath during exertion;Long Term: Able to use Dyspnea scale to guide intensity level when exercising independently       Knowledge and understanding of Target Heart Rate Range (THRR) Yes       Intervention Provide education and explanation of THRR including how the numbers were predicted and where they are located for reference       Expected Outcomes Short Term: Able to state/look up THRR;Long Term: Able to use THRR to govern intensity when exercising independently;Short Term: Able to use daily as guideline for intensity in rehab       Understanding of Exercise Prescription Yes       Intervention Provide education, explanation, and written materials on patient's individual exercise prescription       Expected Outcomes Short Term: Able to explain program exercise prescription;Long Term: Able to explain home exercise prescription to exercise independently                Exercise Goals Re-Evaluation :   Discharge Exercise Prescription (Final Exercise Prescription Changes):   Nutrition:  Target Goals: Understanding of nutrition guidelines, daily intake of sodium <1560m, cholesterol <206m calories 30% from fat and 7% or less from saturated fats, daily to have 5 or more servings of fruits and vegetables.  Biometrics:  Pre Biometrics - 03/01/22 0941       Pre Biometrics   Grip Strength 20 kg              Nutrition Therapy  Plan and Nutrition Goals:   Nutrition Assessments:  MEDIFICTS Score Key: ?70 Need to make dietary changes  40-70 Heart Healthy Diet ? 40 Therapeutic Level Cholesterol Diet   Picture Your Plate Scores: <37 Unhealthy dietary pattern with much room for improvement. 41-50 Dietary pattern unlikely to meet recommendations for good health and room for improvement. 51-60 More  healthful dietary pattern, with some room for improvement.  >60 Healthy dietary pattern, although there may be some specific behaviors that could be improved.    Nutrition Goals Re-Evaluation:   Nutrition Goals Discharge (Final Nutrition Goals Re-Evaluation):   Psychosocial: Target Goals: Acknowledge presence or absence of significant depression and/or stress, maximize coping skills, provide positive support system. Participant is able to verbalize types and ability to use techniques and skills needed for reducing stress and depression.  Initial Review & Psychosocial Screening:  Initial Psych Review & Screening - 03/01/22 0949       Initial Review   Current issues with None Identified      Family Dynamics   Good Support System? Yes    Comments wife and children, friends      Barriers   Psychosocial barriers to participate in program There are no identifiable barriers or psychosocial needs.      Screening Interventions   Interventions Encouraged to exercise             Quality of Life Scores:  Scores of 19 and below usually indicate a poorer quality of life in these areas.  A difference of  2-3 points is a clinically meaningful difference.  A difference of 2-3 points in the total score of the Quality of Life Index has been associated with significant improvement in overall quality of life, self-image, physical symptoms, and general health in studies assessing change in quality of life.  PHQ-9: Review Flowsheet  More data exists      03/01/2022 01/28/2022 12/04/2021 11/19/2021 06/06/2021  Depression screen PHQ 2/9  Decreased Interest 0 0 0 0 0  Down, Depressed, Hopeless 0 0 0 0 0  PHQ - 2 Score 0 0 0 0 0  Altered sleeping 0 - - - -  Tired, decreased energy 0 - - - -  Change in appetite 0 - - - -  Feeling bad or failure about yourself  0 - - - -  Trouble concentrating 0 - - - -  Moving slowly or fidgety/restless 0 - - - -  Suicidal thoughts 0 - - - -  PHQ-9 Score 0 -  - - -   Interpretation of Total Score  Total Score Depression Severity:  1-4 = Minimal depression, 5-9 = Mild depression, 10-14 = Moderate depression, 15-19 = Moderately severe depression, 20-27 = Severe depression   Psychosocial Evaluation and Intervention:  Psychosocial Evaluation - 03/01/22 0949       Psychosocial Evaluation & Interventions   Interventions Encouraged to exercise with the program and follow exercise prescription    Expected Outcomes For Lorris to participate in Pulmonary Rehab    Continue Psychosocial Services  Follow up required by staff             Psychosocial Re-Evaluation:   Psychosocial Discharge (Final Psychosocial Re-Evaluation):   Education: Education Goals: Education classes will be provided on a weekly basis, covering required topics. Participant will state understanding/return demonstration of topics presented.  Learning Barriers/Preferences:  Learning Barriers/Preferences - 03/01/22 0950       Learning Barriers/Preferences   Learning Barriers Sight   wears glasses  Learning Preferences None             Education Topics: Risk Factor Reduction:  -Group instruction that is supported by a PowerPoint presentation. Instructor discusses the definition of a risk factor, different risk factors for pulmonary disease, and how the heart and lungs work together.     Nutrition for Pulmonary Patient:  -Group instruction provided by PowerPoint slides, verbal discussion, and written materials to support subject matter. The instructor gives an explanation and review of healthy diet recommendations, which includes a discussion on weight management, recommendations for fruit and vegetable consumption, as well as protein, fluid, caffeine, fiber, sodium, sugar, and alcohol. Tips for eating when patients are short of breath are discussed.   Pursed Lip Breathing:  -Group instruction that is supported by demonstration and informational handouts.  Instructor discusses the benefits of pursed lip and diaphragmatic breathing and detailed demonstration on how to preform both.     Oxygen Safety:  -Group instruction provided by PowerPoint, verbal discussion, and written material to support subject matter. There is an overview of "What is Oxygen" and "Why do we need it".  Instructor also reviews how to create a safe environment for oxygen use, the importance of using oxygen as prescribed, and the risks of noncompliance. There is a brief discussion on traveling with oxygen and resources the patient may utilize.   Oxygen Equipment:  -Group instruction provided by [facility] Staff utilizing handouts, written materials, and equipment demonstrations.   Signs and Symptoms:  -Group instruction provided by written material and verbal discussion to support subject matter. Warning signs and symptoms of infection, stroke, and heart attack are reviewed and when to call the physician/911 reinforced. Tips for preventing the spread of infection discussed.   Advanced Directives:  -Group instruction provided by verbal instruction and written material to support subject matter. Instructor reviews Advanced Directive laws and proper instruction for filling out document.   Pulmonary Video:  -Group video education that reviews the importance of medication and oxygen compliance, exercise, good nutrition, pulmonary hygiene, and pursed lip and diaphragmatic breathing for the pulmonary patient.   Exercise for the Pulmonary Patient:  -Group instruction that is supported by a PowerPoint presentation. Instructor discusses benefits of exercise, core components of exercise, frequency, duration, and intensity of an exercise routine, importance of utilizing pulse oximetry during exercise, safety while exercising, and options of places to exercise outside of rehab.     Pulmonary Medications:  -Verbally interactive group education provided by instructor with focus on  inhaled medications and proper administration.   Anatomy and Physiology of the Respiratory System and Intimacy:  -Group instruction provided by PowerPoint, verbal discussion, and written material to support subject matter. Instructor reviews respiratory cycle and anatomical components of the respiratory system and their functions. Instructor also reviews differences in obstructive and restrictive respiratory diseases with examples of each. Intimacy, Sex, and Sexuality differences are reviewed with a discussion on how relationships can change when diagnosed with pulmonary disease. Common sexual concerns are reviewed.   MD DAY -A group question and answer session with a medical doctor that allows participants to ask questions that relate to their pulmonary disease state.   OTHER EDUCATION -Group or individual verbal, written, or video instructions that support the educational goals of the pulmonary rehab program.   Holiday Eating Survival Tips:  -Group instruction provided by PowerPoint slides, verbal discussion, and written materials to support subject matter. The instructor gives patients tips, tricks, and techniques to help them not only survive but  enjoy the holidays despite the onslaught of food that accompanies the holidays.   Knowledge Questionnaire Score:  Knowledge Questionnaire Score - 03/01/22 0959       Knowledge Questionnaire Score   Pre Score 15/18             Core Components/Risk Factors/Patient Goals at Admission:  Personal Goals and Risk Factors at Admission - 03/01/22 0951       Core Components/Risk Factors/Patient Goals on Admission    Weight Management Weight Gain    Improve shortness of breath with ADL's Yes    Intervention Provide education, individualized exercise plan and daily activity instruction to help decrease symptoms of SOB with activities of daily living.    Expected Outcomes Short Term: Improve cardiorespiratory fitness to achieve a reduction of  symptoms when performing ADLs;Long Term: Be able to perform more ADLs without symptoms or delay the onset of symptoms             Core Components/Risk Factors/Patient Goals Review:    Core Components/Risk Factors/Patient Goals at Discharge (Final Review):    ITP Comments: Dr. Rodman Pickle is Medical Director for Pulmonary Rehab at [facility].

## 2022-03-05 ENCOUNTER — Ambulatory Visit: Payer: Medicare Other | Admitting: Emergency Medicine

## 2022-03-06 DIAGNOSIS — J982 Interstitial emphysema: Secondary | ICD-10-CM | POA: Diagnosis not present

## 2022-03-06 DIAGNOSIS — J1282 Pneumonia due to coronavirus disease 2019: Secondary | ICD-10-CM | POA: Diagnosis not present

## 2022-03-06 DIAGNOSIS — J8409 Other alveolar and parieto-alveolar conditions: Secondary | ICD-10-CM | POA: Diagnosis not present

## 2022-03-06 DIAGNOSIS — J440 Chronic obstructive pulmonary disease with acute lower respiratory infection: Secondary | ICD-10-CM | POA: Diagnosis not present

## 2022-03-06 DIAGNOSIS — U071 COVID-19: Secondary | ICD-10-CM | POA: Diagnosis not present

## 2022-03-06 DIAGNOSIS — J9621 Acute and chronic respiratory failure with hypoxia: Secondary | ICD-10-CM | POA: Diagnosis not present

## 2022-03-07 ENCOUNTER — Encounter (HOSPITAL_COMMUNITY)
Admission: RE | Admit: 2022-03-07 | Discharge: 2022-03-07 | Disposition: A | Discharge status: Home / Self Care | Payer: Medicare Other | Source: Ambulatory Visit | Attending: Pulmonary Disease | Admitting: Pulmonary Disease

## 2022-03-07 DIAGNOSIS — R0902 Hypoxemia: Secondary | ICD-10-CM | POA: Diagnosis not present

## 2022-03-07 NOTE — Progress Notes (Signed)
Daily Session Note  Patient Details  Name: Earl Thomas MRN: [id number] Date of Birth: [birth year] Referring Provider:   [name] Pulmonary Rehab Walk Test from 03/01/2022 in [place]  Referring Provider [name]       Encounter Date: 03/07/2022  Check In:  Session Check In - 03/07/22 1418       Check-In   Supervising physician immediately available to respond to emergencies [facility] immediately available    Physician(s) Dr. Verlon Au    Location [facility]    Staff Present Rosebud Poles, RN, BSN;Jamieson Lisa Olen Cordial BS, ACSM-CEP, Exercise Physiologist;Kaylee Rosana Hoes, MS, ACSM-CEP, Exercise Physiologist;Lisa Ysidro Evert, RN    Virtual Visit No    Medication changes reported     No    Fall or balance concerns reported    No    Tobacco Cessation No Change    Warm-up and Cool-down Performed as group-led instruction    Resistance Training Performed Yes    VAD Patient? No    PAD/SET Patient? No      Pain Assessment   Currently in Pain? No/denies    Multiple Pain Sites No             Capillary Blood Glucose: No results found for this or any previous visit (from the past 24 hour(s)).    Social History   Tobacco Use  Smoking Status Former   Types: Pipe   Quit date: 10/06/1988   Years since quitting: 33.4   Passive exposure: Never  Smokeless Tobacco Never  Tobacco Comments   parent smoked in home as a child.  Smoked a pip in college, not daily.    Goals Met:  Exercise tolerated well No report of concerns or symptoms today Strength training completed today  Goals Unmet:  Not Applicable  Comments: Service time is from 1311 to 1445.    Dr. Rodman Pickle is Medical Director for Pulmonary Rehab at [facility].

## 2022-03-08 ENCOUNTER — Telehealth: Payer: Self-pay | Admitting: Internal Medicine

## 2022-03-08 NOTE — Telephone Encounter (Signed)
Called and spoke with pt's daughter Deno Lunger who is requesting to have another application filled out for handicap placard as she said along with her mother, she will also be bringing pt to appts. She said she did not look at the form prior to her mother turning it in to the [facility] so she could mark more than one copy on the form.  Dr. Shearon Stalls, please advise if you are okay with [place] filling out another form.

## 2022-03-12 ENCOUNTER — Encounter (HOSPITAL_COMMUNITY)
Admission: RE | Admit: 2022-03-12 | Discharge: 2022-03-12 | Disposition: A | Discharge status: Home / Self Care | Payer: Medicare Other | Source: Ambulatory Visit | Attending: Pulmonary Disease | Admitting: Pulmonary Disease

## 2022-03-12 VITALS — Wt 101.9 lb

## 2022-03-12 DIAGNOSIS — R0902 Hypoxemia: Secondary | ICD-10-CM | POA: Diagnosis not present

## 2022-03-12 NOTE — Progress Notes (Signed)
Daily Session Note  Patient Details  Name: Earl Thomas MRN: [id number] Date of Birth: 03-12-1951 Referring Provider:   April Thomas Pulmonary Rehab Walk Test from 03/01/2022 in [place]  Referring Provider Earl Thomas       Encounter Date: 03/12/2022  Check In:  Session Check In - 03/12/22 1454       Check-In   Supervising physician immediately available to respond to emergencies [facility] immediately available    Physician(s) Earl Thomas    Location [facility]    Staff Present Earl Poles, RN, Earl Ore, MS, ACSM-CEP, Exercise Physiologist;Earl Thomas, ACSM-CEP, Exercise Physiologist;Earl Ysidro Evert, RN    Virtual Visit No    Medication changes reported     No    Fall or balance concerns reported    No    Tobacco Cessation No Change    Warm-up and Cool-down Performed as group-led instruction    Resistance Training Performed Yes    VAD Patient? No    PAD/SET Patient? No      Pain Assessment   Currently in Pain? No/denies    Multiple Pain Sites No             Capillary Blood Glucose: No results found for this or any previous visit (from the past 24 hour(s)).   Exercise Prescription Changes - 03/12/22 1500       Response to Exercise   Blood Pressure (Admit) 98/60    Blood Pressure (Exercise) 110/60    Blood Pressure (Exit) 100/54    Heart Rate (Admit) 128 bpm    Heart Rate (Exercise) 140 bpm    Heart Rate (Exit) 125 bpm    Oxygen Saturation (Admit) 97 %    Oxygen Saturation (Exercise) 93 %    Oxygen Saturation (Exit) 96 %    Rating of Perceived Exertion (Exercise) 13    Perceived Dyspnea (Exercise) 1    Duration Continue with 30 min of aerobic exercise without signs/symptoms of physical distress.    Intensity THRR unchanged      Progression   Progression Continue to progress workloads to maintain intensity without signs/symptoms of physical distress.      Resistance Training    Training Prescription Yes    Weight red bands    Reps 10-15    Time 10 Minutes      Oxygen   Oxygen Continuous    Liters 2      Recumbant Bike   Level 1    Minutes 15    METs 1.9      NuStep   Level 1    SPM 60    Minutes 15    METs 1.2      Oxygen   Maintain Oxygen Saturation 88% or higher             Social History   Tobacco Use  Smoking Status Former   Types: Pipe   Quit date: 10/06/1988   Years since quitting: 33.4   Passive exposure: Never  Smokeless Tobacco Never  Tobacco Comments   parent smoked in home as a child.  Smoked a pip in college, not daily.    Goals Met:  Proper associated with RPD/PD & O2 Sat Exercise tolerated well No report of concerns or symptoms today Strength training completed today  Goals Unmet:  Not Applicable  Comments: Service time is from 1321 to 1445.    Earl Thomas is Medical Director for Pulmonary Rehab  at [facility].

## 2022-03-13 ENCOUNTER — Other Ambulatory Visit (HOSPITAL_COMMUNITY): Payer: Self-pay

## 2022-03-13 NOTE — Telephone Encounter (Signed)
Called and spoke with pt's daughter Deno Lunger letting her know that [place] was fine with [place] doing another placard application for pt and she verbalized understanding. Application has been obtained. Will place in mail once signed by [place]. Nothing further needed.

## 2022-03-13 NOTE — Progress Notes (Signed)
Pulmonary Individual Treatment Plan  Patient Details  Name: Earl Thomas MRN: [id number] Date of Birth: [birth year] Referring Provider:   [name] Pulmonary Rehab Walk Test from 03/01/2022 in [place]  Referring Provider Maceo Pro  [[place]]       Initial Encounter Date:  Flowsheet Row Pulmonary Rehab Walk Test from 03/01/2022 in [place]  Date 03/01/22       Visit Diagnosis: Hypoxemia  Patient's Home Medications on Admission:   Current Outpatient Medications:    acetaminophen (TYLENOL) 500 MG tablet, Take 1,000 mg by mouth every 6 (six) hours as needed for headache (pain)., Disp: , Rfl:    albuterol (VENTOLIN HFA) 108 (90 Base) MCG/ACT inhaler, Inhale 2 puffs into the lungs every 6 (six) hours as needed for wheezing or shortness of breath., Disp: 18 g, Rfl: 5   aspirin EC 81 MG tablet, Take 81 mg by mouth every morning., Disp: , Rfl:    azelastine (ASTELIN) 0.1 % nasal spray, Place 2 sprays into both nostrils 2 (two) times daily. Use in each nostril as directed (Patient taking differently: Place 2 sprays into both nostrils 2 (two) times daily as needed for rhinitis or allergies (congestion). Use in each nostril as directed), Disp: 30 mL, Rfl: 12   Brimonidine Tartrate (LUMIFY) 0.025 % SOLN, Place 1 drop into both eyes every Sunday., Disp: , Rfl:    cetirizine (ZYRTEC) 10 MG tablet, Take 10 mg by mouth daily., Disp: , Rfl:    diclofenac sodium (VOLTAREN) 1 % GEL, Apply 2 g topically 4 (four) times daily. (Patient taking differently: Apply 2 g topically 4 (four) times daily as needed (pain).), Disp: 100 g, Rfl: 1   famotidine (PEPCID) 20 MG tablet, Take 1 tablet (20 mg total) by mouth 2 (two) times daily., Disp: 90 tablet, Rfl: 3   folic acid ([name]) 1 MG tablet, Take 1 mg by mouth every morning., Disp: , Rfl:    guaifenesin (ROBITUSSIN) 100 MG/5ML syrup, Take 200 mg by mouth 3 (three) times daily as needed for  cough or congestion., Disp: , Rfl:    levocetirizine (XYZAL) 5 MG tablet, Take 1 tablet (5 mg total) by mouth every evening., Disp: 30 tablet, Rfl: 5   loperamide (IMODIUM) 2 MG capsule, Take 2 mg by mouth daily as needed for diarrhea or loose stools., Disp: , Rfl:    meloxicam (MOBIC) 7.5 MG tablet, Take 1 tablet (7.5 mg total) by mouth daily as needed for pain., Disp: 30 tablet, Rfl: 2   methotrexate (RHEUMATREX) 10 MG tablet, Take 1 tablet (10 mg total) by mouth once a week. Caution: Chemotherapy. Protect from light., Disp: 4 tablet, Rfl: 2   mirtazapine (REMERON) 15 MG tablet, Take 0.5 tablets (7.5 mg total) by mouth at bedtime., Disp: 30 tablet, Rfl: 5   Naphazoline HCl (CLEAR EYES OP), Place 1 drop into both eyes daily., Disp: , Rfl:    nitroGLYCERIN (NITROSTAT) 0.4 MG SL tablet, Place 1 tablet (0.4 mg total) under the tongue every 5 (five) minutes as needed. (Patient taking differently: Place 0.4 mg under the tongue every 5 (five) minutes as needed for chest pain.), Disp: 25 tablet, Rfl: 6   predniSONE (DELTASONE) 10 MG tablet, Take 6 tabs (24m) by mouth daily for 1 week, followed by 5 tabs daily for 1 week, then 4 tabs daily for 1 week, then 3 tabs daily, Pulmonary physician to titrate., Disp: 132 tablet, Rfl: 0   riTUXimab (RITUXAN IV),  Inject into the vein., Disp: , Rfl:    sulfamethoxazole-trimethoprim (BACTRIM DS) 800-160 MG tablet, Take 1 pill on Monday, Wednesday, and Friday of each week, Disp: 15 tablet, Rfl: 0   SYMBICORT 80-4.5 MCG/ACT inhaler, Inhale 2 puffs by mouth twice daily (Patient taking differently: Inhale 2 puffs into the lungs 2 (two) times daily as needed (shortness of breath/wheezing).), Disp: 11 g, Rfl: 0   vitamin C (ASCORBIC ACID) 500 MG tablet, Take 500 mg by mouth daily after supper., Disp: , Rfl:   Past Medical History: Past Medical History:  Diagnosis Date   Abnormal weight loss 12/04/2021   Acute interstitial pneumonitis ([place]) 12/04/2021   Aortic  atherosclerosis ([place]) 12/04/2021   Arthralgia of temporomandibular joint 12/04/2021   Arthritis    BMI 26.0-26.9,adult 10/22/2016   Chronic fatigue syndrome 12/04/2021   Dyslipidemia 03/10/2012   Family history of prostate cancer 12/04/2021   Glucose intolerance (impaired glucose tolerance)    Hyperlipidemia    Interstitial lung disease ([place]) 07/16/2019   Long-term current use of high risk medication other than anticoagulant 07/16/2019   Other long term (current) drug therapy 12/04/2021   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  Smoked a pip in college, not daily.    Labs: Review Flowsheet  More data exists      Latest Ref Rng & Units 05/14/2019 09/07/2019 03/13/2021 12/04/2021 01/19/2022  Labs for ITP Cardiac and Pulmonary Rehab  Cholestrol 0 - 200 mg/dL 246  108  155  119  -  LDL (calc) 0 - 99 mg/dL 193  65  100  72  -  HDL-C >39.00 mg/dL 39  31  45.00  34.40  -  Trlycerides 0.0 - 149.0 mg/dL 82  51  53.0  64.0  -  Hemoglobin A1c 4.0 - 5.6 % 6.2  5.9  - - -  Bicarbonate 20.0 - 28.0 mmol/L - - - - 28.9   O2 Saturation % - - - - 99.2     Capillary Blood Glucose: No results found for: "GLUCAP"   Pulmonary Assessment Scores:  Pulmonary Assessment Scores     Row Name 03/01/22 0954         ADL UCSD   ADL Phase Entry     SOB Score total 29       CAT Score   CAT Score 22       mMRC Score   mMRC Score 3             UCSD: Self-administered rating of dyspnea associated with activities of daily living (ADLs) 6-point scale (0 =  "not at all" to 5 = "maximal or unable to do because of breathlessness")  Scoring Scores range from 0 to 120.  Minimally important difference is 5 units  CAT: CAT can identify the health impairment of COPD patients and is better correlated with disease progression.  CAT has a scoring range of zero to 40. The CAT score is classified into four groups of low (less than 10), medium (10 -  20), high (21-30) and very high (31-40) based on the impact level of disease on health status. A CAT score over 10 suggests significant symptoms.  A worsening CAT score could be explained by an exacerbation, poor medication adherence, poor inhaler technique, or progression of COPD or comorbid conditions.  CAT MCID is 2 points  mMRC: mMRC (Modified Medical Research Council) Dyspnea Scale is used to assess the degree of baseline functional disability in patients of respiratory disease due to dyspnea. No minimal important difference is established. A decrease in score of 1 point or greater is considered a positive change.   Pulmonary Function Assessment:  Pulmonary Function Assessment - 03/01/22 0954       Breath   Bilateral Breath Sounds Clear;Decreased    Shortness of Breath Yes;Limiting activity             Exercise Target Goals: Exercise Program Goal: Individual exercise prescription set using results from initial 6 min walk test and THRR while considering  patient's activity barriers and safety.   Exercise Prescription Goal: Initial exercise prescription builds to 30-45 minutes a day of aerobic activity, 2-3 days per week.  Home exercise guidelines will be given to patient during program as part of exercise prescription that the participant will acknowledge.  Activity Barriers & Risk Stratification:  Activity Barriers & Cardiac Risk Stratification - 03/01/22 0955       Activity Barriers & Cardiac Risk Stratification   Activity Barriers Arthritis;Deconditioning;Muscular Weakness;Shortness of Breath              6 Minute Walk:  6 Minute Walk     Row Name 03/01/22 1054         6 Minute Walk   Phase Initial     Distance 1252 feet     Walk Time 6 minutes     # of Rest Breaks 0     MPH 2.37     METS 3.72     RPE 12     Perceived Dyspnea  2     VO2 Peak 13.02     Symptoms No     Resting HR 110 bpm     Resting BP 98/58     Resting Oxygen Saturation  97 %     Exercise Oxygen Saturation  during 6 min walk 90 %     Max Ex. HR 122 bpm     Max Ex. BP 132/60     2 Minute Post BP 100/50       Interval HR   1 Minute HR 117     2 Minute HR 119     3 Minute HR 120     4 Minute HR 120     5 Minute HR 119     6 Minute HR 122     2 Minute Post HR 101     Interval Heart Rate? Yes       Interval Oxygen   Interval Oxygen? Yes     Baseline Oxygen Saturation % 97 %     1 Minute Oxygen Saturation % 97 %     1 Minute Liters of Oxygen 2 L     2 Minute Oxygen Saturation % 96 %     2 Minute Liters of Oxygen 2 L     3 Minute Oxygen Saturation % 97 %     3 Minute Liters of Oxygen 2 L     4 Minute Oxygen Saturation % 93 %     4  Minute Liters of Oxygen 2 L     5 Minute Oxygen Saturation % 90 %     5 Minute Liters of Oxygen 2 L     6 Minute Oxygen Saturation % 93 %     6 Minute Liters of Oxygen 2 L     2 Minute Post Oxygen Saturation % 93 %     2 Minute Post Liters of Oxygen 2 L              Oxygen Initial Assessment:  Oxygen Initial Assessment - 03/06/22 0856       Home Oxygen   Home Oxygen Device Portable Concentrator;Home Concentrator    Sleep Oxygen Prescription Continuous    Liters per minute 2    Home Exercise Oxygen Prescription Continuous    Liters per minute 2    Home Resting Oxygen Prescription Continuous    Liters per minute 2    Compliance with Home Oxygen Use Yes      Initial 6 min Walk   Oxygen Used Continuous    Liters per minute 2      Program Oxygen Prescription   Program Oxygen Prescription Continuous    Liters per minute 2       Intervention   Short Term Goals To learn and exhibit compliance with exercise, home and travel O2 prescription;To learn and understand importance of monitoring SPO2 with pulse oximeter and demonstrate accurate use of the pulse oximeter.;To learn and understand importance of maintaining oxygen saturations>88%;To learn and demonstrate proper pursed lip breathing techniques or other breathing techniques. ;To learn and demonstrate proper use of respiratory medications    Long  Term Goals Exhibits compliance with exercise, home  and travel O2 prescription;Verbalizes importance of monitoring SPO2 with pulse oximeter and return demonstration;Maintenance of O2 saturations>88%;Exhibits proper breathing techniques, such as pursed lip breathing or other method taught during program session;Compliance with respiratory medication;Demonstrates proper use of MDI's             Oxygen Re-Evaluation:  Oxygen Re-Evaluation     Row Name 03/06/22 0856             Program Oxygen Prescription   Comments Pt has not began exercising in class yet         Goals/Expected Outcomes   Goals/Expected Outcomes Compliance and understanding of oxygen saturation monitoring and breathing techniques to decreasae shortness of breath.                Oxygen Discharge (Final Oxygen Re-Evaluation):  Oxygen Re-Evaluation - 03/06/22 0856       Program Oxygen Prescription   Comments Pt has not began exercising in class yet      Goals/Expected Outcomes   Goals/Expected Outcomes Compliance and understanding of oxygen saturation monitoring and breathing techniques to decreasae shortness of breath.             Initial Exercise Prescription:  Initial Exercise Prescription - 03/01/22 1100       Date of Initial Exercise RX and Referring Provider   Date 03/01/22    Referring Provider [name]   Expected Discharge Date 05/09/22      Oxygen   Oxygen Continuous    Liters 2    Maintain Oxygen Saturation 88%  or higher      Recumbant Bike   Level 1    Watts 24    Minutes 15      NuStep   Level 1    SPM 60  Minutes 15      Prescription Details   Frequency (times per week) 2    Duration Progress to 30 minutes of continuous aerobic without signs/symptoms of physical distress      Intensity   THRR 40-80% of Max Heartrate 61-122    Ratings of Perceived Exertion 11-13    Perceived Dyspnea 0-4      Progression   Progression Continue to progress workloads to maintain intensity without signs/symptoms of physical distress.      Resistance Training   Training Prescription Yes    Weight red bands    Reps 10-15             Perform Capillary Blood Glucose checks as needed.  Exercise Prescription Changes:   Exercise Prescription Changes     Row Name 03/12/22 1500             Response to Exercise   Blood Pressure (Admit) 98/60       Blood Pressure (Exercise) 110/60       Blood Pressure (Exit) 100/54       Heart Rate (Admit) 128 bpm       Heart Rate (Exercise) 140 bpm       Heart Rate (Exit) 125 bpm       Oxygen Saturation (Admit) 97 %       Oxygen Saturation (Exercise) 93 %       Oxygen Saturation (Exit) 96 %       Rating of Perceived Exertion (Exercise) 13       Perceived Dyspnea (Exercise) 1       Duration Continue with 30 min of aerobic exercise without signs/symptoms of physical distress.       Intensity THRR unchanged         Progression   Progression Continue to progress workloads to maintain intensity without signs/symptoms of physical distress.         Resistance Training   Training Prescription Yes       Weight red bands       Reps 10-15       Time 10 Minutes         Oxygen   Oxygen Continuous       Liters 2         Recumbant Bike   Level 1       Minutes 15       METs 1.9         NuStep   Level 1       SPM 60       Minutes 15       METs 1.2         Oxygen   Maintain Oxygen Saturation 88% or higher                Exercise  Comments:   Exercise Comments     Row Name 03/07/22 1506           Exercise Comments Pt completed first day of exerccise. Pt exercised on the recumbent bike for 15 min at level 1, METs 2.0. Then exercised on nustep for 15 min at level 1, METs 1.9. He needed rest stops intermittently due to fatigue/SOB and increased HR. Tolerated exercise fair. Performed warm up and cool down standing and sit to stand in place of squats. Discussed with pt METs with reception.                Exercise Goals and Review:   Exercise Goals  [place] Name 03/01/22 0955 03/06/22 0850           Exercise Goals   Increase Physical Activity Yes Yes      Intervention Provide advice, education, support and counseling about physical activity/exercise needs.;Develop an individualized exercise prescription for aerobic and resistive training based on initial evaluation findings, risk stratification, comorbidities and participant's personal goals. Provide advice, education, support and counseling about physical activity/exercise needs.;Develop an individualized exercise prescription for aerobic and resistive training based on initial evaluation findings, risk stratification, comorbidities and participant's personal goals.      Expected Outcomes Short Term: Attend rehab on a regular basis to increase amount of physical activity.;Long Term: Add in home exercise to make exercise part of routine and to increase amount of physical activity.;Long Term: Exercising regularly at least 3-5 days a week. Short Term: Attend rehab on a regular basis to increase amount of physical activity.;Long Term: Add in home exercise to make exercise part of routine and to increase amount of physical activity.;Long Term: Exercising regularly at least 3-5 days a week.      Increase Strength and Stamina Yes Yes      Intervention Provide advice, education, support and counseling about physical activity/exercise needs.;Develop an individualized exercise  prescription for aerobic and resistive training based on initial evaluation findings, risk stratification, comorbidities and participant's personal goals. Provide advice, education, support and counseling about physical activity/exercise needs.;Develop an individualized exercise prescription for aerobic and resistive training based on initial evaluation findings, risk stratification, comorbidities and participant's personal goals.      Expected Outcomes Short Term: Increase workloads from initial exercise prescription for resistance, speed, and METs.;Short Term: Perform resistance training exercises routinely during rehab and add in resistance training at home;Long Term: Improve cardiorespiratory fitness, muscular endurance and strength as measured by increased METs and functional capacity (6MWT) Short Term: Increase workloads from initial exercise prescription for resistance, speed, and METs.;Short Term: Perform resistance training exercises routinely during rehab and add in resistance training at home;Long Term: Improve cardiorespiratory fitness, muscular endurance and strength as measured by increased METs and functional capacity (6MWT)      Able to understand and use rate of perceived exertion (RPE) scale Yes Yes      Intervention Provide education and explanation on how to use RPE scale Provide education and explanation on how to use RPE scale      Expected Outcomes Short Term: Able to use RPE daily in rehab to express subjective intensity level;Long Term:  Able to use RPE to guide intensity level when exercising independently Short Term: Able to use RPE daily in rehab to express subjective intensity level;Long Term:  Able to use RPE to guide intensity level when exercising independently      Able to understand and use Dyspnea scale Yes Yes      Intervention Provide education and explanation on how to use Dyspnea scale Provide education and explanation on how to use Dyspnea scale      Expected Outcomes  Short Term: Able to use Dyspnea scale daily in rehab to express subjective sense of shortness of breath during exertion;Long Term: Able to use Dyspnea scale to guide intensity level when exercising independently Short Term: Able to use Dyspnea scale daily in rehab to express subjective sense of shortness of breath during exertion;Long Term: Able to use Dyspnea scale to guide intensity level when exercising independently      Knowledge and understanding of Target Heart Rate Range (THRR) Yes Yes      Intervention  Provide education and explanation of THRR including how the numbers were predicted and where they are located for reference Provide education and explanation of THRR including how the numbers were predicted and where they are located for reference      Expected Outcomes Short Term: Able to state/look up THRR;Long Term: Able to use THRR to govern intensity when exercising independently;Short Term: Able to use daily as guideline for intensity in rehab Short Term: Able to state/look up THRR;Long Term: Able to use THRR to govern intensity when exercising independently;Short Term: Able to use daily as guideline for intensity in rehab      Understanding of Exercise Prescription Yes Yes      Intervention Provide education, explanation, and written materials on patient's individual exercise prescription Provide education, explanation, and written materials on patient's individual exercise prescription      Expected Outcomes Short Term: Able to explain program exercise prescription;Long Term: Able to explain home exercise prescription to exercise independently Short Term: Able to explain program exercise prescription;Long Term: Able to explain home exercise prescription to exercise independently               Exercise Goals Re-Evaluation :  Exercise Goals Re-Evaluation     Row Name 03/06/22 0851             Exercise Goal Re-Evaluation   Exercise Goals Review Increase Physical Activity;Increase  Strength and Stamina;Able to understand and use rate of perceived exertion (RPE) scale;Able to understand and use Dyspnea scale;Knowledge and understanding of Target Heart Rate Range (THRR);Understanding of Exercise Prescription       Comments Pt is scheduled to begin exercise this week. Will monitor and progress as able.       Expected Outcomes Through exercise at rehab and home, the patient will decrease shortness of breath with daily activities and feel confident in carrying out an exercise regimen at home.                Discharge Exercise Prescription (Final Exercise Prescription Changes):  Exercise Prescription Changes - 03/12/22 1500       Response to Exercise   Blood Pressure (Admit) 98/60    Blood Pressure (Exercise) 110/60    Blood Pressure (Exit) 100/54    Heart Rate (Admit) 128 bpm    Heart Rate (Exercise) 140 bpm    Heart Rate (Exit) 125 bpm    Oxygen Saturation (Admit) 97 %    Oxygen Saturation (Exercise) 93 %    Oxygen Saturation (Exit) 96 %    Rating of Perceived Exertion (Exercise) 13    Perceived Dyspnea (Exercise) 1    Duration Continue with 30 min of aerobic exercise without signs/symptoms of physical distress.    Intensity THRR unchanged      Progression   Progression Continue to progress workloads to maintain intensity without signs/symptoms of physical distress.      Resistance Training   Training Prescription Yes    Weight red bands    Reps 10-15    Time 10 Minutes      Oxygen   Oxygen Continuous    Liters 2      Recumbant Bike   Level 1    Minutes 15    METs 1.9      NuStep   Level 1    SPM 60    Minutes 15    METs 1.2      Oxygen   Maintain Oxygen Saturation 88% or higher  Nutrition:  Target Goals: Understanding of nutrition guidelines, daily intake of sodium <1570m, cholesterol <2057m calories 30% from fat and 7% or less from saturated fats, daily to have 5 or more servings of fruits and  vegetables.  Biometrics:  Pre Biometrics - 03/01/22 0941       Pre Biometrics   Grip Strength 20 kg              Nutrition Therapy Plan and Nutrition Goals:  Nutrition Therapy & Goals - 03/07/22 1427       Nutrition Therapy   Diet Heart Healthy Diet      Personal Nutrition Goals   Nutrition Goal Patient to identify strategies for weight gain of 0.5-2.0# per week of weight gain    Personal Goal #2 Patient to choose a daily variety of fruits, vegetables, whole grains, lean protein/plant protein, and dairy as part of heart healthy lifestyle    Comments Patient has previously met with RD at [facility] 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 MiJeffres drinking Boost (530kcals, 22g protein) for breakfast and smoothie in the afternoon with protein powder and peanut butter. He is taking remeron and continues three regular meals daily + supplements. Per wife, he trying to gain up to 112-113# for transplant. Per past RD notes, patient was weighing 123# in January 2023.      Intervention Plan   Intervention Prescribe, educate and counsel regarding individualized specific dietary modifications aiming towards targeted core components such as weight, hypertension, lipid management, diabetes, heart failure and other comorbidities.;Nutrition handout(s) given to patient.    Expected Outcomes Short Term Goal: Understand basic principles of dietary content, such as calories, fat, sodium, cholesterol and nutrients.;Long Term Goal: Adherence to prescribed nutrition plan.             Nutrition Assessments:  MEDIFICTS Score Key: ?70 Need to make dietary changes  40-70 Heart Healthy Diet ? 40 Therapeutic Level Cholesterol Diet   Picture Your Plate Scores: <4<68nhealthy dietary pattern with much room for improvement. 41-50 Dietary pattern unlikely to meet recommendations for good health and room for improvement. 51-60 More healthful dietary pattern, with  some room for improvement.  >60 Healthy dietary pattern, although there may be some specific behaviors that could be improved.    Nutrition Goals Re-Evaluation:  Nutrition Goals Re-Evaluation     [place] 03/07/22 1427             Goals   Current Weight 101 lb 3.1 oz (45.9 kg)       Comment lipid panel WNL, A1c 6.0; patient is up 2.86# since his orientation on 8/25 (start weight 44.6kg).       Expected Outcome Goals in action. 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 MiFounts drinking Boost (530kcals, 22g protein) for breakfast and smoothie in the afternoon with protein powder and peanut butter. He is taking remeron and continues three regular meals daily + supplements. Per wife, he trying to gain up to 112-113# for transplant. Per past RD notes, patient was weighing 123# in January 2023.                Nutrition Goals Discharge (Final Nutrition Goals Re-Evaluation):  Nutrition Goals Re-Evaluation - 03/07/22 1427       Goals   Current Weight 101 lb 3.1 oz (45.9 kg)    Comment lipid panel WNL, A1c 6.0;  patient is up 2.86# since his orientation on 8/25 (start weight 44.6kg).    Expected Outcome Goals in action. 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 Temitope is drinking Boost (530kcals, 22g protein) for breakfast and smoothie in the afternoon with protein powder and peanut butter. He is taking remeron and continues three regular meals daily + supplements. Per wife, he trying to gain up to 112-113# for transplant. Per past RD notes, patient was weighing 123# in January 2023.             Psychosocial: Target Goals: Acknowledge presence or absence of significant depression and/or stress, maximize coping skills, provide positive support system. Participant is able to verbalize types and ability to use techniques  and skills needed for reducing stress and depression.  Initial Review & Psychosocial Screening:  Initial Psych Review & Screening - 03/01/22 0949       Initial Review   Current issues with None Identified      Family Dynamics   Good Support System? Yes    Comments wife and children, friends      Barriers   Psychosocial barriers to participate in program There are no identifiable barriers or psychosocial needs.      Screening Interventions   Interventions Encouraged to exercise             Quality of Life Scores:  Scores of 19 and below usually indicate a poorer quality of life in these areas.  A difference of  2-3 points is a clinically meaningful difference.  A difference of 2-3 points in the total score of the Quality of Life Index has been associated with significant improvement in overall quality of life, self-image, physical symptoms, and general health in studies assessing change in quality of life.  PHQ-9: Review Flowsheet  More data exists      03/01/2022 01/28/2022 12/04/2021 11/19/2021 06/06/2021  Depression screen PHQ 2/9  Decreased Interest 0 0 0 0 0  Down, Depressed, Hopeless 0 0 0 0 0  PHQ - 2 Score 0 0 0 0 0  Altered sleeping 0 - - - -  Tired, decreased energy 0 - - - -  Change in appetite 0 - - - -  Feeling bad or failure about yourself  0 - - - -  Trouble concentrating 0 - - - -  Moving slowly or fidgety/restless 0 - - - -  Suicidal thoughts 0 - - - -  PHQ-9 Score 0 - - - -   Interpretation of Total Score  Total Score Depression Severity:  1-4 = Minimal depression, 5-9 = Mild depression, 10-14 = Moderate depression, 15-19 = Moderately severe depression, 20-27 = Severe depression   Psychosocial Evaluation and Intervention:  Psychosocial Evaluation - 03/01/22 0949       Psychosocial Evaluation & Interventions   Interventions Encouraged to exercise with the program and follow exercise prescription    Expected Outcomes For Koston to participate in  Pulmonary Rehab    Continue Psychosocial Services  Follow up required by staff             Psychosocial Re-Evaluation:  Psychosocial Re-Evaluation     [place] Name 03/12/22 1002             Psychosocial Re-Evaluation   Current issues with None Identified       Comments Shyhiem has attended only one session thus far. He had no issues. We will  continue to monitor for any psychosocial barriers.       Expected Outcomes For him to particiapte in the PR program without any psychosocial concerns.       Interventions Encouraged to attend Pulmonary Rehabilitation for the exercise       Continue Psychosocial Services  No Follow up required                Psychosocial Discharge (Final Psychosocial Re-Evaluation):  Psychosocial Re-Evaluation - 03/12/22 1002       Psychosocial Re-Evaluation   Current issues with None Identified    Comments Phi has attended only one session thus far. He had no issues. We will continue to monitor for any psychosocial barriers.    Expected Outcomes For him to particiapte in the PR program without any psychosocial concerns.    Interventions Encouraged to attend Pulmonary Rehabilitation for the exercise    Continue Psychosocial Services  No Follow up required             Education: Education Goals: Education classes will be provided on a weekly basis, covering required topics. Participant will state understanding/return demonstration of topics presented.  Learning Barriers/Preferences:  Learning Barriers/Preferences - 03/01/22 0950       Learning Barriers/Preferences   Learning Barriers Sight   wears glasses   Learning Preferences None             Education Topics: Introduction to Pulmonary Rehab Group instruction provided by PowerPoint, verbal discussion, and written material to support subject matter. Instructor reviews what Pulmonary Rehab is, the purpose of the program, and how patients are referred.     Know Your  Numbers Group instruction that is supported by a PowerPoint presentation. Instructor discusses importance of knowing and understanding resting, exercise, and post-exercise oxygen saturation, heart rate, and blood pressure. Oxygen saturation, heart rate, blood pressure, rating of perceived exertion, and dyspnea are reviewed along with a normal range for these values.    Exercise for the Pulmonary Patient Group instruction that is supported by a PowerPoint presentation. Instructor discusses benefits of exercise, core components of exercise, frequency, duration, and intensity of an exercise routine, importance of utilizing pulse oximetry during exercise, safety while exercising, and options of places to exercise outside of rehab.       MET Level  Group instruction provided by PowerPoint, verbal discussion, and written material to support subject matter. Instructor reviews what METs are and how to increase METs.    Pulmonary Medications Verbally interactive group education provided by instructor with focus on inhaled medications and proper administration.   Anatomy and Physiology of the Respiratory System Group instruction provided by PowerPoint, verbal discussion, and written material to support subject matter. Instructor reviews respiratory cycle and anatomical components of the respiratory system and their functions. Instructor also reviews differences in obstructive and restrictive respiratory diseases with examples of each.  Flowsheet Row PULMONARY REHAB OTHER RESPIRATORY from 03/07/2022 in [place]  Date 03/07/22  Educator Donnetta Simpers  Instruction Review Code 1- Verbalizes Understanding       Oxygen Safety Group instruction provided by PowerPoint, verbal discussion, and written material to support subject matter. There is an overview of "What is Oxygen" and "Why do we need it".  Instructor also reviews how to create a safe environment for oxygen use, the  importance of using oxygen as prescribed, and the risks of noncompliance. There is a brief discussion on traveling with oxygen and resources the patient may utilize.   Oxygen Use  Group instruction provided by PowerPoint, verbal discussion, and written material to discuss how supplemental oxygen is prescribed and different types of oxygen supply systems. Resources for more information are provided.    Breathing Techniques Group instruction that is supported by demonstration and informational handouts. Instructor discusses the benefits of pursed lip and diaphragmatic breathing and detailed demonstration on how to perform both.     Risk Factor Reduction Group instruction that is supported by a PowerPoint presentation. Instructor discusses the definition of a risk factor, different risk factors for pulmonary disease, and how the heart and lungs work together.   MD Day A group question and answer session with a medical doctor that allows participants to ask questions that relate to their pulmonary disease state.   Nutrition for the Pulmonary Patient Group instruction provided by PowerPoint slides, verbal discussion, and written materials to support subject matter. The instructor gives an explanation and review of healthy diet recommendations, which includes a discussion on weight management, recommendations for fruit and vegetable consumption, as well as protein, fluid, caffeine, fiber, sodium, sugar, and alcohol. Tips for eating when patients are short of breath are discussed.    Other Education Group or individual verbal, written, or video instructions that support the educational goals of the pulmonary rehab program.    Knowledge Questionnaire Score:  Knowledge Questionnaire Score - 03/01/22 0959       Knowledge Questionnaire Score   Pre Score 15/18             Core Components/Risk Factors/Patient Goals at Admission:  Personal Goals and Risk Factors at Admission - 03/01/22 0951        Core Components/Risk Factors/Patient Goals on Admission    Weight Management Weight Gain    Improve shortness of breath with ADL's Yes    Intervention Provide education, individualized exercise plan and daily activity instruction to help decrease symptoms of SOB with activities of daily living.    Expected Outcomes Short Term: Improve cardiorespiratory fitness to achieve a reduction of symptoms when performing ADLs;Long Term: Be able to perform more ADLs without symptoms or delay the onset of symptoms             Core Components/Risk Factors/Patient Goals Review:   Goals and Risk Factor Review     Row Name 03/12/22 1546             Core Components/Risk Factors/Patient Goals Review   Personal Goals Review Other;Improve shortness of breath with ADL's;Develop more efficient breathing techniques such as purse lipped breathing and diaphragmatic breathing and practicing self-pacing with activity.;Increase knowledge of respiratory medications and ability to use respiratory devices properly.       Review Dannon is just getting started in the PR program and has attended 1 session. We will continue to his core components.       Expected Outcomes See admission goals                Core Components/Risk Factors/Patient Goals at Discharge (Final Review):   Goals and Risk Factor Review - 03/12/22 1546       Core Components/Risk Factors/Patient Goals Review   Personal Goals Review Other;Improve shortness of breath with ADL's;Develop more efficient breathing techniques such as purse lipped breathing and diaphragmatic breathing and practicing self-pacing with activity.;Increase knowledge of respiratory medications and ability to use respiratory devices properly.    Review Demyan is just getting started in the PR program and has attended 1 session. We will continue to his core components.  Expected Outcomes See admission goals             ITP Comments: Pt is making expected  progress toward Pulmonary Rehab goals after completing 2 sessions. Recommend continued exercise, life style modification, education, and utilization of breathing techniques to increase stamina and strength, while also decreasing shortness of breath with exertion.  Dr. Rodman Pickle is Medical Director for Pulmonary Rehab at [facility].

## 2022-03-14 ENCOUNTER — Encounter (HOSPITAL_COMMUNITY)
Admission: RE | Admit: 2022-03-14 | Discharge: 2022-03-14 | Disposition: A | Discharge status: Home / Self Care | Payer: Medicare Other | Source: Ambulatory Visit | Attending: Pulmonary Disease | Admitting: Pulmonary Disease

## 2022-03-14 DIAGNOSIS — R0902 Hypoxemia: Secondary | ICD-10-CM | POA: Diagnosis not present

## 2022-03-14 NOTE — Progress Notes (Signed)
Daily Session Note  Patient Details  Name: Gopal Malter MRN: [id number] Date of Birth: 11-Nov-1950 Referring Provider:   [name] Pulmonary Rehab Walk Test from 03/01/2022 in [place]  Referring Provider Maceo Pro  [Ell[place]]       Encounter Date: 03/14/2022  Check In:   Capillary Blood Glucose: No results found for this or any previous visit (from the past 24 hour(s)).    Social History   Tobacco Use  Smoking Status Former   Types: Pipe   Quit date: 10/06/1988   Years since quitting: 33.4   Passive exposure: Never  Smokeless Tobacco Never  Tobacco Comments   parent smoked in home as a child.  Smoked a pip in college, not daily.    Goals Met:  Independence with exercise equipment Exercise tolerated well No report of concerns or symptoms today Strength training completed today  Goals Unmet:  Not Applicable  Comments: Service time is from 1320 to 1445.    Dr. Rodman Pickle is Medical Director for Pulmonary Rehab at [facility].

## 2022-03-18 ENCOUNTER — Telehealth: Payer: Self-pay | Admitting: Pharmacy Technician

## 2022-03-18 ENCOUNTER — Other Ambulatory Visit (HOSPITAL_COMMUNITY): Payer: Self-pay

## 2022-03-18 NOTE — Telephone Encounter (Signed)
Patient Advocate Encounter  Prior Authorization for Trexall 51m (methotrexate) has been approved.    Effective: 03-18-2022 to 07-07-2038  Test claim returns a $0.00 co-pay

## 2022-03-18 NOTE — Telephone Encounter (Addendum)
Patient Advocate Encounter   Received notification that prior authorization for Trexall 13m (methotrexate) is required/requested.   [id number] Case ID: [id number] is pending

## 2022-03-19 ENCOUNTER — Encounter (HOSPITAL_COMMUNITY): Payer: Medicare Other

## 2022-03-19 ENCOUNTER — Encounter (HOSPITAL_COMMUNITY)
Admission: RE | Admit: 2022-03-19 | Discharge: 2022-03-19 | Disposition: A | Discharge status: Home / Self Care | Payer: Medicare Other | Source: Ambulatory Visit | Attending: Pulmonary Disease | Admitting: Pulmonary Disease

## 2022-03-19 DIAGNOSIS — R0902 Hypoxemia: Secondary | ICD-10-CM

## 2022-03-19 NOTE — Progress Notes (Signed)
Daily Session Note  Patient Details  Name: Earl Thomas MRN: [id number] Date of Birth: 1951-05-23 Referring Provider:   [name] Pulmonary Rehab Walk Test from 03/01/2022 in [place]  Referring Provider [name]       Encounter Date: 03/19/2022  Check In:  Session Check In - 03/19/22 1502       Check-In   Supervising physician immediately available to respond to emergencies [facility] immediately available    Physician(s) Dr. Earney Hamburg    Location [facility]    Staff Present Rosebud Poles, RN, Quentin Ore, MS, ACSM-CEP, Exercise Physiologist;Randi Yevonne Pax, ACSM-CEP, Exercise Physiologist;Carlette Wilber Oliphant, RN, BSN    Virtual Visit No    Medication changes reported     No    Fall or balance concerns reported    No    Tobacco Cessation No Change    Warm-up and Cool-down Performed as group-led instruction    Resistance Training Performed Yes    VAD Patient? No    PAD/SET Patient? No      Pain Assessment   Currently in Pain? No/denies    Multiple Pain Sites No             Capillary Blood Glucose: No results found for this or any previous visit (from the past 24 hour(s)).    Social History   Tobacco Use  Smoking Status Former   Types: Pipe   Quit date: 10/06/1988   Years since quitting: 33.4   Passive exposure: Never  Smokeless Tobacco Never  Tobacco Comments   parent smoked in home as a child.  Smoked a pip in college, not daily.    Goals Met:  Proper associated with RPD/PD & O2 Sat Exercise tolerated well No report of concerns or symptoms today Strength training completed today  Goals Unmet:  Not Applicable  Comments: Service time is from 1321 to 1435.    Dr. Rodman Pickle is Medical Director for Pulmonary Rehab at [facility].

## 2022-03-21 ENCOUNTER — Encounter (HOSPITAL_COMMUNITY): Payer: Medicare Other

## 2022-03-21 ENCOUNTER — Encounter (HOSPITAL_COMMUNITY)
Admission: RE | Admit: 2022-03-21 | Discharge: 2022-03-21 | Disposition: A | Discharge status: Home / Self Care | Payer: Medicare Other | Source: Ambulatory Visit | Attending: Pulmonary Disease | Admitting: Pulmonary Disease

## 2022-03-21 DIAGNOSIS — R0902 Hypoxemia: Secondary | ICD-10-CM | POA: Diagnosis not present

## 2022-03-21 NOTE — Progress Notes (Signed)
Daily Session Note  Patient Details  Name: Earl Thomas MRN: [id number] Date of Birth: May 26, 1951 Referring Provider:   [name] Pulmonary Rehab Walk Test from 03/01/2022 in [place]  Referring Provider [name]       Encounter Date: 03/21/2022  Check In:  Session Check In - 03/21/22 1416       Check-In   Supervising physician immediately available to respond to emergencies [facility] - Physician supervision    Physician(s) Dr. Earney Hamburg    Location [facility]    Staff Present Elmon Else, MS, ACSM-CEP, Exercise Physiologist;Randi Yevonne Pax, ACSM-CEP, Exercise Physiologist;Lisa Assunta Curtis, MS, Exercise Physiologist    Virtual Visit No    Medication changes reported     No    Fall or balance concerns reported    No    Tobacco Cessation No Change    Warm-up and Cool-down Performed as group-led instruction    Resistance Training Performed Yes    VAD Patient? No    PAD/SET Patient? No      Pain Assessment   Currently in Pain? No/denies    Multiple Pain Sites No             Capillary Blood Glucose: No results found for this or any previous visit (from the past 24 hour(s)).    Social History   Tobacco Use  Smoking Status Former   Types: Pipe   Quit date: 10/06/1988   Years since quitting: 33.4   Passive exposure: Never  Smokeless Tobacco Never  Tobacco Comments   parent smoked in home as a child.  Smoked a pip in college, not daily.    Goals Met:  Proper associated with RPD/PD & O2 Sat Exercise tolerated well No report of concerns or symptoms today Strength training completed today  Goals Unmet:  Not Applicable  Comments: Service time is from 1314 to 1447.    Dr. Rodman Pickle is Medical Director for Pulmonary Rehab at [facility].

## 2022-03-22 ENCOUNTER — Other Ambulatory Visit (HOSPITAL_COMMUNITY): Payer: Self-pay

## 2022-03-26 ENCOUNTER — Encounter (HOSPITAL_COMMUNITY): Payer: Medicare Other

## 2022-03-26 ENCOUNTER — Encounter (HOSPITAL_COMMUNITY)
Admission: RE | Admit: 2022-03-26 | Discharge: 2022-03-26 | Disposition: A | Discharge status: Home / Self Care | Payer: Medicare Other | Source: Ambulatory Visit | Attending: Pulmonary Disease | Admitting: Pulmonary Disease

## 2022-03-26 ENCOUNTER — Telehealth: Payer: Self-pay | Admitting: Internal Medicine

## 2022-03-26 VITALS — Wt 103.8 lb

## 2022-03-26 DIAGNOSIS — Z79899 Other long term (current) drug therapy: Secondary | ICD-10-CM | POA: Diagnosis not present

## 2022-03-26 DIAGNOSIS — R5383 Other fatigue: Secondary | ICD-10-CM | POA: Diagnosis not present

## 2022-03-26 DIAGNOSIS — Z111 Encounter for screening for respiratory tuberculosis: Secondary | ICD-10-CM | POA: Diagnosis not present

## 2022-03-26 DIAGNOSIS — R0902 Hypoxemia: Secondary | ICD-10-CM

## 2022-03-26 DIAGNOSIS — M0579 Rheumatoid arthritis with rheumatoid factor of multiple sites without organ or systems involvement: Secondary | ICD-10-CM | POA: Diagnosis not present

## 2022-03-26 NOTE — Telephone Encounter (Signed)
Patient's daughter called to ask if she could come by the office today to pick up the paperwork for the patient's handicap decal.  She stated that the doctor said she was going to mail it a few weeks ago and they still have not gotten it yet.  Please advise and call to confirm that she can pick it up.  CB# [phone]

## 2022-03-26 NOTE — Telephone Encounter (Signed)
Called and spoke with patient's daughter Earl Thomas. She stated that the patient has not received the handicap placard that was mailed earlier this month. She is in the area today and wanted to know if Dr. Shearon Stalls would be willing to sign another one so that she can come by the office today to pick it up.   Dr. Shearon Stalls, would you be willing to sign another form? Thanks!

## 2022-03-26 NOTE — Telephone Encounter (Signed)
Spoke with Dr. Shearon Stalls and she was ok with signing the application. I will make a copy of the form and sent it to scan. Spoke with patient's daughter, she will stop by the office today.   Nothing further needed at time of call.

## 2022-03-26 NOTE — Progress Notes (Signed)
Daily Session Note  Patient Details  Name: Earl Thomas MRN: [id number] Date of Birth: 16-Nov-1950 Referring Provider:   [name] Pulmonary Rehab Walk Test from 03/01/2022 in [place]  Referring Provider [name]       Encounter Date: 03/26/2022  Check In:  Session Check In - 03/26/22 1549       Check-In   Supervising physician immediately available to respond to emergencies [facility] - Physician supervision    Physician(s) Tamala Julian    [facility]    Staff Present Elmon Else, MS, ACSM-CEP, Exercise Physiologist;Randi Olen Cordial BS, ACSM-CEP, Exercise Physiologist;David Makemson, MS, ACSM-CEP, CCRP, Exercise Physiologist;Carlette Wilber Oliphant, Therapist, sports, BSN;Ramon Dredge, RN, MHA    Virtual Visit No    Medication changes reported     No    Fall or balance concerns reported    No    Tobacco Cessation No Change    Warm-up and Cool-down Performed as group-led instruction    Resistance Training Performed Yes    VAD Patient? No    PAD/SET Patient? No      Pain Assessment   Currently in Pain? No/denies    Multiple Pain Sites No             Capillary Blood Glucose: No results found for this or any previous visit (from the past 24 hour(s)).   Exercise Prescription Changes - 03/26/22 1500       Response to Exercise   Blood Pressure (Admit) 102/58    Blood Pressure (Exercise) 116/70    Blood Pressure (Exit) 104/60    Heart Rate (Admit) 115 bpm    Heart Rate (Exercise) 115 bpm    Heart Rate (Exit) 115 bpm    Oxygen Saturation (Admit) 98 %    Oxygen Saturation (Exercise) 96 %    Oxygen Saturation (Exit) 97 %    Rating of Perceived Exertion (Exercise) 11    Perceived Dyspnea (Exercise) 1    Duration Continue with 30 min of aerobic exercise without signs/symptoms of physical distress.    Intensity THRR unchanged      Progression   Progression Continue to progress workloads to maintain intensity without  signs/symptoms of physical distress.      Resistance Training   Training Prescription Yes    Weight red bands    Reps 10-15    Time 10 Minutes      Oxygen   Oxygen Continuous    Liters 2      NuStep   Level 2    SPM 60    Minutes 30    METs 1.4      Oxygen   Maintain Oxygen Saturation 88% or higher             Social History   Tobacco Use  Smoking Status Former   Types: Pipe   Quit date: 10/06/1988   Years since quitting: 33.4   Passive exposure: Never  Smokeless Tobacco Never  Tobacco Comments   parent smoked in home as a child.  Smoked a pip in college, not daily.    Goals Met:  Proper associated with RPD/PD & O2 Sat Exercise tolerated well No report of concerns or symptoms today Strength training completed today  Goals Unmet:  Not Applicable  Comments: Service time is from 1311 to 1446.    Dr. Rodman Pickle is Medical Director for Pulmonary Rehab at [facility].

## 2022-03-28 ENCOUNTER — Encounter (HOSPITAL_COMMUNITY): Payer: Medicare Other

## 2022-03-28 ENCOUNTER — Encounter (HOSPITAL_COMMUNITY)
Admission: RE | Admit: 2022-03-28 | Discharge: 2022-03-28 | Disposition: A | Discharge status: Home / Self Care | Payer: Medicare Other | Source: Ambulatory Visit | Attending: Pulmonary Disease | Admitting: Pulmonary Disease

## 2022-03-28 DIAGNOSIS — R0902 Hypoxemia: Secondary | ICD-10-CM

## 2022-03-28 NOTE — Progress Notes (Signed)
Daily Session Note  Patient Details  Name: Earl Thomas MRN: [id number] Date of Birth: [birth year] Referring Provider:   [name] Pulmonary Rehab Walk Test from 03/01/2022 in [place]  Referring Provider [name]       Encounter Date: 03/28/2022  Check In:  Session Check In - 03/28/22 1423       Check-In   Supervising physician immediately available to respond to emergencies [facility] - Physician supervision    Physician(s) Tamala Julian    [facility]    Staff Present Elmon Else, MS, ACSM-CEP, Exercise Physiologist;Randi Yevonne Pax, ACSM-CEP, Exercise Physiologist;Carlette Wilber Oliphant, RN, BSN;Jetta Walker BS, ACSM-CEP, Exercise Physiologist;Bailey Pearline Cables, MS, Exercise Physiologist    Virtual Visit No    Medication changes reported     No    Fall or balance concerns reported    No    Tobacco Cessation No Change    Warm-up and Cool-down Performed as group-led instruction    Resistance Training Performed Yes    VAD Patient? No    PAD/SET Patient? No      Pain Assessment   Currently in Pain? No/denies    Multiple Pain Sites No             Capillary Blood Glucose: No results found for this or any previous visit (from the past 24 hour(s)).    Social History   Tobacco Use  Smoking Status Former   Types: Pipe   Quit date: 10/06/1988   Years since quitting: 33.4   Passive exposure: Never  Smokeless Tobacco Never  Tobacco Comments   parent smoked in home as a child.  Smoked a pip in college, not daily.    Goals Met:  Proper associated with RPD/PD & O2 Sat Exercise tolerated well No report of concerns or symptoms today Strength training completed today  Goals Unmet:  Not Applicable  Comments: Service time is from 1310 to 1445.    Dr. Rodman Pickle is Medical Director for Pulmonary Rehab at [facility].

## 2022-04-02 ENCOUNTER — Encounter (HOSPITAL_COMMUNITY)
Admission: RE | Admit: 2022-04-02 | Discharge: 2022-04-02 | Disposition: A | Discharge status: Home / Self Care | Payer: Medicare Other | Source: Ambulatory Visit | Attending: Pulmonary Disease | Admitting: Pulmonary Disease

## 2022-04-02 ENCOUNTER — Encounter (HOSPITAL_COMMUNITY): Payer: Medicare Other

## 2022-04-02 DIAGNOSIS — R0902 Hypoxemia: Secondary | ICD-10-CM

## 2022-04-02 NOTE — Progress Notes (Signed)
Daily Session Note  Patient Details  Name: Earl Thomas MRN: [id number] Date of Birth: 11/12/1950 Referring Provider:   Flowsheet Row Pulmonary Rehab Walk Test from 03/01/2022 in [facility]  Referring Provider Fried  [Ell[place]]       Encounter Date: 04/02/2022  Check In:  Session Check In - 04/02/22 1438       Check-In   Supervising physician immediately available to respond to emergencies [facility] - Physician supervision    Physician(s) Dr. Pagie Clark    [facility]    Staff Present Lisa Hughes, RN; , MS, ACSM-CEP, Exercise Physiologist;Randi Reeve BS, ACSM-CEP, Exercise Physiologist;Annedrea Stackhouse, RN, MHA;Carlette Carlton, RN, BSN    Virtual Visit No    Medication changes reported     No    Fall or balance concerns reported    No    Tobacco Cessation No Change    Warm-up and Cool-down Performed as group-led instruction    Resistance Training Performed Yes    VAD Patient? No    PAD/SET Patient? No      Pain Assessment   Currently in Pain? No/denies    Multiple Pain Sites No             Capillary Blood Glucose: No results found for this or any previous visit (from the past 24 hour(s)).    Social History   Tobacco Use  Smoking Status Former   Types: Pipe   Quit date: 10/06/1988   Years since quitting: 33.5   Passive exposure: Never  Smokeless Tobacco Never  Tobacco Comments   parent smoked in home as a child.  Smoked a pip in college, not daily.    Goals Met:  Proper associated with RPD/PD & O2 Sat Exercise tolerated well No report of concerns or symptoms today Strength training completed today  Goals Unmet:  Not Applicable  Comments: Service time is from 1335 to 1445.    Dr. Jane Ellison is Medical Director for Pulmonary Rehab at [facility].  

## 2022-04-03 NOTE — Progress Notes (Signed)
Pulmonary Individual Treatment Plan  Patient Details  Name: Earl Thomas MRN: [id number] Date of Birth: [birth year] Referring Provider:   [name] Pulmonary Rehab Walk Test from 03/01/2022 in [place]  Referring Provider Maceo Pro  [[place]]       Initial Encounter Date:  Flowsheet Row Pulmonary Rehab Walk Test from 03/01/2022 in [place]  Date 03/01/22       Visit Diagnosis: Hypoxemia  Patient's Home Medications on Admission:   Current Outpatient Medications:    acetaminophen (TYLENOL) 500 MG tablet, Take 1,000 mg by mouth every 6 (six) hours as needed for headache (pain)., Disp: , Rfl:    albuterol (VENTOLIN HFA) 108 (90 Base) MCG/ACT inhaler, Inhale 2 puffs into the lungs every 6 (six) hours as needed for wheezing or shortness of breath., Disp: 18 g, Rfl: 5   aspirin EC 81 MG tablet, Take 81 mg by mouth every morning., Disp: , Rfl:    azelastine (ASTELIN) 0.1 % nasal spray, Place 2 sprays into both nostrils 2 (two) times daily. Use in each nostril as directed (Patient taking differently: Place 2 sprays into both nostrils 2 (two) times daily as needed for rhinitis or allergies (congestion). Use in each nostril as directed), Disp: 30 mL, Rfl: 12   Brimonidine Tartrate (LUMIFY) 0.025 % SOLN, Place 1 drop into both eyes every Sunday., Disp: , Rfl:    cetirizine (ZYRTEC) 10 MG tablet, Take 10 mg by mouth daily., Disp: , Rfl:    diclofenac sodium (VOLTAREN) 1 % GEL, Apply 2 g topically 4 (four) times daily. (Patient taking differently: Apply 2 g topically 4 (four) times daily as needed (pain).), Disp: 100 g, Rfl: 1   famotidine (PEPCID) 20 MG tablet, Take 1 tablet (20 mg total) by mouth 2 (two) times daily., Disp: 90 tablet, Rfl: 3   folic acid ([name]) 1 MG tablet, Take 1 mg by mouth every morning., Disp: , Rfl:    guaifenesin (ROBITUSSIN) 100 MG/5ML syrup, Take 200 mg by mouth 3 (three) times daily as needed for  cough or congestion., Disp: , Rfl:    levocetirizine (XYZAL) 5 MG tablet, Take 1 tablet (5 mg total) by mouth every evening., Disp: 30 tablet, Rfl: 5   loperamide (IMODIUM) 2 MG capsule, Take 2 mg by mouth daily as needed for diarrhea or loose stools., Disp: , Rfl:    meloxicam (MOBIC) 7.5 MG tablet, Take 1 tablet (7.5 mg total) by mouth daily as needed for pain., Disp: 30 tablet, Rfl: 2   methotrexate (RHEUMATREX) 10 MG tablet, Take 1 tablet (10 mg total) by mouth once a week. Caution: Chemotherapy. Protect from light., Disp: 4 tablet, Rfl: 2   mirtazapine (REMERON) 15 MG tablet, Take 0.5 tablets (7.5 mg total) by mouth at bedtime., Disp: 30 tablet, Rfl: 5   Naphazoline HCl (CLEAR EYES OP), Place 1 drop into both eyes daily., Disp: , Rfl:    nitroGLYCERIN (NITROSTAT) 0.4 MG SL tablet, Place 1 tablet (0.4 mg total) under the tongue every 5 (five) minutes as needed. (Patient taking differently: Place 0.4 mg under the tongue every 5 (five) minutes as needed for chest pain.), Disp: 25 tablet, Rfl: 6   predniSONE (DELTASONE) 10 MG tablet, Take 6 tabs (59m) by mouth daily for 1 week, followed by 5 tabs daily for 1 week, then 4 tabs daily for 1 week, then 3 tabs daily, Pulmonary physician to titrate., Disp: 132 tablet, Rfl: 0   riTUXimab (RITUXAN IV),  Inject into the vein., Disp: , Rfl:    sulfamethoxazole-trimethoprim (BACTRIM DS) 800-160 MG tablet, Take 1 pill on Monday, Wednesday, and Friday of each week, Disp: 15 tablet, Rfl: 0   SYMBICORT 80-4.5 MCG/ACT inhaler, Inhale 2 puffs by mouth twice daily (Patient taking differently: Inhale 2 puffs into the lungs 2 (two) times daily as needed (shortness of breath/wheezing).), Disp: 11 g, Rfl: 0   vitamin C (ASCORBIC ACID) 500 MG tablet, Take 500 mg by mouth daily after supper., Disp: , Rfl:   Past Medical History: Past Medical History:  Diagnosis Date   Abnormal weight loss 12/04/2021   Acute interstitial pneumonitis ([place]) 12/04/2021   Aortic  atherosclerosis ([place]) 12/04/2021   Arthralgia of temporomandibular joint 12/04/2021   Arthritis    BMI 26.0-26.9,adult 10/22/2016   Chronic fatigue syndrome 12/04/2021   Dyslipidemia 03/10/2012   Family history of prostate cancer 12/04/2021   Glucose intolerance (impaired glucose tolerance)    Hyperlipidemia    Interstitial lung disease ([place]) 07/16/2019   Long-term current use of high risk medication other than anticoagulant 07/16/2019   Other long term (current) drug therapy 12/04/2021   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  Smoked a pip in college, not daily.    Labs: Review Flowsheet  More data exists      Latest Ref Rng & Units 05/14/2019 09/07/2019 03/13/2021 12/04/2021 01/19/2022  Labs for ITP Cardiac and Pulmonary Rehab  Cholestrol 0 - 200 mg/dL 246  108  155  119  -  LDL (calc) 0 - 99 mg/dL 193  65  100  72  -  HDL-C >39.00 mg/dL 39  31  45.00  34.40  -  Trlycerides 0.0 - 149.0 mg/dL 82  51  53.0  64.0  -  Hemoglobin A1c 4.0 - 5.6 % 6.2  5.9  - - -  Bicarbonate 20.0 - 28.0 mmol/L - - - - 28.9   O2 Saturation % - - - - 99.2     Capillary Blood Glucose: No results found for: "GLUCAP"   Pulmonary Assessment Scores:  Pulmonary Assessment Scores     Row Name 03/01/22 0954         ADL UCSD   ADL Phase Entry     SOB Score total 29       CAT Score   CAT Score 22       mMRC Score   mMRC Score 3             UCSD: Self-administered rating of dyspnea associated with activities of daily living (ADLs) 6-point scale (0 =  "not at all" to 5 = "maximal or unable to do because of breathlessness")  Scoring Scores range from 0 to 120.  Minimally important difference is 5 units  CAT: CAT can identify the health impairment of COPD patients and is better correlated with disease progression.  CAT has a scoring range of zero to 40. The CAT score is classified into four groups of low (less than 10), medium (10 -  20), high (21-30) and very high (31-40) based on the impact level of disease on health status. A CAT score over 10 suggests significant symptoms.  A worsening CAT score could be explained by an exacerbation, poor medication adherence, poor inhaler technique, or progression of COPD or comorbid conditions.  CAT MCID is 2 points  mMRC: mMRC (Modified Medical Research Council) Dyspnea Scale is used to assess the degree of baseline functional disability in patients of respiratory disease due to dyspnea. No minimal important difference is established. A decrease in score of 1 point or greater is considered a positive change.   Pulmonary Function Assessment:  Pulmonary Function Assessment - 03/01/22 0954       Breath   Bilateral Breath Sounds Clear;Decreased    Shortness of Breath Yes;Limiting activity             Exercise Target Goals: Exercise Program Goal: Individual exercise prescription set using results from initial 6 min walk test and THRR while considering  patient's activity barriers and safety.   Exercise Prescription Goal: Initial exercise prescription builds to 30-45 minutes a day of aerobic activity, 2-3 days per week.  Home exercise guidelines will be given to patient during program as part of exercise prescription that the participant will acknowledge.  Activity Barriers & Risk Stratification:  Activity Barriers & Cardiac Risk Stratification - 03/01/22 0955       Activity Barriers & Cardiac Risk Stratification   Activity Barriers Arthritis;Deconditioning;Muscular Weakness;Shortness of Breath              6 Minute Walk:  6 Minute Walk     Row Name 03/01/22 1054         6 Minute Walk   Phase Initial     Distance 1252 feet     Walk Time 6 minutes     # of Rest Breaks 0     MPH 2.37     METS 3.72     RPE 12     Perceived Dyspnea  2     VO2 Peak 13.02     Symptoms No     Resting HR 110 bpm     Resting BP 98/58     Resting Oxygen Saturation  97 %     Exercise Oxygen Saturation  during 6 min walk 90 %     Max Ex. HR 122 bpm     Max Ex. BP 132/60     2 Minute Post BP 100/50       Interval HR   1 Minute HR 117     2 Minute HR 119     3 Minute HR 120     4 Minute HR 120     5 Minute HR 119     6 Minute HR 122     2 Minute Post HR 101     Interval Heart Rate? Yes       Interval Oxygen   Interval Oxygen? Yes     Baseline Oxygen Saturation % 97 %     1 Minute Oxygen Saturation % 97 %     1 Minute Liters of Oxygen 2 L     2 Minute Oxygen Saturation % 96 %     2 Minute Liters of Oxygen 2 L     3 Minute Oxygen Saturation % 97 %     3 Minute Liters of Oxygen 2 L     4 Minute Oxygen Saturation % 93 %     4  Minute Liters of Oxygen 2 L     5 Minute Oxygen Saturation % 90 %     5 Minute Liters of Oxygen 2 L     6 Minute Oxygen Saturation % 93 %     6 Minute Liters of Oxygen 2 L     2 Minute Post Oxygen Saturation % 93 %     2 Minute Post Liters of Oxygen 2 L              Oxygen Initial Assessment:  Oxygen Initial Assessment - 03/06/22 0856       Home Oxygen   Home Oxygen Device Portable Concentrator;Home Concentrator    Sleep Oxygen Prescription Continuous    Liters per minute 2    Home Exercise Oxygen Prescription Continuous    Liters per minute 2    Home Resting Oxygen Prescription Continuous    Liters per minute 2    Compliance with Home Oxygen Use Yes      Initial 6 min Walk   Oxygen Used Continuous    Liters per minute 2      Program Oxygen Prescription   Program Oxygen Prescription Continuous    Liters per minute 2       Intervention   Short Term Goals To learn and exhibit compliance with exercise, home and travel O2 prescription;To learn and understand importance of monitoring SPO2 with pulse oximeter and demonstrate accurate use of the pulse oximeter.;To learn and understand importance of maintaining oxygen saturations>88%;To learn and demonstrate proper pursed lip breathing techniques or other breathing techniques. ;To learn and demonstrate proper use of respiratory medications    Long  Term Goals Exhibits compliance with exercise, home  and travel O2 prescription;Verbalizes importance of monitoring SPO2 with pulse oximeter and return demonstration;Maintenance of O2 saturations>88%;Exhibits proper breathing techniques, such as pursed lip breathing or other method taught during program session;Compliance with respiratory medication;Demonstrates proper use of MDI's             Oxygen Re-Evaluation:  Oxygen Re-Evaluation     Row Name 03/06/22 0856 04/01/22 0914           Program Oxygen Prescription   Program Oxygen Prescription -- Continuous      Liters per minute -- 2      Comments Pt has not began exercising in class yet --        Home Oxygen   Home Oxygen Device -- Portable Concentrator;Home Concentrator      Sleep Oxygen Prescription -- Continuous      Liters per minute -- 2      Home Exercise Oxygen Prescription -- Continuous      Liters per minute -- 2      Home Resting Oxygen Prescription -- Continuous      Liters per minute -- 2      Compliance with Home Oxygen Use -- Yes        Goals/Expected Outcomes   Short Term Goals -- To learn and exhibit compliance with exercise, home and travel O2 prescription;To learn and understand importance of monitoring SPO2 with pulse oximeter and demonstrate accurate use of the pulse oximeter.;To learn and understand importance of maintaining oxygen saturations>88%;To learn and demonstrate proper pursed lip breathing techniques or other breathing techniques.  ;To learn and demonstrate proper use of respiratory medications      Long  Term Goals -- Exhibits compliance with exercise, home  and travel O2 prescription;Verbalizes importance of monitoring SPO2 with pulse oximeter and return demonstration;Maintenance  of O2 saturations>88%;Exhibits proper breathing techniques, such as pursed lip breathing or other method taught during program session;Compliance with respiratory medication;Demonstrates proper use of MDI's      Comments -- No change, maintaining 2L      Goals/Expected Outcomes Compliance and understanding of oxygen saturation monitoring and breathing techniques to decreasae shortness of breath. Compliance and understanding of oxygen saturation monitoring and breathing techniques to decreasae shortness of breath.               Oxygen Discharge (Final Oxygen Re-Evaluation):  Oxygen Re-Evaluation - 04/01/22 0914       Program Oxygen Prescription   Program Oxygen Prescription Continuous    Liters per minute 2      Home Oxygen   Home Oxygen Device Portable Concentrator;Home Concentrator    Sleep Oxygen Prescription Continuous    Liters per minute 2    Home Exercise Oxygen Prescription Continuous    Liters per minute 2    Home Resting Oxygen Prescription Continuous    Liters per minute 2    Compliance with Home Oxygen Use Yes      Goals/Expected Outcomes   Short Term Goals To learn and exhibit compliance with exercise, home and travel O2 prescription;To learn and understand importance of monitoring SPO2 with pulse oximeter and demonstrate accurate use of the pulse oximeter.;To learn and understand importance of maintaining oxygen saturations>88%;To learn and demonstrate proper pursed lip breathing techniques or other breathing techniques. ;To learn and demonstrate proper use of respiratory medications    Long  Term Goals Exhibits compliance with exercise, home  and travel O2 prescription;Verbalizes importance of monitoring SPO2 with pulse  oximeter and return demonstration;Maintenance of O2 saturations>88%;Exhibits proper breathing techniques, such as pursed lip breathing or other method taught during program session;Compliance with respiratory medication;Demonstrates proper use of MDI's    Comments No change, maintaining 2L    Goals/Expected Outcomes Compliance and understanding of oxygen saturation monitoring and breathing techniques to decreasae shortness of breath.             Initial Exercise Prescription:  Initial Exercise Prescription - 03/01/22 1100       Date of Initial Exercise RX and Referring Provider   Date 03/01/22    Referring Provider [name]   Expected Discharge Date 05/09/22      Oxygen   Oxygen Continuous    Liters 2    Maintain Oxygen Saturation 88% or higher      Recumbant Bike   Level 1    Watts 24    Minutes 15      NuStep   Level 1    SPM 60    Minutes 15      Prescription Details   Frequency (times per week) 2    Duration Progress to 30 minutes of continuous aerobic without signs/symptoms of physical distress      Intensity   THRR 40-80% of Max Heartrate 61-122    Ratings of Perceived Exertion 11-13    Perceived Dyspnea 0-4      Progression   Progression Continue to progress workloads to maintain intensity without signs/symptoms of physical distress.      Resistance Training   Training Prescription Yes    Weight red bands    Reps 10-15             Perform Capillary Blood Glucose checks as needed.  Exercise Prescription Changes:   Exercise Prescription Changes     Row Name 03/12/22 1500 03/26/22 1500  Response to Exercise   Blood Pressure (Admit) 98/60 102/58      Blood Pressure (Exercise) 110/60 116/70      Blood Pressure (Exit) 100/54 104/60      Heart Rate (Admit) 128 bpm 115 bpm      Heart Rate (Exercise) 140 bpm 115 bpm      Heart Rate (Exit) 125 bpm 115 bpm      Oxygen Saturation (Admit) 97 % 98 %      Oxygen Saturation (Exercise)  93 % 96 %      Oxygen Saturation (Exit) 96 % 97 %      Rating of Perceived Exertion (Exercise) 13 11      Perceived Dyspnea (Exercise) 1 1      Duration Continue with 30 min of aerobic exercise without signs/symptoms of physical distress. Continue with 30 min of aerobic exercise without signs/symptoms of physical distress.      Intensity THRR unchanged THRR unchanged        Progression   Progression Continue to progress workloads to maintain intensity without signs/symptoms of physical distress. Continue to progress workloads to maintain intensity without signs/symptoms of physical distress.        Resistance Training   Training Prescription Yes Yes      Weight red bands red bands      Reps 10-15 10-15      Time 10 Minutes 10 Minutes        Oxygen   Oxygen Continuous Continuous      Liters 2 2        Recumbant Bike   Level 1 --      Minutes 15 --      METs 1.9 --        NuStep   Level 1 2      SPM 60 60      Minutes 15 30      METs 1.2 1.4        Oxygen   Maintain Oxygen Saturation 88% or higher 88% or higher               Exercise Comments:   Exercise Comments     Row Name 03/07/22 1506           Exercise Comments Pt completed first day of exerccise. Pt exercised on the recumbent bike for 15 min at level 1, METs 2.0. Then exercised on nustep for 15 min at level 1, METs 1.9. He needed rest stops intermittently due to fatigue/SOB and increased HR. Tolerated exercise fair. Performed warm up and cool down standing and sit to stand in place of squats. Discussed with pt METs with reception.                Exercise Goals and Review:   Exercise Goals     Row Name 03/01/22 0955 03/06/22 0850 04/01/22 0905         Exercise Goals   Increase Physical Activity Yes Yes Yes     Intervention Provide advice, education, support and counseling about physical activity/exercise needs.;Develop an individualized exercise prescription for aerobic and resistive training  based on initial evaluation findings, risk stratification, comorbidities and participant's personal goals. Provide advice, education, support and counseling about physical activity/exercise needs.;Develop an individualized exercise prescription for aerobic and resistive training based on initial evaluation findings, risk stratification, comorbidities and participant's personal goals. Provide advice, education, support and counseling about physical activity/exercise needs.;Develop an individualized exercise prescription for aerobic and resistive training based on initial  evaluation findings, risk stratification, comorbidities and participant's personal goals.     Expected Outcomes Short Term: Attend rehab on a regular basis to increase amount of physical activity.;Long Term: Add in home exercise to make exercise part of routine and to increase amount of physical activity.;Long Term: Exercising regularly at least 3-5 days a week. Short Term: Attend rehab on a regular basis to increase amount of physical activity.;Long Term: Add in home exercise to make exercise part of routine and to increase amount of physical activity.;Long Term: Exercising regularly at least 3-5 days a week. Short Term: Attend rehab on a regular basis to increase amount of physical activity.;Long Term: Add in home exercise to make exercise part of routine and to increase amount of physical activity.;Long Term: Exercising regularly at least 3-5 days a week.     Increase Strength and Stamina Yes Yes Yes     Intervention Provide advice, education, support and counseling about physical activity/exercise needs.;Develop an individualized exercise prescription for aerobic and resistive training based on initial evaluation findings, risk stratification, comorbidities and participant's personal goals. Provide advice, education, support and counseling about physical activity/exercise needs.;Develop an individualized exercise prescription for aerobic and  resistive training based on initial evaluation findings, risk stratification, comorbidities and participant's personal goals. Provide advice, education, support and counseling about physical activity/exercise needs.;Develop an individualized exercise prescription for aerobic and resistive training based on initial evaluation findings, risk stratification, comorbidities and participant's personal goals.     Expected Outcomes Short Term: Increase workloads from initial exercise prescription for resistance, speed, and METs.;Short Term: Perform resistance training exercises routinely during rehab and add in resistance training at home;Long Term: Improve cardiorespiratory fitness, muscular endurance and strength as measured by increased METs and functional capacity (6MWT) Short Term: Increase workloads from initial exercise prescription for resistance, speed, and METs.;Short Term: Perform resistance training exercises routinely during rehab and add in resistance training at home;Long Term: Improve cardiorespiratory fitness, muscular endurance and strength as measured by increased METs and functional capacity (6MWT) Short Term: Increase workloads from initial exercise prescription for resistance, speed, and METs.;Short Term: Perform resistance training exercises routinely during rehab and add in resistance training at home;Long Term: Improve cardiorespiratory fitness, muscular endurance and strength as measured by increased METs and functional capacity (6MWT)     Able to understand and use rate of perceived exertion (RPE) scale Yes Yes Yes     Intervention Provide education and explanation on how to use RPE scale Provide education and explanation on how to use RPE scale Provide education and explanation on how to use RPE scale     Expected Outcomes Short Term: Able to use RPE daily in rehab to express subjective intensity level;Long Term:  Able to use RPE to guide intensity level when exercising independently Short  Term: Able to use RPE daily in rehab to express subjective intensity level;Long Term:  Able to use RPE to guide intensity level when exercising independently Short Term: Able to use RPE daily in rehab to express subjective intensity level;Long Term:  Able to use RPE to guide intensity level when exercising independently     Able to understand and use Dyspnea scale Yes Yes Yes     Intervention Provide education and explanation on how to use Dyspnea scale Provide education and explanation on how to use Dyspnea scale Provide education and explanation on how to use Dyspnea scale     Expected Outcomes Short Term: Able to use Dyspnea scale daily in rehab to express subjective sense of  shortness of breath during exertion;Long Term: Able to use Dyspnea scale to guide intensity level when exercising independently Short Term: Able to use Dyspnea scale daily in rehab to express subjective sense of shortness of breath during exertion;Long Term: Able to use Dyspnea scale to guide intensity level when exercising independently Short Term: Able to use Dyspnea scale daily in rehab to express subjective sense of shortness of breath during exertion;Long Term: Able to use Dyspnea scale to guide intensity level when exercising independently     Knowledge and understanding of Target Heart Rate Range (THRR) Yes Yes Yes     Intervention Provide education and explanation of THRR including how the numbers were predicted and where they are located for reference Provide education and explanation of THRR including how the numbers were predicted and where they are located for reference Provide education and explanation of THRR including how the numbers were predicted and where they are located for reference     Expected Outcomes Short Term: Able to state/look up THRR;Long Term: Able to use THRR to govern intensity when exercising independently;Short Term: Able to use daily as guideline for intensity in rehab Short Term: Able to state/look  up THRR;Long Term: Able to use THRR to govern intensity when exercising independently;Short Term: Able to use daily as guideline for intensity in rehab Short Term: Able to state/look up THRR;Long Term: Able to use THRR to govern intensity when exercising independently;Short Term: Able to use daily as guideline for intensity in rehab     Understanding of Exercise Prescription Yes Yes Yes     Intervention Provide education, explanation, and written materials on patient's individual exercise prescription Provide education, explanation, and written materials on patient's individual exercise prescription Provide education, explanation, and written materials on patient's individual exercise prescription     Expected Outcomes Short Term: Able to explain program exercise prescription;Long Term: Able to explain home exercise prescription to exercise independently Short Term: Able to explain program exercise prescription;Long Term: Able to explain home exercise prescription to exercise independently Short Term: Able to explain program exercise prescription;Long Term: Able to explain home exercise prescription to exercise independently              Exercise Goals Re-Evaluation :  Exercise Goals Re-Evaluation     Row Name 03/06/22 0851 04/01/22 0905           Exercise Goal Re-Evaluation   Exercise Goals Review Increase Physical Activity;Increase Strength and Stamina;Able to understand and use rate of perceived exertion (RPE) scale;Able to understand and use Dyspnea scale;Knowledge and understanding of Target Heart Rate Range (THRR);Understanding of Exercise Prescription Increase Physical Activity;Increase Strength and Stamina;Able to understand and use rate of perceived exertion (RPE) scale;Able to understand and use Dyspnea scale;Knowledge and understanding of Target Heart Rate Range (THRR);Understanding of Exercise Prescription      Comments Pt is scheduled to begin exercise this week. Will monitor and  progress as able. Pt has completed 7 exercise sessions and has not missed any. He is exercising on the nustep for 30 min, level 2, and has progressed to 1.6 METs. He is now tolerating exercise well, HR is stable. He performs warm up and cool down standing.  Will monitor and progress as able. Will discuss home exercise soon.      Expected Outcomes Through exercise at rehab and home, the patient will decrease shortness of breath with daily activities and feel confident in carrying out an exercise regimen at home. Through exercise at rehab and home, the patient  will decrease shortness of breath with daily activities and feel confident in carrying out an exercise regimen at home.               Discharge Exercise Prescription (Final Exercise Prescription Changes):  Exercise Prescription Changes - 03/26/22 1500       Response to Exercise   Blood Pressure (Admit) 102/58    Blood Pressure (Exercise) 116/70    Blood Pressure (Exit) 104/60    Heart Rate (Admit) 115 bpm    Heart Rate (Exercise) 115 bpm    Heart Rate (Exit) 115 bpm    Oxygen Saturation (Admit) 98 %    Oxygen Saturation (Exercise) 96 %    Oxygen Saturation (Exit) 97 %    Rating of Perceived Exertion (Exercise) 11    Perceived Dyspnea (Exercise) 1    Duration Continue with 30 min of aerobic exercise without signs/symptoms of physical distress.    Intensity THRR unchanged      Progression   Progression Continue to progress workloads to maintain intensity without signs/symptoms of physical distress.      Resistance Training   Training Prescription Yes    Weight red bands    Reps 10-15    Time 10 Minutes      Oxygen   Oxygen Continuous    Liters 2      NuStep   Level 2    SPM 60    Minutes 30    METs 1.4      Oxygen   Maintain Oxygen Saturation 88% or higher             Nutrition:  Target Goals: Understanding of nutrition guidelines, daily intake of sodium <1529m, cholesterol <2071m calories 30% from fat and  7% or less from saturated fats, daily to have 5 or more servings of fruits and vegetables.  Biometrics:  Pre Biometrics - 03/01/22 0941       Pre Biometrics   Grip Strength 20 kg              Nutrition Therapy Plan and Nutrition Goals:  Nutrition Therapy & Goals - 04/02/22 1444       Nutrition Therapy   Diet Heart Healthy Diet      Personal Nutrition Goals   Nutrition Goal Patient to identify strategies for weight gain of 0.5-2.0# per week of weight gain    Personal Goal #2 Patient to choose a daily variety of fruits, vegetables, whole grains, lean protein/plant protein, and dairy as part of heart healthy lifestyle    Comments Goals in progress. MiKollynontinues 2-3 nutrition supplements daily + three meals daily. He reports great appetite. His family remains very supportive. He did have one incidence of vomiting after drinking high calorie Ensure; he is receptive to trying some alternative ways to add calories. Gave recipes for high calorie desserts using Ensure, unflavored protein powder, and high calorie food add-ins. He is up 5.2# since starting our program.      Intervention Plan   Intervention Prescribe, educate and counsel regarding individualized specific dietary modifications aiming towards targeted core components such as weight, hypertension, lipid management, diabetes, heart failure and other comorbidities.;Nutrition handout(s) given to patient.    Expected Outcomes Short Term Goal: Understand basic principles of dietary content, such as calories, fat, sodium, cholesterol and nutrients.;Long Term Goal: Adherence to prescribed nutrition plan.             Nutrition Assessments:  MEDIFICTS Score Key: ?70 Need to make dietary changes  40-70 Heart Healthy Diet ? 40 Therapeutic Level Cholesterol Diet   Picture Your Plate Scores: <40 Unhealthy dietary pattern with much room for improvement. 41-50 Dietary pattern unlikely to meet recommendations for good health  and room for improvement. 51-60 More healthful dietary pattern, with some room for improvement.  >60 Healthy dietary pattern, although there may be some specific behaviors that could be improved.    Nutrition Goals Re-Evaluation:  Nutrition Goals Re-Evaluation     [place] Name 03/07/22 1427 04/02/22 1444           Goals   Current Weight 101 lb 3.1 oz (45.9 kg) 103 lb 6.3 oz (46.9 kg)      Comment lipid panel WNL, A1c 6.0; patient is up 2.86# since his orientation on 8/25 (start weight 44.6kg). No new labs at this time.      Expected Outcome Goals in action. 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 Abrian is drinking Boost (530kcals, 22g protein) for breakfast and smoothie in the afternoon with protein powder and peanut butter. He is taking remeron and continues three regular meals daily + supplements. Per wife, he trying to gain up to 112-113# for transplant. Per past RD notes, patient was weighing 123# in January 2023. Goals in progress. Arman continues 2-3 nutrition supplements daily + three meals daily. He reports great appetite. His family remains very supportive. He did have one incidence of vomiting after drinking high calorie Ensure (530kcals, 22g protein); he is receptive to trying some alternative ways to add calories. He reports no other GI concerns. Gave recipes for high calorie desserts using Ensure, unflavored protein powder, and high calorie food add-ins. He is up 5.2# since starting our program.               Nutrition Goals Discharge (Final Nutrition Goals Re-Evaluation):  Nutrition Goals Re-Evaluation - 04/02/22 1444       Goals   Current Weight 103 lb 6.3 oz (46.9 kg)    Comment No new labs at this time.    Expected Outcome Goals in progress. Lukus continues 2-3 nutrition supplements daily + three meals daily. He reports great appetite. His family remains very supportive. He did have  one incidence of vomiting after drinking high calorie Ensure (530kcals, 22g protein); he is receptive to trying some alternative ways to add calories. He reports no other GI concerns. Gave recipes for high calorie desserts using Ensure, unflavored protein powder, and high calorie food add-ins. He is up 5.2# since starting our program.             Psychosocial: Target Goals: Acknowledge presence or absence of significant depression and/or stress, maximize coping skills, provide positive support system. Participant is able to verbalize types and ability to use techniques and skills needed for reducing stress and depression.  Initial Review & Psychosocial Screening:  Initial Psych Review & Screening - 03/01/22 0949       Initial Review   Current issues with None Identified      Family Dynamics   Good Support System? Yes    Comments wife and children, friends      Barriers   Psychosocial barriers to participate in program There are no identifiable barriers or psychosocial needs.      Screening Interventions   Interventions Encouraged to exercise             Quality of Life Scores:  Scores of 19  and below usually indicate a poorer quality of life in these areas.  A difference of  2-3 points is a clinically meaningful difference.  A difference of 2-3 points in the total score of the Quality of Life Index has been associated with significant improvement in overall quality of life, self-image, physical symptoms, and general health in studies assessing change in quality of life.  PHQ-9: Review Flowsheet  More data exists      03/01/2022 01/28/2022 12/04/2021 11/19/2021 06/06/2021  Depression screen PHQ 2/9  Decreased Interest 0 0 0 0 0  Down, Depressed, Hopeless 0 0 0 0 0  PHQ - 2 Score 0 0 0 0 0  Altered sleeping 0 - - - -  Tired, decreased energy 0 - - - -  Change in appetite 0 - - - -  Feeling bad or failure about yourself  0 - - - -  Trouble concentrating 0 - - - -  Moving  slowly or fidgety/restless 0 - - - -  Suicidal thoughts 0 - - - -  PHQ-9 Score 0 - - - -   Interpretation of Total Score  Total Score Depression Severity:  1-4 = Minimal depression, 5-9 = Mild depression, 10-14 = Moderate depression, 15-19 = Moderately severe depression, 20-27 = Severe depression   Psychosocial Evaluation and Intervention:  Psychosocial Evaluation - 03/01/22 0949       Psychosocial Evaluation & Interventions   Interventions Encouraged to exercise with the program and follow exercise prescription    Expected Outcomes For Bartolo to participate in Pulmonary Rehab    Continue Psychosocial Services  Follow up required by staff             Psychosocial Re-Evaluation:  Psychosocial Re-Evaluation     Row Name 03/12/22 1002 04/02/22 0936           Psychosocial Re-Evaluation   Current issues with None Identified None Identified      Comments Tayton has attended only one session thus far. He had no issues. We will continue to monitor for any psychosocial barriers. Jeremie has been participating in the PR program without any current psychosocial barriers. He seems to be enjoying the classes and making social connections with other class members.      Expected Outcomes For him to particiapte in the PR program without any psychosocial concerns. For him to continue to attend the program without any psychosocial issues or concerns.      Interventions Encouraged to attend Pulmonary Rehabilitation for the exercise Encouraged to attend Pulmonary Rehabilitation for the exercise      Continue Psychosocial Services  No Follow up required No Follow up required               Psychosocial Discharge (Final Psychosocial Re-Evaluation):  Psychosocial Re-Evaluation - 04/02/22 0936       Psychosocial Re-Evaluation   Current issues with None Identified    Comments Antwion has been participating in the PR program without any current psychosocial barriers. He seems to be enjoying  the classes and making social connections with other class members.    Expected Outcomes For him to continue to attend the program without any psychosocial issues or concerns.    Interventions Encouraged to attend Pulmonary Rehabilitation for the exercise    Continue Psychosocial Services  No Follow up required             Education: Education Goals: Education classes will be provided on a weekly basis, covering required topics. Participant will  state understanding/return demonstration of topics presented.  Learning Barriers/Preferences:  Learning Barriers/Preferences - 03/01/22 0950       Learning Barriers/Preferences   Learning Barriers Sight   wears glasses   Learning Preferences None             Education Topics: Introduction to Pulmonary Rehab Group instruction provided by PowerPoint, verbal discussion, and written material to support subject matter. Instructor reviews what Pulmonary Rehab is, the purpose of the program, and how patients are referred.     Know Your Numbers Group instruction that is supported by a PowerPoint presentation. Instructor discusses importance of knowing and understanding resting, exercise, and post-exercise oxygen saturation, heart rate, and blood pressure. Oxygen saturation, heart rate, blood pressure, rating of perceived exertion, and dyspnea are reviewed along with a normal range for these values.    Exercise for the Pulmonary Patient Group instruction that is supported by a PowerPoint presentation. Instructor discusses benefits of exercise, core components of exercise, frequency, duration, and intensity of an exercise routine, importance of utilizing pulse oximetry during exercise, safety while exercising, and options of places to exercise outside of rehab.       MET Level  Group instruction provided by PowerPoint, verbal discussion, and written material to support subject matter. Instructor reviews what METs are and how to increase  METs.    Pulmonary Medications Verbally interactive group education provided by instructor with focus on inhaled medications and proper administration.   Anatomy and Physiology of the Respiratory System Group instruction provided by PowerPoint, verbal discussion, and written material to support subject matter. Instructor reviews respiratory cycle and anatomical components of the respiratory system and their functions. Instructor also reviews differences in obstructive and restrictive respiratory diseases with examples of each.  Flowsheet Row PULMONARY REHAB OTHER RESPIRATORY from 03/28/2022 in [place]  Date 03/07/22  Educator Donnetta Simpers  Instruction Review Code 1- Verbalizes Understanding       Oxygen Safety Group instruction provided by PowerPoint, verbal discussion, and written material to support subject matter. There is an overview of "What is Oxygen" and "Why do we need it".  Instructor also reviews how to create a safe environment for oxygen use, the importance of using oxygen as prescribed, and the risks of noncompliance. There is a brief discussion on traveling with oxygen and resources the patient may utilize. Flowsheet Row PULMONARY REHAB OTHER RESPIRATORY from 03/28/2022 in [place]  Date 03/14/22  Educator Kelle Darting  Instruction Review Code 1- Verbalizes Understanding       Oxygen Use Group instruction provided by PowerPoint, verbal discussion, and written material to discuss how supplemental oxygen is prescribed and different types of oxygen supply systems. Resources for more information are provided.  Flowsheet Row PULMONARY REHAB OTHER RESPIRATORY from 03/28/2022 in [place]  Date 03/21/22  Educator Kelle Darting  Instruction Review Code 1- Verbalizes Understanding       Breathing Techniques Group instruction that is supported by demonstration and informational handouts.  Instructor discusses the benefits of pursed lip and diaphragmatic breathing and detailed demonstration on how to perform both.  Flowsheet Row PULMONARY REHAB OTHER RESPIRATORY from 03/28/2022 in [place]  Date 03/28/22  Educator EP  Instruction Review Code 1- Verbalizes Understanding        Risk Factor Reduction Group instruction that is supported by a PowerPoint presentation. Instructor discusses the definition of a risk factor, different risk factors for pulmonary disease, and how  the heart and lungs work together.   MD Day A group question and answer session with a medical doctor that allows participants to ask questions that relate to their pulmonary disease state.   Nutrition for the Pulmonary Patient Group instruction provided by PowerPoint slides, verbal discussion, and written materials to support subject matter. The instructor gives an explanation and review of healthy diet recommendations, which includes a discussion on weight management, recommendations for fruit and vegetable consumption, as well as protein, fluid, caffeine, fiber, sodium, sugar, and alcohol. Tips for eating when patients are short of breath are discussed.    Other Education Group or individual verbal, written, or video instructions that support the educational goals of the pulmonary rehab program.    Knowledge Questionnaire Score:  Knowledge Questionnaire Score - 03/01/22 0959       Knowledge Questionnaire Score   Pre Score 15/18             Core Components/Risk Factors/Patient Goals at Admission:  Personal Goals and Risk Factors at Admission - 03/01/22 0951       Core Components/Risk Factors/Patient Goals on Admission    Weight Management Weight Gain    Improve shortness of breath with ADL's Yes    Intervention Provide education, individualized exercise plan and daily activity instruction to help decrease symptoms of SOB with activities of daily living.     Expected Outcomes Short Term: Improve cardiorespiratory fitness to achieve a reduction of symptoms when performing ADLs;Long Term: Be able to perform more ADLs without symptoms or delay the onset of symptoms             Core Components/Risk Factors/Patient Goals Review:   Goals and Risk Factor Review     Row Name 03/12/22 1546 04/02/22 0940           Core Components/Risk Factors/Patient Goals Review   Personal Goals Review Other;Improve shortness of breath with ADL's;Develop more efficient breathing techniques such as purse lipped breathing and diaphragmatic breathing and practicing self-pacing with activity.;Increase knowledge of respiratory medications and ability to use respiratory devices properly. Weight Management/Obesity;Improve shortness of breath with ADL's;Develop more efficient breathing techniques such as purse lipped breathing and diaphragmatic breathing and practicing self-pacing with activity.;Increase knowledge of respiratory medications and ability to use respiratory devices properly.      Review Johnaton is just getting started in the PR program and has attended 1 session. We will continue to his core components. Ayodeji has had weight gain from starting the program at 45.9 kg ro currently being up to 47.1 kg. He has had consultations with the dietician. He is exercising on the nustep with slow progress with increasing his workloads and METS. He is reporting only mild SOB with exercise and his oxygen saturations have been 93-96% on his perscribed 2 liters O2. He did attempt the recumbent bike when he first started the program but, it was to difficult for him and caused his heart rate to go above his maximun.      Expected Outcomes See admission goals See admission goals.               Core Components/Risk Factors/Patient Goals at Discharge (Final Review):   Goals and Risk Factor Review - 04/02/22 0940       Core Components/Risk Factors/Patient Goals Review   Personal  Goals Review Weight Management/Obesity;Improve shortness of breath with ADL's;Develop more efficient breathing techniques such as purse lipped breathing and diaphragmatic breathing and practicing self-pacing with activity.;Increase knowledge of respiratory medications and  ability to use respiratory devices properly.    Review Ikeem has had weight gain from starting the program at 45.9 kg ro currently being up to 47.1 kg. He has had consultations with the dietician. He is exercising on the nustep with slow progress with increasing his workloads and METS. He is reporting only mild SOB with exercise and his oxygen saturations have been 93-96% on his perscribed 2 liters O2. He did attempt the recumbent bike when he first started the program but, it was to difficult for him and caused his heart rate to go above his maximun.    Expected Outcomes See admission goals.             ITP Comments: Pt is making expected progress toward Pulmonary Rehab goals after completing 8 sessions. Recommend continued exercise, life style modification, education, and utilization of breathing techniques to increase stamina and strength, while also decreasing shortness of breath with exertion.  Dr. Rodman Pickle is Medical Director for Pulmonary Rehab at [facility].

## 2022-04-04 ENCOUNTER — Encounter (HOSPITAL_COMMUNITY): Payer: Medicare Other

## 2022-04-04 ENCOUNTER — Encounter (HOSPITAL_COMMUNITY)
Admission: RE | Admit: 2022-04-04 | Discharge: 2022-04-04 | Disposition: A | Discharge status: Home / Self Care | Payer: Medicare Other | Source: Ambulatory Visit | Attending: Pulmonary Disease | Admitting: Pulmonary Disease

## 2022-04-04 DIAGNOSIS — R0902 Hypoxemia: Secondary | ICD-10-CM

## 2022-04-04 NOTE — Progress Notes (Signed)
Home Exercise Prescription I have reviewed a Home Exercise Prescription with Earl Thomas. He is currently doing warm up and cool down exercises daily at home. Encouraged pt to start walking in house or outside, starting with 5 min and increase to 15-30 min as able. He agreed.The patient stated that their goals were to continue to improve lung function. We reviewed exercise guidelines, target heart rate during exercise, RPE Scale, weather conditions, endpoints for exercise, warmup and cool down. The patient is encouraged to come to me with any questions. I will continue to follow up with the patient to assist them with progression and safety.    Earl Thomas, [place], ACSM-CEP 04/04/2022 3:49 PM

## 2022-04-04 NOTE — Progress Notes (Signed)
Daily Session Note  Patient Details  Name: Earl Thomas MRN: [id number] Date of Birth: [birth year] Referring Provider:   [name] Pulmonary Rehab Walk Test from 03/01/2022 in [place]  Referring Provider [name]       Encounter Date: 04/04/2022  Check In:  Session Check In - 04/04/22 1430       Check-In   Supervising physician immediately available to respond to emergencies [facility] - Physician supervision    Physician(s) Dr. Timoteo Gaul    Location [facility]    Staff Present Rodney Langton, Cathleen Fears, MS, ACSM-CEP, Exercise Physiologist;Randi Yevonne Pax, ACSM-CEP, Exercise Physiologist;Carlette Wilber Oliphant, RN, BSN    Virtual Visit No    Medication changes reported     No    Fall or balance concerns reported    No    Tobacco Cessation No Change    Warm-up and Cool-down Performed as group-led instruction    Resistance Training Performed Yes    VAD Patient? No    PAD/SET Patient? No      Pain Assessment   Currently in Pain? No/denies    Multiple Pain Sites No             Capillary Blood Glucose: No results found for this or any previous visit (from the past 24 hour(s)).    Social History   Tobacco Use  Smoking Status Former   Types: Pipe   Quit date: 10/06/1988   Years since quitting: 33.5   Passive exposure: Never  Smokeless Tobacco Never  Tobacco Comments   parent smoked in home as a child.  Smoked a pip in college, not daily.    Goals Met:  Proper associated with RPD/PD & O2 Sat Exercise tolerated well No report of concerns or symptoms today Strength training completed today  Goals Unmet:  Not Applicable  Comments: Service time is from 1336 to 1456    Dr. Rodman Pickle is Medical Director for Pulmonary Rehab at [facility].

## 2022-04-09 ENCOUNTER — Encounter (HOSPITAL_COMMUNITY): Payer: Medicare Other

## 2022-04-09 ENCOUNTER — Encounter (HOSPITAL_COMMUNITY)
Admission: RE | Admit: 2022-04-09 | Discharge: 2022-04-09 | Disposition: A | Discharge status: Home / Self Care | Payer: Medicare Other | Source: Ambulatory Visit | Attending: Pulmonary Disease | Admitting: Pulmonary Disease

## 2022-04-09 VITALS — Wt 104.1 lb

## 2022-04-09 DIAGNOSIS — R0902 Hypoxemia: Secondary | ICD-10-CM | POA: Insufficient documentation

## 2022-04-09 DIAGNOSIS — Z79899 Other long term (current) drug therapy: Secondary | ICD-10-CM | POA: Diagnosis not present

## 2022-04-09 DIAGNOSIS — M0579 Rheumatoid arthritis with rheumatoid factor of multiple sites without organ or systems involvement: Secondary | ICD-10-CM | POA: Diagnosis not present

## 2022-04-09 NOTE — Progress Notes (Signed)
Daily Session Note  Patient Details  Name: Earl Thomas MRN: [id number] Date of Birth: [birth year] Referring Provider:   [name] Pulmonary Rehab Walk Test from 03/01/2022 in [place]  Referring Provider [name]       Encounter Date: 04/09/2022  Check In:  Session Check In - 04/09/22 1426       Check-In   Supervising physician immediately available to respond to emergencies [facility] - Physician supervision    Physician(s) Shearon Stalls    Location [facility]    Staff Present Rodney Langton, Cathleen Fears, MS, ACSM-CEP, Exercise Physiologist;Randi Yevonne Pax, ACSM-CEP, Exercise Physiologist;Carlette Wilber Oliphant, RN, BSN    Virtual Visit No    Medication changes reported     No    Fall or balance concerns reported    No    Tobacco Cessation No Change    Warm-up and Cool-down Performed as group-led instruction    Resistance Training Performed Yes    VAD Patient? No    PAD/SET Patient? No      Pain Assessment   Currently in Pain? No/denies    Multiple Pain Sites No             Capillary Blood Glucose: No results found for this or any previous visit (from the past 24 hour(s)).   Exercise Prescription Changes - 04/09/22 1500       Response to Exercise   Blood Pressure (Admit) 112/70    Blood Pressure (Exercise) 140/68    Blood Pressure (Exit) 100/34    Heart Rate (Admit) 105 bpm    Heart Rate (Exercise) 116 bpm    Heart Rate (Exit) 110 bpm    Oxygen Saturation (Admit) 99 %    Oxygen Saturation (Exercise) 96 %    Oxygen Saturation (Exit) 94 %    Rating of Perceived Exertion (Exercise) 11    Perceived Dyspnea (Exercise) 1    Duration Continue with 30 min of aerobic exercise without signs/symptoms of physical distress.    Intensity THRR unchanged      Progression   Progression Continue to progress workloads to maintain intensity without signs/symptoms of physical distress.      Resistance Training   Training  Prescription Yes    Weight red bands    Reps 10-15    Time 10 Minutes      Oxygen   Oxygen Continuous    Liters 2      NuStep   Level 3    SPM 60    Minutes 30    METs 1.7      Oxygen   Maintain Oxygen Saturation 88% or higher             Social History   Tobacco Use  Smoking Status Former   Types: Pipe   Quit date: 10/06/1988   Years since quitting: 33.5   Passive exposure: Never  Smokeless Tobacco Never  Tobacco Comments   parent smoked in home as a child.  Smoked a pip in college, not daily.    Goals Met:  Proper associated with RPD/PD & O2 Sat Independence with exercise equipment Exercise tolerated well No report of concerns or symptoms today Strength training completed today  Goals Unmet:  Not Applicable  Comments: Service time is from 1315 to 1436.    Dr. Rodman Pickle is Medical Director for Pulmonary Rehab at [facility].

## 2022-04-11 ENCOUNTER — Encounter (HOSPITAL_COMMUNITY): Payer: Medicare Other

## 2022-04-11 ENCOUNTER — Encounter (HOSPITAL_COMMUNITY)
Admission: RE | Admit: 2022-04-11 | Discharge: 2022-04-11 | Disposition: A | Discharge status: Home / Self Care | Payer: Medicare Other | Source: Ambulatory Visit | Attending: Pulmonary Disease | Admitting: Pulmonary Disease

## 2022-04-11 DIAGNOSIS — R0902 Hypoxemia: Secondary | ICD-10-CM

## 2022-04-11 NOTE — Progress Notes (Signed)
Daily Session Note  Patient Details  Name: Kamaury Cutbirth MRN: [id number] Date of Birth: [birth year] Referring Provider:   [name] Pulmonary Rehab Walk Test from 03/01/2022 in [place]  Referring Provider [name]       Encounter Date: 04/11/2022  Check In:  Session Check In - 04/11/22 1341       Check-In   Supervising physician immediately available to respond to emergencies [facility] - Physician supervision    Physician(s) Shearon Stalls    [facility]    Staff Present Rodney Langton, Cathleen Fears, MS, ACSM-CEP, Exercise Physiologist;Randi Olen Cordial BS, ACSM-CEP, Exercise Physiologist;David Makemson, MS, ACSM-CEP, CCRP, Exercise Physiologist    Virtual Visit No    Medication changes reported     No    Fall or balance concerns reported    No    Tobacco Cessation No Change    Warm-up and Cool-down Performed as group-led instruction    Resistance Training Performed Yes    VAD Patient? No    PAD/SET Patient? No      Pain Assessment   Currently in Pain? No/denies    Multiple Pain Sites No             Capillary Blood Glucose: No results found for this or any previous visit (from the past 24 hour(s)).    Social History   Tobacco Use  Smoking Status Former   Types: Pipe   Quit date: 10/06/1988   Years since quitting: 33.5   Passive exposure: Never  Smokeless Tobacco Never  Tobacco Comments   parent smoked in home as a child.  Smoked a pip in college, not daily.    Goals Met:  Proper associated with RPD/PD & O2 Sat Exercise tolerated well No report of concerns or symptoms today Strength training completed today  Goals Unmet:  Not Applicable  Comments: Service time is from 1330 to 1443     Dr. Rodman Pickle is Medical Director for Pulmonary Rehab at [facility].

## 2022-04-16 ENCOUNTER — Other Ambulatory Visit: Payer: Self-pay | Admitting: Family Medicine

## 2022-04-16 ENCOUNTER — Encounter (HOSPITAL_COMMUNITY): Payer: Medicare Other

## 2022-04-16 ENCOUNTER — Encounter (HOSPITAL_COMMUNITY)
Admission: RE | Admit: 2022-04-16 | Discharge: 2022-04-16 | Disposition: A | Discharge status: Home / Self Care | Payer: Medicare Other | Source: Ambulatory Visit | Attending: Pulmonary Disease | Admitting: Pulmonary Disease

## 2022-04-16 DIAGNOSIS — R0902 Hypoxemia: Secondary | ICD-10-CM

## 2022-04-16 DIAGNOSIS — E7800 Pure hypercholesterolemia, unspecified: Secondary | ICD-10-CM

## 2022-04-16 NOTE — Progress Notes (Signed)
Daily Session Note  Patient Details  Name: Earl Thomas MRN: [id number] Date of Birth: 04/30/1951 Referring Provider:   Flowsheet Row Pulmonary Rehab Walk Test from 03/01/2022 in [facility]  Referring Provider Fried  [Ellison]       Encounter Date: 04/16/2022  Check In:  Session Check In - 04/16/22 1332       Check-In   Supervising physician immediately available to respond to emergencies [facility] - Physician supervision    Physician(s) Dan Smith    [facility]    Staff Present  , RN;Kaylee Davis, MS, ACSM-CEP, Exercise Physiologist;Randi Reeve BS, ACSM-CEP, Exercise Physiologist;David Makemson, MS, ACSM-CEP, CCRP, Exercise Physiologist;Samantha Spain, RD, LDN    Virtual Visit No    Medication changes reported     No    Fall or balance concerns reported    No    Tobacco Cessation No Change    Warm-up and Cool-down Performed as group-led instruction    Resistance Training Performed Yes    VAD Patient? No    PAD/SET Patient? No      Pain Assessment   Currently in Pain? No/denies    Multiple Pain Sites No             Capillary Blood Glucose: No results found for this or any previous visit (from the past 24 hour(s)).    Social History   Tobacco Use  Smoking Status Former   Types: Pipe   Quit date: 10/06/1988   Years since quitting: 33.5   Passive exposure: Never  Smokeless Tobacco Never  Tobacco Comments   parent smoked in home as a child.  Smoked a pip in college, not daily.    Goals Met:  Proper associated with RPD/PD & O2 Sat Exercise tolerated well No report of concerns or symptoms today Strength training completed today  Goals Unmet:  Not Applicable  Comments: Service time is from 1320 to 1437    Dr. Jane Ellison is Medical Director for Pulmonary Rehab at [facility].  

## 2022-04-18 ENCOUNTER — Encounter (HOSPITAL_COMMUNITY): Payer: Medicare Other

## 2022-04-18 ENCOUNTER — Ambulatory Visit (INDEPENDENT_AMBULATORY_CARE_PROVIDER_SITE_OTHER): Payer: Medicare Other | Admitting: Internal Medicine

## 2022-04-18 ENCOUNTER — Encounter (HOSPITAL_COMMUNITY)
Admission: RE | Admit: 2022-04-18 | Discharge: 2022-04-18 | Disposition: A | Discharge status: Home / Self Care | Payer: Medicare Other | Source: Ambulatory Visit | Attending: Pulmonary Disease | Admitting: Pulmonary Disease

## 2022-04-18 ENCOUNTER — Encounter: Payer: Self-pay | Admitting: Internal Medicine

## 2022-04-18 ENCOUNTER — Other Ambulatory Visit (HOSPITAL_COMMUNITY): Payer: Self-pay

## 2022-04-18 ENCOUNTER — Telehealth: Payer: Self-pay

## 2022-04-18 VITALS — BP 100/70 | HR 110 | Temp 98.0°F | Ht 65.0 in | Wt 103.6 lb

## 2022-04-18 DIAGNOSIS — Z9981 Dependence on supplemental oxygen: Secondary | ICD-10-CM

## 2022-04-18 DIAGNOSIS — R051 Acute cough: Secondary | ICD-10-CM | POA: Diagnosis not present

## 2022-04-18 DIAGNOSIS — R1310 Dysphagia, unspecified: Secondary | ICD-10-CM | POA: Diagnosis not present

## 2022-04-18 DIAGNOSIS — J849 Interstitial pulmonary disease, unspecified: Secondary | ICD-10-CM

## 2022-04-18 DIAGNOSIS — T17908A Unspecified foreign body in respiratory tract, part unspecified causing other injury, initial encounter: Secondary | ICD-10-CM

## 2022-04-18 DIAGNOSIS — R0902 Hypoxemia: Secondary | ICD-10-CM

## 2022-04-18 DIAGNOSIS — R634 Abnormal weight loss: Secondary | ICD-10-CM

## 2022-04-18 DIAGNOSIS — M0579 Rheumatoid arthritis with rheumatoid factor of multiple sites without organ or systems involvement: Secondary | ICD-10-CM | POA: Diagnosis not present

## 2022-04-18 DIAGNOSIS — J9611 Chronic respiratory failure with hypoxia: Secondary | ICD-10-CM | POA: Diagnosis not present

## 2022-04-18 MED ORDER — DRONABINOL 2.5 MG PO CAPS: 2.5000 mg | ORAL_CAPSULE | Freq: Two times a day (BID) | ORAL | 3 refills | Status: DC

## 2022-04-18 NOTE — Telephone Encounter (Addendum)
  Received a fax from [facility] regarding Prior Authorization for [place] 2.5 MG.   Authorization has been DENIED due to We denied this request under Medicare Part D because: Your plan's Medicare Part D drug plan cannot cover  drugs used for loss of appetite (anorexia), weight loss, or weight gain.          Patient Advocate Encounter   Received notification that prior authorization for MARINOL 2.5 MG  is required/requested.     PA submitted on 04/18/2022 via CoverMyMeds Key [id number]  Status is being determine  [facility] [place] Patient Advocate

## 2022-04-18 NOTE — Patient Instructions (Signed)
Please schedule follow up scheduled with APP in 3 months.  [place] for virtual if desired.  Before your next visit I would like you to have: Modified barium swallow - we will call you to schedule.  We can try marinol for appetite stimulation 1 pill twice a day.  Stop remeron.   Keep up the good work with pulmonary rehab.

## 2022-04-18 NOTE — Progress Notes (Signed)
Earl Thomas    [id number]    10-21-1950  Primary Care Physician:Sagardia, Ines Bloomer, MD Date of Appointment: 04/18/2022 Established Patient Visit  Chief complaint:   Chief Complaint  Patient presents with   Follow-up    Swallowing difficulty x2 weeks.  Has some coughing with eating.  Vomits with coughing too long/hard.    HPI: Earl Thomas is a 71 y.o. gentleman with history of RA-ILD. Intolerant of Ofev due to diarrhea. Had progression on  esbriet as well as weight loss and diarrhea.. CT scan showed progression. Discussed with Dr. Trudie Thomas and we added rituximab in addition to methotrexate.   Interval Updates: Here for follow up. Has seen [facility] and is undergoing evaluation. Currently trying to gain weight and do pulmonary rehab based on their recommendations to improve his candidacy.   Tried mirtazipine for weight gain - not sure if it's  Still taking methotrexate for RA. Joints well controlled.  Having trouble with swallowing and coughing and having post-tussive emesis. These episodes are usually associated with eating. Usually with solid food, liquids are going down ok.    I have reviewed the patient's family social and past medical history and updated as appropriate.   Past Medical History:  Diagnosis Date   Abnormal weight loss 12/04/2021   Acute interstitial pneumonitis ([place]) 12/04/2021   Aortic atherosclerosis ([place]) 12/04/2021   Arthralgia of temporomandibular joint 12/04/2021   Arthritis    BMI 26.0-26.9,adult 10/22/2016   Chronic fatigue syndrome 12/04/2021   Dyslipidemia 03/10/2012   Family history of prostate cancer 12/04/2021   Glucose intolerance (impaired glucose tolerance)    Hyperlipidemia    Interstitial lung disease ([place]) 07/16/2019   Long-term current use of high risk medication other than anticoagulant 07/16/2019   Other long term (current) drug therapy 12/04/2021   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   Skin lesion of chest wall 12/04/2021    Past Surgical History:  Procedure Laterality Date   ROTATOR CUFF REPAIR Right 01/02/16    Family History  Problem Relation Age of Onset   Cancer Father 50       prostate cancer   Diabetes Father    Heart disease Father 58       AMI late 96s   Cancer Mother 3       Breast cancer   Heart disease Mother        CABG at age 68   Diabetes Brother    Heart disease Brother        AMI x 2; CABG   Hyperlipidemia Brother     Social History   Occupational History   Occupation: Professor  Tobacco Use   Smoking status: Former    Types: Pipe    Quit date: 10/06/1988    Years since quitting: 33.5    Passive exposure: Never   Smokeless tobacco: Never   Tobacco comments:    parent smoked in home as a child.  Smoked a pip in college, not daily.  Vaping Use   Vaping Use: Never used  Substance and Sexual Activity   Alcohol use: No   Drug use: No   Sexual activity: Yes    Physical Exam: Blood pressure 100/70, pulse (!) 110, temperature 98 F (36.7 C), temperature source Oral, height _0  (1.651 m), weight  103 lb 9.6 oz (47 kg), SpO2 92 %.  Gen:     Thin, appears o Lungs:    bibasilar crackles, resting tachypnea CV:     resting tachycardia MSK:     bilateral MCP and PCP joints are swollen, tender Ext: clubbing, no edema  Data Reviewed: Imaging: I have personally reviewed the CT scan from Nov 2020 which demonstrates bibasilar honeycombing consistent with UIP pattern. On the 2023 feb scan there is progression of disease.   Echocardiogram 1. Left ventricular ejection fraction, by estimation, is 60 to 65%. The  left ventricle has normal function. The left ventricle has no regional  wall motion abnormalities. Left ventricular diastolic parameters are  consistent with Grade  I diastolic  dysfunction (impaired relaxation).   2. Right ventricular systolic function is normal. The right ventricular  size is normal. There is normal pulmonary artery systolic pressure.   3. The mitral valve is normal in structure. Mild mitral valve  regurgitation. No evidence of mitral stenosis.   4. The aortic valve is normal in structure. Aortic valve regurgitation is  not visualized. No aortic stenosis is present.   5. The inferior vena cava is normal in size with greater than 50%  respiratory variability, suggesting right atrial pressure of 3 mmHg.   PFTs:      Latest Ref Rng & Units 10/29/2021    3:00 PM 04/18/2021    3:05 PM 11/13/2020    3:07 PM 08/07/2020   10:04 AM 07/26/2019   10:56 AM  PFT Results  FVC-Pre L 1.22  1.71  1.71  1.37  1.52   FVC-Predicted Pre % 38  54  54  43  47   FVC-Post L     1.82   FVC-Predicted Post %     57   Pre FEV1/FVC % % 99  94  92  100  100   Post FEV1/FCV % %     88   FEV1-Pre L 1.21  1.61  1.57  1.37  1.52   FEV1-Predicted Pre % 50  67  65  57  63   FEV1-Post L     1.61   DLCO uncorrected ml/min/mmHg 8.80  10.08  11.16  9.68  11.79   DLCO UNC% % 39  45  49  42  52   DLCO corrected ml/min/mmHg 8.80   11.16  9.68    DLCO COR %Predicted % 39   49  42    DLVA Predicted % 55  71  104  73  82   TLC L     3.42   TLC % Predicted %     57   RV % Predicted %     84    I have personally reviewed the patient's PFTs and which show moderate restriction with an FVC of 54% which is stable from previous PFTs.    Labs: I have reviewed his outside hospital labs noted on March 23 2019 progress note, his CCP level was greater than 255, Rheumatoid factor was 149 his, ESR was 109 his uric acid was normal. LFTs reviewed 03/29/21 shows normal liver function.   Immunization status: Immunization History  Administered Date(s) Administered   Fluad Quad(high Dose 65+) 04/13/2020, 04/18/2021, 03/11/2022   Hep A / Hep B 02/12/2022   Influenza, High Dose  Seasonal PF 04/24/2019   Influenza,inj,Quad PF,6+ Mos 05/30/2015, 04/23/2016, 05/14/2017, 03/25/2018   Influenza-Unspecified 04/07/2014   PFIZER(Purple Top)SARS-COV-2 Vaccination 07/30/2019, 08/20/2019, 04/27/2020   PNEUMOCOCCAL CONJUGATE-20  12/25/2020   Pfizer Covid-19 Vaccine Bivalent Booster 49yr & up 04/24/2021   Pneumococcal Conjugate-13 05/18/2018   Pneumococcal Polysaccharide-23 06/09/2019   Td 03/10/2021   Tdap 02/13/2009, 05/14/2017   Zoster Recombinat (Shingrix) 05/27/2018, 01/19/2019   Zoster, Live 04/12/2014    Assessment:  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  Chronic Rhinitis - improved control. Drug monitoring for high risk medication - mtx Chronic respiratory failure on 2LNC with inogen Dysphagia, coughing with eating.  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  Continue mtx and meloxicam for RA.  I have stopped mirtazipine since no improvement, can try marinol for appetite stimulation.  [facility]  Continue oxygen with pulmonary rehab.  Having some dysphagia - will obtain MBS to r/o aspiration as this may affect transplantation candidacy.    I spent 30 minutes in the care of this patient today including pre-charting, chart review, review of results, face-to-face care, coordination of care and communication with consultants etc.).    Return to Care: Return in about 3 months (around 07/19/2022).   [name] MD Pulmonary and [place]

## 2022-04-18 NOTE — Progress Notes (Signed)
Daily Session Note  Patient Details  Name: Earl Thomas MRN: [id number] Date of Birth: 12/29/1950 Referring Provider:   [name] Pulmonary Rehab Walk Test from 03/01/2022 in [place]  Referring Provider [name]       Encounter Date: 04/18/2022  Check In:  Session Check In - 04/18/22 1415       Check-In   Supervising physician immediately available to respond to emergencies [facility] - Physician supervision    Physician(s) Erskine Emery    [facility]    Staff Present Rodney Langton, Cathleen Fears, MS, ACSM-CEP, Exercise Physiologist;Randi Olen Cordial BS, ACSM-CEP, Exercise Physiologist;David Lilyan Punt, MS, ACSM-CEP, CCRP, Exercise Physiologist;Samantha [place], RD, LDN    Virtual Visit No    Medication changes reported     No    Fall or balance concerns reported    No    Tobacco Cessation No Change    Warm-up and Cool-down Performed as group-led instruction    Resistance Training Performed Yes    VAD Patient? No    PAD/SET Patient? No      Pain Assessment   Currently in Pain? No/denies    Multiple Pain Sites No             Capillary Blood Glucose: No results found for this or any previous visit (from the past 24 hour(s)).    Social History   Tobacco Use  Smoking Status Former   Types: Pipe   Quit date: 10/06/1988   Years since quitting: 33.5   Passive exposure: Never  Smokeless Tobacco Never  Tobacco Comments   parent smoked in home as a child.  Smoked a pip in college, not daily.    Goals Met:  Proper associated with RPD/PD & O2 Sat Exercise tolerated well No report of concerns or symptoms today Strength training completed today  Goals Unmet:  Not Applicable  Comments: Service time is from 1315 to 1443.    Dr. Rodman Pickle is Medical Director for Pulmonary Rehab at [facility].

## 2022-04-22 ENCOUNTER — Telehealth: Payer: Self-pay | Admitting: Internal Medicine

## 2022-04-22 NOTE — Telephone Encounter (Signed)
Patient called and states that she needs a prior auth for her medication Marinol 2.31m  Please advise ladies

## 2022-04-22 NOTE — Telephone Encounter (Signed)
Dr Shearon Stalls,  Please note message from patient insurance on a PA for medication that you sent in last week  Received a fax from [facility] regarding Prior Authorization for [place] 2.5 MG.    Authorization has been DENIED due to We denied this request under Medicare Part D because: Your plan's Medicare Part D drug plan cannot cover  drugs used for loss of appetite (anorexia), weight loss, or weight gain.     please advise

## 2022-04-22 NOTE — Telephone Encounter (Signed)
Received a fax from [facility] regarding Prior Authorization for [place] 2.5 MG.    Authorization has been DENIED due to We denied this request under Medicare Part D because: Your plan's Medicare Part D drug plan cannot cover  drugs used for loss of appetite (anorexia), weight loss, or weight gain.                Patient Advocate Encounter   Received notification that prior authorization for MARINOL 2.5 MG  is required/requested.       PA submitted on 04/18/2022 via CoverMyMeds Key [id number]  Status is being determine   [place] Patient Advocate

## 2022-04-23 ENCOUNTER — Encounter (HOSPITAL_COMMUNITY)
Admission: RE | Admit: 2022-04-23 | Discharge: 2022-04-23 | Disposition: A | Discharge status: Home / Self Care | Payer: Medicare Other | Source: Ambulatory Visit | Attending: Pulmonary Disease | Admitting: Pulmonary Disease

## 2022-04-23 ENCOUNTER — Encounter (HOSPITAL_COMMUNITY): Payer: Medicare Other

## 2022-04-23 VITALS — Wt 103.4 lb

## 2022-04-23 DIAGNOSIS — R0902 Hypoxemia: Secondary | ICD-10-CM | POA: Diagnosis not present

## 2022-04-23 NOTE — Progress Notes (Signed)
Daily Session Note  Patient Details  Name: Earl Thomas MRN: [id number] Date of Birth: [birth year] Referring Provider:   [name] Pulmonary Rehab Walk Test from 03/01/2022 in [place]  Referring Provider [name]       Encounter Date: 04/23/2022  Check In:  Session Check In - 04/23/22 1424       Check-In   Supervising physician immediately available to respond to emergencies [facility] - Physician supervision    Physician(s) Tacy Learn    [facility]    Staff Present Rodney Langton, Cathleen Fears, MS, ACSM-CEP, Exercise Physiologist; Olen Cordial BS, ACSM-CEP, Exercise Physiologist;David Lilyan Punt, MS, ACSM-CEP, CCRP, Exercise Physiologist;Samantha [place], RD, LDN    Virtual Visit No    Medication changes reported     No    Fall or balance concerns reported    No    Tobacco Cessation No Change    Warm-up and Cool-down Performed as group-led instruction    Resistance Training Performed Yes    VAD Patient? No    PAD/SET Patient? No      Pain Assessment   Currently in Pain? No/denies    Multiple Pain Sites No             Capillary Blood Glucose: No results found for this or any previous visit (from the past 24 hour(s)).   Exercise Prescription Changes - 04/23/22 1500       Response to Exercise   Blood Pressure (Admit) 108/64    Blood Pressure (Exercise) 120/70    Blood Pressure (Exit) 110/78    Heart Rate (Admit) 102 bpm    Heart Rate (Exercise) 108 bpm    Heart Rate (Exit) 105 bpm    Oxygen Saturation (Admit) 99 %    Oxygen Saturation (Exercise) 93 %    Oxygen Saturation (Exit) 95 %    Rating of Perceived Exertion (Exercise) 13    Perceived Dyspnea (Exercise) 1    Duration Continue with 30 min of aerobic exercise without signs/symptoms of physical distress.    Intensity THRR unchanged      Progression   Progression Continue to progress workloads to maintain intensity without signs/symptoms of  physical distress.      Resistance Training   Training Prescription Yes    Weight red bands    Reps 10-15    Time 10 Minutes      Oxygen   Oxygen Continuous    Liters 2      NuStep   Level 3    SPM 60    Minutes 15    METs 1.4      Track   Laps 7    Minutes 15    METs 1.81      Oxygen   Maintain Oxygen Saturation 88% or higher             Social History   Tobacco Use  Smoking Status Former   Types: Pipe   Quit date: 10/06/1988   Years since quitting: 33.5   Passive exposure: Never  Smokeless Tobacco Never  Tobacco Comments   parent smoked in home as a child.  Smoked a pip in college, not daily.    Goals Met:  Independence with exercise equipment Exercise tolerated well No report of concerns or symptoms today Strength training completed today  Goals Unmet:  Not Applicable  Comments: Service time is from 1322 to 1445.    Dr. Rodman Pickle is Medical Director for Pulmonary  Rehab at [facility].

## 2022-04-25 ENCOUNTER — Other Ambulatory Visit (HOSPITAL_COMMUNITY): Payer: Self-pay

## 2022-04-25 ENCOUNTER — Encounter (HOSPITAL_COMMUNITY): Payer: Medicare Other

## 2022-04-25 ENCOUNTER — Encounter (HOSPITAL_COMMUNITY)
Admission: RE | Admit: 2022-04-25 | Discharge: 2022-04-25 | Disposition: A | Discharge status: Home / Self Care | Payer: Medicare Other | Source: Ambulatory Visit | Attending: Pulmonary Disease | Admitting: Pulmonary Disease

## 2022-04-25 DIAGNOSIS — R0902 Hypoxemia: Secondary | ICD-10-CM

## 2022-04-25 NOTE — Telephone Encounter (Signed)
Is there a way to do a peer to peer for this?

## 2022-04-25 NOTE — Progress Notes (Signed)
Daily Session Note  Patient Details  Name: Earl Thomas MRN: [id number] Date of Birth: April 08, 1951 Referring Provider:   [name] Pulmonary Rehab Walk Test from 03/01/2022 in [place]  Referring Provider [name]       Encounter Date: 04/25/2022  Check In:  Session Check In - 04/25/22 1339       Check-In   Supervising physician immediately available to respond to emergencies [facility] - Physician supervision    Physician(s) Tacy Learn    [facility]    Staff Present Rodney Langton, Cathleen Fears, MS, ACSM-CEP, Exercise Physiologist; Olen Cordial BS, ACSM-CEP, Exercise Physiologist;Samantha [place], RD, LDN    Virtual Visit No    Medication changes reported     No    Fall or balance concerns reported    No    Tobacco Cessation No Change    Warm-up and Cool-down Performed as group-led instruction    Resistance Training Performed Yes    VAD Patient? No    PAD/SET Patient? No      Pain Assessment   Currently in Pain? No/denies    Multiple Pain Sites No             Capillary Blood Glucose: No results found for this or any previous visit (from the past 24 hour(s)).    Social History   Tobacco Use  Smoking Status Former   Types: Pipe   Quit date: 10/06/1988   Years since quitting: 33.5   Passive exposure: Never  Smokeless Tobacco Never  Tobacco Comments   parent smoked in home as a child.  Smoked a pip in college, not daily.    Goals Met:  Independence with exercise equipment Exercise tolerated well No report of concerns or symptoms today Strength training completed today  Goals Unmet:  Not Applicable  Comments: Service time is from 1325 to 1456.    Dr. Rodman Pickle is Medical Director for Pulmonary Rehab at [facility].

## 2022-04-25 NOTE — Telephone Encounter (Signed)
Ran another test claim today and the dronabinol 2.5 went though as cover by [employer] insurance.   Please be advise

## 2022-04-26 ENCOUNTER — Ambulatory Visit (HOSPITAL_COMMUNITY)
Admission: RE | Admit: 2022-04-26 | Discharge: 2022-04-26 | Disposition: A | Discharge status: Home / Self Care | Payer: Medicare Other | Source: Ambulatory Visit | Attending: Internal Medicine | Admitting: Internal Medicine

## 2022-04-26 ENCOUNTER — Other Ambulatory Visit: Payer: Self-pay | Admitting: Internal Medicine

## 2022-04-26 DIAGNOSIS — R1310 Dysphagia, unspecified: Secondary | ICD-10-CM

## 2022-04-26 DIAGNOSIS — R051 Acute cough: Secondary | ICD-10-CM | POA: Insufficient documentation

## 2022-04-26 NOTE — Progress Notes (Signed)
Modified Barium Swallow Progress Note  Patient Details  Name: Earl Thomas MRN: [id number] Date of Birth: 03/25/1951  Today's Date: 04/26/2022  Modified Barium Swallow completed.  Full report located under Chart Review in the Imaging Section.  Brief recommendations include the following:  Clinical Impression  Pt demonstrated oropharyngeal abilities that are within normal range during today's MBS across trials of thin barium, puree, solid and barium pill with suspected esophageal involvement. Timely and controlled oral phase and adequate base of tongue retraction, hyolaryngeal elevation and anterior movement, pharyngeal contraction and epiglottic deflection were normal to prevent aspiration; trace, flash laryngeal penetration with thin (considered within normal range). There was no significant residue. Barium pill did hesitate in pt's esophagus before transitioning with additional sip barium which may be impacting his esophageal symptoms. Recommend continue regular texture, thin liquids, and provided verbal education re: strategies to decrease esophageal symptoms.   Swallow Evaluation Recommendations       SLP Diet Recommendations: Regular solids;Thin liquid   Liquid Administration via: Cup;Straw   Medication Administration: Whole meds with liquid   Supervision: Patient able to self feed       Postural Changes: Seated upright at 90 degrees;Remain semi-upright after after feeds/meals (Comment)   Oral Care Recommendations: Oral care BID        Houston Siren 04/26/2022,1:40 PM

## 2022-04-30 ENCOUNTER — Encounter (HOSPITAL_COMMUNITY): Payer: Medicare Other

## 2022-04-30 ENCOUNTER — Ambulatory Visit (INDEPENDENT_AMBULATORY_CARE_PROVIDER_SITE_OTHER): Payer: Medicare Other | Admitting: Emergency Medicine

## 2022-04-30 ENCOUNTER — Encounter (HOSPITAL_COMMUNITY)
Admission: RE | Admit: 2022-04-30 | Discharge: 2022-04-30 | Disposition: A | Discharge status: Home / Self Care | Payer: Medicare Other | Source: Ambulatory Visit | Attending: Pulmonary Disease | Admitting: Pulmonary Disease

## 2022-04-30 ENCOUNTER — Encounter: Payer: Self-pay | Admitting: Emergency Medicine

## 2022-04-30 VITALS — BP 118/74 | HR 104 | Temp 97.6°F | Ht 65.0 in | Wt 102.0 lb

## 2022-04-30 DIAGNOSIS — I700 Atherosclerosis of aorta: Secondary | ICD-10-CM | POA: Diagnosis not present

## 2022-04-30 DIAGNOSIS — K219 Gastro-esophageal reflux disease without esophagitis: Secondary | ICD-10-CM

## 2022-04-30 DIAGNOSIS — E785 Hyperlipidemia, unspecified: Secondary | ICD-10-CM

## 2022-04-30 DIAGNOSIS — R0902 Hypoxemia: Secondary | ICD-10-CM | POA: Diagnosis not present

## 2022-04-30 DIAGNOSIS — N289 Disorder of kidney and ureter, unspecified: Secondary | ICD-10-CM

## 2022-04-30 DIAGNOSIS — Z79899 Other long term (current) drug therapy: Secondary | ICD-10-CM

## 2022-04-30 DIAGNOSIS — J849 Interstitial pulmonary disease, unspecified: Secondary | ICD-10-CM

## 2022-04-30 DIAGNOSIS — M0510 Rheumatoid lung disease with rheumatoid arthritis of unspecified site: Secondary | ICD-10-CM | POA: Diagnosis not present

## 2022-04-30 DIAGNOSIS — I5032 Chronic diastolic (congestive) heart failure: Secondary | ICD-10-CM

## 2022-04-30 MED ORDER — ATORVASTATIN CALCIUM 10 MG PO TABS: 10.0000 mg | ORAL_TABLET | Freq: Every day | ORAL | 3 refills | Status: DC

## 2022-04-30 MED ORDER — FAMOTIDINE 20 MG PO TABS: 20.0000 mg | ORAL_TABLET | Freq: Two times a day (BID) | ORAL | 3 refills | Status: DC

## 2022-04-30 NOTE — Assessment & Plan Note (Signed)
Stable.  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

## 2022-04-30 NOTE — Assessment & Plan Note (Signed)
Continue atorvastatin 10 mg daily.

## 2022-04-30 NOTE — Patient Instructions (Signed)
Health Maintenance After Age 69 After age 69, you are at a higher risk for certain long-term diseases and infections as well as injuries from falls. Falls are a major cause of broken bones and head injuries in people who are older than age 69. Getting regular preventive care can help to keep you healthy and well. Preventive care includes getting regular testing and making lifestyle changes as recommended by your health care provider. Talk with your health care provider about: Which screenings and tests you should have. A screening is a test that checks for a disease when you have no symptoms. A diet and exercise plan that is right for you. What should I know about screenings and tests to prevent falls? Screening and testing are the best ways to find a health problem early. Early diagnosis and treatment give you the best chance of managing medical conditions that are common after age 69. Certain conditions and lifestyle choices may make you more likely to have a fall. Your health care provider may recommend: Regular vision checks. Poor vision and conditions such as cataracts can make you more likely to have a fall. If you wear glasses, make sure to get your prescription updated if your vision changes. Medicine review. Work with your health care provider to regularly review all of the medicines you are taking, including over-the-counter medicines. Ask your health care provider about any side effects that may make you more likely to have a fall. Tell your health care provider if any medicines that you take make you feel dizzy or sleepy. Strength and balance checks. Your health care provider may recommend certain tests to check your strength and balance while standing, walking, or changing positions. Foot health exam. Foot pain and numbness, as well as not wearing proper footwear, can make you more likely to have a fall. Screenings, including: Osteoporosis screening. Osteoporosis is a condition that causes  the bones to get weaker and break more easily. Blood pressure screening. Blood pressure changes and medicines to control blood pressure can make you feel dizzy. Depression screening. You may be more likely to have a fall if you have a fear of falling, feel depressed, or feel unable to do activities that you used to do. Alcohol use screening. Using too much alcohol can affect your balance and may make you more likely to have a fall. Follow these instructions at home: Lifestyle Do not drink alcohol if: Your health care provider tells you not to drink. If you drink alcohol: Limit how much you have to: 0-1 drink a day for women. 0-2 drinks a day for men. Know how much alcohol is in your drink. In the [place] one drink equals one 12 oz bottle of beer (355 mL), one 5 oz glass of wine (148 mL), or one 1 oz glass of hard liquor (44 mL). Do not use any products that contain nicotine or tobacco. These products include cigarettes, chewing tobacco, and vaping devices, such as e-cigarettes. If you need help quitting, ask your health care provider. Activity  Follow a regular exercise program to stay fit. This will help you maintain your balance. Ask your health care provider what types of exercise are appropriate for you. If you need a cane or walker, use it as recommended by your health care provider. Wear supportive shoes that have nonskid soles. Safety  Remove any tripping hazards, such as rugs, cords, and clutter. Install safety equipment such as grab bars in bathrooms and safety rails on stairs. Keep rooms and walkways   well-lit. General instructions Talk with your health care provider about your risks for falling. Tell your health care provider if: You fall. Be sure to tell your health care provider about all falls, even ones that seem minor. You feel dizzy, tiredness (fatigue), or off-balance. Take over-the-counter and prescription medicines only as told by your health care provider. These include  supplements. Eat a healthy diet and maintain a healthy weight. A healthy diet includes low-fat dairy products, low-fat (lean) meats, and fiber from whole grains, beans, and lots of fruits and vegetables. Stay current with your vaccines. Schedule regular health, dental, and eye exams. Summary Having a healthy lifestyle and getting preventive care can help to protect your health and wellness after age 69. Screening and testing are the best way to find a health problem early and help you avoid having a fall. Early diagnosis and treatment give you the best chance for managing medical conditions that are more common for people who are older than age 69. Falls are a major cause of broken bones and head injuries in people who are older than age 69. Take precautions to prevent a fall at home. Work with your health care provider to learn what changes you can make to improve your health and wellness and to prevent falls. This information is not intended to replace advice given to you by your health care provider. Make sure you discuss any questions you have with your health care provider. Document Revised: 11/13/2020 Document Reviewed: 11/13/2020 Elsevier Patient Education  2023 [employer]  

## 2022-04-30 NOTE — Progress Notes (Signed)
Daily Session Note  Patient Details  Name: Earl Thomas MRN: [id number] Date of Birth: 10/09/1950 Referring Provider:   Flowsheet Row Pulmonary Rehab Walk Test from 03/01/2022 in [facility]  Referring Provider Fried  [Ellison]       Encounter Date: 04/30/2022  Check In:  Session Check In - 04/30/22 1428       Check-In   Supervising physician immediately available to respond to emergencies [facility] - Physician supervision    Physician(s) Olalare    Location [facility]    Staff Present  , RN;Kaylee Davis, MS, ACSM-CEP, Exercise Physiologist;Randi Reeve BS, ACSM-CEP, Exercise Physiologist;Samantha Spain, RD, LDN    Virtual Visit No    Medication changes reported     No    Fall or balance concerns reported    No    Tobacco Cessation No Change    Warm-up and Cool-down Performed as group-led instruction    Resistance Training Performed Yes    VAD Patient? No    PAD/SET Patient? No      Pain Assessment   Currently in Pain? No/denies    Multiple Pain Sites No             Capillary Blood Glucose: No results found for this or any previous visit (from the past 24 hour(s)).    Social History   Tobacco Use  Smoking Status Former   Types: Pipe   Quit date: 10/06/1988   Years since quitting: 33.5   Passive exposure: Never  Smokeless Tobacco Never  Tobacco Comments   parent smoked in home as a child.  Smoked a pip in college, not daily.    Goals Met:  Proper associated with RPD/PD & O2 Sat Exercise tolerated well No report of concerns or symptoms today Strength training completed today  Goals Unmet:  Not Applicable  Comments: Service time is from 1320 to 1440   Dr. Jane Ellison is Medical Director for Pulmonary Rehab at  Hospital.  

## 2022-04-30 NOTE — Assessment & Plan Note (Signed)
Stable.  Euvolemic. No signs of congestive heart failure

## 2022-04-30 NOTE — Progress Notes (Signed)
Earl Thomas 71 y.o.   Chief Complaint  Patient presents with   Follow-up    3 mnth f/u appt, no concerns , patient states he is suppose to be taking atorvastatin , not on med list     HISTORY OF PRESENT ILLNESS: This is a 71 y.o. male here for 59-monthfollow-up of chronic medical conditions Chronic pulmonary condition oxygen dependent Overall doing better.  No new complaints or medical concerns today. 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  Chronic Rhinitis - improved control. Drug monitoring for high risk medication - mtx Chronic respiratory failure on 2LNC with inogen Dysphagia, coughing with eating.   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  Continue mtx and meloxicam for RA.  I have stopped mirtazipine since no improvement, can try marinol for appetite stimulation.  [facility]  Continue oxygen with pulmonary rehab.  Having some dysphagia - will obtain MBS to r/o aspiration as this may affect transplantation candidacy.      I spent 30 minutes in the care of this patient today including pre-charting, chart review, review of results, face-to-face care, coordination of care and communication with consultants etc.).       Return to Care: Return in about 3 months (around 07/19/2022).     [name] MD Pulmonary and [facility] HPI   Prior to Admission medications   Medication Sig Start Date End Date Taking? Authorizing Provider  acetaminophen (TYLENOL) 500 MG tablet Take 1,000 mg by mouth every 6 (six) hours as needed for headache (pain).   Yes [provider]  albuterol (VENTOLIN HFA) 108 (90 Base) MCG/ACT inhaler Inhale 2 puffs into the lungs every 6 (six) hours as needed for wheezing or shortness of breath. 07/19/21  Yes DSpero [name]  aspirin EC 81 MG tablet Take 81 mg by mouth every morning.   Yes [provider]  azelastine (ASTELIN) 0.1 % nasal spray Place 2 sprays into both nostrils 2 (two) times daily. Use in each nostril as directed Patient taking differently: Place 2 sprays into both nostrils 2 (two) times daily as needed for rhinitis or allergies (congestion). Use in each nostril as directed 12/25/21  Yes [name]  Brimonidine Tartrate (LUMIFY) 0.025 % SOLN Place 1 drop into both eyes every Sunday.   Yes [provider]  cetirizine (ZYRTEC) 10 MG tablet Take 10 mg by mouth daily.   Yes [provider]  diclofenac sodium (VOLTAREN) 1 % GEL Apply 2 g topically 4 (four) times daily. Patient taking differently: Apply 2 g topically 4 (four) times daily as needed (pain). 09/18/18  Yes SForrest [name]  dronabinol (MARINOL) 2.5 MG capsule Take 1 capsule (2.5 mg total) by mouth 2 (two) times daily before a meal. 04/18/22  Yes DSpero [name]  folic acid ([name]) 1 MG tablet Take 1 mg by mouth every morning. 08/27/19  Yes [provider]  guaifenesin (ROBITUSSIN) 100 MG/5ML syrup Take 200 mg by mouth 3 (three) times daily as needed for cough or congestion.   Yes [provider]  levocetirizine (XYZAL) 5 MG tablet Take 1 tablet (5 mg total) by mouth every evening.  12/25/21  Yes Roney Marion, MD  loperamide (IMODIUM) 2 MG capsule Take 2 mg by mouth daily as needed for diarrhea or loose stools.   Yes [provider]  meloxicam (MOBIC) 7.5 MG tablet Take 1 tablet (7.5 mg total) by mouth daily as needed for pain. 01/16/22  Yes Spero Geralds, MD  methotrexate (RHEUMATREX) 10 MG tablet Take 1 tablet (10 mg total) by mouth once a week. Caution: Chemotherapy. Protect  from light. 02/11/22  Yes Cobb, [name], NP  mirtazapine (REMERON) 15 MG tablet Take 0.5 tablets (7.5 mg total) by mouth at bedtime. 02/07/22  Yes Spero Geralds, MD  Naphazoline HCl (CLEAR EYES OP) Place 1 drop into both eyes daily.   Yes [provider]  predniSONE (DELTASONE) 10 MG tablet Take 6 tabs (76m) by mouth daily for 1 week, followed by 5 tabs daily for 1 week, then 4 tabs daily for 1 week, then 3 tabs daily, Pulmonary physician to titrate. 01/21/22  Yes SThurnell Lose MD  riTUXimab (RITUXAN IV) Inject into the vein.   Yes [provider]  sulfamethoxazole-trimethoprim (BACTRIM DS) 800-160 MG tablet Take 1 pill on Monday, Wednesday, and Friday of each week 01/21/22  Yes SThurnell Lose MD  SYMBICORT 80-4.5 MCG/ACT inhaler Inhale 2 puffs by mouth twice daily Patient taking differently: Inhale 2 puffs into the lungs 2 (two) times daily as needed (shortness of breath/wheezing). 11/28/21  Yes [name], [name]  vitamin C (ASCORBIC ACID) 500 MG tablet Take 500 mg by mouth daily after supper.   Yes [provider]  atorvastatin (LIPITOR) 10 MG tablet Take 1 tablet (10 mg total) by mouth daily. 04/30/22   [name]  famotidine (PEPCID) 20 MG tablet Take 1 tablet (20 mg total) by mouth 2 (two) times daily. 04/30/22   [name]  nitroGLYCERIN (NITROSTAT) 0.4 MG SL tablet Place 1 tablet (0.4 mg total) under the tongue every 5 (five) minutes as needed. Patient taking differently: Place 0.4 mg under the tongue every 5 (five) minutes as needed for chest pain. 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   Arthralgia of temporomandibular joint 12/04/2021   Abnormal weight loss 12/04/2021   Family history of prostate cancer 12/04/2021   Aortic atherosclerosis ([facility] 12/04/2021   Skin lesion of chest wall 12/04/2021   Interstitial lung disease ([facility] 07/16/2019   Rheumatoid lung disease with rheumatoid arthritis ([place] 07/16/2019   Long-term current use of high risk medication other than anticoagulant 07/16/2019   BMI 26.0-26.9,adult 10/22/2016   Renal insufficiency 04/29/2016   Glucose intolerance (impaired glucose tolerance) 04/29/2016   Dyslipidemia 03/10/2012   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    Past Medical History:  Diagnosis Date   Abnormal weight loss 12/04/2021   Acute interstitial pneumonitis ([facility] 12/04/2021   Aortic atherosclerosis ([facility] 12/04/2021   Arthralgia of temporomandibular joint 12/04/2021   Arthritis    BMI 26.0-26.9,adult 10/22/2016   Chronic fatigue syndrome 12/04/2021   Dyslipidemia 03/10/2012   Family history of prostate cancer 12/04/2021   Glucose intolerance (impaired glucose tolerance)    Hyperlipidemia    Interstitial  lung disease ([place]) 07/16/2019   Long-term current use of high risk medication other than anticoagulant 07/16/2019   Other long term (current) drug therapy 12/04/2021   Parietoalveolar pneumopathy ([place]) 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   Renal insufficiency 04/29/2016   Rheumatoid lung disease with rheumatoid arthritis ([place]) 07/16/2019   Skin lesion of chest wall 12/04/2021    Past Surgical History:  Procedure Laterality Date   ROTATOR CUFF REPAIR Right 01/02/16    Social History   Socioeconomic History   Marital status: Married    Spouse name: Not on file   Number of children: 2   Years of education: Not on file   Highest education level: Not on file  Occupational History   Occupation: Professor  Tobacco Use   Smoking status:  Former    Types: Pipe    Quit date: 10/06/1988    Years since quitting: 33.5    Passive exposure: Never   Smokeless tobacco: Never   Tobacco comments:    parent smoked in home as a child.  Smoked a pip in college, not daily.  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      Tobacco: pipe in 1980s      Alcohol: none      Exercise: joined [employer]; going 2-3 times per week.        Seatbelt:  100%   Social Determinants of Health   Financial Resource Strain: Low Risk  (11/19/2021)   Overall Financial Resource Strain (CARDIA)    Difficulty of Paying Living Expenses: Not hard at all  Food Insecurity: No Food Insecurity (11/19/2021)   Hunger Vital Sign    Worried About Running Out of Food in the Last Year: Never true    Ran Out of Food in the Last Year: Never true  Transportation Needs: No Transportation Needs (11/19/2021)   PRAPARE - Hydrologist (Medical): No    Lack of Transportation (Non-Medical): No  Physical Activity: Not on file  Stress: No Stress Concern Present (11/19/2021)   [place]    Feeling of Stress : Not at all  Social Connections: [place] (11/19/2021)   Social Connection and Isolation Panel [NHANES]    Frequency of Communication with Friends and Family: More than three times a week    Frequency of Social Gatherings with Friends and Family: More than three times a week    Attends Religious Services: More than 4 times per year    Active  Member of [employer] or Organizations: Yes    Attends Archivist Meetings: More than 4 times per year    Marital Status: Married  Human resources officer Violence: Not At Risk (11/19/2021)   Humiliation, Afraid, Rape, and Kick questionnaire    Fear of Current or Ex-Partner: No    Emotionally Abused: No    Physically Abused: No    Sexually Abused: No  Family History  Problem Relation Age of Onset   Cancer Father 40       prostate cancer   Diabetes Father    Heart disease Father 35       AMI late 17s   Cancer Mother 34       Breast cancer   Heart disease Mother        CABG at age 66   Diabetes Brother    Heart disease Brother        AMI x 2; CABG   Hyperlipidemia Brother      Review of Systems  Constitutional: Negative.  Negative for chills and fever.  HENT: Negative.  Negative for congestion and sore throat.   Respiratory:  Negative for hemoptysis and shortness of breath.   Cardiovascular: Negative.  Negative for palpitations.  Gastrointestinal: Negative.  Negative for abdominal pain, diarrhea, nausea and vomiting.  Genitourinary: Negative.  Negative for dysuria and hematuria.  Skin: Negative.  Negative for rash.  Neurological: Negative.  Negative for dizziness and headaches.  All other systems reviewed and are negative.  Today's Vitals   04/30/22 1106  BP: 118/74  Pulse: (!) 104  Temp: 97.6 F (36.4 C)  TempSrc: Oral  SpO2: 97%  Weight: 102 lb (46.3 kg)  Height: 5' 5" (1.651 m)   Body mass index is 16.97 kg/m.   Physical Exam Vitals reviewed.  Constitutional:      Appearance: Normal appearance.  HENT:     Head: Normocephalic.     Mouth/Throat:     Mouth: Mucous membranes are moist.     Pharynx: Oropharynx is clear.  Eyes:     Extraocular Movements: Extraocular movements intact.     Pupils: Pupils are equal, round, and reactive to light.  Cardiovascular:     Rate and Rhythm: Normal rate and regular rhythm.     Pulses: Normal pulses.     Heart  sounds: Normal heart sounds.  Pulmonary:     Effort: Pulmonary effort is normal.     Breath sounds: Rales (Bilateral dry crackles) present.  Musculoskeletal:     Cervical back: No tenderness.  Lymphadenopathy:     Cervical: No cervical adenopathy.  Skin:    General: Skin is warm and dry.  Neurological:     General: No focal deficit present.     Mental Status: He is alert and oriented to person, place, and time.  Psychiatric:        Mood and Affect: Mood normal.        Behavior: Behavior normal.     ASSESSMENT & PLAN: A total of 43 minutes was spent with the patient and counseling/coordination of care regarding preparing for this visit, review of most recent office visit notes, review of most recent pulmonary office visit notes, review of multiple chronic medical problems and their management, review of all medications, education on nutrition, review of most recent blood work results, prognosis, documentation, and need for follow-up.  Problem List Items Addressed This Visit       Cardiovascular and Mediastinum   Aortic atherosclerosis (HCC)    Continue atorvastatin 10 mg daily.      Relevant Medications   atorvastatin (LIPITOR) 10 MG tablet   Chronic diastolic CHF (congestive heart failure) (HCC)    Stable.  Euvolemic. No signs of congestive heart failure      Relevant Medications   atorvastatin (LIPITOR) 10 MG tablet     Respiratory   Interstitial lung disease ([place]) - Primary  Stable.  Oxygen dependent. Continue rituximab and oxygen 2 L nasal cannula with inogen Has follow-up appointment with [facility] team [facility] next month      Rheumatoid lung disease with rheumatoid arthritis ([place])    Continue methotrexate and meloxicam        Digestive   GERD (gastroesophageal reflux disease)    Stable.  Takes Pepcid 20 mg as needed.      Relevant Medications   famotidine (PEPCID) 20 MG tablet     Genitourinary   Renal insufficiency (Chronic)    Advised to stay  well-hydrated        Other   Dyslipidemia   Relevant Medications   atorvastatin (LIPITOR) 10 MG tablet   Long-term current use of high risk medication other than anticoagulant   Patient Instructions  Health Maintenance After Age 52 After age 11, you are at a higher risk for certain long-term diseases and infections as well as injuries from falls. Falls are a major cause of broken bones and head injuries in people who are older than age 41. Getting regular preventive care can help to keep you healthy and well. Preventive care includes getting regular testing and making lifestyle changes as recommended by your health care provider. Talk with your health care provider about: Which screenings and tests you should have. A screening is a test that checks for a disease when you have no symptoms. A diet and exercise plan that is right for you. What should I know about screenings and tests to prevent falls? Screening and testing are the best ways to find a health problem early. Early diagnosis and treatment give you the best chance of managing medical conditions that are common after age 37. Certain conditions and lifestyle choices may make you more likely to have a fall. Your health care provider may recommend: Regular vision checks. Poor vision and conditions such as cataracts can make you more likely to have a fall. If you wear glasses, make sure to get your prescription updated if your vision changes. Medicine review. Work with your health care provider to regularly review all of the medicines you are taking, including over-the-counter medicines. Ask your health care provider about any side effects that may make you more likely to have a fall. Tell your health care provider if any medicines that you take make you feel dizzy or sleepy. Strength and balance checks. Your health care provider may recommend certain tests to check your strength and balance while standing, walking, or changing  positions. Foot health exam. Foot pain and numbness, as well as not wearing proper footwear, can make you more likely to have a fall. Screenings, including: Osteoporosis screening. Osteoporosis is a condition that causes the bones to get weaker and break more easily. Blood pressure screening. Blood pressure changes and medicines to control blood pressure can make you feel dizzy. Depression screening. You may be more likely to have a fall if you have a fear of falling, feel depressed, or feel unable to do activities that you used to do. Alcohol use screening. Using too much alcohol can affect your balance and may make you more likely to have a fall. Follow these instructions at home: Lifestyle Do not drink alcohol if: Your health care provider tells you not to drink. If you drink alcohol: Limit how much you have to: 0-1 drink a day for women. 0-2 drinks a day for men. Know how much alcohol is in your drink. In the [place] one drink equals one 12  oz bottle of beer (355 mL), one 5 oz glass of wine (148 mL), or one 1 oz glass of hard liquor (44 mL). Do not use any products that contain nicotine or tobacco. These products include cigarettes, chewing tobacco, and vaping devices, such as e-cigarettes. If you need help quitting, ask your health care provider. Activity  Follow a regular exercise program to stay fit. This will help you maintain your balance. Ask your health care provider what types of exercise are appropriate for you. If you need a cane or walker, use it as recommended by your health care provider. Wear supportive shoes that have nonskid soles. Safety  Remove any tripping hazards, such as rugs, cords, and clutter. Install safety equipment such as grab bars in bathrooms and safety rails on stairs. Keep rooms and walkways well-lit. General instructions Talk with your health care provider about your risks for falling. Tell your health care provider if: You fall. Be sure to tell your  health care provider about all falls, even ones that seem minor. You feel dizzy, tiredness (fatigue), or off-balance. Take over-the-counter and prescription medicines only as told by your health care provider. These include supplements. Eat a healthy diet and maintain a healthy weight. A healthy diet includes low-fat dairy products, low-fat (lean) meats, and fiber from whole grains, beans, and lots of fruits and vegetables. Stay current with your vaccines. Schedule regular health, dental, and eye exams. Summary Having a healthy lifestyle and getting preventive care can help to protect your health and wellness after age 67. Screening and testing are the best way to find a health problem early and help you avoid having a fall. Early diagnosis and treatment give you the best chance for managing medical conditions that are more common for people who are older than age 64. Falls are a major cause of broken bones and head injuries in people who are older than age 21. Take precautions to prevent a fall at home. Work with your health care provider to learn what changes you can make to improve your health and wellness and to prevent falls. This information is not intended to replace advice given to you by your health care provider. Make sure you discuss any questions you have with your health care provider. Document Revised: 11/13/2020 Document Reviewed: 11/13/2020 Elsevier Patient Education  [place], MD [facility]

## 2022-04-30 NOTE — Assessment & Plan Note (Signed)
Stable.  Takes Pepcid 20 mg as needed.

## 2022-04-30 NOTE — Assessment & Plan Note (Signed)
Advised to stay well-hydrated

## 2022-04-30 NOTE — Assessment & Plan Note (Signed)
Continue methotrexate and meloxicam

## 2022-05-01 NOTE — Progress Notes (Signed)
Pulmonary Individual Treatment Plan  Patient Details  Name: Earl Thomas MRN: [id number] Date of Birth: 03-May-1951 Referring Provider:   [name] Pulmonary Rehab Walk Test from 03/01/2022 in [place]  Referring Provider Maceo Pro  [[place]]       Initial Encounter Date:  Flowsheet Row Pulmonary Rehab Walk Test from 03/01/2022 in [place]  Date 03/01/22       Visit Diagnosis: Hypoxemia  Patient's Home Medications on Admission:   Current Outpatient Medications:    acetaminophen (TYLENOL) 500 MG tablet, Take 1,000 mg by mouth every 6 (six) hours as needed for headache (pain)., Disp: , Rfl:    albuterol (VENTOLIN HFA) 108 (90 Base) MCG/ACT inhaler, Inhale 2 puffs into the lungs every 6 (six) hours as needed for wheezing or shortness of breath., Disp: 18 g, Rfl: 5   aspirin EC 81 MG tablet, Take 81 mg by mouth every morning., Disp: , Rfl:    atorvastatin (LIPITOR) 10 MG tablet, Take 1 tablet (10 mg total) by mouth daily., Disp: 90 tablet, Rfl: 3   azelastine (ASTELIN) 0.1 % nasal spray, Place 2 sprays into both nostrils 2 (two) times daily. Use in each nostril as directed (Patient taking differently: Place 2 sprays into both nostrils 2 (two) times daily as needed for rhinitis or allergies (congestion). Use in each nostril as directed), Disp: 30 mL, Rfl: 12   Brimonidine Tartrate (LUMIFY) 0.025 % SOLN, Place 1 drop into both eyes every Sunday., Disp: , Rfl:    cetirizine (ZYRTEC) 10 MG tablet, Take 10 mg by mouth daily., Disp: , Rfl:    diclofenac sodium (VOLTAREN) 1 % GEL, Apply 2 g topically 4 (four) times daily. (Patient taking differently: Apply 2 g topically 4 (four) times daily as needed (pain).), Disp: 100 g, Rfl: 1   dronabinol (MARINOL) 2.5 MG capsule, Take 1 capsule (2.5 mg total) by mouth 2 (two) times daily before a meal., Disp: 60 capsule, Rfl: 3   famotidine (PEPCID) 20 MG tablet, Take 1 tablet (20 mg  total) by mouth 2 (two) times daily., Disp: 90 tablet, Rfl: 3   folic acid ([name]) 1 MG tablet, Take 1 mg by mouth every morning., Disp: , Rfl:    guaifenesin (ROBITUSSIN) 100 MG/5ML syrup, Take 200 mg by mouth 3 (three) times daily as needed for cough or congestion., Disp: , Rfl:    levocetirizine (XYZAL) 5 MG tablet, Take 1 tablet (5 mg total) by mouth every evening., Disp: 30 tablet, Rfl: 5   loperamide (IMODIUM) 2 MG capsule, Take 2 mg by mouth daily as needed for diarrhea or loose stools., Disp: , Rfl:    meloxicam (MOBIC) 7.5 MG tablet, Take 1 tablet (7.5 mg total) by mouth daily as needed for pain., Disp: 30 tablet, Rfl: 2   methotrexate (RHEUMATREX) 10 MG tablet, Take 1 tablet (10 mg total) by mouth once a week. Caution: Chemotherapy. Protect from light., Disp: 4 tablet, Rfl: 2   mirtazapine (REMERON) 15 MG tablet, Take 0.5 tablets (7.5 mg total) by mouth at bedtime., Disp: 30 tablet, Rfl: 5   Naphazoline HCl (CLEAR EYES OP), Place 1 drop into both eyes daily., Disp: , Rfl:    nitroGLYCERIN (NITROSTAT) 0.4 MG SL tablet, Place 1 tablet (0.4 mg total) under the tongue every 5 (five) minutes as needed. (Patient taking differently: Place 0.4 mg under the tongue every 5 (five) minutes as needed for chest pain.), Disp: 25 tablet, Rfl: 6  predniSONE (DELTASONE) 10 MG tablet, Take 6 tabs (33m) by mouth daily for 1 week, followed by 5 tabs daily for 1 week, then 4 tabs daily for 1 week, then 3 tabs daily, Pulmonary physician to titrate., Disp: 132 tablet, Rfl: 0   riTUXimab (RITUXAN IV), Inject into the vein., Disp: , Rfl:    sulfamethoxazole-trimethoprim (BACTRIM DS) 800-160 MG tablet, Take 1 pill on Monday, Wednesday, and Friday of each week, Disp: 15 tablet, Rfl: 0   SYMBICORT 80-4.5 MCG/ACT inhaler, Inhale 2 puffs by mouth twice daily (Patient taking differently: Inhale 2 puffs into the lungs 2 (two) times daily as needed (shortness of breath/wheezing).), Disp: 11 g, Rfl: 0   vitamin C (ASCORBIC  ACID) 500 MG tablet, Take 500 mg by mouth daily after supper., Disp: , Rfl:   Past Medical History: Past Medical History:  Diagnosis Date   Abnormal weight loss 12/04/2021   [place] 12/04/2021   Aortic atherosclerosis ([facility] 12/04/2021   Arthralgia of temporomandibular joint 12/04/2021   Arthritis    BMI 26.0-26.9,adult 10/22/2016   Chronic fatigue syndrome 12/04/2021   Dyslipidemia 03/10/2012   Family history of prostate cancer 12/04/2021   Glucose intolerance (impaired glucose tolerance)    Hyperlipidemia    [place]stitial lung disease ([facility] 07/16/2019   Long-term current use of high risk medication other than anticoagulant 07/16/2019   Other long term (current) drug therapy 12/04/2021   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   Renal insufficiency 04/29/2016   Rheumatoid lung disease with rheumatoid arthritis [place] [place] 07/16/2019   Skin lesion of chest wall 12/04/2021    Tobacco Use: Social History   Tobacco Use  Smoking Status Former   Types: Pipe   Quit date: 10/06/1988   Years since quitting: 33.5   Passive exposure: Never  Smokeless Tobacco Never  Tobacco Comments   parent smoked in home as a child.  Smoked a pip in college, not daily.    Labs: Review Flowsheet  More data exists      Latest Ref Rng & Units 05/14/2019 09/07/2019 03/13/2021 12/04/2021 01/19/2022  Labs for ITP Cardiac and Pulmonary Rehab  Cholestrol 0 - 200 mg/dL 246  108  155  119  -  LDL (calc) 0 - 99 mg/dL 193  65  100  72  -  HDL-C >39.00 mg/dL 39  31  45.00  34.40  -  Trlycerides 0.0 - 149.0 mg/dL 82  51  53.0  64.0  -  Hemoglobin A1c 4.0 - 5.6 % 6.2  5.9  - - -  Bicarbonate 20.0 - 28.0 mmol/L - - - - 28.9   O2 Saturation % - - - - 99.2     Capillary Blood Glucose: No results found for: "GLUCAP"   Pulmonary Assessment Scores:  Pulmonary Assessment Scores     Row Name 03/01/22 0954         ADL UCSD   ADL  Phase Entry     SOB Score total 29       CAT Score   CAT Score 22       mMRC Score   mMRC Score 3             UCSD: Self-administered rating of dyspnea associated with activities of daily living (ADLs) 6-point scale (0 = "not at all" to 5 = "maximal or unable to do because of breathlessness")  Scoring Scores range from 0 to 120.  Minimally important  difference is 5 units  CAT: CAT can identify the health impairment of COPD patients and is better correlated with disease progression.  CAT has a scoring range of zero to 40. The CAT score is classified into four groups of low (less than 10), medium (10 - 20), high (21-30) and very high (31-40) based on the impact level of disease on health status. A CAT score over 10 suggests significant symptoms.  A worsening CAT score could be explained by an exacerbation, poor medication adherence, poor inhaler technique, or progression of COPD or comorbid conditions.  CAT MCID is 2 points  mMRC: mMRC (Modified Medical Research Council) Dyspnea Scale is used to assess the degree of baseline functional disability in patients of respiratory disease due to dyspnea. No minimal important difference is established. A decrease in score of 1 point or greater is considered a positive change.   Pulmonary Function Assessment:  Pulmonary Function Assessment - 03/01/22 0954       Breath   Bilateral Breath Sounds Clear;Decreased    Shortness of Breath Yes;Limiting activity             Exercise Target Goals: Exercise Program Goal: Individual exercise prescription set using results from initial 6 min walk test and THRR while considering  patient's activity barriers and safety.   Exercise Prescription Goal: Initial exercise prescription builds to 30-45 minutes a day of aerobic activity, 2-3 days per week.  Home exercise guidelines will be given to patient during program as part of exercise prescription that the participant will acknowledge.  Activity  Barriers & Risk Stratification:  Activity Barriers & Cardiac Risk Stratification - 03/01/22 0955       Activity Barriers & Cardiac Risk Stratification   Activity Barriers Arthritis;Deconditioning;Muscular Weakness;Shortness of Breath             6 Minute Walk:  6 Minute Walk     Row Name 03/01/22 1054         6 Minute Walk   Phase Initial     Distance 1252 feet     Walk Time 6 minutes     # of Rest Breaks 0     MPH 2.37     METS 3.72     RPE 12     Perceived Dyspnea  2     VO2 Peak 13.02     Symptoms No     Resting HR 110 bpm     Resting BP 98/58     Resting Oxygen Saturation  97 %     Exercise Oxygen Saturation  during 6 min walk 90 %     Max Ex. HR 122 bpm     Max Ex. BP 132/60     2 Minute Post BP 100/50       Interval HR   1 Minute HR 117     2 Minute HR 119     3 Minute HR 120     4 Minute HR 120     5 Minute HR 119     6 Minute HR 122     2 Minute Post HR 101     Interval Heart Rate? Yes       Interval Oxygen   Interval Oxygen? Yes     Baseline Oxygen Saturation % 97 %     1 Minute Oxygen Saturation % 97 %     1 Minute Liters of Oxygen 2 L     2 Minute Oxygen Saturation % 96 %  2 Minute Liters of Oxygen 2 L     3 Minute Oxygen Saturation % 97 %     3 Minute Liters of Oxygen 2 L     4 Minute Oxygen Saturation % 93 %     4 Minute Liters of Oxygen 2 L     5 Minute Oxygen Saturation % 90 %     5 Minute Liters of Oxygen 2 L     6 Minute Oxygen Saturation % 93 %     6 Minute Liters of Oxygen 2 L     2 Minute Post Oxygen Saturation % 93 %     2 Minute Post Liters of Oxygen 2 L              Oxygen Initial Assessment:  Oxygen Initial Assessment - 03/06/22 0856       Home Oxygen   Home Oxygen Device Portable Concentrator;Home Concentrator    Sleep Oxygen Prescription Continuous    Liters per minute 2    Home Exercise Oxygen Prescription Continuous    Liters per minute 2    Home Resting Oxygen Prescription Continuous    Liters per  minute 2    Compliance with Home Oxygen Use Yes      Initial 6 min Walk   Oxygen Used Continuous    Liters per minute 2      Program Oxygen Prescription   Program Oxygen Prescription Continuous    Liters per minute 2      Intervention   Short Term Goals To learn and exhibit compliance with exercise, home and travel O2 prescription;To learn and understand importance of monitoring SPO2 with pulse oximeter and demonstrate accurate use of the pulse oximeter.;To learn and understand importance of maintaining oxygen saturations>88%;To learn and demonstrate proper pursed lip breathing techniques or other breathing techniques. ;To learn and demonstrate proper use of respiratory medications    Long  Term Goals Exhibits compliance with exercise, home  and travel O2 prescription;Verbalizes importance of monitoring SPO2 with pulse oximeter and return demonstration;Maintenance of O2 saturations>88%;Exhibits proper breathing techniques, such as pursed lip breathing or other method taught during program session;Compliance with respiratory medication;Demonstrates proper use of MDI's             Oxygen Re-Evaluation:  Oxygen Re-Evaluation     Row Name 03/06/22 0856 04/01/22 0914 04/25/22 0939         Program Oxygen Prescription   Program Oxygen Prescription -- Continuous Continuous     Liters per minute -- 2 2     Comments Pt has not began exercising in class yet -- --       Home Oxygen   Home Oxygen Device -- Portable Concentrator;Home Concentrator Portable Concentrator;Home Concentrator     Sleep Oxygen Prescription -- Continuous Continuous     Liters per minute -- 2 2     Home Exercise Oxygen Prescription -- Continuous Continuous     Liters per minute -- 2 2     Home Resting Oxygen Prescription -- Continuous Continuous     Liters per minute -- 2 2     Compliance with Home Oxygen Use -- Yes Yes       Goals/Expected Outcomes   Short Term Goals -- To learn and exhibit compliance with  exercise, home and travel O2 prescription;To learn and understand importance of monitoring SPO2 with pulse oximeter and demonstrate accurate use of the pulse oximeter.;To learn and understand importance of maintaining oxygen saturations>88%;To learn and demonstrate proper  pursed lip breathing techniques or other breathing techniques. ;To learn and demonstrate proper use of respiratory medications To learn and exhibit compliance with exercise, home and travel O2 prescription;To learn and understand importance of monitoring SPO2 with pulse oximeter and demonstrate accurate use of the pulse oximeter.;To learn and understand importance of maintaining oxygen saturations>88%;To learn and demonstrate proper pursed lip breathing techniques or other breathing techniques. ;To learn and demonstrate proper use of respiratory medications     Long  Term Goals -- Exhibits compliance with exercise, home  and travel O2 prescription;Verbalizes importance of monitoring SPO2 with pulse oximeter and return demonstration;Maintenance of O2 saturations>88%;Exhibits proper breathing techniques, such as pursed lip breathing or other method taught during program session;Compliance with respiratory medication;Demonstrates proper use of MDI's Exhibits compliance with exercise, home  and travel O2 prescription;Verbalizes importance of monitoring SPO2 with pulse oximeter and return demonstration;Maintenance of O2 saturations>88%;Exhibits proper breathing techniques, such as pursed lip breathing or other method taught during program session;Compliance with respiratory medication;Demonstrates proper use of MDI's     Comments -- No change, maintaining 2L No change, maintaining 2L     Goals/Expected Outcomes Compliance and understanding of oxygen saturation monitoring and breathing techniques to decreasae shortness of breath. Compliance and understanding of oxygen saturation monitoring and breathing techniques to decreasae shortness of breath.  Compliance and understanding of oxygen saturation monitoring and breathing techniques to decreasae shortness of breath.              Oxygen Discharge (Final Oxygen Re-Evaluation):  Oxygen Re-Evaluation - 04/25/22 0939       Program Oxygen Prescription   Program Oxygen Prescription Continuous    Liters per minute 2      Home Oxygen   Home Oxygen Device Portable Concentrator;Home Concentrator    Sleep Oxygen Prescription Continuous    Liters per minute 2    Home Exercise Oxygen Prescription Continuous    Liters per minute 2    Home Resting Oxygen Prescription Continuous    Liters per minute 2    Compliance with Home Oxygen Use Yes      Goals/Expected Outcomes   Short Term Goals To learn and exhibit compliance with exercise, home and travel O2 prescription;To learn and understand importance of monitoring SPO2 with pulse oximeter and demonstrate accurate use of the pulse oximeter.;To learn and understand importance of maintaining oxygen saturations>88%;To learn and demonstrate proper pursed lip breathing techniques or other breathing techniques. ;To learn and demonstrate proper use of respiratory medications    Long  Term Goals Exhibits compliance with exercise, home  and travel O2 prescription;Verbalizes importance of monitoring SPO2 with pulse oximeter and return demonstration;Maintenance of O2 saturations>88%;Exhibits proper breathing techniques, such as pursed lip breathing or other method taught during program session;Compliance with respiratory medication;Demonstrates proper use of MDI's    Comments No change, maintaining 2L    Goals/Expected Outcomes Compliance and understanding of oxygen saturation monitoring and breathing techniques to decreasae shortness of breath.             Initial Exercise Prescription:  Initial Exercise Prescription - 03/01/22 1100       Date of Initial Exercise RX and Referring Provider   Date 03/01/22    Referring Provider Earl Thomas    Expected Discharge Date 05/09/22      Oxygen   Oxygen Continuous    Liters 2    Maintain Oxygen Saturation 88% or higher      Recumbant Bike   Level 1  Watts 24    Minutes 15      NuStep   Level 1    SPM 60    Minutes 15      Prescription Details   Frequency (times per week) 2    Duration Progress to 30 minutes of continuous aerobic without signs/symptoms of physical distress      Intensity   THRR 40-80% of Max Heartrate 61-122    Ratings of Perceived Exertion 11-13    Perceived Dyspnea 0-4      Progression   Progression Continue to progress workloads to maintain intensity without signs/symptoms of physical distress.      Resistance Training   Training Prescription Yes    Weight red bands    Reps 10-15             Perform Capillary Blood Glucose checks as needed.  Exercise Prescription Changes:   Exercise Prescription Changes     Row Name 03/12/22 1500 03/26/22 1500 04/04/22 1500 04/09/22 1500 04/23/22 1500     Response to Exercise   Blood Pressure (Admit) 98/60 102/58 -- 112/70 108/64   Blood Pressure (Exercise) 110/60 116/70 -- 140/68 120/70   Blood Pressure (Exit) 100/54 104/60 -- 100/34 110/78   Heart Rate (Admit) 128 bpm 115 bpm -- 105 bpm 102 bpm   Heart Rate (Exercise) 140 bpm 115 bpm -- 116 bpm 108 bpm   Heart Rate (Exit) 125 bpm 115 bpm -- 110 bpm 105 bpm   Oxygen Saturation (Admit) 97 % 98 % -- 99 % 99 %   Oxygen Saturation (Exercise) 93 % 96 % -- 96 % 93 %   Oxygen Saturation (Exit) 96 % 97 % -- 94 % 95 %   Rating of Perceived Exertion (Exercise) 13 11 -- 11 13   Perceived Dyspnea (Exercise) 1 1 -- 1 1   Duration Continue with 30 min of aerobic exercise without signs/symptoms of physical distress. Continue with 30 min of aerobic exercise without signs/symptoms of physical distress. -- Continue with 30 min of aerobic exercise without signs/symptoms of physical distress. Continue with 30 min of aerobic exercise without signs/symptoms of physical  distress.   Intensity THRR unchanged THRR unchanged -- THRR unchanged THRR unchanged     Progression   Progression Continue to progress workloads to maintain intensity without signs/symptoms of physical distress. Continue to progress workloads to maintain intensity without signs/symptoms of physical distress. -- Continue to progress workloads to maintain intensity without signs/symptoms of physical distress. Continue to progress workloads to maintain intensity without signs/symptoms of physical distress.     Resistance Training   Training Prescription Yes Yes -- Yes Yes   Weight red bands red bands -- red bands red bands   Reps 10-15 10-15 -- 10-15 10-15   Time 10 Minutes 10 Minutes -- 10 Minutes 10 Minutes     Oxygen   Oxygen Continuous Continuous -- Continuous Continuous   Liters 2 2 -- 2 2     Recumbant Bike   Level 1 -- -- -- --   Minutes 15 -- -- -- --   METs 1.9 -- -- -- --     NuStep   Level 1 2 -- 3 3   SPM 60 60 -- 60 60   Minutes 15 30 -- 30 15   METs 1.2 1.4 -- 1.7 1.4     Track   Laps -- -- -- -- 7   Minutes -- -- -- -- 15   METs -- -- -- --  1.81     Home Exercise Plan   Plans to continue exercise at -- -- Home (comment)  walking -- --   Frequency -- -- Add 1 additional day to program exercise sessions. -- --   Initial Home Exercises Provided -- -- 04/04/22 -- --     Oxygen   Maintain Oxygen Saturation 88% or higher 88% or higher -- 88% or higher 88% or higher            Exercise Comments:   Exercise Comments     Row Name 03/07/22 1506 04/04/22 1541         Exercise Comments Pt completed first day of exerccise. Pt exercised on the recumbent bike for 15 min at level 1, METs 2.0. Then exercised on nustep for 15 min at level 1, METs 1.9. He needed rest stops intermittently due to fatigue/SOB and increased HR. Tolerated exercise fair. Performed warm up and cool down standing and sit to stand in place of squats. Discussed with pt METs with reception.  Discussed home exercise plan with pt. He is currently doing warm up and cool down exercises daily at home. Encouraged pt to start walking in house or outside, starting with 5 min and increase to 15-30 min as able. He agreed.               Exercise Goals and Review:   Exercise Goals     Row Name 03/01/22 0955 03/06/22 0850 04/01/22 0905 04/25/22 0933       Exercise Goals   Increase Physical Activity Yes Yes Yes Yes    Intervention Provide advice, education, support and counseling about physical activity/exercise needs.;Develop an individualized exercise prescription for aerobic and resistive training based on initial evaluation findings, risk stratification, comorbidities and participant's personal goals. Provide advice, education, support and counseling about physical activity/exercise needs.;Develop an individualized exercise prescription for aerobic and resistive training based on initial evaluation findings, risk stratification, comorbidities and participant's personal goals. Provide advice, education, support and counseling about physical activity/exercise needs.;Develop an individualized exercise prescription for aerobic and resistive training based on initial evaluation findings, risk stratification, comorbidities and participant's personal goals. Provide advice, education, support and counseling about physical activity/exercise needs.;Develop an individualized exercise prescription for aerobic and resistive training based on initial evaluation findings, risk stratification, comorbidities and participant's personal goals.    Expected Outcomes Short Term: Attend rehab on a regular basis to increase amount of physical activity.;Long Term: Add in home exercise to make exercise part of routine and to increase amount of physical activity.;Long Term: Exercising regularly at least 3-5 days a week. Short Term: Attend rehab on a regular basis to increase amount of physical activity.;Long Term: Add in  home exercise to make exercise part of routine and to increase amount of physical activity.;Long Term: Exercising regularly at least 3-5 days a week. Short Term: Attend rehab on a regular basis to increase amount of physical activity.;Long Term: Add in home exercise to make exercise part of routine and to increase amount of physical activity.;Long Term: Exercising regularly at least 3-5 days a week. Short Term: Attend rehab on a regular basis to increase amount of physical activity.;Long Term: Add in home exercise to make exercise part of routine and to increase amount of physical activity.;Long Term: Exercising regularly at least 3-5 days a week.    Increase Strength and Stamina Yes Yes Yes Yes    Intervention Provide advice, education, support and counseling about physical activity/exercise needs.;Develop an individualized exercise prescription for aerobic  and resistive training based on initial evaluation findings, risk stratification, comorbidities and participant's personal goals. Provide advice, education, support and counseling about physical activity/exercise needs.;Develop an individualized exercise prescription for aerobic and resistive training based on initial evaluation findings, risk stratification, comorbidities and participant's personal goals. Provide advice, education, support and counseling about physical activity/exercise needs.;Develop an individualized exercise prescription for aerobic and resistive training based on initial evaluation findings, risk stratification, comorbidities and participant's personal goals. Provide advice, education, support and counseling about physical activity/exercise needs.;Develop an individualized exercise prescription for aerobic and resistive training based on initial evaluation findings, risk stratification, comorbidities and participant's personal goals.    Expected Outcomes Short Term: Increase workloads from initial exercise prescription for resistance,  speed, and METs.;Short Term: Perform resistance training exercises routinely during rehab and add in resistance training at home;Long Term: Improve cardiorespiratory fitness, muscular endurance and strength as measured by increased METs and functional capacity (6MWT) Short Term: Increase workloads from initial exercise prescription for resistance, speed, and METs.;Short Term: Perform resistance training exercises routinely during rehab and add in resistance training at home;Long Term: Improve cardiorespiratory fitness, muscular endurance and strength as measured by increased METs and functional capacity (6MWT) Short Term: Increase workloads from initial exercise prescription for resistance, speed, and METs.;Short Term: Perform resistance training exercises routinely during rehab and add in resistance training at home;Long Term: Improve cardiorespiratory fitness, muscular endurance and strength as measured by increased METs and functional capacity (6MWT) Short Term: Increase workloads from initial exercise prescription for resistance, speed, and METs.;Short Term: Perform resistance training exercises routinely during rehab and add in resistance training at home;Long Term: Improve cardiorespiratory fitness, muscular endurance and strength as measured by increased METs and functional capacity (6MWT)    Able to understand and use rate of perceived exertion (RPE) scale Yes Yes Yes Yes    Intervention Provide education and explanation on how to use RPE scale Provide education and explanation on how to use RPE scale Provide education and explanation on how to use RPE scale Provide education and explanation on how to use RPE scale    Expected Outcomes Short Term: Able to use RPE daily in rehab to express subjective intensity level;Long Term:  Able to use RPE to guide intensity level when exercising independently Short Term: Able to use RPE daily in rehab to express subjective intensity level;Long Term:  Able to use RPE  to guide intensity level when exercising independently Short Term: Able to use RPE daily in rehab to express subjective intensity level;Long Term:  Able to use RPE to guide intensity level when exercising independently Short Term: Able to use RPE daily in rehab to express subjective intensity level;Long Term:  Able to use RPE to guide intensity level when exercising independently    Able to understand and use Dyspnea scale Yes Yes Yes Yes    Intervention Provide education and explanation on how to use Dyspnea scale Provide education and explanation on how to use Dyspnea scale Provide education and explanation on how to use Dyspnea scale Provide education and explanation on how to use Dyspnea scale    Expected Outcomes Short Term: Able to use Dyspnea scale daily in rehab to express subjective sense of shortness of breath during exertion;Long Term: Able to use Dyspnea scale to guide intensity level when exercising independently Short Term: Able to use Dyspnea scale daily in rehab to express subjective sense of shortness of breath during exertion;Long Term: Able to use Dyspnea scale to guide intensity level when exercising independently Short  Term: Able to use Dyspnea scale daily in rehab to express subjective sense of shortness of breath during exertion;Long Term: Able to use Dyspnea scale to guide intensity level when exercising independently Short Term: Able to use Dyspnea scale daily in rehab to express subjective sense of shortness of breath during exertion;Long Term: Able to use Dyspnea scale to guide intensity level when exercising independently    Knowledge and understanding of Target Heart Rate Range (THRR) Yes Yes Yes Yes    Intervention Provide education and explanation of THRR including how the numbers were predicted and where they are located for reference Provide education and explanation of THRR including how the numbers were predicted and where they are located for reference Provide education and  explanation of THRR including how the numbers were predicted and where they are located for reference Provide education and explanation of THRR including how the numbers were predicted and where they are located for reference    Expected Outcomes Short Term: Able to state/look up THRR;Long Term: Able to use THRR to govern intensity when exercising independently;Short Term: Able to use daily as guideline for intensity in rehab Short Term: Able to state/look up THRR;Long Term: Able to use THRR to govern intensity when exercising independently;Short Term: Able to use daily as guideline for intensity in rehab Short Term: Able to state/look up THRR;Long Term: Able to use THRR to govern intensity when exercising independently;Short Term: Able to use daily as guideline for intensity in rehab Short Term: Able to state/look up THRR;Long Term: Able to use THRR to govern intensity when exercising independently;Short Term: Able to use daily as guideline for intensity in rehab    Understanding of Exercise Prescription Yes Yes Yes Yes    Intervention Provide education, explanation, and written materials on patient's individual exercise prescription Provide education, explanation, and written materials on patient's individual exercise prescription Provide education, explanation, and written materials on patient's individual exercise prescription Provide education, explanation, and written materials on patient's individual exercise prescription    Expected Outcomes Short Term: Able to explain program exercise prescription;Long Term: Able to explain home exercise prescription to exercise independently Short Term: Able to explain program exercise prescription;Long Term: Able to explain home exercise prescription to exercise independently Short Term: Able to explain program exercise prescription;Long Term: Able to explain home exercise prescription to exercise independently Short Term: Able to explain program exercise  prescription;Long Term: Able to explain home exercise prescription to exercise independently             Exercise Goals Re-Evaluation :  Exercise Goals Re-Evaluation     Row Name 03/06/22 0851 04/01/22 0905 04/25/22 0933         Exercise Goal Re-Evaluation   Exercise Goals Review Increase Physical Activity;Increase Strength and Stamina;Able to understand and use rate of perceived exertion (RPE) scale;Able to understand and use Dyspnea scale;Knowledge and understanding of Target Heart Rate Range (THRR);Understanding of Exercise Prescription Increase Physical Activity;Increase Strength and Stamina;Able to understand and use rate of perceived exertion (RPE) scale;Able to understand and use Dyspnea scale;Knowledge and understanding of Target Heart Rate Range (THRR);Understanding of Exercise Prescription Increase Physical Activity;Increase Strength and Stamina;Able to understand and use rate of perceived exertion (RPE) scale;Able to understand and use Dyspnea scale;Knowledge and understanding of Target Heart Rate Range (THRR);Understanding of Exercise Prescription     Comments Pt is scheduled to begin exercise this week. Will monitor and progress as able. Pt has completed 7 exercise sessions and has not missed any. He is exercising  on the nustep for 30 min, level 2, and has progressed to 1.6 METs. He is now tolerating exercise well, HR is stable. He performs warm up and cool down standing.  Will monitor and progress as able. Will discuss home exercise soon. Pt has completed 14 exercise sessions and has missed 1 session. He is exercising on the nustep for 15 min, level 3, and averages 1.6 METs. He is also now walking the track for 15 min, 6-7 laps, max 1.81 METs. He is  tolerating exercise well although does not substantially increase his METs. HR is stable. He performs warm up and cool down standing.  He was recently encouraged to try walking at home. He is daily performing warm up and cool down  exercises at home. Will monitor and progress as able.     Expected Outcomes Through exercise at rehab and home, the patient will decrease shortness of breath with daily activities and feel confident in carrying out an exercise regimen at home. Through exercise at rehab and home, the patient will decrease shortness of breath with daily activities and feel confident in carrying out an exercise regimen at home. Through exercise at rehab and home, the patient will decrease shortness of breath with daily activities and feel confident in carrying out an exercise regimen at home.              Discharge Exercise Prescription (Final Exercise Prescription Changes):  Exercise Prescription Changes - 04/23/22 1500       Response to Exercise   Blood Pressure (Admit) 108/64    Blood Pressure (Exercise) 120/70    Blood Pressure (Exit) 110/78    Heart Rate (Admit) 102 bpm    Heart Rate (Exercise) 108 bpm    Heart Rate (Exit) 105 bpm    Oxygen Saturation (Admit) 99 %    Oxygen Saturation (Exercise) 93 %    Oxygen Saturation (Exit) 95 %    Rating of Perceived Exertion (Exercise) 13    Perceived Dyspnea (Exercise) 1    Duration Continue with 30 min of aerobic exercise without signs/symptoms of physical distress.    Intensity THRR unchanged      Progression   Progression Continue to progress workloads to maintain intensity without signs/symptoms of physical distress.      Resistance Training   Training Prescription Yes    Weight red bands    Reps 10-15    Time 10 Minutes      Oxygen   Oxygen Continuous    Liters 2      NuStep   Level 3    SPM 60    Minutes 15    METs 1.4      Track   Laps 7    Minutes 15    METs 1.81      Oxygen   Maintain Oxygen Saturation 88% or higher             Nutrition:  Target Goals: Understanding of nutrition guidelines, daily intake of sodium <1528m, cholesterol <2025m calories 30% from fat and 7% or less from saturated fats, daily to have 5 or  more servings of fruits and vegetables.  Biometrics:  Pre Biometrics - 03/01/22 0941       Pre Biometrics   Grip Strength 20 kg              Nutrition Therapy Plan and Nutrition Goals:  Nutrition Therapy & Goals - 04/23/22 1612       Nutrition Therapy   Diet  Heart Healthy Diet      Personal Nutrition Goals   Nutrition Goal Patient to identify strategies for weight gain of 0.5-2.0# per week of weight gain    Personal Goal #2 Patient to choose a daily variety of fruits, vegetables, whole grains, lean protein/plant protein, and dairy as part of heart healthy lifestyle    Comments Goals in action. Traeton continues drinking Boost (530kcals, 22g protein) for breakfast, homemade smoothie mid-da7 with protein powder and peanut butter, and Ensure Complete (350kcals, 30g protein) daily. He has been struggling to tolerate the Boost 530kcals. We replacing the Boost product with Ensure 350kcal up to 3x per day to aid with meeting calorie needs. He is awaiting prior authorization for marinol and will transition from mirtazapine to marinol to aid with weight gain. He will follow-up with [facility] transplant team in November.      Intervention Plan   Intervention Prescribe, educate and counsel regarding individualized specific dietary modifications aiming towards targeted core components such as weight, hypertension, lipid management, diabetes, heart failure and other comorbidities.;Nutrition handout(s) given to patient.    Expected Outcomes Short Term Goal: Understand basic principles of dietary content, such as calories, fat, sodium, cholesterol and nutrients.;Long Term Goal: Adherence to prescribed nutrition plan.             Nutrition Assessments:  MEDIFICTS Score Key: ?70 Need to make dietary changes  40-70 Heart Healthy Diet ? 40 Therapeutic Level Cholesterol Diet   Picture Your Plate Scores: <96 Unhealthy dietary pattern with much room for improvement. 41-50 Dietary pattern  unlikely to meet recommendations for good health and room for improvement. 51-60 More healthful dietary pattern, with some room for improvement.  >60 Healthy dietary pattern, although there may be some specific behaviors that could be improved.    Nutrition Goals Re-Evaluation:  Nutrition Goals Re-Evaluation     [place] Name 03/07/22 1427 04/02/22 1444 04/23/22 1612         Goals   Current Weight 101 lb 3.1 oz (45.9 kg) 103 lb 6.3 oz (46.9 kg) 103 lb 6.3 oz (46.9 kg)     Comment lipid panel WNL, A1c 6.0; patient is up 2.86# since his orientation on 8/25 (start weight 44.6kg). No new labs at this time. no new labs at this time. He is up 5# since starting with our program; however, he maintained weight over the last 4 weeks.     Expected Outcome Goals in action. 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 Zayon is drinking Boost (530kcals, 22g protein) for breakfast and smoothie in the afternoon with protein powder and peanut butter. He is taking remeron and continues three regular meals daily + supplements. Per wife, he trying to gain up to 112-113# for transplant. Per past RD notes, patient was weighing 123# in January 2023. Goals in progress. Victory continues 2-3 nutrition supplements daily + three meals daily. He reports great appetite. His family remains very supportive. He did have one incidence of vomiting after drinking high calorie Ensure (530kcals, 22g protein); he is receptive to trying some alternative ways to add calories. He reports no other GI concerns. Gave recipes for high calorie desserts using Ensure, unflavored protein powder, and high calorie food add-ins. He is up 5.2# since starting our program. Goals in action. Delford continues drinking Boost (530kcals, 22g protein) for breakfast, homemade smoothie mid-da7 with protein powder and peanut butter, and Ensure Complete (350kcals, 30g protein) daily.  He  has been struggling to tolerate the Boost 530kcals. We replacing the Boost product with Ensure 350kcal up to 3x per day to aid with meeting calorie needs. He is awaiting prior authorization for marinol and will transition from mirtazapine to marinol to aid with weight gain. He will follow-up with [facility] transplant team in November.              Nutrition Goals Discharge (Final Nutrition Goals Re-Evaluation):  Nutrition Goals Re-Evaluation - 04/23/22 1612       Goals   Current Weight 103 lb 6.3 oz (46.9 kg)    Comment no new labs at this time. He is up 5# since starting with our program; however, he maintained weight over the last 4 weeks.    Expected Outcome Goals in action. Abdishakur continues drinking Boost (530kcals, 22g protein) for breakfast, homemade smoothie mid-da7 with protein powder and peanut butter, and Ensure Complete (350kcals, 30g protein) daily. He has been struggling to tolerate the Boost 530kcals. We replacing the Boost product with Ensure 350kcal up to 3x per day to aid with meeting calorie needs. He is awaiting prior authorization for marinol and will transition from mirtazapine to marinol to aid with weight gain. He will follow-up with [facility] transplant team in November.             Psychosocial: Target Goals: Acknowledge presence or absence of significant depression and/or stress, maximize coping skills, provide positive support system. Participant is able to verbalize types and ability to use techniques and skills needed for reducing stress and depression.  Initial Review & Psychosocial Screening:  Initial Psych Review & Screening - 03/01/22 0949       Initial Review   Current issues with None Identified      Family Dynamics   Good Support System? Yes    Comments wife and children, friends      Barriers   Psychosocial barriers to participate in program There are no identifiable barriers or psychosocial needs.      Screening Interventions   Interventions  Encouraged to exercise             Quality of Life Scores:  Scores of 19 and below usually indicate a poorer quality of life in these areas.  A difference of  2-3 points is a clinically meaningful difference.  A difference of 2-3 points in the total score of the Quality of Life Index has been associated with significant improvement in overall quality of life, self-image, physical symptoms, and general health in studies assessing change in quality of life.  PHQ-9: Review Flowsheet  More data exists      04/30/2022 03/01/2022 01/28/2022 12/04/2021 11/19/2021  Depression screen PHQ 2/9  Decreased Interest 0 0 0 0 0  Down, Depressed, Hopeless 0 0 0 0 0  PHQ - 2 Score 0 0 0 0 0  Altered sleeping - 0 - - -  Tired, decreased energy - 0 - - -  Change in appetite - 0 - - -  Feeling bad or failure about yourself  - 0 - - -  Trouble concentrating - 0 - - -  Moving slowly or fidgety/restless - 0 - - -  Suicidal thoughts - 0 - - -  PHQ-9 Score - 0 - - -   Interpretation of Total Score  Total Score Depression Severity:  1-4 = Minimal depression, 5-9 = Mild depression, 10-14 = Moderate depression, 15-19 = Moderately severe depression, 20-27 = Severe depression   Psychosocial Evaluation  and Intervention:  Psychosocial Evaluation - 03/01/22 0949       Psychosocial Evaluation & Interventions   Interventions Encouraged to exercise with the program and follow exercise prescription    Expected Outcomes For Dayna to participate in Pulmonary Rehab    Continue Psychosocial Services  Follow up required by staff             Psychosocial Re-Evaluation:  Psychosocial Re-Evaluation     [place] Name 03/12/22 1002 04/02/22 0936 04/25/22 1614         Psychosocial Re-Evaluation   Current issues with None Identified None Identified None Identified     Comments Izmael has attended only one session thus far. He had no issues. We will continue to monitor for any psychosocial barriers. Damaree has  been participating in the PR program without any current psychosocial barriers. He seems to be enjoying the classes and making social connections with other class members. Aeron has been attending the PR program without any psychsocial barriers. He states that he feels he is getting a good workout. He seems to be enjoying the program and having interactions with others in the class.     Expected Outcomes For him to particiapte in the PR program without any psychosocial concerns. For him to continue to attend the program without any psychosocial issues or concerns. For him to cotinue to participate in the program without any psychsocial issues or concerns.     Interventions Encouraged to attend Pulmonary Rehabilitation for the exercise Encouraged to attend Pulmonary Rehabilitation for the exercise Encouraged to attend Pulmonary Rehabilitation for the exercise     Continue Psychosocial Services  No Follow up required No Follow up required No Follow up required              Psychosocial Discharge (Final Psychosocial Re-Evaluation):  Psychosocial Re-Evaluation - 04/25/22 1614       Psychosocial Re-Evaluation   Current issues with None Identified    Comments Case has been attending the PR program without any psychsocial barriers. He states that he feels he is getting a good workout. He seems to be enjoying the program and having interactions with others in the class.    Expected Outcomes For him to cotinue to participate in the program without any psychsocial issues or concerns.    Interventions Encouraged to attend Pulmonary Rehabilitation for the exercise    Continue Psychosocial Services  No Follow up required             Education: Education Goals: Education classes will be provided on a weekly basis, covering required topics. Participant will state understanding/return demonstration of topics presented.  Learning Barriers/Preferences:  Learning Barriers/Preferences - 03/01/22  0950       Learning Barriers/Preferences   Learning Barriers Sight   wears glasses   Learning Preferences None             Education Topics: Introduction to Pulmonary Rehab Group instruction provided by PowerPoint, verbal discussion, and written material to support subject matter. Instructor reviews what Pulmonary Rehab is, the purpose of the program, and how patients are referred.     Know Your Numbers Group instruction that is supported by a PowerPoint presentation. Instructor discusses importance of knowing and understanding resting, exercise, and post-exercise oxygen saturation, heart rate, and blood pressure. Oxygen saturation, heart rate, blood pressure, rating of perceived exertion, and dyspnea are reviewed along with a normal range for these values.  Flowsheet Row PULMONARY REHAB OTHER RESPIRATORY from 04/25/2022 in [place]  HOSPITAL CARDIAC REHAB  Date 04/25/22  Educator EP  Instruction Review Code 1- Verbalizes Understanding       Exercise for the Pulmonary Patient Group instruction that is supported by a PowerPoint presentation. Instructor discusses benefits of exercise, core components of exercise, frequency, duration, and intensity of an exercise routine, importance of utilizing pulse oximetry during exercise, safety while exercising, and options of places to exercise outside of rehab.       MET Level  Group instruction provided by PowerPoint, verbal discussion, and written material to support subject matter. Instructor reviews what METs are and how to increase METs.    Pulmonary Medications Verbally interactive group education provided by instructor with focus on inhaled medications and proper administration.   Anatomy and Physiology of the Respiratory System Group instruction provided by PowerPoint, verbal discussion, and written material to support subject matter. Instructor reviews respiratory cycle and anatomical components of the respiratory  system and their functions. Instructor also reviews differences in obstructive and restrictive respiratory diseases with examples of each.  Flowsheet Row PULMONARY REHAB OTHER RESPIRATORY from 04/25/2022 in [place]  Date 03/07/22  Educator Donnetta Simpers  Instruction Review Code 1- Verbalizes Understanding       Oxygen Safety Group instruction provided by PowerPoint, verbal discussion, and written material to support subject matter. There is an overview of "What is Oxygen" and "Why do we need it".  Instructor also reviews how to create a safe environment for oxygen use, the importance of using oxygen as prescribed, and the risks of noncompliance. There is a brief discussion on traveling with oxygen and resources the patient may utilize. Flowsheet Row PULMONARY REHAB OTHER RESPIRATORY from 04/25/2022 in [place]  Date 03/14/22  Educator Kelle Darting  Instruction Review Code 1- Verbalizes Understanding       Oxygen Use Group instruction provided by PowerPoint, verbal discussion, and written material to discuss how supplemental oxygen is prescribed and different types of oxygen supply systems. Resources for more information are provided.  Flowsheet Row PULMONARY REHAB OTHER RESPIRATORY from 04/25/2022 in [place]  Date 03/21/22  Educator Kelle Darting  Instruction Review Code 1- Verbalizes Understanding       Breathing Techniques Group instruction that is supported by demonstration and informational handouts. Instructor discusses the benefits of pursed lip and diaphragmatic breathing and detailed demonstration on how to perform both.  Flowsheet Row PULMONARY REHAB OTHER RESPIRATORY from 04/25/2022 in [place]  Date 03/28/22  Educator EP  Instruction Review Code 1- Verbalizes Understanding        Risk Factor Reduction Group instruction that is supported by a  PowerPoint presentation. Instructor discusses the definition of a risk factor, different risk factors for pulmonary disease, and how the heart and lungs work together. Flowsheet Row PULMONARY REHAB OTHER RESPIRATORY from 04/25/2022 in [place]  Date 04/04/22  Educator EP  Instruction Review Code 1- Verbalizes Understanding       MD Day A group question and answer session with a medical doctor that allows participants to ask questions that relate to their pulmonary disease state.   Nutrition for the Pulmonary Patient Group instruction provided by PowerPoint slides, verbal discussion, and written materials to support subject matter. The instructor gives an explanation and review of healthy diet recommendations, which includes a discussion on weight management, recommendations for fruit and vegetable consumption, as well as protein, fluid, caffeine, fiber, sodium, sugar, and alcohol.  Tips for eating when patients are short of breath are discussed.    Other Education Group or individual verbal, written, or video instructions that support the educational goals of the pulmonary rehab program.    Knowledge Questionnaire Score:  Knowledge Questionnaire Score - 03/01/22 0959       Knowledge Questionnaire Score   Pre Score 15/18             Core Components/Risk Factors/Patient Goals at Admission:  Personal Goals and Risk Factors at Admission - 03/01/22 0951       Core Components/Risk Factors/Patient Goals on Admission    Weight Management Weight Gain    Improve shortness of breath with ADL's Yes    Intervention Provide education, individualized exercise plan and daily activity instruction to help decrease symptoms of SOB with activities of daily living.    Expected Outcomes Short Term: Improve cardiorespiratory fitness to achieve a reduction of symptoms when performing ADLs;Long Term: Be able to perform more ADLs without symptoms or delay the onset of  symptoms             Core Components/Risk Factors/Patient Goals Review:   Goals and Risk Factor Review     Row Name 03/12/22 1546 04/02/22 0940 04/25/22 1618         Core Components/Risk Factors/Patient Goals Review   Personal Goals Review Other;Improve shortness of breath with ADL's;Develop more efficient breathing techniques such as purse lipped breathing and diaphragmatic breathing and practicing self-pacing with activity.;Increase knowledge of respiratory medications and ability to use respiratory devices properly. Weight Management/Obesity;Improve shortness of breath with ADL's;Develop more efficient breathing techniques such as purse lipped breathing and diaphragmatic breathing and practicing self-pacing with activity.;Increase knowledge of respiratory medications and ability to use respiratory devices properly. Weight Management/Obesity;Improve shortness of breath with ADL's;Develop more efficient breathing techniques such as purse lipped breathing and diaphragmatic breathing and practicing self-pacing with activity.;Increase knowledge of respiratory medications and ability to use respiratory devices properly.     Review Tiago is just getting started in the PR program and has attended 1 session. We will continue to his core components. Jaysten has had weight gain from starting the program at 45.9 kg ro currently being up to 47.1 kg. He has had consultations with the dietician. He is exercising on the nustep with slow progress with increasing his workloads and METS. He is reporting only mild SOB with exercise and his oxygen saturations have been 93-96% on his perscribed 2 liters O2. He did attempt the recumbent bike when he first started the program but, it was to difficult for him and caused his heart rate to go above his maximun. Leiland has been maintaining his weight. No significant gain or loss. He is exercising on the nustep and walking the track. He has slowly been increasing his  workloads and METS with consideration of his deconditioning and lung disease. He reports mild  to mild with difficulty SOB and his oxygen saturations have been 88-93% on 3  liters of oxygen with exercisng.     Expected Outcomes See admission goals See admission goals. See admission goals              Core Components/Risk Factors/Patient Goals at Discharge (Final Review):   Goals and Risk Factor Review - 04/25/22 1618       Core Components/Risk Factors/Patient Goals Review   Personal Goals Review Weight Management/Obesity;Improve shortness of breath with ADL's;Develop more efficient breathing techniques such as purse lipped breathing and diaphragmatic breathing and practicing self-pacing  with activity.;Increase knowledge of respiratory medications and ability to use respiratory devices properly.    Review Javonni has been maintaining his weight. No significant gain or loss. He is exercising on the nustep and walking the track. He has slowly been increasing his workloads and METS with consideration of his deconditioning and lung disease. He reports mild  to mild with difficulty SOB and his oxygen saturations have been 88-93% on 3  liters of oxygen with exercisng.    Expected Outcomes See admission goals             ITP Comments: ITP REVIEW Pt is making expected progress toward pulmonary rehab goals after completing 16 sessions. Recommend continued exercise, life style modification, education, and utilization of breathing techniques to increase stamina and strength and decrease shortness of breath with exertion.   Comments: Dr. Rodman Pickle is Medical Director for Pulmonary Rehab at [facility].

## 2022-05-02 ENCOUNTER — Encounter (HOSPITAL_COMMUNITY): Payer: Medicare Other

## 2022-05-02 ENCOUNTER — Encounter (HOSPITAL_COMMUNITY)
Admission: RE | Admit: 2022-05-02 | Discharge: 2022-05-02 | Disposition: A | Discharge status: Home / Self Care | Payer: Medicare Other | Source: Ambulatory Visit | Attending: Pulmonary Disease | Admitting: Pulmonary Disease

## 2022-05-02 VITALS — Wt 102.1 lb

## 2022-05-02 DIAGNOSIS — R0902 Hypoxemia: Secondary | ICD-10-CM | POA: Diagnosis not present

## 2022-05-02 NOTE — Progress Notes (Signed)
Daily Session Note  Patient Details  Name: Earl Thomas MRN: [id number] Date of Birth: 1951-01-14 Referring Provider:   [name] Pulmonary Rehab Walk Test from 03/01/2022 in [place]  Referring Provider [name]       Encounter Date: 05/02/2022  Check In:  Session Check In - 05/02/22 1341       Check-In   Supervising physician immediately available to respond to emergencies [facility] - Physician supervision    Physician(s) Gala Murdoch    Location [facility]    Staff Present Rodney Langton, Cathleen Fears, MS, ACSM-CEP, Exercise Physiologist;Sheilah Rayos Olen Cordial BS, ACSM-CEP, Exercise Physiologist;David Lilyan Punt, MS, ACSM-CEP, CCRP, Exercise Physiologist;Jetta Walker BS, ACSM-CEP, Exercise Physiologist    Virtual Visit No    Medication changes reported     No    Fall or balance concerns reported    No    Tobacco Cessation No Change    Warm-up and Cool-down Performed as group-led instruction    Resistance Training Performed Yes    VAD Patient? No    PAD/SET Patient? No      Pain Assessment   Currently in Pain? No/denies    Multiple Pain Sites No             Capillary Blood Glucose: No results found for this or any previous visit (from the past 24 hour(s)).   Exercise Prescription Changes - 05/02/22 1500       Response to Exercise   Blood Pressure (Admit) 110/68    Blood Pressure (Exercise) 114/64    Blood Pressure (Exit) 108/70    Heart Rate (Admit) 112 bpm    Heart Rate (Exercise) 114 bpm    Heart Rate (Exit) 110 bpm    Oxygen Saturation (Admit) 98 %    Oxygen Saturation (Exercise) 90 %    Oxygen Saturation (Exit) 97 %    Rating of Perceived Exertion (Exercise) 13    Perceived Dyspnea (Exercise) 1    Duration Continue with 30 min of aerobic exercise without signs/symptoms of physical distress.    Intensity THRR unchanged      Progression   Progression Continue to progress workloads to maintain intensity  without signs/symptoms of physical distress.      Resistance Training   Training Prescription Yes    Weight red bands    Reps 10-15    Time 10 Minutes      Interval Training   Interval Training No      Oxygen   Oxygen Continuous    Liters 2      NuStep   Level 4    SPM 60    Minutes 15    METs 1.9      Track   Laps 6    Minutes 15    METs 1.7             Social History   Tobacco Use  Smoking Status Former   Types: Pipe   Quit date: 10/06/1988   Years since quitting: 33.5   Passive exposure: Never  Smokeless Tobacco Never  Tobacco Comments   parent smoked in home as a child.  Smoked a pip in college, not daily.    Goals Met:  Independence with exercise equipment Exercise tolerated well No report of concerns or symptoms today Strength training completed today  Goals Unmet:  Not Applicable  Comments: Service time is from 1327 to 1450.    Dr. Rodman Pickle is Medical Director for Pulmonary  Rehab at [facility].

## 2022-05-07 ENCOUNTER — Encounter (HOSPITAL_COMMUNITY): Payer: Medicare Other

## 2022-05-08 ENCOUNTER — Telehealth: Payer: Self-pay | Admitting: Emergency Medicine

## 2022-05-08 ENCOUNTER — Other Ambulatory Visit: Payer: Self-pay | Admitting: Emergency Medicine

## 2022-05-08 MED ORDER — HYDROCORTISONE (PERIANAL) 2.5 % EX CREA: 1.0000 | TOPICAL_CREAM | Freq: Two times a day (BID) | CUTANEOUS | 0 refills | Status: DC

## 2022-05-08 NOTE — Telephone Encounter (Signed)
Called patient and informed him that his medication was sent to his requested pharmacy

## 2022-05-08 NOTE — Telephone Encounter (Signed)
Pt called requesting prescription for medication he used to get with Primary doctor Chaya Jan in 2019.  PROCTOZONE HC 2.5%  Please call pt [phone]

## 2022-05-08 NOTE — Telephone Encounter (Signed)
New prescription sent to pharmacy of record.  Thanks.

## 2022-05-09 ENCOUNTER — Encounter (HOSPITAL_COMMUNITY)
Admission: RE | Admit: 2022-05-09 | Discharge: 2022-05-09 | Disposition: A | Discharge status: Home / Self Care | Payer: Medicare Other | Source: Ambulatory Visit | Attending: Pulmonary Disease | Admitting: Pulmonary Disease

## 2022-05-09 ENCOUNTER — Encounter (HOSPITAL_COMMUNITY): Payer: Medicare Other

## 2022-05-09 DIAGNOSIS — R0902 Hypoxemia: Secondary | ICD-10-CM | POA: Insufficient documentation

## 2022-05-09 NOTE — Progress Notes (Signed)
Daily Session Note  Patient Details  Name: Earl Thomas MRN: [id number] Date of Birth: 1950-09-05 Referring Provider:   [name] Pulmonary Rehab Walk Test from 03/01/2022 in [place]  Referring Provider [name]       Encounter Date: 05/09/2022  Check In:  Session Check In - 05/09/22 1437       Check-In   Supervising physician immediately available to respond to emergencies [facility] - Physician supervision    Physician(s) Dr. Erin Fulling    Location [facility]    Staff Present Elmon Else, MS, ACSM-CEP, Exercise Physiologist;David Lilyan Punt, MS, ACSM-CEP, CCRP, Exercise Physiologist;Bailey Pearline Cables, MS, Exercise Physiologist;Lisa Ysidro Evert, RN    Virtual Visit No    Medication changes reported     No    Fall or balance concerns reported    No    Tobacco Cessation No Change    Warm-up and Cool-down Performed as group-led instruction    Resistance Training Performed Yes    VAD Patient? No    PAD/SET Patient? No      Pain Assessment   Currently in Pain? No/denies    Multiple Pain Sites No             Capillary Blood Glucose: No results found for this or any previous visit (from the past 24 hour(s)).    Social History   Tobacco Use  Smoking Status Former   Types: Pipe   Quit date: 10/06/1988   Years since quitting: 33.6   Passive exposure: Never  Smokeless Tobacco Never  Tobacco Comments   parent smoked in home as a child.  Smoked a pip in college, not daily.    Goals Met:  Proper associated with RPD/PD & O2 Sat Independence with exercise equipment Exercise tolerated well No report of concerns or symptoms today Strength training completed today  Goals Unmet:  Not Applicable  Comments: Service time is from 1329 to 1458.    Dr. Rodman Pickle is Medical Director for Pulmonary Rehab at [facility].

## 2022-05-14 ENCOUNTER — Encounter (HOSPITAL_COMMUNITY)
Admission: RE | Admit: 2022-05-14 | Discharge: 2022-05-14 | Disposition: A | Discharge status: Home / Self Care | Payer: Medicare Other | Source: Ambulatory Visit | Attending: Pulmonary Disease | Admitting: Pulmonary Disease

## 2022-05-14 VITALS — Wt 99.6 lb

## 2022-05-14 DIAGNOSIS — R0902 Hypoxemia: Secondary | ICD-10-CM

## 2022-05-15 NOTE — Progress Notes (Signed)
Discharge Progress Report  Patient Details  Name: Earl Thomas MRN: [id number] Date of Birth: 1951/03/12 Referring Provider:   [name] Walk Test from 03/01/2022 in [place]  Referring Provider Maceo Pro  [[place]]        Number of Visits: 19  Reason for Discharge:  Patient has met program and personal goals.  Smoking History:  Social History   Tobacco Use  Smoking Status Former   Types: Pipe   Quit date: 10/06/1988   Years since quitting: 33.6   Passive exposure: Never  Smokeless Tobacco Never  Tobacco Comments   parent smoked in home as a child.  Smoked a pip in college, not daily.    Diagnosis:  Hypoxemia  ADL UCSD:  Pulmonary Assessment Scores     Row Name 03/01/22 0954 05/02/22 1511       ADL UCSD   ADL Phase Entry Exit    SOB Score total 29 69      CAT Score   CAT Score 22 25      mMRC Score   mMRC Score 3 --             Initial Exercise Prescription:  Initial Exercise Prescription - 03/01/22 1100       Date of Initial Exercise RX and Referring Provider   Date 03/01/22    Referring Provider [name]   Expected Discharge Date 05/09/22      Oxygen   Oxygen Continuous    Liters 2    Maintain Oxygen Saturation 88% or higher      Recumbant Bike   Level 1    Watts 24    Minutes 15      NuStep   Level 1    SPM 60    Minutes 15      Prescription Details   Frequency (times per week) 2    Duration Progress to 30 minutes of continuous aerobic without signs/symptoms of physical distress      Intensity   THRR 40-80% of Max Heartrate 61-122    Ratings of Perceived Exertion 11-13    Perceived Dyspnea 0-4      Progression   Progression Continue to progress workloads to maintain intensity without signs/symptoms of physical distress.      Resistance Training   Training Prescription Yes    Weight red bands    Reps 10-15             Discharge Exercise Prescription  (Final Exercise Prescription Changes):  Exercise Prescription Changes - 05/02/22 1500       Response to Exercise   Blood Pressure (Admit) 110/68    Blood Pressure (Exercise) 114/64    Blood Pressure (Exit) 108/70    Heart Rate (Admit) 112 bpm    Heart Rate (Exercise) 114 bpm    Heart Rate (Exit) 110 bpm    Oxygen Saturation (Admit) 98 %    Oxygen Saturation (Exercise) 90 %    Oxygen Saturation (Exit) 97 %    Rating of Perceived Exertion (Exercise) 13    Perceived Dyspnea (Exercise) 1    Duration Continue with 30 min of aerobic exercise without signs/symptoms of physical distress.    Intensity THRR unchanged      Progression   Progression Continue to progress workloads to maintain intensity without signs/symptoms of physical distress.      Resistance Training   Training Prescription Yes    Weight red bands  Reps 10-15    Time 10 Minutes      Interval Training   Interval Training No      Oxygen   Oxygen Continuous    Liters 2      NuStep   Level 4    SPM 60    Minutes 15    METs 1.9      Track   Laps 6    Minutes 15    METs 1.7             Functional Capacity:  6 Minute Walk     Row Name 03/01/22 1054 05/14/22 1551       6 Minute Walk   Phase Initial Initial    Distance 1252 feet 1090 feet    Distance % Change -- -12.94 %    Distance Feet Change -- 162 ft    Walk Time 6 minutes 6 minutes    # of Rest Breaks 0 0    MPH 2.37 2.06    METS 3.72 3.44    RPE 12 13    Perceived Dyspnea  2 1    VO2 Peak 13.02 12.03    Symptoms No No    Resting HR 110 bpm 96 bpm    Resting BP 98/58 106/66    Resting Oxygen Saturation  97 % 95 %    Exercise Oxygen Saturation  during 6 min walk 90 % 83 %    Max Ex. HR 122 bpm 127 bpm    Max Ex. BP 132/60 130/72    2 Minute Post BP 100/50 108/66      Interval HR   1 Minute HR 117 123    2 Minute HR 119 127    3 Minute HR 120 126    4 Minute HR 120 124    5 Minute HR 119 114    6 Minute HR 122 125    2 Minute  Post HR 101 101    Interval Heart Rate? Yes Yes      Interval Oxygen   Interval Oxygen? Yes Yes    Baseline Oxygen Saturation % 97 % 95 %    1 Minute Oxygen Saturation % 97 % 94 %    1 Minute Liters of Oxygen 2 L 2 L    2 Minute Oxygen Saturation % 96 % 88 %    2 Minute Liters of Oxygen 2 L 2 L    3 Minute Oxygen Saturation % 97 % 86 %    3 Minute Liters of Oxygen 2 L 2 L    4 Minute Oxygen Saturation % 93 % 88 %  83% @ 4:35    4 Minute Liters of Oxygen 2 L 3 L  4    5 Minute Oxygen Saturation % 90 % 92 %    5 Minute Liters of Oxygen 2 L 4 L    6 Minute Oxygen Saturation % 93 % 89 %    6 Minute Liters of Oxygen 2 L 4 L    2 Minute Post Oxygen Saturation % 93 % 92 %    2 Minute Post Liters of Oxygen 2 L 4 L             Psychological, QOL, Others - Outcomes: PHQ 2/9:    05/14/2022    3:56 PM 04/30/2022   11:09 AM 03/01/2022    9:48 AM 01/28/2022   10:00 AM 12/04/2021   10:43 AM  Depression screen PHQ 2/9  Decreased Interest 0 0 0 0 0  Down, Depressed, Hopeless 0 0 0 0 0  PHQ - 2 Score 0 0 0 0 0  Altered sleeping 0  0    Tired, decreased energy 0  0    Change in appetite 0  0    Feeling bad or failure about yourself  0  0    Trouble concentrating 0  0    Moving slowly or fidgety/restless 0  0    Suicidal thoughts 0  0    PHQ-9 Score 0  0      Quality of Life:   Personal Goals: Goals established at orientation with interventions provided to work toward goal.  Personal Goals and Risk Factors at Admission - 03/01/22 0951       Core Components/Risk Factors/Patient Goals on Admission    Weight Management Weight Gain    Improve shortness of breath with ADL's Yes    Intervention Provide education, individualized exercise plan and daily activity instruction to help decrease symptoms of SOB with activities of daily living.    Expected Outcomes Short Term: Improve cardiorespiratory fitness to achieve a reduction of symptoms when performing ADLs;Long Term: Be able to  perform more ADLs without symptoms or delay the onset of symptoms              Personal Goals Discharge:  Goals and Risk Factor Review     Row Name 03/12/22 1546 04/02/22 0940 04/25/22 1618         Core Components/Risk Factors/Patient Goals Review   Personal Goals Review Other;Improve shortness of breath with ADL's;Develop more efficient breathing techniques such as purse lipped breathing and diaphragmatic breathing and practicing self-pacing with activity.;Increase knowledge of respiratory medications and ability to use respiratory devices properly. Weight Management/Obesity;Improve shortness of breath with ADL's;Develop more efficient breathing techniques such as purse lipped breathing and diaphragmatic breathing and practicing self-pacing with activity.;Increase knowledge of respiratory medications and ability to use respiratory devices properly. Weight Management/Obesity;Improve shortness of breath with ADL's;Develop more efficient breathing techniques such as purse lipped breathing and diaphragmatic breathing and practicing self-pacing with activity.;Increase knowledge of respiratory medications and ability to use respiratory devices properly.     Review Elih is just getting started in the PR program and has attended 1 session. We will continue to his core components. Baily has had weight gain from starting the program at 45.9 kg ro currently being up to 47.1 kg. He has had consultations with the dietician. He is exercising on the nustep with slow progress with increasing his workloads and METS. He is reporting only mild SOB with exercise and his oxygen saturations have been 93-96% on his perscribed 2 liters O2. He did attempt the recumbent bike when he first started the program but, it was to difficult for him and caused his heart rate to go above his maximun. Jasiel has been maintaining his weight. No significant gain or loss. He is exercising on the nustep and walking the track. He has  slowly been increasing his workloads and METS with consideration of his deconditioning and lung disease. He reports mild  to mild with difficulty SOB and his oxygen saturations have been 88-93% on 3  liters of oxygen with exercisng.     Expected Outcomes See admission goals See admission goals. See admission goals              Exercise Goals and Review:  Exercise Goals     Row Name 03/01/22 [id number] 03/06/22 [phone]  04/01/22 0905 04/25/22 0933       Exercise Goals   Increase Physical Activity Yes Yes Yes Yes    Intervention Provide advice, education, support and counseling about physical activity/exercise needs.;Develop an individualized exercise prescription for aerobic and resistive training based on initial evaluation findings, risk stratification, comorbidities and participant's personal goals. Provide advice, education, support and counseling about physical activity/exercise needs.;Develop an individualized exercise prescription for aerobic and resistive training based on initial evaluation findings, risk stratification, comorbidities and participant's personal goals. Provide advice, education, support and counseling about physical activity/exercise needs.;Develop an individualized exercise prescription for aerobic and resistive training based on initial evaluation findings, risk stratification, comorbidities and participant's personal goals. Provide advice, education, support and counseling about physical activity/exercise needs.;Develop an individualized exercise prescription for aerobic and resistive training based on initial evaluation findings, risk stratification, comorbidities and participant's personal goals.    Expected Outcomes Short Term: Attend rehab on a regular basis to increase amount of physical activity.;Long Term: Add in home exercise to make exercise part of routine and to increase amount of physical activity.;Long Term: Exercising regularly at least 3-5 days a week. Short Term:  Attend rehab on a regular basis to increase amount of physical activity.;Long Term: Add in home exercise to make exercise part of routine and to increase amount of physical activity.;Long Term: Exercising regularly at least 3-5 days a week. Short Term: Attend rehab on a regular basis to increase amount of physical activity.;Long Term: Add in home exercise to make exercise part of routine and to increase amount of physical activity.;Long Term: Exercising regularly at least 3-5 days a week. Short Term: Attend rehab on a regular basis to increase amount of physical activity.;Long Term: Add in home exercise to make exercise part of routine and to increase amount of physical activity.;Long Term: Exercising regularly at least 3-5 days a week.    Increase Strength and Stamina Yes Yes Yes Yes    Intervention Provide advice, education, support and counseling about physical activity/exercise needs.;Develop an individualized exercise prescription for aerobic and resistive training based on initial evaluation findings, risk stratification, comorbidities and participant's personal goals. Provide advice, education, support and counseling about physical activity/exercise needs.;Develop an individualized exercise prescription for aerobic and resistive training based on initial evaluation findings, risk stratification, comorbidities and participant's personal goals. Provide advice, education, support and counseling about physical activity/exercise needs.;Develop an individualized exercise prescription for aerobic and resistive training based on initial evaluation findings, risk stratification, comorbidities and participant's personal goals. Provide advice, education, support and counseling about physical activity/exercise needs.;Develop an individualized exercise prescription for aerobic and resistive training based on initial evaluation findings, risk stratification, comorbidities and participant's personal goals.    Expected  Outcomes Short Term: Increase workloads from initial exercise prescription for resistance, speed, and METs.;Short Term: Perform resistance training exercises routinely during rehab and add in resistance training at home;Long Term: Improve cardiorespiratory fitness, muscular endurance and strength as measured by increased METs and functional capacity (6MWT) Short Term: Increase workloads from initial exercise prescription for resistance, speed, and METs.;Short Term: Perform resistance training exercises routinely during rehab and add in resistance training at home;Long Term: Improve cardiorespiratory fitness, muscular endurance and strength as measured by increased METs and functional capacity (6MWT) Short Term: Increase workloads from initial exercise prescription for resistance, speed, and METs.;Short Term: Perform resistance training exercises routinely during rehab and add in resistance training at home;Long Term: Improve cardiorespiratory fitness, muscular endurance and strength as measured by increased METs and functional capacity (6MWT) Short  Term: Increase workloads from initial exercise prescription for resistance, speed, and METs.;Short Term: Perform resistance training exercises routinely during rehab and add in resistance training at home;Long Term: Improve cardiorespiratory fitness, muscular endurance and strength as measured by increased METs and functional capacity (6MWT)    Able to understand and use rate of perceived exertion (RPE) scale Yes Yes Yes Yes    Intervention Provide education and explanation on how to use RPE scale Provide education and explanation on how to use RPE scale Provide education and explanation on how to use RPE scale Provide education and explanation on how to use RPE scale    Expected Outcomes Short Term: Able to use RPE daily in rehab to express subjective intensity level;Long Term:  Able to use RPE to guide intensity level when exercising independently Short Term: Able to  use RPE daily in rehab to express subjective intensity level;Long Term:  Able to use RPE to guide intensity level when exercising independently Short Term: Able to use RPE daily in rehab to express subjective intensity level;Long Term:  Able to use RPE to guide intensity level when exercising independently Short Term: Able to use RPE daily in rehab to express subjective intensity level;Long Term:  Able to use RPE to guide intensity level when exercising independently    Able to understand and use Dyspnea scale Yes Yes Yes Yes    Intervention Provide education and explanation on how to use Dyspnea scale Provide education and explanation on how to use Dyspnea scale Provide education and explanation on how to use Dyspnea scale Provide education and explanation on how to use Dyspnea scale    Expected Outcomes Short Term: Able to use Dyspnea scale daily in rehab to express subjective sense of shortness of breath during exertion;Long Term: Able to use Dyspnea scale to guide intensity level when exercising independently Short Term: Able to use Dyspnea scale daily in rehab to express subjective sense of shortness of breath during exertion;Long Term: Able to use Dyspnea scale to guide intensity level when exercising independently Short Term: Able to use Dyspnea scale daily in rehab to express subjective sense of shortness of breath during exertion;Long Term: Able to use Dyspnea scale to guide intensity level when exercising independently Short Term: Able to use Dyspnea scale daily in rehab to express subjective sense of shortness of breath during exertion;Long Term: Able to use Dyspnea scale to guide intensity level when exercising independently    Knowledge and understanding of Target Heart Rate Range (THRR) Yes Yes Yes Yes    Intervention Provide education and explanation of THRR including how the numbers were predicted and where they are located for reference Provide education and explanation of THRR including how  the numbers were predicted and where they are located for reference Provide education and explanation of THRR including how the numbers were predicted and where they are located for reference Provide education and explanation of THRR including how the numbers were predicted and where they are located for reference    Expected Outcomes Short Term: Able to state/look up THRR;Long Term: Able to use THRR to govern intensity when exercising independently;Short Term: Able to use daily as guideline for intensity in rehab Short Term: Able to state/look up THRR;Long Term: Able to use THRR to govern intensity when exercising independently;Short Term: Able to use daily as guideline for intensity in rehab Short Term: Able to state/look up THRR;Long Term: Able to use THRR to govern intensity when exercising independently;Short Term: Able to use daily as guideline  for intensity in rehab Short Term: Able to state/look up THRR;Long Term: Able to use THRR to govern intensity when exercising independently;Short Term: Able to use daily as guideline for intensity in rehab    Understanding of Exercise Prescription Yes Yes Yes Yes    Intervention Provide education, explanation, and written materials on patient's individual exercise prescription Provide education, explanation, and written materials on patient's individual exercise prescription Provide education, explanation, and written materials on patient's individual exercise prescription Provide education, explanation, and written materials on patient's individual exercise prescription    Expected Outcomes Short Term: Able to explain program exercise prescription;Long Term: Able to explain home exercise prescription to exercise independently Short Term: Able to explain program exercise prescription;Long Term: Able to explain home exercise prescription to exercise independently Short Term: Able to explain program exercise prescription;Long Term: Able to explain home exercise  prescription to exercise independently Short Term: Able to explain program exercise prescription;Long Term: Able to explain home exercise prescription to exercise independently             Exercise Goals Re-Evaluation:  Exercise Goals Re-Evaluation     Row Name 03/06/22 0851 04/01/22 0905 04/25/22 0933         Exercise Goal Re-Evaluation   Exercise Goals Review Increase Physical Activity;Increase Strength and Stamina;Able to understand and use rate of perceived exertion (RPE) scale;Able to understand and use Dyspnea scale;Knowledge and understanding of Target Heart Rate Range (THRR);Understanding of Exercise Prescription Increase Physical Activity;Increase Strength and Stamina;Able to understand and use rate of perceived exertion (RPE) scale;Able to understand and use Dyspnea scale;Knowledge and understanding of Target Heart Rate Range (THRR);Understanding of Exercise Prescription Increase Physical Activity;Increase Strength and Stamina;Able to understand and use rate of perceived exertion (RPE) scale;Able to understand and use Dyspnea scale;Knowledge and understanding of Target Heart Rate Range (THRR);Understanding of Exercise Prescription     Comments Pt is scheduled to begin exercise this week. Will monitor and progress as able. Pt has completed 7 exercise sessions and has not missed any. He is exercising on the nustep for 30 min, level 2, and has progressed to 1.6 METs. He is now tolerating exercise well, HR is stable. He performs warm up and cool down standing.  Will monitor and progress as able. Will discuss home exercise soon. Pt has completed 14 exercise sessions and has missed 1 session. He is exercising on the nustep for 15 min, level 3, and averages 1.6 METs. He is also now walking the track for 15 min, 6-7 laps, max 1.81 METs. He is  tolerating exercise well although does not substantially increase his METs. HR is stable. He performs warm up and cool down standing.  He was recently  encouraged to try walking at home. He is daily performing warm up and cool down exercises at home. Will monitor and progress as able.     Expected Outcomes Through exercise at rehab and home, the patient will decrease shortness of breath with daily activities and feel confident in carrying out an exercise regimen at home. Through exercise at rehab and home, the patient will decrease shortness of breath with daily activities and feel confident in carrying out an exercise regimen at home. Through exercise at rehab and home, the patient will decrease shortness of breath with daily activities and feel confident in carrying out an exercise regimen at home.              Nutrition & Weight - Outcomes:  Pre Biometrics - 03/01/22 0045  Pre Biometrics   Grip Strength 20 kg              Nutrition:  Nutrition Therapy & Goals - 04/23/22 1612       Nutrition Therapy   Diet Heart Healthy Diet      Personal Nutrition Goals   Nutrition Goal Patient to identify strategies for weight gain of 0.5-2.0# per week of weight gain    Personal Goal #2 Patient to choose a daily variety of fruits, vegetables, whole grains, lean protein/plant protein, and dairy as part of heart healthy lifestyle    Comments Goals in action. Jolan continues drinking Boost (530kcals, 22g protein) for breakfast, homemade smoothie mid-da7 with protein powder and peanut butter, and Ensure Complete (350kcals, 30g protein) daily. He has been struggling to tolerate the Boost 530kcals. We replacing the Boost product with Ensure 350kcal up to 3x per day to aid with meeting calorie needs. He is awaiting prior authorization for marinol and will transition from mirtazapine to marinol to aid with weight gain. He will follow-up with [facility] transplant team in November.      Intervention Plan   Intervention Prescribe, educate and counsel regarding individualized specific dietary modifications aiming towards targeted core components such  as weight, hypertension, lipid management, diabetes, heart failure and other comorbidities.;Nutrition handout(s) given to patient.    Expected Outcomes Short Term Goal: Understand basic principles of dietary content, such as calories, fat, sodium, cholesterol and nutrients.;Long Term Goal: Adherence to prescribed nutrition plan.             Nutrition Discharge:  Nutrition Assessments - 05/09/22 0950       Rate Your Plate Scores   Post Score 52             Education Questionnaire Score:  Knowledge Questionnaire Score - 05/02/22 1512       Knowledge Questionnaire Score   Post Score 17/18             Goals reviewed with patient; copy given to patient.

## 2022-05-17 DIAGNOSIS — Z23 Encounter for immunization: Secondary | ICD-10-CM | POA: Diagnosis not present

## 2022-05-20 ENCOUNTER — Telehealth: Payer: Self-pay | Admitting: Internal Medicine

## 2022-05-20 NOTE — Telephone Encounter (Signed)
I received a phone call from Merita Norton, Benefits coordinator at [employer] (the patient's daughter's employer).  Patient's daughter, Liliana Brentlinger, has asked for additional FMLA time off for appointments.  I let Ms. Watts know patient has completed his pulmonary rehab and that his next appointment with [facility] will be in Jan or Feb and every 3 months after that for follow-up, and the employee would probably need 4-6 hours off for the appointments.  She said she would call Ms. Lissa Merlin back for further details.

## 2022-05-21 DIAGNOSIS — Z79899 Other long term (current) drug therapy: Secondary | ICD-10-CM | POA: Diagnosis not present

## 2022-05-21 DIAGNOSIS — M0579 Rheumatoid arthritis with rheumatoid factor of multiple sites without organ or systems involvement: Secondary | ICD-10-CM | POA: Diagnosis not present

## 2022-05-21 DIAGNOSIS — Z681 Body mass index (BMI) 19 or less, adult: Secondary | ICD-10-CM | POA: Diagnosis not present

## 2022-05-21 DIAGNOSIS — R5383 Other fatigue: Secondary | ICD-10-CM | POA: Diagnosis not present

## 2022-05-21 DIAGNOSIS — J849 Interstitial pulmonary disease, unspecified: Secondary | ICD-10-CM | POA: Diagnosis not present

## 2022-06-03 ENCOUNTER — Telehealth: Payer: Self-pay | Admitting: Emergency Medicine

## 2022-06-03 ENCOUNTER — Telehealth: Payer: Self-pay | Admitting: Internal Medicine

## 2022-06-03 NOTE — Telephone Encounter (Signed)
Caller & Relationship to patient: PT  Call back number:   Date of last office visit: 04/30/2022  Date of next office visit: 11/04/2022  Medication(s) to be refilled:  meloxicam (MOBIC) 7.5 MG tablet       Preferred Pharmacy:   [facility] [address], [place]

## 2022-06-03 NOTE — Telephone Encounter (Signed)
Called and spoke with pt stating to him if he wanted to extend his leave, he needed to schedule an appt to have this discussed and to see if the provider would be okay writing a letter to have his leave extended. Pt verbalized understanding. Appt scheduled for pt. Nothing further needed.

## 2022-06-04 ENCOUNTER — Telehealth: Payer: Self-pay | Admitting: Internal Medicine

## 2022-06-04 MED ORDER — MELOXICAM 7.5 MG PO TABS: 7.5000 mg | ORAL_TABLET | Freq: Every day | ORAL | 0 refills | Status: DC | PRN

## 2022-06-04 NOTE — Telephone Encounter (Signed)
Called patient to inform him that this medication needs to be refilled by the provider that prescribed it. Patient will call his pulmonologist office to request a refill

## 2022-06-04 NOTE — Telephone Encounter (Signed)
Yes can refill x 1 , needs to come from Rheumatology that is managing his RA for future refills

## 2022-06-04 NOTE — Telephone Encounter (Signed)
Previous FMLA paperwork was signed by Dr. Shearon Stalls on 02/22/22 and stated he would need FMLA through the Fall Semester 2023 and potentially lifelong.  A copy of the form is available if needed.

## 2022-06-04 NOTE — Telephone Encounter (Signed)
Patient called to request a refill for his Meloxicam.  Script should be sent to the [facility] in [place].  Please advise.  CB# [phone]

## 2022-06-04 NOTE — Telephone Encounter (Signed)
Called and spoke with pt who is requesting a refill of his meloxicam.let him know that Dr. Shearon Stalls was not available and stated to him that I would send this to another provider for them to review to see if they would be okay refilling med.  Did state to pt that he may need to call PCP to have them handle this med refill.  Tammy, please advise on this for pt if you are okay with [place] refilling pt's meloxicam or if PCP needs to handle this.

## 2022-06-04 NOTE — Telephone Encounter (Signed)
Called and spoke with pt letting him know info per TP and he verbalized understanding. Rx has been sent to preferred pharmacy. Nothing further needed.

## 2022-06-07 ENCOUNTER — Emergency Department (HOSPITAL_BASED_OUTPATIENT_CLINIC_OR_DEPARTMENT_OTHER): Payer: Medicare Other

## 2022-06-07 ENCOUNTER — Other Ambulatory Visit: Payer: Self-pay

## 2022-06-07 ENCOUNTER — Emergency Department (HOSPITAL_BASED_OUTPATIENT_CLINIC_OR_DEPARTMENT_OTHER)
Admission: EM | Admit: 2022-06-07 | Discharge: 2022-06-07 | Disposition: A | Discharge status: Home / Self Care | Payer: Medicare Other | Attending: Emergency Medicine | Admitting: Emergency Medicine

## 2022-06-07 ENCOUNTER — Encounter (HOSPITAL_BASED_OUTPATIENT_CLINIC_OR_DEPARTMENT_OTHER): Payer: Self-pay | Admitting: Emergency Medicine

## 2022-06-07 DIAGNOSIS — R000 Tachycardia, unspecified: Secondary | ICD-10-CM | POA: Insufficient documentation

## 2022-06-07 DIAGNOSIS — R072 Precordial pain: Secondary | ICD-10-CM | POA: Diagnosis not present

## 2022-06-07 DIAGNOSIS — J8410 Pulmonary fibrosis, unspecified: Secondary | ICD-10-CM | POA: Diagnosis not present

## 2022-06-07 DIAGNOSIS — N281 Cyst of kidney, acquired: Secondary | ICD-10-CM | POA: Diagnosis not present

## 2022-06-07 DIAGNOSIS — R110 Nausea: Secondary | ICD-10-CM | POA: Insufficient documentation

## 2022-06-07 DIAGNOSIS — R079 Chest pain, unspecified: Secondary | ICD-10-CM | POA: Diagnosis not present

## 2022-06-07 DIAGNOSIS — Z0389 Encounter for observation for other suspected diseases and conditions ruled out: Secondary | ICD-10-CM | POA: Diagnosis not present

## 2022-06-07 DIAGNOSIS — R109 Unspecified abdominal pain: Secondary | ICD-10-CM | POA: Diagnosis present

## 2022-06-07 DIAGNOSIS — Z1152 Encounter for screening for COVID-19: Secondary | ICD-10-CM | POA: Insufficient documentation

## 2022-06-07 DIAGNOSIS — R1012 Left upper quadrant pain: Secondary | ICD-10-CM | POA: Insufficient documentation

## 2022-06-07 DIAGNOSIS — R197 Diarrhea, unspecified: Secondary | ICD-10-CM | POA: Diagnosis not present

## 2022-06-07 DIAGNOSIS — R0602 Shortness of breath: Secondary | ICD-10-CM | POA: Diagnosis not present

## 2022-06-07 DIAGNOSIS — R748 Abnormal levels of other serum enzymes: Secondary | ICD-10-CM | POA: Insufficient documentation

## 2022-06-07 DIAGNOSIS — R0789 Other chest pain: Secondary | ICD-10-CM | POA: Diagnosis not present

## 2022-06-07 LAB — LIPASE, BLOOD: Lipase: 52 U/L — ABNORMAL HIGH (ref 11–51)

## 2022-06-07 LAB — CBC
HCT: 43.2 % (ref 39.0–52.0)
Hemoglobin: 13.4 g/dL (ref 13.0–17.0)
MCH: 27.2 pg (ref 26.0–34.0)
MCHC: 31 g/dL (ref 30.0–36.0)
MCV: 87.8 fL (ref 80.0–100.0)
Platelets: 207 10*3/uL (ref 150–400)
RBC: 4.92 MIL/uL (ref 4.22–5.81)
RDW: 13.8 % (ref 11.5–15.5)
WBC: 5.7 10*3/uL (ref 4.0–10.5)
nRBC: 0 % (ref 0.0–0.2)

## 2022-06-07 LAB — COMPREHENSIVE METABOLIC PANEL
ALT: 41 U/L (ref 0–44)
AST: 34 U/L (ref 15–41)
Albumin: 4.1 g/dL (ref 3.5–5.0)
Alkaline Phosphatase: 64 U/L (ref 38–126)
Anion gap: 10 (ref 5–15)
BUN: 18 mg/dL (ref 8–23)
CO2: 31 mmol/L (ref 22–32)
Calcium: 9.4 mg/dL (ref 8.9–10.3)
Chloride: 104 mmol/L (ref 98–111)
Creatinine, Ser: 1.08 mg/dL (ref 0.61–1.24)
GFR, Estimated: >60 mL/min (ref >60)
Glucose, Bld: 134 mg/dL — ABNORMAL HIGH (ref 70–99)
Potassium: 3.7 mmol/L (ref 3.5–5.1)
Sodium: 145 mmol/L (ref 135–145)
Total Bilirubin: 0.8 mg/dL (ref 0.3–1.2)
Total Protein: 7.8 g/dL (ref 6.5–8.1)

## 2022-06-07 LAB — RESP PANEL BY RT-PCR (FLU A&B, COVID) ARPGX2
Influenza A by PCR: NEGATIVE
Influenza B by PCR: NEGATIVE
SARS Coronavirus 2 by RT PCR: NEGATIVE

## 2022-06-07 LAB — BRAIN NATRIURETIC PEPTIDE: B Natriuretic Peptide: 24.8 pg/mL (ref 0.0–100.0)

## 2022-06-07 LAB — TROPONIN I (HIGH SENSITIVITY)
Troponin I (High Sensitivity): 3 ng/L (ref <18)
Troponin I (High Sensitivity): 3 ng/L (ref <18)

## 2022-06-07 MED ORDER — SODIUM CHLORIDE 0.9 % IV BOLUS
500.0000 mL | Freq: Once | INTRAVENOUS | Status: AC
  Administered 2022-06-07: 500 mL via INTRAVENOUS

## 2022-06-07 MED ORDER — LOPERAMIDE HCL 2 MG PO CAPS: 2.0000 mg | ORAL_CAPSULE | Freq: Four times a day (QID) | ORAL | 0 refills | Status: DC | PRN

## 2022-06-07 MED ORDER — DICYCLOMINE HCL 20 MG PO TABS: 20.0000 mg | ORAL_TABLET | Freq: Three times a day (TID) | ORAL | 0 refills | Status: DC | PRN

## 2022-06-07 MED ORDER — IOHEXOL 300 MG/ML  SOLN
80.0000 mL | Freq: Once | INTRAMUSCULAR | Status: AC | PRN
  Administered 2022-06-07: 80 mL via INTRAVENOUS

## 2022-06-07 MED ORDER — ONDANSETRON 4 MG PO TBDP: 4.0000 mg | ORAL_TABLET | Freq: Three times a day (TID) | ORAL | 0 refills | Status: DC | PRN

## 2022-06-07 NOTE — Discharge Instructions (Addendum)

## 2022-06-07 NOTE — ED Notes (Signed)
Spoke with Camilla in lab to add on Trop and BNP

## 2022-06-07 NOTE — ED Notes (Signed)
ED Provider at bedside. 

## 2022-06-07 NOTE — ED Notes (Signed)
Patient transported to X-ray 

## 2022-06-07 NOTE — ED Triage Notes (Signed)
Mid  abdominal cramping x 1 week , denies NV. Intermittent diarrhea x 1 week .  Pt om continuous oxygen  3 L [place]

## 2022-06-07 NOTE — ED Provider Notes (Signed)
Emergency Department Provider Note   I have reviewed the triage vital signs and the nursing notes.   HISTORY  Chief Complaint Abdominal Pain   HPI Buell Parcel is a 71 y.o. male presents to the emergency department evaluation of left side abdominal cramping pain with radiation of symptoms up into the chest.  He has associated diarrhea which has been intermittent without blood or fever.  No vomiting or nausea.  He is on home oxygen continuously for interstitial lung disease.  No recent antibiotics.  Denies fevers or shaking chills.  Has had significant worsening pain starting today and with the worsening pain felt some increased shortness of breath.  No sharp/stabbing/ripping pain to the back.  No pleuritic chest discomfort. No increase in O2 requirement at home.   Past Medical History:  Diagnosis Date   Abnormal weight loss 12/04/2021   Acute interstitial pneumonitis ([place]) 12/04/2021   Aortic atherosclerosis ([place]) 12/04/2021   Arthralgia of temporomandibular joint 12/04/2021   Arthritis    BMI 26.0-26.9,adult 10/22/2016   Chronic fatigue syndrome 12/04/2021   Dyslipidemia 03/10/2012   Family history of prostate cancer 12/04/2021   Glucose intolerance (impaired glucose tolerance)    Hyperlipidemia    Interstitial lung disease ([place]) 07/16/2019   Shaheed Schmuck-term current use of high risk medication other than anticoagulant 07/16/2019   Other Gary Bultman term (current) drug therapy 12/04/2021   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 ENT: No sore throat. Cardiovascular: Positive chest pain. Respiratory: Mild increase shortness of breath. Gastrointestinal: Positive left sided abdominal pain.  No nausea, no  vomiting. Positive diarrhea.  No constipation. Genitourinary: Negative for dysuria. Musculoskeletal: Negative for back pain. Skin: Negative for rash. Neurological: Negative for headaches, focal weakness or numbness.  ____________________________________________   PHYSICAL EXAM:  VITAL SIGNS: ED Triage Vitals  Enc Vitals Group     BP 06/07/22 1604 103/65     Pulse Rate 06/07/22 1604 (!) 109     Resp 06/07/22 1604 20     Temp 06/07/22 1604 (!) 96.4 F (35.8 C)     Temp src --      SpO2 06/07/22 1604 95 %     Weight 06/07/22 1603 99 lb 8 oz (45.1 kg)   Constitutional: Alert and oriented. Well appearing and in no acute distress. Eyes: Conjunctivae are normal. Head: Atraumatic. Nose: No congestion/rhinnorhea. Mouth/Throat: Mucous membranes are moist.  Neck: No stridor.   Cardiovascular: Tachycardia. Good peripheral circulation. Grossly normal heart sounds.   Respiratory: Increased respiratory effort.  No retractions. Lungs with crackles bilaterally. No wheezing.  Gastrointestinal: Soft with mild left sided tenderness. No peritonitis. No distention.  Musculoskeletal: No lower extremity tenderness nor edema. No gross deformities of extremities. Neurologic:  Normal speech and language.  Skin:  Skin is warm, dry and intact. No rash noted.  ____________________________________________   LABS (all labs ordered are listed, but only abnormal results are displayed)  Labs Reviewed  LIPASE, BLOOD - Abnormal; Notable for the following components:      Result Value   Lipase 52 (*)    All other components within normal limits  COMPREHENSIVE METABOLIC PANEL - Abnormal; Notable for the following components:   Glucose, Bld 134 (*)    All other components within  normal limits  RESP PANEL BY RT-PCR (FLU A&B, COVID) ARPGX2  CBC  URINALYSIS, ROUTINE W REFLEX MICROSCOPIC  BRAIN NATRIURETIC PEPTIDE  TROPONIN I (HIGH SENSITIVITY)   ____________________________________________  EKG   EKG  Interpretation  Date/Time:    Ventricular Rate:    PR Interval:    QRS Duration:   QT Interval:    QTC Calculation:   R Axis:     Text Interpretation:          ____________________________________________  RADIOLOGY  DG Chest 2 View  Result Date: 06/07/2022 CLINICAL DATA:  Chest pain.  Interstitial lung disease EXAM: CHEST - 2 VIEW COMPARISON:  12/29/2021 FINDINGS: Heart size is normal. Chronic findings of interstitial lung disease and pulmonary fibrosis. No definite superimposed airspace consolidation. No pleural effusion or pneumothorax. IMPRESSION: Chronic findings of interstitial lung disease and pulmonary fibrosis. No definite superimposed airspace consolidation. Electronically Signed   By: Davina Poke D.O.   On: 06/07/2022 17:37    ____________________________________________   PROCEDURES  Procedure(s) performed:   Procedures   ____________________________________________   INITIAL IMPRESSION / ASSESSMENT AND PLAN / ED COURSE  Pertinent labs & imaging results that were available during my care of the patient were reviewed by me and considered in my medical decision making (see chart for details).   This patient is Presenting for Evaluation of abdominal pain/CP, which does require a range of treatment options, and is a complaint that involves a high risk of morbidity and mortality.  The Differential Diagnoses includes but is not exclusive to acute cholecystitis, intrathoracic causes for epigastric abdominal pain, gastritis, duodenitis, pancreatitis, small bowel or large bowel obstruction, abdominal aortic aneurysm, hernia, gastritis, etc.   Critical Interventions-    Medications - No data to display  Reassessment after intervention:     I did obtain Additional Historical Information from wife at bedside.  I decided to review pertinent External Data, and in summary patient is followed by the [facility] transplant team.  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  Clinically patient's lung disease is stable and team there recommends nutrition status and follow-up in 3 months.  That appointment was on 11/15.   Clinical Laboratory Tests Ordered, included CBC without leukocytosis.  Creatinine normal.  Normal LFTs and bilirubin.  Lipase mildly elevated to 52.  Troponin normal.  Radiologic Tests Ordered, included ***. I independently interpreted the images and agree with radiology interpretation.   Cardiac Monitor Tracing which shows tachycardia.   Social Determinants of Health Risk ***  Consult complete with  Medical Decision Making: Summary:  Patient presents emergency department left side abdominal pain and intermittent diarrhea.  Describes some radiation of pain up into the chest but EKG is reassuring and troponin normal.  Exceedingly low suspicion for PE.  He has chronic respiratory disease related to his interstitial lung disease and RA.  He is on methotrexate.  No clear infection symptoms.  Pain seems to be originating from the left abdomen which is reproducible on exam.  Plan for CT abdomen pelvis and reassess.  Reevaluation with update and discussion with   ***Considered admission***  Disposition:   ____________________________________________  FINAL CLINICAL IMPRESSION(S) / ED DIAGNOSES  Final diagnoses:  None     NEW OUTPATIENT MEDICATIONS STARTED DURING THIS VISIT:  New Prescriptions   No medications on file    Note:  This document was prepared using Dragon voice recognition software and may include unintentional dictation errors.  Nanda Quinton,  MD, [facility] Emergency Medicine

## 2022-06-17 ENCOUNTER — Encounter: Payer: Self-pay | Admitting: Adult Health

## 2022-06-17 ENCOUNTER — Ambulatory Visit (INDEPENDENT_AMBULATORY_CARE_PROVIDER_SITE_OTHER): Payer: Medicare Other | Admitting: Adult Health

## 2022-06-17 VITALS — BP 110/76 | HR 101 | Temp 97.6°F | Ht 65.0 in | Wt 99.0 lb

## 2022-06-17 DIAGNOSIS — M0510 Rheumatoid lung disease with rheumatoid arthritis of unspecified site: Secondary | ICD-10-CM | POA: Diagnosis not present

## 2022-06-17 DIAGNOSIS — J849 Interstitial pulmonary disease, unspecified: Secondary | ICD-10-CM

## 2022-06-17 DIAGNOSIS — E43 Unspecified severe protein-calorie malnutrition: Secondary | ICD-10-CM

## 2022-06-17 DIAGNOSIS — J42 Unspecified chronic bronchitis: Secondary | ICD-10-CM

## 2022-06-17 DIAGNOSIS — R634 Abnormal weight loss: Secondary | ICD-10-CM | POA: Diagnosis not present

## 2022-06-17 HISTORY — DX: Unspecified severe protein-calorie malnutrition: E43

## 2022-06-17 HISTORY — DX: Unspecified chronic bronchitis: J42

## 2022-06-17 MED ORDER — ALBUTEROL SULFATE HFA 108 (90 BASE) MCG/ACT IN AERS: 1.0000 | INHALATION_SPRAY | Freq: Four times a day (QID) | RESPIRATORY_TRACT | 5 refills | Status: DC | PRN

## 2022-06-17 NOTE — Progress Notes (Signed)
_0  ID: Earl Thomas, male    DOB: [birth year], 71 y.o.   MRN: [id number]  Chief Complaint  Patient presents with   Follow-up    Referring provider: Horald Pollen, *  HPI: 71 year old male followed for rheumatoid arthritis related ILD. Unable to tolerate Esbriet or Ofev due to progressive weight loss and GI issues. Patient is followed by rheumatology on Rituxan and methotrexate   TEST/EVENTS :  ILD :  Intolerant to Esbriet and Ofev- GI issues , severe weight loss.  [facility] lung transplant evaluation     06/17/2022 Follow up : RA -ILD, O2 RF  Patient is for a 22-monthfollow-up.  Patient has underlying rheumatoid arthritis related interstitial lung disease and chronic respiratory failure on oxygen.  He is currently on oxygen 3 L.  Patient says he continues to get short of breath with minimum activities.  Has low stamina and activity tolerance.  Has daily cough that is nonproductive. Patient has had ongoing GI issues with swallowing decreased appetite anorexia and weight loss. Modified barium swallow April 26, 2022 demonstrated normal oropharyngeal abilities considered a mild aspiration risk.  CT abdomen and pelvis without acute etiology for abdominal pain and weight loss Has an appointment with [facility] transplant evaluation next month with a 5-day evaluation plan.  Continues to follow-up with Dr. HLenna Gilfordwith rheumatology.  Is getting Rituxan every 6 months.  Last infusion was September 2023.  Remains on methotrexate 10 mg weekly.  Says overall joint pain is well-controlled.  No longer on prednisone last used in August 2023.  Patient has completed pulmonary rehab.  Patient is trying to continue to work on weight gain.  Is using boost twice daily.  And a high-calorie smoothie daily.  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  Also significant GI issues with weight loss and weakness.       No Known  Allergies  Immunization History  Administered Date(s) Administered   Fluad Quad(high Dose 65+) 04/13/2020, 04/18/2021, 03/11/2022   Hep A / Hep B 02/12/2022   Influenza, High Dose Seasonal PF 04/24/2019   Influenza,inj,Quad PF,6+ Mos 05/30/2015, 04/23/2016, 05/14/2017, 03/25/2018   Influenza-Unspecified 04/07/2014   PFIZER(Purple Top)SARS-COV-2 Vaccination 07/30/2019, 08/20/2019, 04/27/2020   PNEUMOCOCCAL CONJUGATE-20 12/25/2020   Pfizer Covid-19 Vaccine Bivalent Booster 121yr& up 04/24/2021   Pneumococcal Conjugate-13 05/18/2018   Pneumococcal Polysaccharide-23 06/09/2019   Td 03/10/2021   Tdap 02/13/2009, 05/14/2017   Zoster Recombinat (Shingrix) 05/27/2018, 01/19/2019   Zoster, Live 04/12/2014    Past Medical History:  Diagnosis Date   Abnormal weight loss 12/04/2021   Acute interstitial pneumonitis ([facility]5/30/2023   Aortic atherosclerosis ([facility]5/30/2023   Arthralgia of temporomandibular joint 12/04/2021   Arthritis    BMI 26.0-26.9,adult 10/22/2016   Chronic fatigue syndrome 12/04/2021   Dyslipidemia 03/10/2012   Family history of prostate cancer 12/04/2021   Glucose intolerance (impaired glucose tolerance)    Hyperlipidemia    Interstitial lung disease ([facility]1/02/2020   Long-term current use of high risk medication other than anticoagulant 07/16/2019   Other long term (current) drug therapy 12/04/2021   Parietoalveolar pneumopathy ([facility]5/30/2023   Pituitary macroadenoma ([facility]3/31/2008   s/p NS consultation/[facility].   Renal insufficiency 04/29/2016   Rheumatoid lung disease with rheumatoid arthritis ([facility]1/02/2020   Skin lesion of chest wall 12/04/2021    Tobacco History: Social History   Tobacco Use  Smoking Status Former   Types: Pipe  Quit date: 10/06/1988   Years since quitting: 33.7   Passive exposure: Never  Smokeless Tobacco Never  Tobacco Comments   parent smoked in home as a child.  Smoked a pip in college, not daily.   Counseling given: Not  Answered Tobacco comments: parent smoked in home as a child.  Smoked a pip in college, not daily.   Outpatient Medications Prior to Visit  Medication Sig Dispense Refill   acetaminophen (TYLENOL) 500 MG tablet Take 1,000 mg by mouth every 6 (six) hours as needed for headache (pain).     ascorbic acid (VITAMIN C) 500 MG tablet Take 500 mg by mouth daily.     aspirin EC 81 MG tablet Take 81 mg by mouth every morning.     atorvastatin (LIPITOR) 10 MG tablet Take 1 tablet (10 mg total) by mouth daily. 90 tablet 3   atorvastatin (LIPITOR) 20 MG tablet Take 20 mg by mouth daily.     azelastine (ASTELIN) 0.1 % nasal spray Place 2 sprays into both nostrils 2 (two) times daily. Use in each nostril as directed (Patient taking differently: Place 2 sprays into both nostrils 2 (two) times daily as needed for rhinitis or allergies (congestion). Use in each nostril as directed) 30 mL 12   azelastine (ASTELIN) 0.1 % nasal spray Place 2 sprays into both nostrils 2 (two) times daily.     Brimonidine Tartrate (LUMIFY) 0.025 % SOLN Place 1 drop into both eyes every Sunday.     cetirizine (ZYRTEC) 10 MG tablet Take 10 mg by mouth daily.     diclofenac sodium (VOLTAREN) 1 % GEL Apply 2 g topically 4 (four) times daily. (Patient taking differently: Apply 2 g topically 4 (four) times daily as needed (pain).) 100 g 1   dicyclomine (BENTYL) 20 MG tablet Take 1 tablet (20 mg total) by mouth 3 (three) times daily as needed. 20 tablet 0   dronabinol (MARINOL) 2.5 MG capsule Take 1 capsule (2.5 mg total) by mouth 2 (two) times daily before a meal. 60 capsule 3   famotidine (PEPCID) 20 MG tablet Take 1 tablet (20 mg total) by mouth 2 (two) times daily. 90 tablet 3   folic acid ([name]) 1 MG tablet Take 1 mg by mouth every morning.     guaifenesin (ROBITUSSIN) 100 MG/5ML syrup Take 200 mg by mouth 3 (three) times daily as needed for cough or congestion.     hydrocortisone (PROCTOZONE-HC) 2.5 % rectal cream Place 1 Application  rectally 2 (two) times daily. 30 g 0   levocetirizine (XYZAL) 5 MG tablet Take 1 tablet (5 mg total) by mouth every evening. 30 tablet 5   loperamide (IMODIUM) 2 MG capsule Take 1 capsule (2 mg total) by mouth 4 (four) times daily as needed for diarrhea or loose stools. 12 capsule 0   meloxicam (MOBIC) 7.5 MG tablet Take 1 tablet (7.5 mg total) by mouth daily as needed for pain. 30 tablet 0   methotrexate (RHEUMATREX) 10 MG tablet Take 1 tablet (10 mg total) by mouth once a week. Caution: Chemotherapy. Protect from light. 4 tablet 2   mirtazapine (REMERON) 15 MG tablet Take 0.5 tablets (7.5 mg total) by mouth at bedtime. 30 tablet 5   Naphazoline HCl (CLEAR EYES OP) Place 1 drop into both eyes daily.     ondansetron (ZOFRAN-ODT) 4 MG disintegrating tablet Take 1 tablet (4 mg total) by mouth every 8 (eight) hours as needed. 20 tablet 0   predniSONE (DELTASONE) 10 MG  tablet Take 6 tabs (29m) by mouth daily for 1 week, followed by 5 tabs daily for 1 week, then 4 tabs daily for 1 week, then 3 tabs daily, Pulmonary physician to titrate. 132 tablet 0   riTUXimab (RITUXAN IV) Inject into the vein.     sulfamethoxazole-trimethoprim (BACTRIM DS) 800-160 MG tablet Take 1 pill on Monday, Wednesday, and Friday of each week 15 tablet 0   SYMBICORT 80-4.5 MCG/ACT inhaler Inhale 2 puffs by mouth twice daily (Patient taking differently: Inhale 2 puffs into the lungs 2 (two) times daily as needed (shortness of breath/wheezing).) 11 g 0   vitamin C (ASCORBIC ACID) 500 MG tablet Take 500 mg by mouth daily after supper.     albuterol (VENTOLIN HFA) 108 (90 Base) MCG/ACT inhaler Inhale 2 puffs into the lungs every 6 (six) hours as needed for wheezing or shortness of breath. 18 g 5   nitroGLYCERIN (NITROSTAT) 0.4 MG SL tablet Place 1 tablet (0.4 mg total) under the tongue every 5 (five) minutes as needed. (Patient taking differently: Place 0.4 mg under the tongue every 5 (five) minutes as needed for chest pain.) 25 tablet  6   No facility-administered medications prior to visit.     Review of Systems:   Constitutional:   No  weight loss, night sweats,  Fevers, chills,  +fatigue, or  lassitude.  HEENT:   No headaches,  Difficulty swallowing,  Tooth/dental problems, or  Sore throat,                No sneezing, itching, ear ache, nasal congestion, post nasal drip,   CV:  No chest pain,  Orthopnea, PND, swelling in lower extremities, anasarca, dizziness, palpitations, syncope.   GI  No , bloody stools.   Resp:.  No chest wall deformity  Skin: no rash or lesions.  GU: no dysuria, change in color of urine, no urgency or frequency.  No flank pain, no hematuria   MS:  No joint pain or swelling.  No decreased range of motion.  No back pain.    Physical Exam  BP 110/76 (BP Location: Left Arm, Cuff Size: Normal)   Pulse (!) 101   Temp 97.6 F (36.4 C) (Oral)   Ht _0  (1.651 m)   Wt 99 lb (44.9 kg)   SpO2 99%   BMI 16.47 kg/m   GEN: A/Ox3; pleasant , NAD, frail and elderly on oxygen   HEENT:  [place]/AT,  EACs-clear, TMs-wnl, NOSE-clear, THROAT-clear, no lesions, no postnasal drip or exudate noted.   NECK:  Supple w/ fair ROM; no JVD; normal carotid impulses w/o bruits; no thyromegaly or nodules palpated; no lymphadenopathy.    RESP bibasilar crackles   no accessory muscle use, no dullness to percussion  CARD:  RRR, no m/r/g, no peripheral edema, pulses intact, no cyanosis or clubbing.  GI:   Soft & nt; nml bowel sounds; no organomegaly or masses detected.   Musco: Warm bil, no deformities or joint swelling noted.   Neuro: alert, no focal deficits noted.    Skin: Warm, no lesions or rashes    Lab Results:  CBC    Component Value Date/Time   WBC 5.7 06/07/2022 1613   RBC 4.92 06/07/2022 1613   HGB 13.4 06/07/2022 1613   HGB 13.1 12/26/2021 1419   HGB 14.8 11/12/2017 1001   HCT 43.2 06/07/2022 1613   HCT 45.3 11/12/2017 1001   PLT 207 06/07/2022 1613   PLT 240 12/26/2021 1419    PLT  197 11/12/2017 1001   MCV 87.8 06/07/2022 1613   MCV 86 11/12/2017 1001   MCH 27.2 06/07/2022 1613   MCHC 31.0 06/07/2022 1613   RDW 13.8 06/07/2022 1613   RDW 14.5 11/12/2017 1001   LYMPHSABS 0.8 01/28/2022 1025   LYMPHSABS 1.7 11/12/2017 1001   MONOABS 0.6 01/28/2022 1025   EOSABS 0.2 01/28/2022 1025   EOSABS 0.2 11/12/2017 1001   BASOSABS 0.0 01/28/2022 1025   BASOSABS 0.0 11/12/2017 1001    BMET    Component Value Date/Time   NA 145 06/07/2022 1613   NA 139 07/16/2019 1005   K 3.7 06/07/2022 1613   CL 104 06/07/2022 1613   CO2 31 06/07/2022 1613   GLUCOSE 134 (H) 06/07/2022 1613   BUN 18 06/07/2022 1613   BUN 14 07/16/2019 1005   CREATININE 1.08 06/07/2022 1613   CREATININE 0.98 12/26/2021 1419   CREATININE 1.21 04/23/2016 1619   CALCIUM 9.4 06/07/2022 1613   GFRNONAA >60 06/07/2022 1613   GFRNONAA >60 12/26/2021 1419   GFRNONAA 60 01/11/2016 1431   GFRAA 97 07/16/2019 1005   GFRAA 70 01/11/2016 1431    BNP    Component Value Date/Time   BNP 24.8 06/07/2022 1613    ProBNP No results found for: "PROBNP"  Imaging:        Latest Ref Rng & Units 10/29/2021    3:00 PM 04/18/2021    3:05 PM 11/13/2020    3:07 PM 08/07/2020   10:04 AM 07/26/2019   10:56 AM  PFT Results  FVC-Pre L 1.22  1.71  1.71  1.37  1.52   FVC-Predicted Pre % 38  54  54  43  47   FVC-Post L     1.82   FVC-Predicted Post %     57   Pre FEV1/FVC % % 99  94  92  100  100   Post FEV1/FCV % %     88   FEV1-Pre L 1.21  1.61  1.57  1.37  1.52   FEV1-Predicted Pre % 50  67  65  57  63   FEV1-Post L     1.61   DLCO uncorrected ml/min/mmHg 8.80  10.08  11.16  9.68  11.79   DLCO UNC% % 39  45  49  42  52   DLCO corrected ml/min/mmHg 8.80   11.16  9.68    DLCO COR %Predicted % 39   49  42    DLVA Predicted % 55  71  104  73  82   TLC L     3.42   TLC % Predicted %     57   RV % Predicted %     84     No results found for: "NITRICOXIDE"      Assessment & Plan:   Interstitial  lung disease (HCC) RA related ILD.-Patient has significant clinical burden - He is undergoing lung transplant evaluation at [facility].  Has upcoming 5-day evaluation next month.  Will hold on high-resolution CT chest at this time.  If not done at [facility] next month, we will set up Patient has been unable to tolerate Esbriet or Ofev.  Currently on methotrexate and Rituxan  Plan  Patient Instructions  Call If CT scan of lungs not done in January at [facility] appointment .  Continue Symbicort 2 puffs Twice daily. Brush tongue and rinse mouth afterwards Continue Albuterol inhaler 2 puffs or 3 mL neb every 6 hours  as needed for shortness of breath or wheezing. Notify if symptoms persist despite rescue inhaler/neb use.  Continue Xyzal 1 tab daily for allergies Continue methotrexate , as previously prescribed Continue supplemental oxygen 3 lpm for goal>88-90%.   Mucinex 600 mg Twice daily for chest congestion/cough Flutter valve 2-3 times a day. Do this after your inhaler   Small, frequent high protein meals. Continue Ensure/Boost /smoothie supplements  Work paperwork .   Follow up in 2 months with Dr. Shearon Stalls or Marylin Lathon NP and As needed   Please contact office for sooner follow up if symptoms do not improve or worsen or seek emergency care       Chronic bronchitis ([place]) Chronic bronchitis continue on Symbicort.  Plan  Patient Instructions  Call If CT scan of lungs not done in January at [facility] appointment .  Continue Symbicort 2 puffs Twice daily. Brush tongue and rinse mouth afterwards Continue Albuterol inhaler 2 puffs or 3 mL neb every 6 hours as needed for shortness of breath or wheezing. Notify if symptoms persist despite rescue inhaler/neb use.  Continue Xyzal 1 tab daily for allergies Continue methotrexate , as previously prescribed Continue supplemental oxygen 3 lpm for goal>88-90%.   Mucinex 600 mg Twice daily for chest congestion/cough Flutter valve 2-3 times a day. Do this  after your inhaler   Small, frequent high protein meals. Continue Ensure/Boost /smoothie supplements  Work paperwork .   Follow up in 2 months with Dr. Shearon Stalls or Dysen Edmondson NP and As needed   Please contact office for sooner follow up if symptoms do not improve or worsen or seek emergency care       Protein-calorie malnutrition, severe ([place]) Severe protein calorie malnutrition with BMI at 16.  Patient is encouraged to continue on boost and high-calorie smoothie.  Abnormal weight loss Continue with ongoing evaluation and recent modified barium swallow and CT abdomen was unrevealing.  Continue on high calorie protein supplements and smoothies.     Rexene Edison, NP 06/17/2022

## 2022-06-17 NOTE — Patient Instructions (Addendum)
Call If CT scan of lungs not done in January at [facility] appointment .  Continue Symbicort 2 puffs Twice daily. Brush tongue and rinse mouth afterwards Continue Albuterol inhaler 2 puffs or 3 mL neb every 6 hours as needed for shortness of breath or wheezing. Notify if symptoms persist despite rescue inhaler/neb use.  Continue Xyzal 1 tab daily for allergies Continue methotrexate , as previously prescribed Continue supplemental oxygen 3 lpm for goal>88-90%.   Mucinex 600 mg Twice daily for chest congestion/cough Flutter valve 2-3 times a day. Do this after your inhaler   Small, frequent high protein meals. Continue Ensure/Boost /smoothie supplements  Work paperwork .   Follow up in 2 months with Earl Thomas or Earl Rochel NP and As needed   Please contact office for sooner follow up if symptoms do not improve or worsen or seek emergency care

## 2022-06-17 NOTE — Assessment & Plan Note (Signed)
Continue with ongoing evaluation and recent modified barium swallow and CT abdomen was unrevealing.  Continue on high calorie protein supplements and smoothies.

## 2022-06-17 NOTE — Assessment & Plan Note (Signed)
Chronic bronchitis continue on Symbicort.  Plan  Patient Instructions  Call If CT scan of lungs not done in January at [facility] appointment .  Continue Symbicort 2 puffs Twice daily. Brush tongue and rinse mouth afterwards Continue Albuterol inhaler 2 puffs or 3 mL neb every 6 hours as needed for shortness of breath or wheezing. Notify if symptoms persist despite rescue inhaler/neb use.  Continue Xyzal 1 tab daily for allergies Continue methotrexate , as previously prescribed Continue supplemental oxygen 3 lpm for goal>88-90%.   Mucinex 600 mg Twice daily for chest congestion/cough Flutter valve 2-3 times a day. Do this after your inhaler   Small, frequent high protein meals. Continue Ensure/Boost /smoothie supplements  Work paperwork .   Follow up in 2 months with Dr. Shearon Stalls or Jekhi Bolin NP and As needed   Please contact office for sooner follow up if symptoms do not improve or worsen or seek emergency care

## 2022-06-17 NOTE — Assessment & Plan Note (Signed)
Severe protein calorie malnutrition with BMI at 16.  Patient is encouraged to continue on boost and high-calorie smoothie.

## 2022-06-17 NOTE — Assessment & Plan Note (Signed)
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  Has upcoming 5-day evaluation next month.  Will hold on high-resolution CT chest at this time.  If not done at [facility] next month, we will set up Patient has been unable to tolerate Esbriet or Ofev.  Currently on methotrexate and Rituxan  Plan  Patient Instructions  Call If CT scan of lungs not done in January at [facility] appointment .  Continue Symbicort 2 puffs Twice daily. Brush tongue and rinse mouth afterwards Continue Albuterol inhaler 2 puffs or 3 mL neb every 6 hours as needed for shortness of breath or wheezing. Notify if symptoms persist despite rescue inhaler/neb use.  Continue Xyzal 1 tab daily for allergies Continue methotrexate , as previously prescribed Continue supplemental oxygen 3 lpm for goal>88-90%.   Mucinex 600 mg Twice daily for chest congestion/cough Flutter valve 2-3 times a day. Do this after your inhaler   Small, frequent high protein meals. Continue Ensure/Boost /smoothie supplements  Work paperwork .   Follow up in 2 months with Dr. Shearon Stalls or Pranathi Winfree NP and As needed   Please contact office for sooner follow up if symptoms do not improve or worsen or seek emergency care

## 2022-06-24 ENCOUNTER — Telehealth: Payer: Self-pay | Admitting: Adult Health

## 2022-06-24 NOTE — Telephone Encounter (Signed)
Patient dropped off disability extension paperwork during his 06/17/22 appt. with Rexene Edison, NP.  Spoke to patient on 12/15 and he said he needs the disability extension to be 07/08/22 - 12/06/22.  He asked that the signed form be emailed to [email or web address] - which is where he works.  Filled out form based on ov notes and the form completed by Dr. Shearon Stalls in Aug 2023 and sent it to be signed by TP.

## 2022-06-24 NOTE — Telephone Encounter (Signed)
PT asking you to ret his call re: family medical leave form you were to have fillled out and sent to [place]. [phone]

## 2022-06-25 NOTE — Telephone Encounter (Signed)
Tammy Parrett signed the FMLA extension form and I have emailed it to the patient's Human Resources office - Geryl Rankins  [email or web address].   I included the patient on that email.   HR rep is Geryl Rankins, ph# [phone]. Fax# [phone]

## 2022-07-07 ENCOUNTER — Other Ambulatory Visit: Payer: Self-pay | Admitting: Internal Medicine

## 2022-07-09 NOTE — Telephone Encounter (Signed)
Can we get this patient a follow up appointment?

## 2022-07-15 DIAGNOSIS — Z7682 Awaiting organ transplant status: Secondary | ICD-10-CM | POA: Diagnosis not present

## 2022-07-15 DIAGNOSIS — J8410 Pulmonary fibrosis, unspecified: Secondary | ICD-10-CM | POA: Diagnosis not present

## 2022-07-15 DIAGNOSIS — M0510 Rheumatoid lung disease with rheumatoid arthritis of unspecified site: Secondary | ICD-10-CM | POA: Diagnosis not present

## 2022-07-15 DIAGNOSIS — J984 Other disorders of lung: Secondary | ICD-10-CM | POA: Diagnosis not present

## 2022-07-15 DIAGNOSIS — J849 Interstitial pulmonary disease, unspecified: Secondary | ICD-10-CM | POA: Diagnosis not present

## 2022-07-16 DIAGNOSIS — J8410 Pulmonary fibrosis, unspecified: Secondary | ICD-10-CM | POA: Diagnosis not present

## 2022-07-16 DIAGNOSIS — Z7682 Awaiting organ transplant status: Secondary | ICD-10-CM | POA: Diagnosis not present

## 2022-07-16 DIAGNOSIS — R634 Abnormal weight loss: Secondary | ICD-10-CM | POA: Diagnosis not present

## 2022-07-16 DIAGNOSIS — Z01810 Encounter for preprocedural cardiovascular examination: Secondary | ICD-10-CM | POA: Diagnosis not present

## 2022-07-16 DIAGNOSIS — J849 Interstitial pulmonary disease, unspecified: Secondary | ICD-10-CM | POA: Diagnosis not present

## 2022-07-16 DIAGNOSIS — M0510 Rheumatoid lung disease with rheumatoid arthritis of unspecified site: Secondary | ICD-10-CM | POA: Diagnosis not present

## 2022-07-17 DIAGNOSIS — Z7682 Awaiting organ transplant status: Secondary | ICD-10-CM | POA: Diagnosis not present

## 2022-07-17 DIAGNOSIS — J849 Interstitial pulmonary disease, unspecified: Secondary | ICD-10-CM | POA: Diagnosis not present

## 2022-07-17 DIAGNOSIS — M0510 Rheumatoid lung disease with rheumatoid arthritis of unspecified site: Secondary | ICD-10-CM | POA: Diagnosis not present

## 2022-07-18 DIAGNOSIS — J849 Interstitial pulmonary disease, unspecified: Secondary | ICD-10-CM | POA: Diagnosis not present

## 2022-07-18 DIAGNOSIS — J984 Other disorders of lung: Secondary | ICD-10-CM | POA: Diagnosis not present

## 2022-07-18 DIAGNOSIS — K224 Dyskinesia of esophagus: Secondary | ICD-10-CM | POA: Diagnosis not present

## 2022-07-18 DIAGNOSIS — Z7682 Awaiting organ transplant status: Secondary | ICD-10-CM | POA: Diagnosis not present

## 2022-07-23 ENCOUNTER — Telehealth: Payer: Self-pay | Admitting: *Deleted

## 2022-07-23 NOTE — Telephone Encounter (Signed)
  Please see OV note 07/18/2022 Dr. Deatra Canter from [facility] hospital called the office and stated they are closing the referral for a lung transplant. Patient is not a candidate for lung transplant at the present time due to current nutritional states.   Recommends a peg tube for feeding.  Recommends to be referred to a nutritionist  due being underweight  If need to speak with provider please reach out to Dr. Deatra Canter at [facility].

## 2022-07-23 NOTE — Telephone Encounter (Signed)
Okay thank you for the update.  Please make an appointment with Dr. Shearon Stalls in February or March for a follow-up visit.

## 2022-07-24 NOTE — Telephone Encounter (Signed)
Spoke with patient. Advised Dr. Mauricio Po schedule is not out yet and recall has been placed already to get him scheduled in February. Nothing further needed.

## 2022-07-25 ENCOUNTER — Encounter: Payer: Self-pay | Admitting: Emergency Medicine

## 2022-07-29 ENCOUNTER — Other Ambulatory Visit: Payer: Self-pay | Admitting: Emergency Medicine

## 2022-08-05 ENCOUNTER — Other Ambulatory Visit: Payer: Self-pay | Admitting: Emergency Medicine

## 2022-08-05 ENCOUNTER — Other Ambulatory Visit: Payer: Self-pay | Admitting: *Deleted

## 2022-08-05 MED ORDER — BREO ELLIPTA 50-25 MCG/INH IN AEPB: 2.0000 | INHALATION_SPRAY | Freq: Two times a day (BID) | RESPIRATORY_TRACT | 2 refills | Status: DC

## 2022-08-05 NOTE — Telephone Encounter (Signed)
Use Breo Ellipta 50-25 please

## 2022-08-06 ENCOUNTER — Other Ambulatory Visit: Payer: Self-pay | Admitting: Emergency Medicine

## 2022-08-06 ENCOUNTER — Ambulatory Visit (INDEPENDENT_AMBULATORY_CARE_PROVIDER_SITE_OTHER): Payer: Medicare Other | Admitting: Emergency Medicine

## 2022-08-06 ENCOUNTER — Telehealth: Payer: Self-pay | Admitting: Emergency Medicine

## 2022-08-06 ENCOUNTER — Encounter: Payer: Self-pay | Admitting: Emergency Medicine

## 2022-08-06 VITALS — BP 110/66 | HR 106 | Temp 98.4°F | Ht 65.0 in | Wt 94.4 lb

## 2022-08-06 DIAGNOSIS — E291 Testicular hypofunction: Secondary | ICD-10-CM

## 2022-08-06 DIAGNOSIS — I5032 Chronic diastolic (congestive) heart failure: Secondary | ICD-10-CM

## 2022-08-06 DIAGNOSIS — M0510 Rheumatoid lung disease with rheumatoid arthritis of unspecified site: Secondary | ICD-10-CM

## 2022-08-06 DIAGNOSIS — E43 Unspecified severe protein-calorie malnutrition: Secondary | ICD-10-CM

## 2022-08-06 HISTORY — DX: Testicular hypofunction: E29.1

## 2022-08-06 MED ORDER — XYOSTED 75 MG/0.5ML ~~LOC~~ SOAJ: 75.0000 mg | SUBCUTANEOUS | 5 refills | Status: DC

## 2022-08-06 MED ORDER — XYOSTED 50 MG/0.5ML ~~LOC~~ SOAJ: 75.0000 mg | SUBCUTANEOUS | 5 refills | Status: DC

## 2022-08-06 NOTE — Assessment & Plan Note (Signed)
Continues to lose weight. Will benefit from PEG feeding tube.  GI referral placed today. Also referred to medical weight management clinic. May benefit from weekly testosterone supplementation Also presently taking Marinol 2.5 mg twice a day

## 2022-08-06 NOTE — Telephone Encounter (Signed)
Pharmacy called and asked if Dr. Mitchel Honour would like to change the dosage of Testosterone Enanthate (XYOSTED) 50 MG/0.5ML SOAJ  to the '75MG'$ /0.5ML to match the dosage and not waste any medication.  [facility] in [place]

## 2022-08-06 NOTE — Telephone Encounter (Signed)
New prescription sent to pharmacy requested.  Thanks

## 2022-08-06 NOTE — Assessment & Plan Note (Signed)
Will benefit from testosterone supplementation Recommend to start Xyosted 75 mg weekly.

## 2022-08-06 NOTE — Progress Notes (Signed)
Earl Thomas 72 y.o.   Chief Complaint  Patient presents with   Follow-up    F/u appt after seeing [facility] provider, per [facility] provider poss tube feeding due to malnutrition, testosterone replacement.     HISTORY OF PRESENT ILLNESS: This is a 72 y.o. male complaining of weight loss Was recently evaluated by [facility] providers for possible lung transplant. Recommendation is to improve overall nutritional status before proceeding. They recommend PEG feeding tube and also testosterone supplementation. Office notes reviewed. Wt Readings from Last 3 Encounters:  08/06/22 94 lb 6 oz (42.8 kg)  06/17/22 99 lb (44.9 kg)  06/07/22 99 lb 8 oz (45.1 kg)     HPI   Prior to Admission medications   Medication Sig Start Date End Date Taking? Authorizing Provider  acetaminophen (TYLENOL) 500 MG tablet Take 1,000 mg by mouth every 6 (six) hours as needed for headache (pain).   Yes [provider]  albuterol (VENTOLIN HFA) 108 (90 Base) MCG/ACT inhaler Inhale 1-2 puffs into the lungs every 6 (six) hours as needed for wheezing or shortness of breath. 06/17/22  Yes Parrett, Tammy S, NP  ascorbic acid (VITAMIN C) 500 MG tablet Take 500 mg by mouth daily.   Yes [provider]  aspirin EC 81 MG tablet Take 81 mg by mouth every morning.   Yes [provider]  atorvastatin (LIPITOR) 10 MG tablet Take 1 tablet (10 mg total) by mouth daily. 04/30/22  Yes Evolet Salminen, Ines Bloomer, MD  atorvastatin (LIPITOR) 20 MG tablet Take 20 mg by mouth daily.   Yes [provider]  azelastine (ASTELIN) 0.1 % nasal spray Place 2 sprays into both nostrils 2 (two) times daily. Use in each nostril as directed Patient taking differently: Place 2 sprays into both nostrils 2 (two) times daily as needed for rhinitis or allergies (congestion). Use in each nostril as directed 12/25/21  Yes Roney [place], MD  azelastine (ASTELIN) 0.1 % nasal spray Place 2 sprays into both nostrils 2 (two) times  daily. 12/25/21  Yes [provider]  Brimonidine Tartrate (LUMIFY) 0.025 % SOLN Place 1 drop into both eyes every Sunday.   Yes [provider]  cetirizine (ZYRTEC) 10 MG tablet Take 10 mg by mouth daily.   Yes [provider]  diclofenac sodium (VOLTAREN) 1 % GEL Apply 2 g topically 4 (four) times daily. Patient taking differently: Apply 2 g topically 4 (four) times daily as needed (pain). 09/18/18  Yes Forrest Moron, MD  dicyclomine (BENTYL) 20 MG tablet Take 1 tablet (20 mg total) by mouth 3 (three) times daily as needed. 06/07/22  Yes Long, Wonda Olds, MD  dronabinol (MARINOL) 2.5 MG capsule Take 1 capsule (2.5 mg total) by mouth 2 (two) times daily before a meal. 04/18/22  Yes Spero Geralds, MD  famotidine (PEPCID) 20 MG tablet Take 1 tablet (20 mg total) by mouth 2 (two) times daily. 04/30/22  Yes Tristen Luce, Ines Bloomer, MD  Fluticasone Furoate-Vilanterol (BREO ELLIPTA) 50-25 MCG/ACT AEPB Inhale 2 puffs into the lungs 2 (two) times daily. 08/05/22  Yes Annette Liotta, Ines Bloomer, MD  folic acid ([name]) 1 MG tablet Take 1 mg by mouth every morning. 08/27/19  Yes [provider]  guaifenesin (ROBITUSSIN) 100 MG/5ML syrup Take 200 mg by mouth 3 (three) times daily as needed for cough or congestion.   Yes [provider]  hydrocortisone (PROCTOZONE-HC) 2.5 % rectal cream Place 1 Application rectally 2 (two) times daily. 05/08/22  Yes Ravneet Spilker, Kittie Plater  Jose, MD  levocetirizine (XYZAL) 5 MG tablet TAKE 1 TABLET BY MOUTH ONCE DAILY IN THE EVENING 07/09/22  Yes Roney Marion, MD  loperamide (IMODIUM) 2 MG capsule Take 1 capsule (2 mg total) by mouth 4 (four) times daily as needed for diarrhea or loose stools. 06/07/22  Yes Long, Wonda Olds, MD  meloxicam (MOBIC) 7.5 MG tablet Take 1 tablet (7.5 mg total) by mouth daily as needed for pain. 06/04/22  Yes Parrett, Tammy S, NP  methotrexate (RHEUMATREX) 10 MG tablet Take 1 tablet (10 mg total) by mouth once a week.  Caution: Chemotherapy. Protect from light. 02/11/22  Yes Cobb, [name], NP  mirtazapine (REMERON) 15 MG tablet Take 0.5 tablets (7.5 mg total) by mouth at bedtime. 02/07/22  Yes Spero Geralds, MD  Naphazoline HCl (CLEAR EYES OP) Place 1 drop into both eyes daily.   Yes [provider]  ondansetron (ZOFRAN-ODT) 4 MG disintegrating tablet Take 1 tablet (4 mg total) by mouth every 8 (eight) hours as needed. 06/07/22  Yes Long, Wonda Olds, MD  predniSONE (DELTASONE) 10 MG tablet Take 6 tabs ('60mg'$ ) by mouth daily for 1 week, followed by 5 tabs daily for 1 week, then 4 tabs daily for 1 week, then 3 tabs daily, Pulmonary physician to titrate. 01/21/22  Yes Thurnell Lose, MD  riTUXimab (RITUXAN IV) Inject into the vein.   Yes [provider]  sulfamethoxazole-trimethoprim (BACTRIM DS) 800-160 MG tablet Take 1 pill on Monday, Wednesday, and Friday of each week 01/21/22  Yes Thurnell Lose, MD  vitamin C (ASCORBIC ACID) 500 MG tablet Take 500 mg by mouth daily after supper.   Yes [provider]  nitroGLYCERIN (NITROSTAT) 0.4 MG SL tablet Place 1 tablet (0.4 mg total) under the tongue every 5 (five) minutes as needed. Patient taking differently: Place 0.4 mg under the tongue every 5 (five) minutes as needed for chest pain. 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    Past Medical History:  Diagnosis Date   Abnormal weight loss 12/04/2021   Acute interstitial pneumonitis ([place]) 12/04/2021   Aortic atherosclerosis ([place]) 12/04/2021   Arthralgia of temporomandibular joint 12/04/2021   Arthritis    BMI 26.0-26.9,adult 10/22/2016   Chronic fatigue syndrome 12/04/2021   Dyslipidemia 03/10/2012   Family history of prostate cancer 12/04/2021   Glucose intolerance (impaired glucose tolerance)    Hyperlipidemia    Interstitial lung disease ([place]) 07/16/2019   Long-term current use of high risk medication other than anticoagulant 07/16/2019  Other long term (current) drug therapy 12/04/2021   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  Smoked a pip  in college, not daily.  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      Tobacco: pipe in 1980s      Alcohol: none      Exercise: joined [employer]; going 2-3 times per week.        Seatbelt:  100%   Social Determinants of Health   Financial Resource Strain: Low Risk  (11/19/2021)   Overall Financial Resource Strain (CARDIA)    Difficulty of Paying Living Expenses: Not hard at all  Food Insecurity: No Food Insecurity (11/19/2021)   Hunger Vital Sign    Worried About Running Out of Food in the Last Year: Never true    Ran Out of Food in the Last Year: Never true  Transportation Needs: No Transportation Needs (11/19/2021)   PRAPARE - Hydrologist (Medical): No    Lack of Transportation (Non-Medical): No  Physical Activity: Not on file  Stress: No Stress Concern Present (11/19/2021)   [place]    Feeling of Stress : Not at all  Social Connections: [place] (11/19/2021)   Social Connection and Isolation Panel [NHANES]    Frequency of Communication with Friends and Family: More than three times a week    Frequency of Social Gatherings with Friends and Family: More than three times a week    Attends Religious Services: More than 4 times per year    Active Member of [employer] or Organizations: Yes    Attends Music therapist: More than 4 times per year    Marital Status: Married  Human resources officer Violence: Not At Risk (11/19/2021)   Humiliation,  Afraid, Rape, and Kick questionnaire    Fear of Current or Ex-Partner: No    Emotionally Abused: No    Physically Abused: No    Sexually Abused: No    Family History  Problem Relation Age of Onset   Cancer Father 68  prostate cancer   Diabetes Father    Heart disease Father 89       AMI late 3s   Cancer Mother 32       Breast cancer   Heart disease Mother        CABG at age 77   Diabetes Brother    Heart disease Brother        AMI x 2; CABG   Hyperlipidemia Brother      Review of Systems  Constitutional:  Positive for weight loss. Negative for chills and fever.  HENT: Negative.  Negative for congestion and sore throat.   Respiratory:  Positive for shortness of breath.   Cardiovascular: Negative.  Negative for chest pain and palpitations.  Gastrointestinal:  Negative for abdominal pain, diarrhea, nausea and vomiting.  Genitourinary: Negative.  Negative for dysuria and hematuria.  Skin: Negative.  Negative for rash.  Neurological:  Positive for weakness.  All other systems reviewed and are negative.  Today's Vitals   08/06/22 1433  BP: 110/66  Pulse: (!) 106  Temp: 98.4 F (36.9 C)  TempSrc: Oral  SpO2: 98%  Weight: 94 lb 6 oz (42.8 kg)  Height: '5\' 5"'$  (1.651 m)   Body mass index is 15.7 kg/m.   Physical Exam Constitutional:      Appearance: Normal appearance.     Comments: Appears chronically ill.  Oxygen dependent  HENT:     Head: Normocephalic.     Mouth/Throat:     Mouth: Mucous membranes are moist.     Pharynx: Oropharynx is clear.  Eyes:     Extraocular Movements: Extraocular movements intact.     Pupils: Pupils are equal, round, and reactive to light.  Cardiovascular:     Rate and Rhythm: Normal rate and regular rhythm.     Pulses: Normal pulses.     Heart sounds: Normal heart sounds.  Pulmonary:     Effort: Pulmonary effort is normal.     Breath sounds: Normal breath sounds.  Musculoskeletal:     Cervical back: No tenderness.   Lymphadenopathy:     Cervical: No cervical adenopathy.  Skin:    General: Skin is warm and dry.     Capillary Refill: Capillary refill takes less than 2 seconds.  Neurological:     General: No focal deficit present.     Mental Status: He is alert and oriented to person, place, and time.  Psychiatric:        Mood and Affect: Mood normal.        Behavior: Behavior normal.      ASSESSMENT & PLAN: A total of 47 minutes was spent with the patient and counseling/coordination of care regarding preparing for this visit, review of most recent office visit notes, review of [facility] physician's office visit notes in the past couple months, review of multiple chronic medical conditions under management, review of all medications, need for PEG feeding tube, benefits of testosterone supplementation, prognosis, documentation and need for follow-up.  Problem List Items Addressed This Visit       Cardiovascular and Mediastinum   Chronic diastolic CHF (congestive heart failure) (HCC)    No clinical signs of acute congestive heart failure.        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   Recently evaluated for lung transplantation.  Not considered a candidate due to poor nutritional status. Condition continues to progress.  Oxygen dependent.  Endocrine   Hypogonadism in male    Will benefit from testosterone supplementation Recommend to start Xyosted 75 mg weekly.        Other   Protein-calorie malnutrition, severe ([place]) - Primary    Continues to lose weight. Will benefit from PEG feeding tube.  GI referral placed today. Also referred to medical weight management clinic. May benefit from weekly testosterone supplementation Also presently taking Marinol 2.5 mg twice a day      Relevant Orders   Amb Ref to Medical Weight Management   Ambulatory referral to Gastroenterology   Patient Instructions  Malnutrition,  Adult Malnutrition is a group of symptoms that affects adults, including the elderly. These symptoms include eating too little and losing weight. People who have this may not want to be with friends, or they may not want to eat or drink. This condition is not a normal part of getting older. What are the causes? A disease, such as dementia, diabetes, cancer, or lung disease. A health problem, such as a vitamin deficiency or a heart problem. A disorder, such as sadness (depression). A disability. Medicines. Having tooth or mouth problems. Neglect or being treated badly. In some cases, the cause may not be known. What are the signs or symptoms? Loss of more than 5% of your body weight. Being more tired than normal after an activity. Having trouble getting up after sitting. Not feeling hungry. Not getting out of bed. Not wanting to do your normal activities. Sadness. Getting infections often. Bedsores. Taking a long time to get better after an infection, injury, or a surgery. Weakness. How is this treated? Treatment for this condition depends on the cause. It may be treated by: Treating a disease or disorder that is causing symptoms. Having talk therapy or taking medicine to treat sadness. Eating better, such as eating more often or taking nutritional supplements. Changing or stopping a medicine. Having physical or occupational therapy. It often takes a team of doctors to find the right treatment. Follow these instructions at home:  Take over-the-counter and prescription medicines only as told by your doctor. Eat a healthy diet. Make sure that you eat enough. Ask your doctor how much you should eat. Be active. Do exercises that make you stronger (strength training). A physical therapist can help to set up a program that fits you. Make sure that you are safe at home. Have a plan for what to do if you cannot make decisions for yourself. Contact a doctor if: You are not able to eat  well. You are not able to move around. You feel very sad. You feel very hopeless. Get help right away if: You think about ending your life. You cannot eat or drink. You do not get out of bed. Staying at home is not safe. You have a fever. Get help right away if you feel like you may hurt yourself or others, or have thoughts about taking your own life. Go to your nearest emergency room or: Call 911. Call the [place] at [phone] or 988. This is open 24 hours a day. Text the Crisis Text Line at 573-329-9047. Summary Malnutrition is a group of symptoms that affect adults, including the elderly. Symptoms include eating too little and losing weight. Take all medicines only as told by your doctor. Eat a healthy diet. Make sure that you eat enough. Ask your doctor how much you should eat. Be active. Do exercises that make you stronger. A physical therapist can help to  set up a program that is right for you. This information is not intended to replace advice given to you by your health care provider. Make sure you discuss any questions you have with your health care provider. Document Revised: 03/09/2021 Document Reviewed: 03/09/2021 Elsevier Patient Education  [place], MD [facility]

## 2022-08-06 NOTE — Assessment & Plan Note (Signed)
No clinical signs of acute congestive heart failure.

## 2022-08-06 NOTE — Assessment & Plan Note (Addendum)
With chronic respiratory failure.   Recently evaluated for lung transplantation.  Not considered a candidate due to poor nutritional status. Condition continues to progress.  Oxygen dependent.

## 2022-08-06 NOTE — Patient Instructions (Signed)
Malnutrition, Adult Malnutrition is a group of symptoms that affects adults, including the elderly. These symptoms include eating too little and losing weight. People who have this may not want to be with friends, or they may not want to eat or drink. This condition is not a normal part of getting older. What are the causes? A disease, such as dementia, diabetes, cancer, or lung disease. A health problem, such as a vitamin deficiency or a heart problem. A disorder, such as sadness (depression). A disability. Medicines. Having tooth or mouth problems. Neglect or being treated badly. In some cases, the cause may not be known. What are the signs or symptoms? Loss of more than 5% of your body weight. Being more tired than normal after an activity. Having trouble getting up after sitting. Not feeling hungry. Not getting out of bed. Not wanting to do your normal activities. Sadness. Getting infections often. Bedsores. Taking a long time to get better after an infection, injury, or a surgery. Weakness. How is this treated? Treatment for this condition depends on the cause. It may be treated by: Treating a disease or disorder that is causing symptoms. Having talk therapy or taking medicine to treat sadness. Eating better, such as eating more often or taking nutritional supplements. Changing or stopping a medicine. Having physical or occupational therapy. It often takes a team of doctors to find the right treatment. Follow these instructions at home:  Take over-the-counter and prescription medicines only as told by your doctor. Eat a healthy diet. Make sure that you eat enough. Ask your doctor how much you should eat. Be active. Do exercises that make you stronger (strength training). A physical therapist can help to set up a program that fits you. Make sure that you are safe at home. Have a plan for what to do if you cannot make decisions for yourself. Contact a doctor if: You are not  able to eat well. You are not able to move around. You feel very sad. You feel very hopeless. Get help right away if: You think about ending your life. You cannot eat or drink. You do not get out of bed. Staying at home is not safe. You have a fever. Get help right away if you feel like you may hurt yourself or others, or have thoughts about taking your own life. Go to your nearest emergency room or: Call 911. Call the National Suicide Prevention Lifeline at 1-800-273-8255 or 988. This is open 24 hours a day. Text the Crisis Text Line at 741741. Summary Malnutrition is a group of symptoms that affect adults, including the elderly. Symptoms include eating too little and losing weight. Take all medicines only as told by your doctor. Eat a healthy diet. Make sure that you eat enough. Ask your doctor how much you should eat. Be active. Do exercises that make you stronger. A physical therapist can help to set up a program that is right for you. This information is not intended to replace advice given to you by your health care provider. Make sure you discuss any questions you have with your health care provider. Document Revised: 03/09/2021 Document Reviewed: 03/09/2021 Elsevier Patient Education  2023 Elsevier Inc.  

## 2022-08-08 ENCOUNTER — Encounter: Payer: Self-pay | Admitting: Emergency Medicine

## 2022-08-09 ENCOUNTER — Telehealth: Payer: Self-pay

## 2022-08-09 NOTE — Telephone Encounter (Signed)
Prebiotic/probiotic formulation most likely causing diarrhea.  Recommend to decrease amount or stop it altogether.  Thanks.

## 2022-08-09 NOTE — Telephone Encounter (Signed)
Pt called and stated that a PA is needed for his   Testosterone Enanthate (XYOSTED) 75 MG/0.5ML SOAJ

## 2022-08-09 NOTE — Telephone Encounter (Signed)
PA for Testosterone Enanthate Berdine Addison) submitted, awaiting response Key: [id number]

## 2022-08-14 ENCOUNTER — Telehealth: Payer: Self-pay | Admitting: Emergency Medicine

## 2022-08-14 DIAGNOSIS — E291 Testicular hypofunction: Secondary | ICD-10-CM

## 2022-08-14 NOTE — Telephone Encounter (Signed)
Pt states he called the [facility] office that we referred him to for his feeding tube but that location no longer puts in feeding tubes. Looks like the office closed the referral.  Please start new referral for pt's feeding tube and send it to a different Gastro office please.

## 2022-08-14 NOTE — Telephone Encounter (Signed)
We need to place a new referral but I do not know who handles feeding tubes and does not.  We need to find out.  Thanks.

## 2022-08-15 ENCOUNTER — Other Ambulatory Visit: Payer: Self-pay | Admitting: *Deleted

## 2022-08-15 DIAGNOSIS — E43 Unspecified severe protein-calorie malnutrition: Secondary | ICD-10-CM

## 2022-08-15 NOTE — Telephone Encounter (Signed)
Thank you.  Good job.

## 2022-08-15 NOTE — Telephone Encounter (Signed)
Called Interventional radiology in reference to patient tube feeding placement. Ordered has been place. Once the order is receive the patient chart will be reviewed to see if he is a good candidate.  Interventional radiology  will reach out to patient

## 2022-08-16 NOTE — Telephone Encounter (Signed)
Pharmacy Patient Advocate Encounter  Received notification from [place] that the request for prior authorization for Earl Thomas has been denied due to .    Please be advised we currently do not have a Pharmacist to review denials, therefore you will need to process appeals accordingly as needed. Thanks for your support at this time.

## 2022-08-19 ENCOUNTER — Telehealth: Payer: Self-pay | Admitting: Adult Health

## 2022-08-19 NOTE — Telephone Encounter (Signed)
Duplicate encounter, closing.

## 2022-08-19 NOTE — Telephone Encounter (Signed)
FMLA paperwork for patient's daughter, Aylan Partington, was completed and signed by Rexene Edison, NP.  I emailed a hard copy to the daughter and she will take care of getting it to her employer.

## 2022-08-22 NOTE — Telephone Encounter (Signed)
Pt calls inquiring about an update on feeding tube.  ** Pt has also asked about the testosterone and if the changes was made on the rx for insurance to cover medication. Pt states he does not know what it was that needed to be add to the rx for it to be approved.  Please call pt at [phone] with an update.

## 2022-08-22 NOTE — Telephone Encounter (Signed)
Morning testosterone level.  Thanks.

## 2022-08-22 NOTE — Telephone Encounter (Signed)
Called patient and informed her of an update on the feeding tube. Waiting for IR to contact me back with an update. I also updated patient on the testosterone levels. Patient will have to come in to get his labs drawn in the morning. Once the labs is drawn will send lab results to insurance company for the appeal

## 2022-08-23 ENCOUNTER — Ambulatory Visit: Payer: Medicare Other | Admitting: Adult Health

## 2022-08-26 ENCOUNTER — Other Ambulatory Visit: Payer: Medicare Other

## 2022-08-26 DIAGNOSIS — E291 Testicular hypofunction: Secondary | ICD-10-CM | POA: Diagnosis not present

## 2022-08-27 ENCOUNTER — Other Ambulatory Visit: Payer: Self-pay | Admitting: Emergency Medicine

## 2022-08-27 ENCOUNTER — Telehealth: Payer: Self-pay | Admitting: *Deleted

## 2022-08-27 ENCOUNTER — Ambulatory Visit (INDEPENDENT_AMBULATORY_CARE_PROVIDER_SITE_OTHER): Payer: Medicare Other | Admitting: Adult Health

## 2022-08-27 ENCOUNTER — Telehealth (HOSPITAL_COMMUNITY): Payer: Self-pay

## 2022-08-27 ENCOUNTER — Other Ambulatory Visit: Payer: Self-pay | Admitting: *Deleted

## 2022-08-27 ENCOUNTER — Other Ambulatory Visit: Payer: Medicare Other

## 2022-08-27 ENCOUNTER — Encounter: Payer: Self-pay | Admitting: Adult Health

## 2022-08-27 VITALS — BP 90/60 | HR 105 | Temp 97.7°F | Ht 65.0 in | Wt 93.0 lb

## 2022-08-27 DIAGNOSIS — E291 Testicular hypofunction: Secondary | ICD-10-CM | POA: Diagnosis not present

## 2022-08-27 DIAGNOSIS — J9611 Chronic respiratory failure with hypoxia: Secondary | ICD-10-CM

## 2022-08-27 DIAGNOSIS — J849 Interstitial pulmonary disease, unspecified: Secondary | ICD-10-CM | POA: Diagnosis not present

## 2022-08-27 DIAGNOSIS — M0510 Rheumatoid lung disease with rheumatoid arthritis of unspecified site: Secondary | ICD-10-CM | POA: Diagnosis not present

## 2022-08-27 DIAGNOSIS — E43 Unspecified severe protein-calorie malnutrition: Secondary | ICD-10-CM

## 2022-08-27 DIAGNOSIS — J42 Unspecified chronic bronchitis: Secondary | ICD-10-CM | POA: Diagnosis not present

## 2022-08-27 NOTE — Telephone Encounter (Signed)
Per IR, the feeding tube placement will take place at [facility].  I had to leave a message for [place] regarding the status of the procedure.  I requested that they call the patient to update on when the procedure will be scheduled.  Updated the patient/wife prior to leaving the office.

## 2022-08-27 NOTE — Telephone Encounter (Signed)
-----   Message from Suzette Battiest, MD sent at 08/27/2022 10:54 AM EST ----- Regarding: RE: Gastrostomy Tube placement Anatomy marginal for placement, but likely amenable after gastric insufflation.  Approved.  Dylan ----- Message ----- From: Chad Cordial Sent: 08/27/2022  10:42 AM EST To: Joanell Rising; Ir Procedure Requests Subject: Gastrostomy Tube placement                     Procedure: peg placement  Dx: protein calorie malnutrition  Ordering: Dr. Ines Bloomer Sagardia [phone]  Imagine: CT abd in epic 06/2022  Please review.   Thanks,  Lia Foyer

## 2022-08-27 NOTE — Progress Notes (Signed)
$'@Patient'q$  ID: Earl Thomas, male    DOB: 27-Mar-1951, 72 y.o.   MRN: [id number]  Chief Complaint  Patient presents with   Follow-up    Referring provider: Horald Pollen, *  HPI: 72 year old male followed for rheumatoid arthritis related interstitial lung disease and chronic respiratory failure on O2., chronic bronchitis  Unable to tolerate Esbriet or Ofev due to progressive weight loss and GI issues Has rheumatoid arthritis followed by rheumatology on Rituxan and methotrexate  TEST/EVENTS :  ILD :  Intolerant to Esbriet and Ofev- GI issues , severe weight loss.   PFTs October 29, 2021 FEV1 50%, ratio 99, FVC 38% diffusing capacity 39%  Care everywhere [facility] transplant workup:  Right and left heart cath-Low filling pressures with RA of 2, PA 38/17 mean 24 and wedge 4  cardiac output is 4.4 L/min with index of 3.2 and PVR of 4.5.  SVR is 18.5  LHC-Non obstructive dz.  Barium swallow no aspiration, mild esophageal dysmotility, no reflux 6-minute walk test required 4 L of oxygen to keep O2 saturation greater than 88%. CT chest July 16, 2022 progressive severe fibrotic interstitial lung disease no worrisome pulmonary nodules PET scan July 16, 2022 no evidence of metabolically active malignancy. Echo with bubble study normal left ventricular systolic function, mild MR, moderate TR.  Positive saline microcavitation study.    08/27/2022 Follow up: RA r/t ILD, O2 RF  Patient returns for a 62-monthfollow-up.  Patient has underlying progressive fibrotic interstitial lung disease related to rheumatoid arthritis and chronic respiratory failure.  Patient says he continues to get more more short of breath with minimum activities.  Has associated low activity tolerance.  He is just finished up a evaluation with the [facility] transplant team.  Unfortunately he continues to have ongoing weight loss and BMI is at 15.  Currently he has been taken off the evaluation process for [facility]  transplant team until he can get his weight under control.  He has been recommended for a feeding tube.  He is currently waiting on interventional radiology to schedule feeding tube placement.  Patient says he is trying to eat a high-protein diet and has protein supplements such as boost and Ensure.  He also has been recommended to stop methotrexate as it may be causing GI issues and ongoing weight loss.  He does have a Rituxan infusion due in March.  He remains on oxygen 3 L without increased oxygen demands.  Patient denies any discolored mucus fever.  Remains on Symbicort twice daily.  Has a daily cough.  No Known Allergies  Immunization History  Administered Date(s) Administered   Fluad Quad(high Dose 65+) 04/13/2020, 04/18/2021, 03/11/2022   Hep A / Hep B 02/12/2022   Influenza, High Dose Seasonal PF 04/24/2019   Influenza,inj,Quad PF,6+ Mos 05/30/2015, 04/23/2016, 05/14/2017, 03/25/2018   Influenza-Unspecified 04/07/2014   PFIZER(Purple Top)SARS-COV-2 Vaccination 07/30/2019, 08/20/2019, 04/27/2020   PNEUMOCOCCAL CONJUGATE-20 12/25/2020   Pfizer Covid-19 Vaccine Bivalent Booster 19yr& up 04/24/2021   Pneumococcal Conjugate-13 05/18/2018   Pneumococcal Polysaccharide-23 06/09/2019   Td 03/10/2021   Tdap 02/13/2009, 05/14/2017   Zoster Recombinat (Shingrix) 05/27/2018, 01/19/2019   Zoster, Live 04/12/2014    Past Medical History:  Diagnosis Date   Abnormal weight loss 12/04/2021   Acute interstitial pneumonitis ([facility]/30/2023   Aortic atherosclerosis ([facility]5/30/2023   Arthralgia of temporomandibular joint 12/04/2021   Arthritis    BMI 26.0-26.9,adult 10/22/2016   Chronic fatigue syndrome 12/04/2021   Dyslipidemia 03/10/2012   Family history  of prostate cancer 12/04/2021   Glucose intolerance (impaired glucose tolerance)    Hyperlipidemia    Interstitial lung disease ([place]) 07/16/2019   Long-term current use of high risk medication other than anticoagulant 07/16/2019   Other long term  (current) drug therapy 12/04/2021   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   Renal insufficiency 04/29/2016   Rheumatoid lung disease with rheumatoid arthritis ([place]) 07/16/2019   Skin lesion of chest wall 12/04/2021    Tobacco History: Social History   Tobacco Use  Smoking Status Former   Types: Pipe   Quit date: 10/06/1988   Years since quitting: 33.9   Passive exposure: Never  Smokeless Tobacco Never  Tobacco Comments   parent smoked in home as a child.  Smoked a pip in college, not daily.   Counseling given: Not Answered Tobacco comments: parent smoked in home as a child.  Smoked a pip in college, not daily.   Outpatient Medications Prior to Visit  Medication Sig Dispense Refill   acetaminophen (TYLENOL) 500 MG tablet Take 1,000 mg by mouth every 6 (six) hours as needed for headache (pain).     albuterol (VENTOLIN HFA) 108 (90 Base) MCG/ACT inhaler Inhale 1-2 puffs into the lungs every 6 (six) hours as needed for wheezing or shortness of breath. 18 g 5   ascorbic acid (VITAMIN C) 500 MG tablet Take 500 mg by mouth daily.     aspirin EC 81 MG tablet Take 81 mg by mouth every morning.     atorvastatin (LIPITOR) 10 MG tablet Take 1 tablet (10 mg total) by mouth daily. 90 tablet 3   atorvastatin (LIPITOR) 20 MG tablet Take 20 mg by mouth daily.     azelastine (ASTELIN) 0.1 % nasal spray Place 2 sprays into both nostrils 2 (two) times daily. Use in each nostril as directed (Patient taking differently: Place 2 sprays into both nostrils 2 (two) times daily as needed for rhinitis or allergies (congestion). Use in each nostril as directed) 30 mL 12   azelastine (ASTELIN) 0.1 % nasal spray Place 2 sprays into both nostrils 2 (two) times daily.     Brimonidine Tartrate (LUMIFY) 0.025 % SOLN Place 1 drop into both eyes every Sunday.     cetirizine (ZYRTEC) 10 MG tablet Take 10 mg by mouth daily.     diclofenac sodium  (VOLTAREN) 1 % GEL Apply 2 g topically 4 (four) times daily. (Patient taking differently: Apply 2 g topically 4 (four) times daily as needed (pain).) 100 g 1   dicyclomine (BENTYL) 20 MG tablet Take 1 tablet (20 mg total) by mouth 3 (three) times daily as needed. 20 tablet 0   dronabinol (MARINOL) 2.5 MG capsule Take 1 capsule (2.5 mg total) by mouth 2 (two) times daily before a meal. 60 capsule 3   famotidine (PEPCID) 20 MG tablet Take 1 tablet (20 mg total) by mouth 2 (two) times daily. 90 tablet 3   Fluticasone Furoate-Vilanterol (BREO ELLIPTA) 50-25 MCG/ACT AEPB Inhale 2 puffs into the lungs 2 (two) times daily. 60 each 2   folic acid ([name]) 1 MG tablet Take 1 mg by mouth every morning.     guaifenesin (ROBITUSSIN) 100 MG/5ML syrup Take 200 mg by mouth 3 (three) times daily as needed for cough or congestion.     hydrocortisone (PROCTOZONE-HC) 2.5 % rectal cream Place 1 Application rectally 2 (two) times daily. 30 g 0   levocetirizine (XYZAL) 5 MG  tablet TAKE 1 TABLET BY MOUTH ONCE DAILY IN THE EVENING 90 tablet 0   loperamide (IMODIUM) 2 MG capsule Take 1 capsule (2 mg total) by mouth 4 (four) times daily as needed for diarrhea or loose stools. 12 capsule 0   meloxicam (MOBIC) 7.5 MG tablet Take 1 tablet (7.5 mg total) by mouth daily as needed for pain. 30 tablet 0   mirtazapine (REMERON) 15 MG tablet Take 0.5 tablets (7.5 mg total) by mouth at bedtime. 30 tablet 5   Naphazoline HCl (CLEAR EYES OP) Place 1 drop into both eyes daily.     ondansetron (ZOFRAN-ODT) 4 MG disintegrating tablet Take 1 tablet (4 mg total) by mouth every 8 (eight) hours as needed. 20 tablet 0   predniSONE (DELTASONE) 10 MG tablet Take 6 tabs ('60mg'$ ) by mouth daily for 1 week, followed by 5 tabs daily for 1 week, then 4 tabs daily for 1 week, then 3 tabs daily, Pulmonary physician to titrate. 132 tablet 0   riTUXimab (RITUXAN IV) Inject into the vein.     sulfamethoxazole-trimethoprim (BACTRIM DS) 800-160 MG tablet Take  1 pill on Monday, Wednesday, and Friday of each week 15 tablet 0   Testosterone Enanthate (XYOSTED) 75 MG/0.5ML SOAJ Inject 75 mg into the skin once a week. 1.96 mL 5   vitamin C (ASCORBIC ACID) 500 MG tablet Take 500 mg by mouth daily after supper.     methotrexate (RHEUMATREX) 10 MG tablet Take 1 tablet (10 mg total) by mouth once a week. Caution: Chemotherapy. Protect from light. (Patient not taking: Reported on 08/27/2022) 4 tablet 2   nitroGLYCERIN (NITROSTAT) 0.4 MG SL tablet Place 1 tablet (0.4 mg total) under the tongue every 5 (five) minutes as needed. (Patient taking differently: Place 0.4 mg under the tongue every 5 (five) minutes as needed for chest pain.) 25 tablet 6   No facility-administered medications prior to visit.     Review of Systems:   Constitutional:   No  weight loss, night sweats,  Fevers, chills,  +fatigue, or  lassitude.  HEENT:   No headaches,  Difficulty swallowing,  Tooth/dental problems, or  Sore throat,                No sneezing, itching, ear ache, nasal congestion, post nasal drip,   CV:  No chest pain,  Orthopnea, PND, swelling in lower extremities, anasarca, dizziness, palpitations, syncope.   GI  No heartburn, indigestion, abdominal pain, nausea, vomiting, diarrhea, change in bowel habits, loss of appetite, bloody stools.   Resp:  No chest wall deformity  Skin: no rash or lesions.  GU: no dysuria, change in color of urine, no urgency or frequency.  No flank pain, no hematuria   MS:  No joint pain or swelling.  No decreased range of motion.  No back pain.    Physical Exam  BP 90/60 (BP Location: Left Arm, Patient Position: Sitting, Cuff Size: Normal)   Pulse (!) 105   Temp 97.7 F (36.5 C) (Oral)   Ht '5\' 5"'$  (1.651 m)   Wt 93 lb (42.2 kg)   SpO2 96%   BMI 15.48 kg/m   GEN: A/Ox3; pleasant , NAD, thin and frail    HEENT:  [place]/AT,  EACs-clear, TMs-wnl, NOSE-clear, THROAT-clear, no lesions, no postnasal drip or exudate noted.   NECK:   Supple w/ fair ROM; no JVD; normal carotid impulses w/o bruits; no thyromegaly or nodules palpated; no lymphadenopathy.    RESP bibasilar crackles  no accessory  muscle use, no dullness to percussion  CARD:  RRR, no m/r/g, no peripheral edema, pulses intact, no cyanosis or clubbing.  GI:   Soft & nt; nml bowel sounds; no organomegaly or masses detected.   Musco: Warm bil, no deformities or joint swelling noted.   Neuro: alert, no focal deficits noted.    Skin: Warm, no lesions or rashes    Lab Results:    BMET   ProBNP No results found for: "PROBNP"  Imaging: No results found.       Latest Ref Rng & Units 10/29/2021    3:00 PM 04/18/2021    3:05 PM 11/13/2020    3:07 PM 08/07/2020   10:04 AM 07/26/2019   10:56 AM  PFT Results  FVC-Pre L 1.22  1.71  1.71  1.37  1.52   FVC-Predicted Pre % 38  54  54  43  47   FVC-Post L     1.82   FVC-Predicted Post %     57   Pre FEV1/FVC % % 99  94  92  100  100   Post FEV1/FCV % %     88   FEV1-Pre L 1.21  1.61  1.57  1.37  1.52   FEV1-Predicted Pre % 50  67  65  57  63   FEV1-Post L     1.61   DLCO uncorrected ml/min/mmHg 8.80  10.08  11.16  9.68  11.79   DLCO UNC% % 39  45  49  42  52   DLCO corrected ml/min/mmHg 8.80   11.16  9.68    DLCO COR %Predicted % 39   49  42    DLVA Predicted % 55  71  104  73  82   TLC L     3.42   TLC % Predicted %     57   RV % Predicted %     84     No results found for: "NITRICOXIDE"      Assessment & Plan:   No problem-specific Assessment & Plan notes found for this encounter.     Rexene Edison, NP 08/27/2022

## 2022-08-27 NOTE — Patient Instructions (Addendum)
Continue Symbicort 2 puffs Twice daily. Brush tongue and rinse mouth afterwards Continue Albuterol inhaler 2 puffs or 3 mL neb every 6 hours as needed for shortness of breath or wheezing.  Continue on Oxygen 3l/m .  Flutter valve 2-3 times a day. Do this after your inhaler  Small, frequent high protein meals. Continue Ensure/Boost /smoothie supplements Keep follow up with Rheumatology as planned.  Follow up in 2 months with Dr. Shearon Stalls or Leilanni Halvorson NP and As needed   Please contact office for sooner follow up if symptoms do not improve or worsen or seek emergency care

## 2022-08-28 ENCOUNTER — Telehealth (HOSPITAL_COMMUNITY): Payer: Self-pay

## 2022-08-28 ENCOUNTER — Other Ambulatory Visit: Payer: Self-pay | Admitting: *Deleted

## 2022-08-28 DIAGNOSIS — E43 Unspecified severe protein-calorie malnutrition: Secondary | ICD-10-CM

## 2022-08-28 NOTE — Telephone Encounter (Signed)
Spoke to Dr. Barry Brunner nurse this morning. Explained to her that we can gladly place the pt's peg tube but home health and nutrition will need to be set up so that the pt has someone to manage after placement. She is going to work on this and give me a call back. AB

## 2022-08-30 DIAGNOSIS — Z7951 Long term (current) use of inhaled steroids: Secondary | ICD-10-CM | POA: Diagnosis not present

## 2022-08-30 DIAGNOSIS — Z431 Encounter for attention to gastrostomy: Secondary | ICD-10-CM | POA: Diagnosis not present

## 2022-08-30 DIAGNOSIS — Z7952 Long term (current) use of systemic steroids: Secondary | ICD-10-CM | POA: Diagnosis not present

## 2022-08-30 DIAGNOSIS — E291 Testicular hypofunction: Secondary | ICD-10-CM | POA: Diagnosis not present

## 2022-08-30 DIAGNOSIS — G9332 Myalgic encephalomyelitis/chronic fatigue syndrome: Secondary | ICD-10-CM | POA: Diagnosis not present

## 2022-08-30 DIAGNOSIS — J9610 Chronic respiratory failure, unspecified whether with hypoxia or hypercapnia: Secondary | ICD-10-CM | POA: Diagnosis not present

## 2022-08-30 DIAGNOSIS — Z87891 Personal history of nicotine dependence: Secondary | ICD-10-CM | POA: Diagnosis not present

## 2022-08-30 DIAGNOSIS — I700 Atherosclerosis of aorta: Secondary | ICD-10-CM | POA: Diagnosis not present

## 2022-08-30 DIAGNOSIS — Z556 Problems related to health literacy: Secondary | ICD-10-CM | POA: Diagnosis not present

## 2022-08-30 DIAGNOSIS — M1990 Unspecified osteoarthritis, unspecified site: Secondary | ICD-10-CM | POA: Diagnosis not present

## 2022-08-30 DIAGNOSIS — K219 Gastro-esophageal reflux disease without esophagitis: Secondary | ICD-10-CM | POA: Diagnosis not present

## 2022-08-30 DIAGNOSIS — J9611 Chronic respiratory failure with hypoxia: Secondary | ICD-10-CM | POA: Insufficient documentation

## 2022-08-30 DIAGNOSIS — J42 Unspecified chronic bronchitis: Secondary | ICD-10-CM | POA: Diagnosis not present

## 2022-08-30 DIAGNOSIS — D352 Benign neoplasm of pituitary gland: Secondary | ICD-10-CM | POA: Diagnosis not present

## 2022-08-30 DIAGNOSIS — Z9981 Dependence on supplemental oxygen: Secondary | ICD-10-CM | POA: Diagnosis not present

## 2022-08-30 DIAGNOSIS — M0510 Rheumatoid lung disease with rheumatoid arthritis of unspecified site: Secondary | ICD-10-CM | POA: Diagnosis not present

## 2022-08-30 DIAGNOSIS — E43 Unspecified severe protein-calorie malnutrition: Secondary | ICD-10-CM | POA: Diagnosis not present

## 2022-08-30 DIAGNOSIS — J849 Interstitial pulmonary disease, unspecified: Secondary | ICD-10-CM | POA: Diagnosis not present

## 2022-08-30 DIAGNOSIS — I5032 Chronic diastolic (congestive) heart failure: Secondary | ICD-10-CM | POA: Diagnosis not present

## 2022-08-30 DIAGNOSIS — E785 Hyperlipidemia, unspecified: Secondary | ICD-10-CM | POA: Diagnosis not present

## 2022-08-30 DIAGNOSIS — Z791 Long term (current) use of non-steroidal anti-inflammatories (NSAID): Secondary | ICD-10-CM | POA: Diagnosis not present

## 2022-08-30 HISTORY — DX: Chronic respiratory failure with hypoxia: J96.11

## 2022-08-30 NOTE — Assessment & Plan Note (Signed)
Continue follow-up with rheumatology 

## 2022-08-30 NOTE — Assessment & Plan Note (Signed)
Progressive fibrotic interstitial lung disease-RA related ILD.  Patient has significant symptom burden with progressive shortness of breath decreased activity tolerance.  Has underwent an extensive evaluation with the [facility] transplant team.  Unfortunately his current weight with a BMI of 15 is prohibitive to continue with the transplant team at this time.  They have recommended a feeding tube to help with weight gain.  Currently waiting on interventional radiology for scheduling. Also recommend to stop methotrexate which he is now off of.  Continue with rheumatology follow-up and Rituxan.  Unable to tolerate Esbriet or Ofev.  Plan  Patient Instructions  Continue Symbicort 2 puffs Twice daily. Brush tongue and rinse mouth afterwards Continue Albuterol inhaler 2 puffs or 3 mL neb every 6 hours as needed for shortness of breath or wheezing.  Continue on Oxygen 3l/m .  Flutter valve 2-3 times a day. Do this after your inhaler  Small, frequent high protein meals. Continue Ensure/Boost /smoothie supplements Keep follow up with Rheumatology as planned.  Follow up in 2 months with Dr. Shearon Stalls or Yochanan Eddleman NP and As needed   Please contact office for sooner follow up if symptoms do not improve or worsen or seek emergency care

## 2022-08-30 NOTE — Assessment & Plan Note (Signed)
Currently stable continue on Symbicort.  Albuterol as needed

## 2022-08-30 NOTE — Assessment & Plan Note (Addendum)
Continue on oxygen to maintain O2 saturation greater than 88 to 90%

## 2022-08-31 LAB — TESTT+TESTF+SHBG
Sex Hormone Binding: 165 nmol/L — ABNORMAL HIGH (ref 19.3–76.4)
Testosterone, Free: 3.7 pg/mL — ABNORMAL LOW (ref 6.6–18.1)
Testosterone, Total, LC/MS: 287.1 ng/dL (ref 264.0–916.0)

## 2022-09-01 LAB — TESTOSTERONE,FREE AND TOTAL
Testosterone, Free: 2.2 pg/mL — ABNORMAL LOW (ref 6.6–18.1)
Testosterone: 498 ng/dL (ref 264–916)

## 2022-09-06 ENCOUNTER — Telehealth: Payer: Self-pay | Admitting: Emergency Medicine

## 2022-09-06 NOTE — Telephone Encounter (Signed)
Pt called about Testosterone Enanthate (XYOSTED) 75 MG/0.5ML said his insurance denied his Rx. Pt wants to know what he need to do so can receive his medication.  Please call pt back with update Last Ov 08/06/22

## 2022-09-06 NOTE — Telephone Encounter (Signed)
Called patient and informed him that the labs required for the appeal has come back. I have completed his appeal and is waiting for provider signature, once signed will fax to insurance company

## 2022-09-07 DIAGNOSIS — J42 Unspecified chronic bronchitis: Secondary | ICD-10-CM | POA: Diagnosis not present

## 2022-09-07 DIAGNOSIS — M0510 Rheumatoid lung disease with rheumatoid arthritis of unspecified site: Secondary | ICD-10-CM | POA: Diagnosis not present

## 2022-09-07 DIAGNOSIS — J9610 Chronic respiratory failure, unspecified whether with hypoxia or hypercapnia: Secondary | ICD-10-CM | POA: Diagnosis not present

## 2022-09-07 DIAGNOSIS — E43 Unspecified severe protein-calorie malnutrition: Secondary | ICD-10-CM | POA: Diagnosis not present

## 2022-09-07 DIAGNOSIS — I700 Atherosclerosis of aorta: Secondary | ICD-10-CM | POA: Diagnosis not present

## 2022-09-07 DIAGNOSIS — I5032 Chronic diastolic (congestive) heart failure: Secondary | ICD-10-CM | POA: Diagnosis not present

## 2022-09-09 NOTE — Telephone Encounter (Signed)
Clinical information submitted to insurance company. Appeal process started

## 2022-09-13 NOTE — Telephone Encounter (Signed)
Pharmacy Patient Advocate Encounter  Prior Authorization for Earl Thomas Auto-inj has been approved by [employer]    Effective dates: 07/08/2022 through 09/09/2023

## 2022-09-16 NOTE — Telephone Encounter (Signed)
Patient is aware for medication approval

## 2022-09-20 DIAGNOSIS — I5032 Chronic diastolic (congestive) heart failure: Secondary | ICD-10-CM | POA: Diagnosis not present

## 2022-09-20 DIAGNOSIS — M0510 Rheumatoid lung disease with rheumatoid arthritis of unspecified site: Secondary | ICD-10-CM | POA: Diagnosis not present

## 2022-09-20 DIAGNOSIS — E43 Unspecified severe protein-calorie malnutrition: Secondary | ICD-10-CM | POA: Diagnosis not present

## 2022-09-20 DIAGNOSIS — J42 Unspecified chronic bronchitis: Secondary | ICD-10-CM | POA: Diagnosis not present

## 2022-09-20 DIAGNOSIS — J9610 Chronic respiratory failure, unspecified whether with hypoxia or hypercapnia: Secondary | ICD-10-CM | POA: Diagnosis not present

## 2022-09-20 DIAGNOSIS — I700 Atherosclerosis of aorta: Secondary | ICD-10-CM | POA: Diagnosis not present

## 2022-09-24 DIAGNOSIS — R5383 Other fatigue: Secondary | ICD-10-CM | POA: Diagnosis not present

## 2022-09-24 DIAGNOSIS — Z79899 Other long term (current) drug therapy: Secondary | ICD-10-CM | POA: Diagnosis not present

## 2022-09-24 DIAGNOSIS — M0579 Rheumatoid arthritis with rheumatoid factor of multiple sites without organ or systems involvement: Secondary | ICD-10-CM | POA: Diagnosis not present

## 2022-09-27 ENCOUNTER — Other Ambulatory Visit (HOSPITAL_COMMUNITY): Payer: Self-pay | Admitting: Student

## 2022-09-27 DIAGNOSIS — M0510 Rheumatoid lung disease with rheumatoid arthritis of unspecified site: Secondary | ICD-10-CM | POA: Diagnosis not present

## 2022-09-27 DIAGNOSIS — J42 Unspecified chronic bronchitis: Secondary | ICD-10-CM | POA: Diagnosis not present

## 2022-09-27 DIAGNOSIS — J9610 Chronic respiratory failure, unspecified whether with hypoxia or hypercapnia: Secondary | ICD-10-CM | POA: Diagnosis not present

## 2022-09-27 DIAGNOSIS — I5032 Chronic diastolic (congestive) heart failure: Secondary | ICD-10-CM | POA: Diagnosis not present

## 2022-09-27 DIAGNOSIS — I700 Atherosclerosis of aorta: Secondary | ICD-10-CM | POA: Diagnosis not present

## 2022-09-27 DIAGNOSIS — E43 Unspecified severe protein-calorie malnutrition: Secondary | ICD-10-CM | POA: Diagnosis not present

## 2022-09-29 DIAGNOSIS — D352 Benign neoplasm of pituitary gland: Secondary | ICD-10-CM | POA: Diagnosis not present

## 2022-09-29 DIAGNOSIS — E291 Testicular hypofunction: Secondary | ICD-10-CM | POA: Diagnosis not present

## 2022-09-29 DIAGNOSIS — G9332 Myalgic encephalomyelitis/chronic fatigue syndrome: Secondary | ICD-10-CM | POA: Diagnosis not present

## 2022-09-29 DIAGNOSIS — Z7952 Long term (current) use of systemic steroids: Secondary | ICD-10-CM | POA: Diagnosis not present

## 2022-09-29 DIAGNOSIS — Z87891 Personal history of nicotine dependence: Secondary | ICD-10-CM | POA: Diagnosis not present

## 2022-09-29 DIAGNOSIS — J9610 Chronic respiratory failure, unspecified whether with hypoxia or hypercapnia: Secondary | ICD-10-CM | POA: Diagnosis not present

## 2022-09-29 DIAGNOSIS — K219 Gastro-esophageal reflux disease without esophagitis: Secondary | ICD-10-CM | POA: Diagnosis not present

## 2022-09-29 DIAGNOSIS — Z431 Encounter for attention to gastrostomy: Secondary | ICD-10-CM | POA: Diagnosis not present

## 2022-09-29 DIAGNOSIS — J42 Unspecified chronic bronchitis: Secondary | ICD-10-CM | POA: Diagnosis not present

## 2022-09-29 DIAGNOSIS — I700 Atherosclerosis of aorta: Secondary | ICD-10-CM | POA: Diagnosis not present

## 2022-09-29 DIAGNOSIS — Z7951 Long term (current) use of inhaled steroids: Secondary | ICD-10-CM | POA: Diagnosis not present

## 2022-09-29 DIAGNOSIS — J849 Interstitial pulmonary disease, unspecified: Secondary | ICD-10-CM | POA: Diagnosis not present

## 2022-09-29 DIAGNOSIS — E43 Unspecified severe protein-calorie malnutrition: Secondary | ICD-10-CM | POA: Diagnosis not present

## 2022-09-29 DIAGNOSIS — I5032 Chronic diastolic (congestive) heart failure: Secondary | ICD-10-CM | POA: Diagnosis not present

## 2022-09-29 DIAGNOSIS — Z9981 Dependence on supplemental oxygen: Secondary | ICD-10-CM | POA: Diagnosis not present

## 2022-09-29 DIAGNOSIS — M1990 Unspecified osteoarthritis, unspecified site: Secondary | ICD-10-CM | POA: Diagnosis not present

## 2022-09-29 DIAGNOSIS — Z556 Problems related to health literacy: Secondary | ICD-10-CM | POA: Diagnosis not present

## 2022-09-29 DIAGNOSIS — Z791 Long term (current) use of non-steroidal anti-inflammatories (NSAID): Secondary | ICD-10-CM | POA: Diagnosis not present

## 2022-09-29 DIAGNOSIS — M0510 Rheumatoid lung disease with rheumatoid arthritis of unspecified site: Secondary | ICD-10-CM | POA: Diagnosis not present

## 2022-09-29 DIAGNOSIS — E785 Hyperlipidemia, unspecified: Secondary | ICD-10-CM | POA: Diagnosis not present

## 2022-09-30 ENCOUNTER — Ambulatory Visit (HOSPITAL_COMMUNITY)
Admission: RE | Admit: 2022-09-30 | Discharge: 2022-09-30 | Disposition: A | Discharge status: Home / Self Care | Payer: Medicare Other | Source: Ambulatory Visit | Attending: Emergency Medicine | Admitting: Emergency Medicine

## 2022-10-02 ENCOUNTER — Other Ambulatory Visit: Payer: Self-pay | Admitting: Radiology

## 2022-10-03 ENCOUNTER — Ambulatory Visit (HOSPITAL_COMMUNITY)
Admission: RE | Admit: 2022-10-03 | Discharge: 2022-10-03 | Disposition: A | Discharge status: Home / Self Care | Payer: Medicare Other | Source: Ambulatory Visit | Attending: Emergency Medicine | Admitting: Emergency Medicine

## 2022-10-03 ENCOUNTER — Other Ambulatory Visit: Payer: Self-pay | Admitting: Radiology

## 2022-10-03 ENCOUNTER — Other Ambulatory Visit: Payer: Self-pay

## 2022-10-03 DIAGNOSIS — Z431 Encounter for attention to gastrostomy: Secondary | ICD-10-CM | POA: Diagnosis not present

## 2022-10-03 DIAGNOSIS — J849 Interstitial pulmonary disease, unspecified: Secondary | ICD-10-CM | POA: Insufficient documentation

## 2022-10-03 DIAGNOSIS — Z87891 Personal history of nicotine dependence: Secondary | ICD-10-CM | POA: Insufficient documentation

## 2022-10-03 DIAGNOSIS — E43 Unspecified severe protein-calorie malnutrition: Secondary | ICD-10-CM | POA: Diagnosis not present

## 2022-10-03 HISTORY — PX: IR GASTROSTOMY TUBE MOD SED: IMG625

## 2022-10-03 LAB — CBC
HCT: 43 % (ref 39.0–52.0)
Hemoglobin: 13.1 g/dL (ref 13.0–17.0)
MCH: 27.6 pg (ref 26.0–34.0)
MCHC: 30.5 g/dL (ref 30.0–36.0)
MCV: 90.7 fL (ref 80.0–100.0)
Platelets: 159 10*3/uL (ref 150–400)
RBC: 4.74 MIL/uL (ref 4.22–5.81)
RDW: 13.2 % (ref 11.5–15.5)
WBC: 5.1 10*3/uL (ref 4.0–10.5)
nRBC: 0 % (ref 0.0–0.2)

## 2022-10-03 LAB — PROTIME-INR
INR: 1 (ref 0.8–1.2)
Prothrombin Time: 13.5 seconds (ref 11.4–15.2)

## 2022-10-03 MED ORDER — FENTANYL CITRATE (PF) 100 MCG/2ML IJ SOLN
INTRAMUSCULAR | Status: AC
  Filled 2022-10-03: qty 2

## 2022-10-03 MED ORDER — IOHEXOL 300 MG/ML  SOLN
50.0000 mL | Freq: Once | INTRAMUSCULAR | Status: AC | PRN
  Administered 2022-10-03: 35 mL

## 2022-10-03 MED ORDER — SODIUM CHLORIDE 0.9 % IV SOLN: INTRAVENOUS | Status: DC

## 2022-10-03 MED ORDER — OXYCODONE HCL 5 MG PO TABS
5.0000 mg | ORAL_TABLET | Freq: Once | ORAL | Status: AC
  Administered 2022-10-03: 5 mg via ORAL

## 2022-10-03 MED ORDER — LIDOCAINE HCL 1 % IJ SOLN
INTRAMUSCULAR | Status: AC
  Administered 2022-10-03: 10 mL
  Filled 2022-10-03: qty 20

## 2022-10-03 MED ORDER — GLUCAGON HCL RDNA (DIAGNOSTIC) 1 MG IJ SOLR
INTRAMUSCULAR | Status: AC | PRN
  Administered 2022-10-03: 1 mg via INTRAVENOUS

## 2022-10-03 MED ORDER — OXYCODONE HCL 5 MG PO TABS
ORAL_TABLET | ORAL | Status: AC
  Filled 2022-10-03: qty 1

## 2022-10-03 MED ORDER — OXYCODONE-ACETAMINOPHEN 5-325 MG PO TABS: 1.0000 | ORAL_TABLET | Freq: Once | ORAL | Status: DC

## 2022-10-03 MED ORDER — GLUCAGON HCL RDNA (DIAGNOSTIC) 1 MG IJ SOLR
INTRAMUSCULAR | Status: AC
  Filled 2022-10-03: qty 1

## 2022-10-03 MED ORDER — HYDROCODONE-ACETAMINOPHEN 5-325 MG PO TABS: 1.0000 | ORAL_TABLET | Freq: Four times a day (QID) | ORAL | 0 refills | Status: DC | PRN

## 2022-10-03 MED ORDER — HYDROCODONE-ACETAMINOPHEN 5-325 MG PO TABS
1.0000 | ORAL_TABLET | ORAL | Status: DC | PRN
  Administered 2022-10-03: 2 via ORAL
  Filled 2022-10-03: qty 2

## 2022-10-03 MED ORDER — CEFAZOLIN SODIUM-DEXTROSE 2-4 GM/100ML-% IV SOLN
INTRAVENOUS | Status: AC
  Administered 2022-10-03: 2 g via INTRAVENOUS
  Filled 2022-10-03: qty 100

## 2022-10-03 MED ORDER — MIDAZOLAM HCL 5 MG/5ML IJ SOLN
INTRAMUSCULAR | Status: AC | PRN
  Administered 2022-10-03: 1 mg via INTRAVENOUS

## 2022-10-03 MED ORDER — CEFAZOLIN SODIUM-DEXTROSE 2-4 GM/100ML-% IV SOLN: 2.0000 g | INTRAVENOUS | Status: AC

## 2022-10-03 MED ORDER — FENTANYL CITRATE (PF) 100 MCG/2ML IJ SOLN
INTRAMUSCULAR | Status: AC | PRN
  Administered 2022-10-03 (×2): 25 ug via INTRAVENOUS

## 2022-10-03 MED ORDER — MIDAZOLAM HCL 2 MG/2ML IJ SOLN
INTRAMUSCULAR | Status: AC
  Filled 2022-10-03: qty 2

## 2022-10-03 MED ORDER — MIDAZOLAM HCL 2 MG/2ML IJ SOLN
INTRAMUSCULAR | Status: AC | PRN
  Administered 2022-10-03: 1 mg via INTRAVENOUS

## 2022-10-03 NOTE — H&P (Signed)
Chief Complaint: Patient was seen in consultation today for percutaneous gastrostomy tube   Referring Physician(s): [place]  Supervising Physician: Markus Daft  Patient Status: [facility] - Out-pt  History of Present Illness: Earl Thomas is a 72 y.o. male with protein calorie malnutrition. Has severe interstitial lung disease and was seen by transplant team at [facility]. Recommendation was for improved nutritional status to start, including placement of a PEG for nutritional support in addition to po intake. Pt has been referred to IR for placement of perc G-tube. CT was reviewed and anatomy felt marginal for percutaneous approach but worth an attempt.  Pt scheduled for procedure today.  PMHx, meds, labs, imaging, allergies reviewed. Feels well, no recent fevers, chills, illness. Has been NPO today as directed. Drank barium contrast last night as directed  Per pt, HHRN to assist with TF management has been arranged    Past Medical History:  Diagnosis Date   Abnormal weight loss 12/04/2021   Acute interstitial pneumonitis ([place]) 12/04/2021   Aortic atherosclerosis ([place]) 12/04/2021   Arthralgia of temporomandibular joint 12/04/2021   Arthritis    BMI 26.0-26.9,adult 10/22/2016   Chronic fatigue syndrome 12/04/2021   Dyslipidemia 03/10/2012   Family history of prostate cancer 12/04/2021   Glucose intolerance (impaired glucose tolerance)    Hyperlipidemia    Interstitial lung disease ([place]) 07/16/2019   Long-term current use of high risk medication other than anticoagulant 07/16/2019   Other long term (current) drug therapy 12/04/2021   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  Medications: Prior to Admission medications   Medication Sig Start Date End Date Taking? Authorizing Provider  acetaminophen (TYLENOL) 500 MG tablet Take 1,000 mg by mouth every 6 (six) hours as needed for headache (pain).   Yes [provider]  albuterol (VENTOLIN HFA) 108 (90 Base) MCG/ACT inhaler Inhale 1-2 puffs into the lungs every 6 (six) hours as needed for wheezing or shortness of breath. 06/17/22  Yes Parrett, Tammy S, NP  ascorbic acid (VITAMIN C) 500 MG tablet Take 500 mg by mouth daily.   Yes [provider]  atorvastatin (LIPITOR) 10 MG tablet Take 1 tablet (10 mg total) by mouth daily. 04/30/22  Yes Sagardia, Ines Bloomer, MD  atorvastatin (LIPITOR) 20 MG tablet Take 20 mg by mouth daily.   Yes [provider]  azelastine (ASTELIN) 0.1 % nasal spray Place 2 sprays into both nostrils 2 (two) times daily. Use in each nostril as directed Patient taking differently: Place 2 sprays into both nostrils 2 (two) times daily as needed for rhinitis or allergies (congestion). Use in each nostril as directed 12/25/21  Yes Roney Marion, MD  azelastine (ASTELIN) 0.1 % nasal spray Place 2 sprays into both nostrils 2 (two) times daily. 12/25/21  Yes [provider]  Brimonidine Tartrate (LUMIFY) 0.025 % SOLN Place 1 drop into both eyes every Sunday.   Yes [provider]  cetirizine (ZYRTEC) 10 MG tablet Take 10 mg by mouth daily.   Yes [provider]  diclofenac sodium (VOLTAREN) 1 % GEL Apply 2 g topically 4 (four) times daily. Patient taking differently: Apply 2 g topically 4 (four) times  daily as needed (pain). 09/18/18  Yes Forrest Moron, MD  dicyclomine (BENTYL) 20 MG tablet Take 1 tablet (20 mg total) by mouth 3 (three) times daily as needed. 06/07/22  Yes Long, Wonda Olds, MD  dronabinol (MARINOL) 2.5 MG capsule Take 1 capsule (2.5 mg total) by mouth 2 (two) times daily before a  meal. 04/18/22  Yes Spero Geralds, MD  famotidine (PEPCID) 20 MG tablet Take 1 tablet (20 mg total) by mouth 2 (two) times daily. 04/30/22  Yes Sagardia, Ines Bloomer, MD  Fluticasone Furoate-Vilanterol (BREO ELLIPTA) 50-25 MCG/ACT AEPB Inhale 2 puffs into the lungs 2 (two) times daily. 08/05/22  Yes Sagardia, Ines Bloomer, MD  folic acid ([name]) 1 MG tablet Take 1 mg by mouth every morning. 08/27/19  Yes [provider]  hydrocortisone (PROCTOZONE-HC) 2.5 % rectal cream Place 1 Application rectally 2 (two) times daily. 05/08/22  Yes Sagardia, Ines Bloomer, MD  levocetirizine (XYZAL) 5 MG tablet TAKE 1 TABLET BY MOUTH ONCE DAILY IN THE EVENING 07/09/22  Yes Roney Marion, MD  loperamide (IMODIUM) 2 MG capsule Take 1 capsule (2 mg total) by mouth 4 (four) times daily as needed for diarrhea or loose stools. 06/07/22  Yes Long, Wonda Olds, MD  Naphazoline HCl (CLEAR EYES OP) Place 1 drop into both eyes daily.   Yes [provider]  ondansetron (ZOFRAN-ODT) 4 MG disintegrating tablet Take 1 tablet (4 mg total) by mouth every 8 (eight) hours as needed. 06/07/22  Yes Long, Wonda Olds, MD  Testosterone Enanthate (XYOSTED) 75 MG/0.5ML SOAJ Inject 75 mg into the skin once a week. 08/06/22  Yes Sagardia, Ines Bloomer, MD  vitamin C (ASCORBIC ACID) 500 MG tablet Take 500 mg by mouth daily after supper.   Yes [provider]  aspirin EC 81 MG tablet Take 81 mg by mouth every morning.    [provider]  guaifenesin (ROBITUSSIN) 100 MG/5ML syrup Take 200 mg by mouth 3 (three) times daily as needed for cough or congestion.    [provider]  meloxicam (MOBIC) 7.5 MG tablet Take 1 tablet (7.5 mg total) by mouth daily as needed for pain. 06/04/22   Parrett, Fonnie Mu, NP  methotrexate (RHEUMATREX) 10 MG tablet Take 1 tablet (10 mg total) by mouth once a week. Caution: Chemotherapy. Protect from light. Patient not taking: Reported on 08/27/2022 02/11/22   Clayton Bibles, NP   mirtazapine (REMERON) 15 MG tablet Take 0.5 tablets (7.5 mg total) by mouth at bedtime. 02/07/22   Spero Geralds, MD  nitroGLYCERIN (NITROSTAT) 0.4 MG SL tablet Place 1 tablet (0.4 mg total) under the tongue every 5 (five) minutes as needed. Patient taking differently: Place 0.4 mg under the tongue every 5 (five) minutes as needed for chest pain. 12/27/21 03/27/22  Revankar, Reita Cliche, MD  predniSONE (DELTASONE) 10 MG tablet Take 6 tabs (60mg ) by mouth daily for 1 week, followed by 5 tabs daily for 1 week, then 4 tabs daily for 1 week, then 3 tabs daily, Pulmonary physician to titrate. 01/21/22   Thurnell Lose, MD  riTUXimab (RITUXAN IV) Inject into the vein.    [provider]  sulfamethoxazole-trimethoprim (BACTRIM DS) 800-160 MG tablet Take 1 pill on Monday, Wednesday, and Friday of each week 01/21/22   Thurnell Lose, MD     Family History  Problem Relation Age of Onset   Cancer Father 48       prostate cancer   Diabetes Father  Heart disease Father 70       AMI late 34s   Cancer Mother 67       Breast cancer   Heart disease Mother        CABG at age 5   Diabetes Brother    Heart disease Brother        AMI x 2; CABG   Hyperlipidemia Brother     Social History   Socioeconomic History   Marital status: Married    Spouse name: Not on file   Number of children: 2   Years of education: Not on file   Highest education level: Not on file  Occupational History   Occupation: Professor  Tobacco Use   Smoking status: Former    Types: Pipe    Quit date: 10/06/1988    Years since quitting: 34.0    Passive exposure: Never   Smokeless tobacco: Never   Tobacco comments:    parent smoked in home as a child.  Smoked a pip in college, not daily.  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      Tobacco: pipe in 1980s      Alcohol: none      Exercise: joined [employer]; going 2-3 times per week.        Seatbelt:  100%   Social Determinants of Health   Financial Resource Strain: Low Risk  (11/19/2021)   Overall Financial Resource Strain (CARDIA)    Difficulty of Paying Living Expenses: Not hard at all  Food Insecurity: No Food Insecurity (11/19/2021)   Hunger Vital Sign    Worried About Running Out of Food in the Last Year: Never true    Ran Out of Food in the Last Year: Never true  Transportation Needs: No Transportation Needs (11/19/2021)   PRAPARE - Hydrologist (Medical): No    Lack of Transportation (Non-Medical): No  Physical Activity: Not on file  Stress: No Stress Concern Present (11/19/2021)   [place]    Feeling of Stress : Not at all  Social Connections: [place] (11/19/2021)   Social Connection and Isolation Panel [NHANES]    Frequency of Communication with Friends and Family: More than three times a week    Frequency of Social Gatherings with Friends and Family: More than three times a week    Attends Religious Services: More than 4 times per year    Active Member of [employer] or Organizations: Yes    Attends Music therapist: More than 4 times per year    Marital Status: Married    Review of Systems: A 12 point ROS discussed and pertinent positives are indicated in the HPI above.  All other systems are negative.  Review of Systems  Vital Signs: BP 116/64   Pulse 100   Temp (!) 97.5 F (36.4 C) (Oral)   Resp (!) 22   Ht 5\' 5"  (1.651 m)  Wt 87 lb (39.5 kg)   SpO2 100%   BMI 14.48 kg/m    Physical Exam Constitutional:      General: He is not in acute distress.    Appearance: He is not ill-appearing.     Comments: Cachectic  Cardiovascular:     Rate and Rhythm: Normal rate and regular rhythm.     Heart sounds: Normal heart sounds.  Pulmonary:     Effort: Pulmonary effort is normal. No respiratory distress.     Breath sounds: Normal breath sounds.  Abdominal:     General: There is no distension.     Palpations: Abdomen is soft.     Tenderness: There is no abdominal tenderness.  Neurological:     General: No focal deficit present.     Mental Status: He is oriented to person, place, and time.  Psychiatric:        Mood and Affect: Mood normal.        Thought Content: Thought content normal.     Imaging: No results found.  Labs:  CBC: Recent Labs    01/21/22 0512 01/28/22 1025 06/07/22 1613 10/03/22 1035  WBC 12.9* 12.4* 5.7 5.1  HGB 10.8* 13.2 13.4 13.1  HCT 33.1* 41.8 43.2 43.0  PLT 205 266.0 207 159    COAGS: Recent Labs    01/19/22 0052 10/03/22 1035  INR 1.1 1.0  APTT 31  --     BMP: Recent Labs    01/19/22 0307 01/20/22 0445 01/21/22 0512 01/28/22 1025 06/07/22 1613  NA 140 140 142 137 145  K 4.3 3.9 3.8 4.2 3.7  CL 103 104 109 97 104  CO2 25 25 27  34* 31  GLUCOSE 125* 146* 127* 120* 134*  BUN 9 14 18  25* 18  CALCIUM 8.3* 7.8* 8.0* 9.8 9.4  CREATININE 0.92 0.73 0.88 1.02 1.08  GFRNONAA >60 >60 >60  --  >60    LIVER FUNCTION TESTS: Recent Labs    01/20/22 0445 01/21/22 0512 01/28/22 1025 06/07/22 1613  BILITOT <0.1* 0.2* 0.4 0.8  AST 27 41 42* 34  ALT 16 28 72* 41  ALKPHOS 42 41 49 64  PROT 6.0* 5.8* 7.1 7.8  ALBUMIN 2.3* 2.2* 3.7 4.1     Assessment and Plan: Protein calorie malnutrition/FTT For perc G-tube. Risks and benefits image guided gastrostomy tube placement was discussed with the patient including, but not limited to the need for a barium enema during the procedure, bleeding, infection, peritonitis  and/or damage to adjacent structures.  All of the patient's questions were answered, patient is agreeable to proceed.  Consent signed and in chart.    Electronically Signed: Ascencion Dike, PA-C 10/03/2022, 11:26 AM   I spent a total of 20 minutes in face to face in clinical consultation, greater than 50% of which was counseling/coordinating care for perc G-tube

## 2022-10-03 NOTE — Progress Notes (Signed)
Client's wife states client is still eating and drinking at home and is hungry; given peanut butter and graham crackers and sprite; head of bed elevated 45 degrees on arrival from radiology; no trouble eating and drinking and no new pain; Edson Snowball, PA notified of above and he notified Dr Anselm Pancoast; per Dr Anselm Pancoast client to have ice chips only for 4 hours and no food until tomorrow. Will continue to monitor.

## 2022-10-03 NOTE — Progress Notes (Signed)
Dr Anselm Pancoast and Kathreen Devoid in to see client and no new orders and checked client's abdomen and ok to d/c home

## 2022-10-03 NOTE — Progress Notes (Signed)
Client up in wheelchair for discharge and c/o 8/10 abd pain at g-tube site; Dr Anselm Pancoast notified and order noted and pain medication given

## 2022-10-03 NOTE — Procedures (Signed)
Interventional Radiology Procedure:   Indications: Malnutrition  Procedure: Gastrostomy tube placement  Findings: 20 Fr gastrostomy tube in stomach.    Complications: None     EBL: Minimal  Plan: Ice chips for 4 hours, then clears tonight.  May resume regular diet tomorrow and may start tube feeds tomorrow.    Earl Thomas R. Anselm Pancoast, MD  Pager: [phone]

## 2022-10-04 DIAGNOSIS — I5032 Chronic diastolic (congestive) heart failure: Secondary | ICD-10-CM | POA: Diagnosis not present

## 2022-10-04 DIAGNOSIS — J42 Unspecified chronic bronchitis: Secondary | ICD-10-CM | POA: Diagnosis not present

## 2022-10-04 DIAGNOSIS — J9610 Chronic respiratory failure, unspecified whether with hypoxia or hypercapnia: Secondary | ICD-10-CM | POA: Diagnosis not present

## 2022-10-04 DIAGNOSIS — I700 Atherosclerosis of aorta: Secondary | ICD-10-CM | POA: Diagnosis not present

## 2022-10-04 DIAGNOSIS — M0510 Rheumatoid lung disease with rheumatoid arthritis of unspecified site: Secondary | ICD-10-CM | POA: Diagnosis not present

## 2022-10-04 DIAGNOSIS — E43 Unspecified severe protein-calorie malnutrition: Secondary | ICD-10-CM | POA: Diagnosis not present

## 2022-10-07 ENCOUNTER — Encounter: Payer: Self-pay | Admitting: Skilled Nursing Facility1

## 2022-10-07 ENCOUNTER — Encounter: Payer: Medicare Other | Attending: Emergency Medicine | Admitting: Skilled Nursing Facility1

## 2022-10-07 ENCOUNTER — Telehealth: Payer: Self-pay | Admitting: *Deleted

## 2022-10-07 VITALS — Ht 65.0 in | Wt 86.0 lb

## 2022-10-07 DIAGNOSIS — Z713 Dietary counseling and surveillance: Secondary | ICD-10-CM | POA: Diagnosis not present

## 2022-10-07 DIAGNOSIS — Z681 Body mass index (BMI) 19 or less, adult: Secondary | ICD-10-CM | POA: Insufficient documentation

## 2022-10-07 DIAGNOSIS — R634 Abnormal weight loss: Secondary | ICD-10-CM | POA: Diagnosis not present

## 2022-10-07 DIAGNOSIS — E43 Unspecified severe protein-calorie malnutrition: Secondary | ICD-10-CM | POA: Diagnosis not present

## 2022-10-07 NOTE — Telephone Encounter (Signed)
-----   Message from Ruby Cola, RD sent at 10/07/2022  1:08 PM EDT ----- Kermit Balo Afternoon,  This patient was referred to [facility] in regards to PEG tube feeding initiation, however this is outside of the scope of our center. I spoke with [facility] regarding a needed nutrition prescription for the company to send the patient his TF formulary. I went ahead and spoke with an [place] rep to get a dietitian to facilitate writing the nutrition prescription, but unfortunately since he has no GI issues or medical need for the tube, it won't be covered under Medicare.   I am concerned about this patients malnutrition status as well as the tube not being utilized or flushed since Thursday. This patient needs to be monitored for refeeding syndrome and likely needs to have some some labs drawn of bmet, mag, and phos every 2 weeks. Would you be able to run those labs for Mr. Salzberg?   For now, I am going to recommend that he administer Ensure Plus in his tube once the home health RN teaches the patient how to use his tube. I also advised that he get a 100 mg thiamine OTC as well.   If you see fit to have the patient readmitted due to his severely malnourished status, I am in agreeance of that plan.   Thank you for your consideration,  Sandie Ano RD

## 2022-10-07 NOTE — Telephone Encounter (Signed)
Alexis to get clarification and to make sure we are all on the same page. Prescription for this patient is not needed for tube feeding due to it not being covered by his insurance company?  You will just recommend Ensure Plus once he get a [facility] nurse to come to his home to do tube feeding teaching correct?    Do I need to reach out to [facility] to make sure that they are coming to his [facility] to do the teaching or was they already contacted?

## 2022-10-07 NOTE — Telephone Encounter (Signed)
Please advise on HH order for speech language and nursing for tune feeding, to draw labs and swallow eval

## 2022-10-07 NOTE — Telephone Encounter (Signed)
She mentions severely malnourished status.  If this is the case he will be better off going to the hospital and getting evaluated and possible admission.  I do not have any problems ordering labs but I still think he will be better off getting evaluated in the hospital.

## 2022-10-07 NOTE — Telephone Encounter (Signed)
Thank you :)

## 2022-10-07 NOTE — Progress Notes (Signed)
[facility]    Primary concerns today: Tube Feed Formulary Referral diagnosis: protein calorie malnutrition Preferred learning style: no preference indicated (auditory, visual, hands on, no preference indicated) Learning readiness: change in progress (not ready, contemplating, ready, change in progress)   NUTRITION ASSESSMENT   Clinical Medical Hx: CHF, Interstitial lung disease, GERD, glucose intolerance, hypogonadism, chronic fatigue, renal insufficiency, protein calorie malnutrition Medications: see list Labs: CBC WNL Notable Signs/Symptoms: fatigued,   Lifestyle & Dietary Hx  TF placed Thursday 10/03/22   Today's appointment: Pt presents with in wheelchair with severe muscle wasting of the temples and hair loss. Pt was on oxygen.  Pt wife stated was here to get PEG tube formulary to start tube feeds, informed pt and wife that was out of scope of center. Pt and wife expressed concerns of pt not able to start tube feeds without training and formularies.Pt states he consumes 1-2 boosts/ensures a day and a smoothie with berries, avocado, peanut butter, banana, and weight gain powder throughout the day. Pt's wife states pt does eat some solids and will drink 1-2 boosts per day but pt will forget to eat. Unable to determine pt's actual intake due to pt's wife working part time and not able to be there 100% of the time.   Pt's wife called back to inform that pt will be admitted to hospital tomorrow morning.   Dietitian contacting others outside of visit: First contacted [facility] for formulary prescription with them stating that pt was discharged from transplant center and were unable to help. Contacted nurse of PCP to get nutrition prescription but unable to write. Contacted [facility] nurse who stated that Medicare would not cover tube feed formulary without GI diagnosis. Contacted referring provider to advise labs to be drawn x2/week of b met, mag, and phos due to  refeeding syndrome concerns and advised referring provider due to pts severe malnutrition and wasting coupled with long term inadequate energy intake and no access to tube feed formulary, admission would be recommended. Spoke with [facility] who will contact [facility] to Tube Feed teaching to pt and family. Britany from [facility] stated they could do speech evaluation and draw labs once the referring provider has put in order. Dietitian has advised current provider of this.   Recommendations of dietitian to pt and his wife: Spoke with pt wife and pt willing to buy Ensure and Osmolite 1.5. For now, pt to consume 4 Ensure plus as tolerated and when Osmolite 1.5 is obtained pt's wife states pt will consume 2 ensure plus and 2 Osmolite per financial constraints. (With labs)  100 mg thimain daily OTC   Calroies per kilo: repletion 1400-1600; half the calorie amount until labs come back normal; half carton 4 times a day  Estimated daily fluid intake: unknown Supplements: unknown Sleep: unknown Stress / self-care: high stress Current average weekly physical activity: none   Estimated Energy Needs Calories: 1400-1600  Protein: 70-80 g   NUTRITION DIAGNOSIS  NI-2.1 Inadequate intake As related to diagnosis of protein calorie malnutrition.  As evidenced by temple wasting and BMI of 14.3.   Learning Style & Readiness for Change Teaching method utilized: Visual & Auditory  Demonstrated degree of understanding via: Teach Back  Barriers to learning/adherence to lifestyle change: resources   MONITORING & EVALUATION Dietary intake, weekly physical activity, and weight.  Next Steps  Patient is to call/email with questions or concerns.

## 2022-10-07 NOTE — Telephone Encounter (Signed)
-----   Message from Ruby Cola, RD sent at 10/07/2022  2:23 PM EDT ----- UPDATE:  Just got a call from [facility] [facility]: [place] stating if Doctor Sagardia can put in an order for Speech language and Nursing, [facility] can teach him how to use the tube, draw labs, and do a swallow eval.    ----- Message ----- From: Ruby Cola, RD Sent: 10/07/2022   1:23 PM EDT To: Mylinda Latina, CMA; #  Good Afternoon,  This patient was referred to [facility] in regards to PEG tube feeding initiation, however this is outside of the scope of our center. I spoke with [employer] regarding a needed nutrition prescription for the company to send the patient his TF formulary. I went ahead and spoke with an [facility] rep to get a dietitian to facilitate writing the nutrition prescription, but unfortunately since he has no GI issues or medical need for the tube, it won't be covered under Medicare.   I am concerned about this patients malnutrition status as well as the tube not being utilized or flushed since Thursday. This patient needs to be monitored for refeeding syndrome and likely needs to have some some labs drawn of bmet, mag, and phos every 2 weeks. Would you be able to run those labs for [name]?   For now, I am going to recommend that he administer Ensure Plus in his tube once the home health RN teaches the patient how to use his tube. I also advised that he get a 100 mg thiamine OTC as well.   If you see fit to have the patient readmitted due to his severely malnourished status, I am in agreeance of that plan.   Thank you for your consideration,  Sandie Ano RD

## 2022-10-07 NOTE — Telephone Encounter (Signed)
Please advise on recommendations.

## 2022-10-08 ENCOUNTER — Telehealth: Payer: Self-pay | Admitting: Emergency Medicine

## 2022-10-08 DIAGNOSIS — M0579 Rheumatoid arthritis with rheumatoid factor of multiple sites without organ or systems involvement: Secondary | ICD-10-CM | POA: Diagnosis not present

## 2022-10-08 NOTE — Telephone Encounter (Signed)
Patient's wife called and said she was returning Don's call. She would like a call back at [phone].

## 2022-10-08 NOTE — Telephone Encounter (Signed)
It is in his best interest to go to the emergency department.  Thanks.

## 2022-10-08 NOTE — Telephone Encounter (Signed)
Patient wife called an informed us that she will be taking the patient over to the ED later this evening

## 2022-10-10 ENCOUNTER — Inpatient Hospital Stay (HOSPITAL_COMMUNITY)
Admission: EM | Admit: 2022-10-10 | Discharge: 2022-10-14 | DRG: 641 | Disposition: A | Discharge status: Home / Self Care | Payer: Medicare Other | Attending: Internal Medicine | Admitting: Internal Medicine

## 2022-10-10 ENCOUNTER — Other Ambulatory Visit: Payer: Self-pay

## 2022-10-10 ENCOUNTER — Encounter (HOSPITAL_COMMUNITY): Payer: Self-pay

## 2022-10-10 DIAGNOSIS — J849 Interstitial pulmonary disease, unspecified: Secondary | ICD-10-CM | POA: Diagnosis not present

## 2022-10-10 DIAGNOSIS — J301 Allergic rhinitis due to pollen: Secondary | ICD-10-CM

## 2022-10-10 DIAGNOSIS — Z803 Family history of malignant neoplasm of breast: Secondary | ICD-10-CM | POA: Diagnosis not present

## 2022-10-10 DIAGNOSIS — E782 Mixed hyperlipidemia: Secondary | ICD-10-CM | POA: Diagnosis not present

## 2022-10-10 DIAGNOSIS — M0510 Rheumatoid lung disease with rheumatoid arthritis of unspecified site: Secondary | ICD-10-CM | POA: Diagnosis not present

## 2022-10-10 DIAGNOSIS — Z681 Body mass index (BMI) 19 or less, adult: Secondary | ICD-10-CM | POA: Diagnosis not present

## 2022-10-10 DIAGNOSIS — Z7982 Long term (current) use of aspirin: Secondary | ICD-10-CM

## 2022-10-10 DIAGNOSIS — Z8042 Family history of malignant neoplasm of prostate: Secondary | ICD-10-CM

## 2022-10-10 DIAGNOSIS — J309 Allergic rhinitis, unspecified: Secondary | ICD-10-CM | POA: Diagnosis present

## 2022-10-10 DIAGNOSIS — Z79899 Other long term (current) drug therapy: Secondary | ICD-10-CM

## 2022-10-10 DIAGNOSIS — Z833 Family history of diabetes mellitus: Secondary | ICD-10-CM | POA: Diagnosis not present

## 2022-10-10 DIAGNOSIS — I700 Atherosclerosis of aorta: Secondary | ICD-10-CM | POA: Diagnosis not present

## 2022-10-10 DIAGNOSIS — Z87891 Personal history of nicotine dependence: Secondary | ICD-10-CM

## 2022-10-10 DIAGNOSIS — E785 Hyperlipidemia, unspecified: Secondary | ICD-10-CM | POA: Diagnosis not present

## 2022-10-10 DIAGNOSIS — E43 Unspecified severe protein-calorie malnutrition: Secondary | ICD-10-CM | POA: Diagnosis not present

## 2022-10-10 DIAGNOSIS — J9611 Chronic respiratory failure with hypoxia: Secondary | ICD-10-CM | POA: Diagnosis not present

## 2022-10-10 DIAGNOSIS — Z791 Long term (current) use of non-steroidal anti-inflammatories (NSAID): Secondary | ICD-10-CM

## 2022-10-10 DIAGNOSIS — R634 Abnormal weight loss: Secondary | ICD-10-CM | POA: Diagnosis not present

## 2022-10-10 DIAGNOSIS — K219 Gastro-esophageal reflux disease without esophagitis: Secondary | ICD-10-CM | POA: Diagnosis not present

## 2022-10-10 DIAGNOSIS — R64 Cachexia: Secondary | ICD-10-CM | POA: Diagnosis present

## 2022-10-10 DIAGNOSIS — Z8249 Family history of ischemic heart disease and other diseases of the circulatory system: Secondary | ICD-10-CM | POA: Diagnosis not present

## 2022-10-10 DIAGNOSIS — J84114 Acute interstitial pneumonitis: Secondary | ICD-10-CM | POA: Diagnosis present

## 2022-10-10 DIAGNOSIS — Z9981 Dependence on supplemental oxygen: Secondary | ICD-10-CM

## 2022-10-10 DIAGNOSIS — I5032 Chronic diastolic (congestive) heart failure: Secondary | ICD-10-CM | POA: Diagnosis not present

## 2022-10-10 DIAGNOSIS — Z931 Gastrostomy status: Secondary | ICD-10-CM

## 2022-10-10 DIAGNOSIS — Z431 Encounter for attention to gastrostomy: Secondary | ICD-10-CM | POA: Diagnosis not present

## 2022-10-10 DIAGNOSIS — G9332 Myalgic encephalomyelitis/chronic fatigue syndrome: Secondary | ICD-10-CM | POA: Diagnosis not present

## 2022-10-10 DIAGNOSIS — K224 Dyskinesia of esophagus: Secondary | ICD-10-CM | POA: Diagnosis present

## 2022-10-10 DIAGNOSIS — R54 Age-related physical debility: Secondary | ICD-10-CM | POA: Diagnosis present

## 2022-10-10 DIAGNOSIS — M1990 Unspecified osteoarthritis, unspecified site: Secondary | ICD-10-CM | POA: Diagnosis present

## 2022-10-10 DIAGNOSIS — J9610 Chronic respiratory failure, unspecified whether with hypoxia or hypercapnia: Secondary | ICD-10-CM | POA: Diagnosis not present

## 2022-10-10 DIAGNOSIS — J42 Unspecified chronic bronchitis: Secondary | ICD-10-CM | POA: Diagnosis not present

## 2022-10-10 DIAGNOSIS — Z8349 Family history of other endocrine, nutritional and metabolic diseases: Secondary | ICD-10-CM

## 2022-10-10 LAB — CBC WITH DIFFERENTIAL/PLATELET
Abs Immature Granulocytes: 0.02 10*3/uL (ref 0.00–0.07)
Basophils Absolute: 0 10*3/uL (ref 0.0–0.1)
Basophils Relative: 1 %
Eosinophils Absolute: 0.2 10*3/uL (ref 0.0–0.5)
Eosinophils Relative: 3 %
HCT: 43.2 % (ref 39.0–52.0)
Hemoglobin: 13 g/dL (ref 13.0–17.0)
Immature Granulocytes: 0 %
Lymphocytes Relative: 15 %
Lymphs Abs: 0.9 10*3/uL (ref 0.7–4.0)
MCH: 27.5 pg (ref 26.0–34.0)
MCHC: 30.1 g/dL (ref 30.0–36.0)
MCV: 91.5 fL (ref 80.0–100.0)
Monocytes Absolute: 0.4 10*3/uL (ref 0.1–1.0)
Monocytes Relative: 6 %
Neutro Abs: 4.7 10*3/uL (ref 1.7–7.7)
Neutrophils Relative %: 75 %
Platelets: 170 10*3/uL (ref 150–400)
RBC: 4.72 MIL/uL (ref 4.22–5.81)
RDW: 13.5 % (ref 11.5–15.5)
WBC: 6.3 10*3/uL (ref 4.0–10.5)
nRBC: 0 % (ref 0.0–0.2)

## 2022-10-10 LAB — BASIC METABOLIC PANEL
Anion gap: 11 (ref 5–15)
BUN: 11 mg/dL (ref 8–23)
CO2: 28 mmol/L (ref 22–32)
Calcium: 9.3 mg/dL (ref 8.9–10.3)
Chloride: 104 mmol/L (ref 98–111)
Creatinine, Ser: 0.95 mg/dL (ref 0.61–1.24)
GFR, Estimated: >60 mL/min (ref >60)
Glucose, Bld: 119 mg/dL — ABNORMAL HIGH (ref 70–99)
Potassium: 4.3 mmol/L (ref 3.5–5.1)
Sodium: 143 mmol/L (ref 135–145)

## 2022-10-10 LAB — PHOSPHORUS: Phosphorus: 2.9 mg/dL (ref 2.5–4.6)

## 2022-10-10 LAB — MAGNESIUM: Magnesium: 2 mg/dL (ref 1.7–2.4)

## 2022-10-10 MED ORDER — THIAMINE MONONITRATE 100 MG PO TABS
100.0000 mg | ORAL_TABLET | Freq: Every day | ORAL | Status: DC
  Administered 2022-10-10 – 2022-10-14 (×5): 100 mg via ORAL
  Filled 2022-10-10 (×5): qty 1

## 2022-10-10 MED ORDER — LACTATED RINGERS IV SOLN: INTRAVENOUS | Status: AC

## 2022-10-10 MED ORDER — FOLIC ACID 1 MG PO TABS
1.0000 mg | ORAL_TABLET | Freq: Once | ORAL | Status: AC
  Administered 2022-10-10: 1 mg via ORAL
  Filled 2022-10-10: qty 1

## 2022-10-10 MED ORDER — ACETAMINOPHEN 650 MG RE SUPP: 650.0000 mg | Freq: Four times a day (QID) | RECTAL | Status: DC | PRN

## 2022-10-10 MED ORDER — MELATONIN 3 MG PO TABS: 3.0000 mg | ORAL_TABLET | Freq: Every evening | ORAL | Status: DC | PRN

## 2022-10-10 MED ORDER — ACETAMINOPHEN 325 MG PO TABS: 650.0000 mg | ORAL_TABLET | Freq: Four times a day (QID) | ORAL | Status: DC | PRN

## 2022-10-10 MED ORDER — ONDANSETRON HCL 4 MG/2ML IJ SOLN: 4.0000 mg | Freq: Four times a day (QID) | INTRAMUSCULAR | Status: DC | PRN

## 2022-10-10 MED ORDER — ADULT MULTIVITAMIN W/MINERALS CH
1.0000 | ORAL_TABLET | Freq: Once | ORAL | Status: AC
  Administered 2022-10-10: 1 via ORAL
  Filled 2022-10-10: qty 1

## 2022-10-10 NOTE — ED Notes (Signed)
ED TO INPATIENT HANDOFF REPORT  ED Nurse Name and Phone #: [phone]  S Name/Age/Gender Earl Thomas 72 y.o. male Room/Bed: H021C/H021C  Code Status   Code Status: Full Code  Home/SNF/Other Home Patient oriented to: self, place, time, and situation Is this baseline? Yes   Triage Complete: Triage complete  Chief Complaint Protein-calorie malnutrition, severe [E43]  Triage Note Pt c/o losing weight for the past year, but the last 6 mos has gotten worse. Pt had peg tube placed last Thurs, but pt's PCP stated he needed to come here to be evaluated, because he keeps losing weight and they want to know what feeding pt should be getting through tube.   Allergies No Known Allergies  Level of Care/Admitting Diagnosis ED Disposition     ED Disposition  Admit   Condition  --   Comment  Hospital Area: [facility] [[id number]]  Level of Care: Med-Surg [16]  May place patient in observation at [facility] or [facility] if equivalent level of care is available:: No  Covid Evaluation: Asymptomatic - no recent exposure (last 10 days) testing not required  Diagnosis: Protein-calorie malnutrition, severe [id number]  Admitting Physician: Rhetta Mura [id number]  Attending Physician: Rhetta Mura [id number]          B Medical/Surgery History Past Medical History:  Diagnosis Date   Abnormal weight loss 12/04/2021   Acute interstitial pneumonitis 12/04/2021   Aortic atherosclerosis 12/04/2021   Arthralgia of temporomandibular joint 12/04/2021   Arthritis    BMI 26.0-26.9,adult 10/22/2016   Chronic fatigue syndrome 12/04/2021   Dyslipidemia 03/10/2012   Family history of prostate cancer 12/04/2021   Glucose intolerance (impaired glucose tolerance)    Hyperlipidemia    Interstitial lung disease 07/16/2019   Long-term current use of high risk medication other than anticoagulant 07/16/2019   Other long term (current) drug therapy 12/04/2021   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 LUQ 10/03/22  1221  LUQ  7            Intake/Output Last 24 hours No intake or output data in the 24 hours ending 10/10/22 2026  Labs/Imaging Results for orders placed or performed during the hospital encounter of 10/10/22 (from the past 48 hour(s))  Basic metabolic panel     Status: Abnormal   Collection Time: 10/10/22 11:25 AM  Result Value Ref Range   Sodium 143 135 - 145 mmol/L   Potassium 4.3 3.5 - 5.1 mmol/L   Chloride 104 98 - 111 mmol/L   CO2 28 22 - 32 mmol/L   Glucose, Bld 119 (H) 70 - 99 mg/dL    Comment: Glucose reference range applies only to samples taken after fasting for at least 8 hours.   BUN 11 8 - 23 mg/dL   Creatinine, Ser 0.95 0.61 - 1.24 mg/dL   Calcium 9.3 8.9 - 10.3 mg/dL  GFR, Estimated >60 >60 mL/min    Comment: (NOTE) Calculated using the CKD-EPI Creatinine Equation (2021)    Anion gap 11 5 - 15    Comment: Performed at [facility], [place] [address], [place], [place] [address]  CBC with Differential     Status: None   Collection Time: 10/10/22 11:25 AM  Result Value Ref Range   WBC 6.3 4.0 - 10.5 K/uL   RBC 4.72 4.22 - 5.81 MIL/uL   Hemoglobin 13.0 13.0 - 17.0 g/dL   HCT 43.2 39.0 - 52.0 %   MCV 91.5 80.0 - 100.0 fL   MCH 27.5 26.0 - 34.0 pg   MCHC 30.1 30.0 - 36.0 g/dL   RDW 13.5 11.5 - 15.5 %    Platelets 170 150 - 400 K/uL   nRBC 0.0 0.0 - 0.2 %   Neutrophils Relative % 75 %   Neutro Abs 4.7 1.7 - 7.7 K/uL   Lymphocytes Relative 15 %   Lymphs Abs 0.9 0.7 - 4.0 K/uL   Monocytes Relative 6 %   Monocytes Absolute 0.4 0.1 - 1.0 K/uL   Eosinophils Relative 3 %   Eosinophils Absolute 0.2 0.0 - 0.5 K/uL   Basophils Relative 1 %   Basophils Absolute 0.0 0.0 - 0.1 K/uL   Immature Granulocytes 0 %   Abs Immature Granulocytes 0.02 0.00 - 0.07 K/uL    Comment: Performed at [facility], [address] [place], [place] [address]  Magnesium     Status: None   Collection Time: 10/10/22 11:25 AM  Result Value Ref Range   Magnesium 2.0 1.7 - 2.4 mg/dL    Comment: Performed at [facility], [place] [address] [place], [place] [address]  Phosphorus     Status: None   Collection Time: 10/10/22 11:25 AM  Result Value Ref Range   Phosphorus 2.9 2.5 - 4.6 mg/dL    Comment: Performed at [facility] [address] [place], [place] [address]   No results found.  Pending Labs Unresulted Labs (From admission, onward)     Start     Ordered   10/11/22 0500  CBC with Differential/Platelet  Tomorrow morning,   R        10/10/22 1939   10/11/22 0500  Comprehensive metabolic panel  Tomorrow morning,   R        10/10/22 1939   10/11/22 0500  Magnesium  Tomorrow morning,   R        10/10/22 1939   10/11/22 0500  Phosphorus  Tomorrow morning,   R        10/10/22 1939   10/10/22 1939  Prealbumin  Add-on,   AD        10/10/22 1939            Vitals/Pain Today's Vitals   10/10/22 1117 10/10/22 1118 10/10/22 1453 10/10/22 1900  BP: 109/72  121/76 105/61  Pulse: 86  66 64  Resp: (!) 22  20 20   Temp:   97.8 F (36.6 C) 97.8 F (36.6 C)  TempSrc:    Oral  SpO2: 96%  98% 98%  Height:  5\' 5"  (1.651 m)    PainSc:  1       Isolation Precautions No active isolations  Medications Medications  thiamine (VITAMIN B1) tablet 100 mg (100 mg Oral Given 10/10/22 1617)   acetaminophen (TYLENOL) tablet 650 mg (has no administration in time range)    Or  acetaminophen (TYLENOL) suppository 650 mg (has no administration in time range)  melatonin tablet 3 mg (has no administration in time range)  ondansetron (ZOFRAN) injection 4 mg (has no administration in time range)  lactated ringers infusion (has no administration in time range)  folic acid ([name]) tablet 1 mg (1 mg Oral Given 10/10/22 1617)  multivitamin with minerals tablet 1 tablet (1 tablet Oral Given 10/10/22 1618)    Mobility walks with device     Focused Assessments Neuro Assessment Handoff:  Swallow screen pass? Yes          Neuro Assessment:   Neuro Checks:      Has TPA been given? No If patient is a Neuro Trauma and patient is going to OR before floor call report to [facility] nurse: [phone] or [phone]   R Recommendations: See Admitting Provider Note  Report given to:   Additional Notes: pt has been declining in health over the last few weeks has lost a significant amount of weight in the last month.  Has a peg tube but according to wife was not instructed on what or how much to give him. Admitted over night for observation. Has a 17 in the left forearm

## 2022-10-10 NOTE — ED Notes (Signed)
Awaiting call back from dietician. Patient resting on stretcher in hall. NAD.

## 2022-10-10 NOTE — ED Provider Notes (Signed)
[facility] Provider Note   CSN: [id number] Arrival date & time: 10/10/22  1109     History  Chief Complaint  Patient presents with   Weight Loss    Earl Thomas is a 72 y.o. male with medical history of interstitial lung disease currently wearing oxygen, acute interstitial pneumonitis, chronic fatigue syndrome, abnormal weight loss, pituitary macroadenoma, GERD, CHF, chronic bronchitis, renal insufficiency rheumatoid lung disease with rheumatoid arthritis.  Patient presents to ED for evaluation of nutritional weight loss as well as feeding tube assistance.  The patient reports over the last 1 year he has been experiencing weight loss despite the fact that he has been eating.  The patient reports that last Thursday he had a feeding tube placed by interventional radiology.  This feeding tube was ordered by his PCP.  The patient reports that ever since having the feeding tube placed no one has been able to show him how to use it, he has not received his feeding tube formulary, he has not flushed it since Thursday.  The patient was seen on 4/1 by registered dietitian with [facility] who advised patient that providing PEG tube formulary to start his feeding tube feeds was outside of the scope at the center.  On chart review, it seems like the patient has been consuming 1-2 boost/ensures a day and is smooth with berries, avocado, peanut butter, banana and weight gain powder throughout the day.  Patient wife reports that the patient does eat some solids and will drink 1-2 boost per day but sometimes will forget.  Patient reports that this morning he ate breakfast, quiche.  Patient denies any dysphagia.  Patient reports that his weight loss is secondary to malnutrition and this is the reason that the feeding tube was placed.  The patient reports that he is unable to have his prescription feeding tube formula prescribed to him because his insurance would not  cover it due to the fact he has no GI diagnosis necessitating feeding tube formulary.  Patient advises that his PCP sent him here for "evaluation".  Patient wife reports that the main goal today is to receive feeding tube formulary, instructions on how to use feeding tube.  HPI     Home Medications Prior to Admission medications   Medication Sig Start Date End Date Taking? Authorizing Provider  acetaminophen (TYLENOL) 500 MG tablet Take 1,000 mg by mouth every 6 (six) hours as needed for headache (pain).    [provider]  albuterol (VENTOLIN HFA) 108 (90 Base) MCG/ACT inhaler Inhale 1-2 puffs into the lungs every 6 (six) hours as needed for wheezing or shortness of breath. 06/17/22   Parrett, Fonnie Mu, NP  ascorbic acid (VITAMIN C) 500 MG tablet Take 500 mg by mouth daily.    [provider]  aspirin EC 81 MG tablet Take 81 mg by mouth every morning.    [provider]  atorvastatin (LIPITOR) 10 MG tablet Take 1 tablet (10 mg total) by mouth daily. 04/30/22   Horald Pollen, MD  atorvastatin (LIPITOR) 20 MG tablet Take 20 mg by mouth daily.    [provider]  azelastine (ASTELIN) 0.1 % nasal spray Place 2 sprays into both nostrils 2 (two) times daily. Use in each nostril as directed Patient taking differently: Place 2 sprays into both nostrils 2 (two) times daily as needed for rhinitis or allergies (congestion). Use in each nostril as directed 12/25/21   Roney Marion, MD  azelastine (ASTELIN) 0.1 % nasal spray Place 2 sprays into both nostrils 2 (two) times daily. 12/25/21   [provider]  Brimonidine Tartrate (LUMIFY) 0.025 % SOLN Place 1 drop into both eyes every Sunday.    [provider]  cetirizine (ZYRTEC) 10 MG tablet Take 10 mg by mouth daily.    [provider]  diclofenac sodium (VOLTAREN) 1 % GEL Apply 2 g topically 4 (four) times daily. Patient taking differently: Apply 2 g topically 4 (four) times daily as  needed (pain). 09/18/18   Forrest Moron, MD  dicyclomine (BENTYL) 20 MG tablet Take 1 tablet (20 mg total) by mouth 3 (three) times daily as needed. 06/07/22   Long, Wonda Olds, MD  dronabinol (MARINOL) 2.5 MG capsule Take 1 capsule (2.5 mg total) by mouth 2 (two) times daily before a meal. 04/18/22   Spero Geralds, MD  famotidine (PEPCID) 20 MG tablet Take 1 tablet (20 mg total) by mouth 2 (two) times daily. 04/30/22   Horald Pollen, MD  Fluticasone Furoate-Vilanterol (BREO ELLIPTA) 50-25 MCG/ACT AEPB Inhale 2 puffs into the lungs 2 (two) times daily. 08/05/22   Horald Pollen, MD  folic acid ([name]) 1 MG tablet Take 1 mg by mouth every morning. 08/27/19   [provider]  guaifenesin (ROBITUSSIN) 100 MG/5ML syrup Take 200 mg by mouth 3 (three) times daily as needed for cough or congestion.    [provider]  HYDROcodone-acetaminophen (NORCO) 5-325 MG tablet Take 1 tablet by mouth every 6 (six) hours as needed for moderate pain. 10/03/22   Ascencion Dike, PA-C  hydrocortisone (PROCTOZONE-HC) 2.5 % rectal cream Place 1 Application rectally 2 (two) times daily. 05/08/22   Horald Pollen, MD  levocetirizine (XYZAL) 5 MG tablet TAKE 1 TABLET BY MOUTH ONCE DAILY IN THE EVENING 07/09/22   Roney Marion, MD  loperamide (IMODIUM) 2 MG capsule Take 1 capsule (2 mg total) by mouth 4 (four) times daily as needed for diarrhea or loose stools. 06/07/22   Long, Wonda Olds, MD  meloxicam (MOBIC) 7.5 MG tablet Take 1 tablet (7.5 mg total) by mouth daily as needed for pain. 06/04/22   Parrett, Fonnie Mu, NP  methotrexate (RHEUMATREX) 10 MG tablet Take 1 tablet (10 mg total) by mouth once a week. Caution: Chemotherapy. Protect from light. Patient not taking: Reported on 08/27/2022 02/11/22   Clayton Bibles, NP  mirtazapine (REMERON) 15 MG tablet Take 0.5 tablets (7.5 mg total) by mouth at bedtime. 02/07/22   Spero Geralds, MD  Naphazoline HCl (CLEAR EYES OP) Place 1 drop into both  eyes daily.    [provider]  nitroGLYCERIN (NITROSTAT) 0.4 MG SL tablet Place 1 tablet (0.4 mg total) under the tongue every 5 (five) minutes as needed. Patient taking differently: Place 0.4 mg under the tongue every 5 (five) minutes as needed for chest pain. 12/27/21 03/27/22  Revankar, Reita Cliche, MD  ondansetron (ZOFRAN-ODT) 4 MG disintegrating tablet Take 1 tablet (4 mg total) by mouth every 8 (eight) hours as needed. 06/07/22   Long, Wonda Olds, MD  predniSONE (DELTASONE) 10 MG tablet Take 6 tabs (60mg ) by mouth daily for 1 week, followed by 5 tabs daily for 1 week, then 4 tabs daily for 1 week, then 3 tabs daily, Pulmonary physician to titrate. 01/21/22   Thurnell Lose, MD  riTUXimab (RITUXAN IV) Inject into the vein.    [provider]  sulfamethoxazole-trimethoprim (BACTRIM DS) 800-160 MG tablet Take 1  pill on Monday, Wednesday, and Friday of each week 01/21/22   Thurnell Lose, MD  Testosterone Enanthate (XYOSTED) 75 MG/0.5ML SOAJ Inject 75 mg into the skin once a week. 08/06/22   Horald Pollen, MD  vitamin C (ASCORBIC ACID) 500 MG tablet Take 500 mg by mouth daily after supper.    [provider]      Allergies    Patient has no known allergies.    Review of Systems   Review of Systems  Constitutional:  Positive for appetite change and unexpected weight change.  Gastrointestinal:  Negative for abdominal pain, nausea and vomiting.  All other systems reviewed and are negative.   Physical Exam Updated Vital Signs BP 105/61 (BP Location: Right Arm)   Pulse 64   Temp 97.8 F (36.6 C) (Oral)   Resp 20   Ht 5\' 5"  (1.651 m)   SpO2 98%   BMI 14.31 kg/m  Physical Exam Constitutional:      Appearance: He is cachectic. He is not ill-appearing, toxic-appearing or diaphoretic.  HENT:     Head: Normocephalic and atraumatic.     Nose: Nose normal.     Mouth/Throat:     Mouth: Mucous membranes are moist.     Pharynx: Oropharynx is clear.  Eyes:      Extraocular Movements: Extraocular movements intact.     Conjunctiva/sclera: Conjunctivae normal.     Pupils: Pupils are equal, round, and reactive to light.  Cardiovascular:     Rate and Rhythm: Normal rate and regular rhythm.  Pulmonary:     Effort: Pulmonary effort is normal.     Breath sounds: Normal breath sounds. No wheezing.  Abdominal:     General: Abdomen is flat. Bowel sounds are normal.     Palpations: Abdomen is soft.     Tenderness: There is no abdominal tenderness.     Comments: G tube in place.  Site clean dry and intact  Musculoskeletal:     Cervical back: Normal range of motion and neck supple. No tenderness.     Right lower leg: No edema.     Left lower leg: No edema.  Skin:    General: Skin is warm and dry.     Capillary Refill: Capillary refill takes less than 2 seconds.  Neurological:     Mental Status: He is alert and oriented to person, place, and time. Mental status is at baseline.     ED Results / Procedures / Treatments   Labs (all labs ordered are listed, but only abnormal results are displayed) Labs Reviewed  BASIC METABOLIC PANEL - Abnormal; Notable for the following components:      Result Value   Glucose, Bld 119 (*)    All other components within normal limits  CBC WITH DIFFERENTIAL/PLATELET  MAGNESIUM  PHOSPHORUS  PREALBUMIN  CBC WITH DIFFERENTIAL/PLATELET  COMPREHENSIVE METABOLIC PANEL  MAGNESIUM  PHOSPHORUS    EKG None  Radiology No results found.  Procedures Procedures   Medications Ordered in ED Medications  thiamine (VITAMIN B1) tablet 100 mg (100 mg Oral Given 10/10/22 1617)  acetaminophen (TYLENOL) tablet 650 mg (has no administration in time range)    Or  acetaminophen (TYLENOL) suppository 650 mg (has no administration in time range)  melatonin tablet 3 mg (has no administration in time range)  ondansetron (ZOFRAN) injection 4 mg (has no administration in time range)  lactated ringers infusion (has no administration  in time range)  folic acid ([name]) tablet 1 mg (1  mg Oral Given 10/10/22 1617)  multivitamin with minerals tablet 1 tablet (1 tablet Oral Given 10/10/22 1618)    ED Course/ Medical Decision Making/ A&P    Medical Decision Making Amount and/or Complexity of Data Reviewed Labs: ordered.  Risk OTC drugs.   72 year old male presents to ED for evaluation.  Please see HPI for further details.  On my examination the patient is afebrile and nontachycardic.  His lung sounds are clear bilaterally, he is not hypoxic.  Abdomen soft and compressible, G-tube in site that is clean dry and intact, no signs of acute infection.  Neurologically at baseline.  No peripheral edema.  Alert and oriented x 3.  Patient is very cachectic.  Patient CBC unremarkable without leukocytosis or anemia.  Patient BMP unremarkable, no electrolyte derangement, stable creatinine.  Patient phosphorus within normal limits.  Magnesium within normal limits.  Patient provided 1 mg folic acid, 1 multivitamin tablet, 100 mg thiamine.  I reached out to registered dietitian, Ubaldo Glassing, who patient is seen in the office for further information.  Ubaldo Glassing states that this patient is unable to receive his tube feeds secondary to not having a GI diagnosis that his insurance is willing to cover tube feed formulary for.  Patient and patient wife have reached out to numerous home health aides and agencies were unable to assist at this time.  The patient and the patient wife are very unclear on what they should be doing, who is supposed to be advising them how to use the G-tube.  The patient and the patient wife are concerned that they do not have tube feeds at home, they have no way of knowing how to use the G-tube.  Patient and patient wife report that they were advised to come to the ED for evaluation and possible admission.  Reached out to on-call registered dietitian asking if they could come down and advised patient on how to use G-tube.  On-call  registered dietitian reports that there is no RDs currently physically onsite.  She has advised me to reach out to social work.  Reached out to social work, Chiropodist has assisted.  After discussion with Landis Martins, patient family, patient would most likely benefit from admission so resources can be utilized as an inpatient.  The patient will need instruction on how to use G-tube, how to feed through G-tube, how to flush G-tube.  Patient will need assistance receiving his tube feeding formulary.  Will need assistance setting up home health.  Discussed case with Dr. Velia Meyer, hospitalist, who has agreed to admit the patient.  Patient and patient family member amenable to plan.  Patient stable on admission.   Final Clinical Impression(s) / ED Diagnoses Final diagnoses:  Unexplained weight loss    Rx / DC Orders ED Discharge Orders     None         Lawana Chambers 10/10/22 2027    Cristie Hem, MD 10/10/22 2124

## 2022-10-10 NOTE — ED Notes (Signed)
Lab called. Mag and Phos added on to specimen already in lab.

## 2022-10-10 NOTE — Care Management (Signed)
ED RNCM met with patient and spouse at bedside, patient and wife reports that patient had a g-tube placed and they were not given any clear instructions on how to initiate the process. Patient was referred to a nutritionist who was not able to make recommendation and get the coordinate with the provider to get the feeding tube supplement ordered. Home health services was establish with [facility] by patient's PCP as per wife.  Patient and wife are concerned that patient is not receiving adequate nutrient due to receiving tube feeding.   Updated EDP C. Groce PA-C.

## 2022-10-10 NOTE — ED Triage Notes (Signed)
Pt c/o losing weight for the past year, but the last 6 mos has gotten worse. Pt had peg tube placed last Thurs, but pt's PCP stated he needed to come here to be evaluated, because he keeps losing weight and they want to know what feeding pt should be getting through tube.

## 2022-10-10 NOTE — ED Notes (Signed)
Patient swallowed medications without difficulty. Sprite given per patient request.

## 2022-10-10 NOTE — H&P (Signed)
History and Physical      Earl Thomas ZOX:[id number] DOB: 02-03-1951 DOA: 10/10/2022  PCP: Georgina Quint, MD  Patient coming from: home   I have personally briefly reviewed patient's old medical records in [facility]  Chief Complaint: Weight loss  HPI: Earl Thomas is a 72 y.o. male with medical history significant for interstitial lung disease, chronic hypoxic respiratory failure on baseline 5 L continuous nasal cannula, hyperlipidemia, allergic rhinitis, who is admitted to [facility] on 10/10/2022 with severe protein calorie malnutrition after presenting from home to [facility] ED complaining of weight loss.   The patient reports progressive unintentional weight loss over the course of the last year.  He notes that this is occurred in spite of efforts to increase his oral intake, and notes that he has recently been on Marinol to help further stimulate his appetite.  Denies any known history of underlying malignancy.  In order to provide further supplementation of his nutritional status, he underwent G-tube placement via interventional radiology at [facility] on 10/03/2022.  However, he reports that he is unsure how to administer tube feeds via the G-tube.  Additionally, he has bumped into issues with affordability of the supplements to be administered via the G-tube.  Consequently, he notes that he has not been using the G-tube at all following G-tube placement on 10/03/2022.  He requests assistance with education and with affordability relating to G-tube supplementation efforts.     ED Course:  Vital signs in the ED were notable for the following: Afebrile; heart rate 60s to 80s; systolic blood pressures in the low 100s to 120s; respiratory rate 18-22, oxygen saturation 98% on his baseline 5 L continuous nasal cannula.  Labs were notable for the following: BMP notable for the following: Sodium 143, bicarbonate 28, creatinine 0.95 compared to most recent prior  serum creatinine data point of 1.08 on 06/07/2022, glucose 119.  CBC notable for evidence of count 6300, hemoglobin 13.  While in the ED, the following were administered: Oral multivitamin x 1 dose.  Subsequently, the patient was admitted for further evaluation management of his severe protein calorie malnutrition, including assistance with education regarding his recently placed G-tube.     Review of Systems: As per HPI otherwise 10 point review of systems negative.   Past Medical History:  Diagnosis Date   Abnormal weight loss 12/04/2021   Acute interstitial pneumonitis 12/04/2021   Aortic atherosclerosis 12/04/2021   Arthralgia of temporomandibular joint 12/04/2021   Arthritis    BMI 26.0-26.9,adult 10/22/2016   Chronic fatigue syndrome 12/04/2021   Dyslipidemia 03/10/2012   Family history of prostate cancer 12/04/2021   Glucose intolerance (impaired glucose tolerance)    Hyperlipidemia    Interstitial lung disease 07/16/2019   Long-term current use of high risk medication other than anticoagulant 07/16/2019   Other long term (current) drug therapy 12/04/2021   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 His smoking use included pipe. He has never been exposed to tobacco smoke. He has never used smokeless tobacco. He reports that he does not drink alcohol and  does not use drugs.   No Known Allergies  Family History  Problem Relation Age of Onset   Cancer Father 48       prostate cancer   Diabetes Father    Heart disease Father 66       AMI late 6s   Cancer Mother 13       Breast cancer    Heart disease Mother        CABG at age 20   Diabetes Brother    Heart disease Brother        AMI x 2; CABG   Hyperlipidemia Brother     Family history reviewed and not pertinent    Prior to Admission medications   Medication Sig Start Date End Date Taking? Authorizing Provider  acetaminophen (TYLENOL) 500 MG tablet Take 1,000 mg by mouth every 6 (six) hours as needed for headache (pain).    [provider]  albuterol (VENTOLIN HFA) 108 (90 Base) MCG/ACT inhaler Inhale 1-2 puffs into the lungs every 6 (six) hours as needed for wheezing or shortness of breath. 06/17/22   Parrett, Virgel Bouquet, NP  ascorbic acid (VITAMIN C) 500 MG tablet Take 500 mg by mouth daily.    [provider]  aspirin EC 81 MG tablet Take 81 mg by mouth every morning.    [provider]  atorvastatin (LIPITOR) 10 MG tablet Take 1 tablet (10 mg total) by mouth daily. 04/30/22   Georgina Quint, MD  atorvastatin (LIPITOR) 20 MG tablet Take 20 mg by mouth daily.    [provider]  azelastine (ASTELIN) 0.1 % nasal spray Place 2 sprays into both nostrils 2 (two) times daily. Use in each nostril as directed Patient taking differently: Place 2 sprays into both nostrils 2 (two) times daily as needed for rhinitis or allergies (congestion). Use in each nostril as directed 12/25/21   Ferol Luz, MD  azelastine (ASTELIN) 0.1 % nasal spray Place 2 sprays into both nostrils 2 (two) times daily. 12/25/21   [provider]  Brimonidine Tartrate (LUMIFY) 0.025 % SOLN Place 1 drop into both eyes every Sunday.    [provider]  cetirizine (ZYRTEC) 10 MG tablet Take 10 mg by mouth daily.    [provider]  diclofenac sodium (VOLTAREN) 1 % GEL Apply 2 g topically 4 (four) times daily. Patient taking differently: Apply 2 g topically 4 (four) times daily as needed (pain). 09/18/18   Doristine Bosworth, MD  dicyclomine (BENTYL) 20 MG tablet Take 1 tablet (20 mg total) by  mouth 3 (three) times daily as needed. 06/07/22   Long, Arlyss Repress, MD  dronabinol (MARINOL) 2.5 MG capsule Take 1 capsule (2.5 mg total) by mouth 2 (two) times daily before a meal. 04/18/22   Charlott Holler, MD  famotidine (PEPCID) 20 MG tablet Take 1 tablet (20 mg total) by mouth 2 (two) times daily. 04/30/22   Georgina Quint, MD  Fluticasone Furoate-Vilanterol (BREO ELLIPTA) 50-25 MCG/ACT AEPB Inhale 2 puffs into the lungs 2 (two) times daily. 08/05/22   Georgina Quint, MD  folic acid ([name]) 1 MG tablet Take 1 mg by mouth every morning. 08/27/19   [provider]  guaifenesin (ROBITUSSIN) 100 MG/5ML syrup Take 200 mg by mouth 3 (three) times daily as needed for cough or congestion.    [provider]  HYDROcodone-acetaminophen (NORCO) 5-325 MG tablet Take 1 tablet by mouth every 6 (six) hours as needed for moderate pain. 10/03/22  Brayton El, PA-C  hydrocortisone (PROCTOZONE-HC) 2.5 % rectal cream Place 1 Application rectally 2 (two) times daily. 05/08/22   Georgina Quint, MD  levocetirizine (XYZAL) 5 MG tablet TAKE 1 TABLET BY MOUTH ONCE DAILY IN THE EVENING 07/09/22   Ferol Luz, MD  loperamide (IMODIUM) 2 MG capsule Take 1 capsule (2 mg total) by mouth 4 (four) times daily as needed for diarrhea or loose stools. 06/07/22   Long, Arlyss Repress, MD  meloxicam (MOBIC) 7.5 MG tablet Take 1 tablet (7.5 mg total) by mouth daily as needed for pain. 06/04/22   Parrett, Virgel Bouquet, NP  methotrexate (RHEUMATREX) 10 MG tablet Take 1 tablet (10 mg total) by mouth once a week. Caution: Chemotherapy. Protect from light. Patient not taking: Reported on 08/27/2022 02/11/22   Noemi Chapel, NP  mirtazapine (REMERON) 15 MG tablet Take 0.5 tablets (7.5 mg total) by mouth at bedtime. 02/07/22   Charlott Holler, MD  Naphazoline HCl (CLEAR EYES OP) Place 1 drop into both eyes daily.    [provider]  nitroGLYCERIN (NITROSTAT) 0.4 MG SL tablet Place 1 tablet (0.4 mg total)  under the tongue every 5 (five) minutes as needed. Patient taking differently: Place 0.4 mg under the tongue every 5 (five) minutes as needed for chest pain. 12/27/21 03/27/22  Revankar, Aundra Dubin, MD  ondansetron (ZOFRAN-ODT) 4 MG disintegrating tablet Take 1 tablet (4 mg total) by mouth every 8 (eight) hours as needed. 06/07/22   Long, Arlyss Repress, MD  predniSONE (DELTASONE) 10 MG tablet Take 6 tabs (60mg ) by mouth daily for 1 week, followed by 5 tabs daily for 1 week, then 4 tabs daily for 1 week, then 3 tabs daily, Pulmonary physician to titrate. 01/21/22   Leroy Sea, MD  riTUXimab (RITUXAN IV) Inject into the vein.    [provider]  sulfamethoxazole-trimethoprim (BACTRIM DS) 800-160 MG tablet Take 1 pill on Monday, Wednesday, and Friday of each week 01/21/22   Leroy Sea, MD  Testosterone Enanthate (XYOSTED) 75 MG/0.5ML SOAJ Inject 75 mg into the skin once a week. 08/06/22   Georgina Quint, MD  vitamin C (ASCORBIC ACID) 500 MG tablet Take 500 mg by mouth daily after supper.    [provider]     Objective    Physical Exam: Vitals:   10/10/22 1118 10/10/22 1453 10/10/22 1900 10/10/22 2027  BP:  121/76 105/61 98/61  Pulse:  66 64   Resp:  20 20 (!) 24  Temp:  97.8 F (36.6 C) 97.8 F (36.6 C) 98.4 F (36.9 C)  TempSrc:   Oral Oral  SpO2:  98% 98% 100%  Height: 5\' 5"  (1.651 m)       General: appears to be stated age; alert, oriented Skin: warm, dry, no rash Head:  AT/[place] Mouth:  Oral mucosa membranes appear dry, normal dentition Neck: supple; trachea midline Heart:  RRR; did not appreciate any M/R/G Lungs: CTAB, did not appreciate any wheezes, rales, or rhonchi Abdomen: + BS; soft, ND, NT; g-tube noted. Vascular: 2+ pedal pulses b/l; 2+ radial pulses b/l Extremities: no peripheral edema, no muscle wasting Neuro: strength and sensation intact in upper and lower extremities b/l    Labs on Admission: I have personally reviewed following labs  and imaging studies  CBC: Recent Labs  Lab 10/10/22 1125  WBC 6.3  NEUTROABS 4.7  HGB 13.0  HCT 43.2  MCV 91.5  PLT 170   Basic Metabolic Panel: Recent Labs  Lab  10/10/22 1125  NA 143  K 4.3  CL 104  CO2 28  GLUCOSE 119*  BUN 11  CREATININE 0.95  CALCIUM 9.3  MG 2.0  PHOS 2.9   GFR: Estimated Creatinine Clearance: 40.5 mL/min (by C-G formula based on SCr of 0.95 mg/dL). Liver Function Tests: No results for input(s): "AST", "ALT", "ALKPHOS", "BILITOT", "PROT", "ALBUMIN" in the last 168 hours. No results for input(s): "LIPASE", "AMYLASE" in the last 168 hours. No results for input(s): "AMMONIA" in the last 168 hours. Coagulation Profile: No results for input(s): "INR", "PROTIME" in the last 168 hours. Cardiac Enzymes: No results for input(s): "CKTOTAL", "CKMB", "CKMBINDEX", "TROPONINI" in the last 168 hours. BNP (last 3 results) No results for input(s): "PROBNP" in the last 8760 hours. HbA1C: No results for input(s): "HGBA1C" in the last 72 hours. CBG: No results for input(s): "GLUCAP" in the last 168 hours. Lipid Profile: No results for input(s): "CHOL", "HDL", "LDLCALC", "TRIG", "CHOLHDL", "LDLDIRECT" in the last 72 hours. Thyroid Function Tests: No results for input(s): "TSH", "T4TOTAL", "FREET4", "T3FREE", "THYROIDAB" in the last 72 hours. Anemia Panel: No results for input(s): "VITAMINB12", "FOLATE", "FERRITIN", "TIBC", "IRON", "RETICCTPCT" in the last 72 hours. Urine analysis:    Component Value Date/Time   COLORURINE YELLOW 01/18/2022 2033   APPEARANCEUR CLEAR 01/18/2022 2033   LABSPEC 1.010 01/18/2022 2033   PHURINE 6.5 01/18/2022 2033   GLUCOSEU NEGATIVE 01/18/2022 2033   HGBUR NEGATIVE 01/18/2022 2033   BILIRUBINUR NEGATIVE 01/18/2022 2033   BILIRUBINUR negative 05/14/2017 1557   BILIRUBINUR Negative 02/22/2015 1434   KETONESUR NEGATIVE 01/18/2022 2033   PROTEINUR NEGATIVE 01/18/2022 2033   UROBILINOGEN 0.2 05/14/2017 1557   NITRITE NEGATIVE  01/18/2022 2033   LEUKOCYTESUR NEGATIVE 01/18/2022 2033    Radiological Exams on Admission: No results found.    Assessment/Plan   Principal Problem:   Protein-calorie malnutrition, severe Active Problems:   Interstitial lung disease   Hyperlipidemia   GERD (gastroesophageal reflux disease)   Chronic hypoxic respiratory failure   Allergic rhinitis     #) Protein calorie malnutrition: Suspect that this is severe in nature, in the setting of 1 year of progressive unintentional weight loss, presenting weight of 38.6 kg associated BMI of 14.2 and spite of use of appetite stimulants as an outpatient, including Marinol.  Will add on prealbumin level to further quantify the severity of his presenting protein calorie malnutrition.  Recently underwent G-tube placement to further supplement his nutritional status, but patient requests additional education on use and maintenance of the G-tube prior to him feeling comfortable with self administering the supplements via the G-tube.  Additionally, he has bumped into obstacles from a affordability standpoint and procuring the G-tube supplements.  Consequently, we will attempt to reconcile both the educational and affordability aspects that are posing obstacles to enhancing his nutritional status via G-tube supplementation.  Will pursue dietary consultation for this purpose as well as transition of care consultation.  Overall, the source of his protein calorie malnutrition and persistent, progressive weight loss over the course the last year is unclear to me.  Denies any known underlying history of malignancy.  Potential contribution from his interstitial lung disease.  Plan: I have placed dietary consultation for assistance with evaluating the patient's nutritional status as well as providing further recommendations regarding supplements to optimize his nutritional status as well as education for the patient on use and maintenance relating to his G-tube.   I also placed transitional care consultation for assistance with affordability options relating to G-tube  supplementation.  Check TSH.  Check prealbumin.  Repeat CMP in the morning.  Monitor strict I's and O's and daily weights.  Also ordered some continuous IV fluids in the form of lactated Ringer's at 50 cc/h.  Check serum magnesium and phosphorus levels.  Continue home Marinol.          #) Chronic hypoxic respiratory failure in the setting of interstitial lung disease.  On his reported baseline 5 L nasal cannula, maintaining oxygen saturations in the high 90s.  No evidence of acute exacerbation thereof.  Is on prn albuterol inhaler as an outpatient, although it does not appear that he is on a scheduled respiratory medications at home.  Plan: Prn albuterol nebulizer.  Follow-up result of serum magnesium and phosphorus levels, as above.               #) Hyperlipidemia: documented h/o such. On atorvastatin as outpatient.   Plan: continue home statin.                #) Allergic Rhinitis: documented h/o such, on scheduled Zyrtec as an outpatient, in the absence of any intranasal corticosteroids as outpatient.    Plan: cont home Zyrtec.                #) GERD: documented h/o such; on Pepcid as outpatient.   Plan: continue home H2 blocker.      DVT prophylaxis: SCD's   Code Status: Full code Family Communication: none Disposition Plan: Per Rounding Team Consults called: Dietary consult and TOC consult placed, as further detailed above;  Admission status: Observation     I SPENT GREATER THAN 75  MINUTES IN CLINICAL CARE TIME/MEDICAL DECISION-MAKING IN COMPLETING THIS ADMISSION.      Chaney BornJustin B Mathias Bogacki DO [facility]  From 7PM - 7AM   10/10/2022, 8:33 PM

## 2022-10-11 ENCOUNTER — Encounter (HOSPITAL_COMMUNITY): Payer: Self-pay | Admitting: Internal Medicine

## 2022-10-11 DIAGNOSIS — Z8249 Family history of ischemic heart disease and other diseases of the circulatory system: Secondary | ICD-10-CM | POA: Diagnosis not present

## 2022-10-11 DIAGNOSIS — E43 Unspecified severe protein-calorie malnutrition: Secondary | ICD-10-CM | POA: Diagnosis present

## 2022-10-11 DIAGNOSIS — J849 Interstitial pulmonary disease, unspecified: Secondary | ICD-10-CM | POA: Diagnosis not present

## 2022-10-11 DIAGNOSIS — Z833 Family history of diabetes mellitus: Secondary | ICD-10-CM | POA: Diagnosis not present

## 2022-10-11 DIAGNOSIS — G9332 Myalgic encephalomyelitis/chronic fatigue syndrome: Secondary | ICD-10-CM | POA: Diagnosis present

## 2022-10-11 DIAGNOSIS — Z9981 Dependence on supplemental oxygen: Secondary | ICD-10-CM | POA: Diagnosis not present

## 2022-10-11 DIAGNOSIS — Z931 Gastrostomy status: Secondary | ICD-10-CM | POA: Diagnosis not present

## 2022-10-11 DIAGNOSIS — K224 Dyskinesia of esophagus: Secondary | ICD-10-CM | POA: Diagnosis present

## 2022-10-11 DIAGNOSIS — J42 Unspecified chronic bronchitis: Secondary | ICD-10-CM | POA: Diagnosis not present

## 2022-10-11 DIAGNOSIS — Z7982 Long term (current) use of aspirin: Secondary | ICD-10-CM | POA: Diagnosis not present

## 2022-10-11 DIAGNOSIS — J9610 Chronic respiratory failure, unspecified whether with hypoxia or hypercapnia: Secondary | ICD-10-CM | POA: Diagnosis not present

## 2022-10-11 DIAGNOSIS — Z681 Body mass index (BMI) 19 or less, adult: Secondary | ICD-10-CM | POA: Diagnosis not present

## 2022-10-11 DIAGNOSIS — M1990 Unspecified osteoarthritis, unspecified site: Secondary | ICD-10-CM | POA: Diagnosis present

## 2022-10-11 DIAGNOSIS — I5032 Chronic diastolic (congestive) heart failure: Secondary | ICD-10-CM | POA: Diagnosis not present

## 2022-10-11 DIAGNOSIS — Z431 Encounter for attention to gastrostomy: Secondary | ICD-10-CM | POA: Diagnosis not present

## 2022-10-11 DIAGNOSIS — Z791 Long term (current) use of non-steroidal anti-inflammatories (NSAID): Secondary | ICD-10-CM | POA: Diagnosis not present

## 2022-10-11 DIAGNOSIS — R64 Cachexia: Secondary | ICD-10-CM | POA: Diagnosis present

## 2022-10-11 DIAGNOSIS — J9611 Chronic respiratory failure with hypoxia: Secondary | ICD-10-CM | POA: Diagnosis present

## 2022-10-11 DIAGNOSIS — R54 Age-related physical debility: Secondary | ICD-10-CM | POA: Diagnosis present

## 2022-10-11 DIAGNOSIS — Z79899 Other long term (current) drug therapy: Secondary | ICD-10-CM | POA: Diagnosis not present

## 2022-10-11 DIAGNOSIS — J84114 Acute interstitial pneumonitis: Secondary | ICD-10-CM | POA: Diagnosis present

## 2022-10-11 DIAGNOSIS — E785 Hyperlipidemia, unspecified: Secondary | ICD-10-CM | POA: Diagnosis present

## 2022-10-11 DIAGNOSIS — J309 Allergic rhinitis, unspecified: Secondary | ICD-10-CM | POA: Diagnosis present

## 2022-10-11 DIAGNOSIS — M0510 Rheumatoid lung disease with rheumatoid arthritis of unspecified site: Secondary | ICD-10-CM | POA: Diagnosis present

## 2022-10-11 DIAGNOSIS — K219 Gastro-esophageal reflux disease without esophagitis: Secondary | ICD-10-CM | POA: Diagnosis present

## 2022-10-11 DIAGNOSIS — I700 Atherosclerosis of aorta: Secondary | ICD-10-CM | POA: Diagnosis present

## 2022-10-11 DIAGNOSIS — R634 Abnormal weight loss: Secondary | ICD-10-CM | POA: Diagnosis present

## 2022-10-11 DIAGNOSIS — Z803 Family history of malignant neoplasm of breast: Secondary | ICD-10-CM | POA: Diagnosis not present

## 2022-10-11 DIAGNOSIS — Z8042 Family history of malignant neoplasm of prostate: Secondary | ICD-10-CM | POA: Diagnosis not present

## 2022-10-11 DIAGNOSIS — Z87891 Personal history of nicotine dependence: Secondary | ICD-10-CM | POA: Diagnosis not present

## 2022-10-11 HISTORY — DX: Allergic rhinitis, unspecified: J30.9

## 2022-10-11 LAB — PREALBUMIN: Prealbumin: 17 mg/dL — ABNORMAL LOW (ref 18–38)

## 2022-10-11 LAB — COMPREHENSIVE METABOLIC PANEL
ALT: 37 U/L (ref 0–44)
AST: 32 U/L (ref 15–41)
Albumin: 2.9 g/dL — ABNORMAL LOW (ref 3.5–5.0)
Alkaline Phosphatase: 42 U/L (ref 38–126)
Anion gap: 9 (ref 5–15)
BUN: 11 mg/dL (ref 8–23)
CO2: 33 mmol/L — ABNORMAL HIGH (ref 22–32)
Calcium: 9 mg/dL (ref 8.9–10.3)
Chloride: 99 mmol/L (ref 98–111)
Creatinine, Ser: 1.01 mg/dL (ref 0.61–1.24)
GFR, Estimated: >60 mL/min (ref >60)
Glucose, Bld: 85 mg/dL (ref 70–99)
Potassium: 4 mmol/L (ref 3.5–5.1)
Sodium: 141 mmol/L (ref 135–145)
Total Bilirubin: 0.4 mg/dL (ref 0.3–1.2)
Total Protein: 5.8 g/dL — ABNORMAL LOW (ref 6.5–8.1)

## 2022-10-11 LAB — CBC WITH DIFFERENTIAL/PLATELET
Abs Immature Granulocytes: 0.02 10*3/uL (ref 0.00–0.07)
Basophils Absolute: 0 10*3/uL (ref 0.0–0.1)
Basophils Relative: 1 %
Eosinophils Absolute: 0.4 10*3/uL (ref 0.0–0.5)
Eosinophils Relative: 9 %
HCT: 36.9 % — ABNORMAL LOW (ref 39.0–52.0)
Hemoglobin: 11.8 g/dL — ABNORMAL LOW (ref 13.0–17.0)
Immature Granulocytes: 0 %
Lymphocytes Relative: 22 %
Lymphs Abs: 1.1 10*3/uL (ref 0.7–4.0)
MCH: 28.3 pg (ref 26.0–34.0)
MCHC: 32 g/dL (ref 30.0–36.0)
MCV: 88.5 fL (ref 80.0–100.0)
Monocytes Absolute: 0.4 10*3/uL (ref 0.1–1.0)
Monocytes Relative: 8 %
Neutro Abs: 2.9 10*3/uL (ref 1.7–7.7)
Neutrophils Relative %: 60 %
Platelets: 167 10*3/uL (ref 150–400)
RBC: 4.17 MIL/uL — ABNORMAL LOW (ref 4.22–5.81)
RDW: 13.2 % (ref 11.5–15.5)
WBC: 4.8 10*3/uL (ref 4.0–10.5)
nRBC: 0 % (ref 0.0–0.2)

## 2022-10-11 LAB — GLUCOSE, CAPILLARY
Glucose-Capillary: 105 mg/dL — ABNORMAL HIGH (ref 70–99)
Glucose-Capillary: 98 mg/dL (ref 70–99)

## 2022-10-11 LAB — PHOSPHORUS: Phosphorus: 3.1 mg/dL (ref 2.5–4.6)

## 2022-10-11 LAB — TSH: TSH: 4.489 u[IU]/mL (ref 0.350–4.500)

## 2022-10-11 LAB — MAGNESIUM: Magnesium: 1.9 mg/dL (ref 1.7–2.4)

## 2022-10-11 MED ORDER — FAMOTIDINE 20 MG PO TABS
20.0000 mg | ORAL_TABLET | Freq: Two times a day (BID) | ORAL | Status: DC
  Administered 2022-10-11 – 2022-10-14 (×7): 20 mg via ORAL
  Filled 2022-10-11 (×7): qty 1

## 2022-10-11 MED ORDER — ASPIRIN 81 MG PO TBEC
81.0000 mg | DELAYED_RELEASE_TABLET | Freq: Every morning | ORAL | Status: DC
  Administered 2022-10-11 – 2022-10-14 (×4): 81 mg via ORAL
  Filled 2022-10-11 (×4): qty 1

## 2022-10-11 MED ORDER — FREE WATER
125.0000 mL | Status: DC
  Administered 2022-10-11 – 2022-10-14 (×18): 125 mL

## 2022-10-11 MED ORDER — OSMOLITE 1.5 CAL PO LIQD
1000.0000 mL | ORAL | Status: DC
  Administered 2022-10-11 – 2022-10-12 (×2): 1000 mL
  Filled 2022-10-11 (×3): qty 1000

## 2022-10-11 MED ORDER — ENSURE ENLIVE PO LIQD
237.0000 mL | Freq: Two times a day (BID) | ORAL | Status: DC
  Administered 2022-10-11 – 2022-10-14 (×7): 237 mL via ORAL

## 2022-10-11 MED ORDER — ALBUTEROL SULFATE (2.5 MG/3ML) 0.083% IN NEBU: 2.5000 mg | INHALATION_SOLUTION | RESPIRATORY_TRACT | Status: DC | PRN

## 2022-10-11 MED ORDER — DRONABINOL 2.5 MG PO CAPS
2.5000 mg | ORAL_CAPSULE | Freq: Two times a day (BID) | ORAL | Status: DC
  Administered 2022-10-11 – 2022-10-14 (×8): 2.5 mg via ORAL
  Filled 2022-10-11 (×8): qty 1

## 2022-10-11 MED ORDER — LORATADINE 10 MG PO TABS
10.0000 mg | ORAL_TABLET | Freq: Every day | ORAL | Status: DC
  Administered 2022-10-11 – 2022-10-14 (×4): 10 mg via ORAL
  Filled 2022-10-11 (×4): qty 1

## 2022-10-11 MED ORDER — ATORVASTATIN CALCIUM 10 MG PO TABS
10.0000 mg | ORAL_TABLET | Freq: Every day | ORAL | Status: DC
  Administered 2022-10-11 – 2022-10-14 (×4): 10 mg via ORAL
  Filled 2022-10-11 (×4): qty 1

## 2022-10-11 MED ORDER — PROSOURCE TF20 ENFIT COMPATIBL EN LIQD
60.0000 mL | Freq: Every day | ENTERAL | Status: DC
  Administered 2022-10-11 – 2022-10-14 (×4): 60 mL
  Filled 2022-10-11 (×4): qty 60

## 2022-10-11 NOTE — Progress Notes (Addendum)
Initial Nutrition Assessment  DOCUMENTATION CODES:   Severe malnutrition in context of chronic illness, Underweight  INTERVENTION:  Initiate tube feeding via PEG tube: Start Osmolite 1.5 at 14ml and advance by 33ml q12h to a goal rate of 40 ml/h (960 ml per day) Prosource TF20 60 ml once daily Free water flushes q4h   Provides 1520 kcal, 80 gm protein, 1482 ml free water daily  Earl Thomas is at high risk for refeeding syndrome, monitor magnesium, potassium, and phosphorus BID for at least 4 days, MD to replete as needed  Continue thiamine supplementation  Ensure Plus High Protein po BID, each supplement provides 350 kcal and 20 grams of protein.  MVI with minerals daily  Once tolerating goal tube feeding regimen can transition to home bolus regimen: 1 carton ( ) Osmolite 1.5 QID 49ml water flushes before and after each bolus Provides 1420 kcal, 60g protein, free water OR 4 cartons Ensure Plus High Protein QID Provides 1400 kcal and 80g protein   NUTRITION DIAGNOSIS:   Severe Malnutrition related to chronic illness (ILD) as evidenced by severe fat depletion, severe muscle depletion, percent weight loss, energy intake < or equal to 75% for > or equal to 1 month.  GOAL:   Patient will meet greater than or equal to 90% of their needs  MONITOR:   PO intake, Labs, Weight trends  REASON FOR ASSESSMENT:   Consult Assessment of nutrition requirement/status, Enteral/tube feeding initiation and management (Assistance with initiation of tube feeds (recently placed G-tube), along with patient education (patient needs education in the operation and maintenance of the G tube). Thank you very much.)  ASSESSMENT:   Earl Thomas admitted with weight loss and severe protein calorie malnutrition. S/p PEG tube placement on 3/28. PMH significant for interstitial lung disease, chronic hypoxic respiratory failure, HLD, allergic rhinitis.  Earl Thomas is s/p PEG tube placement on 3/28 however had not  been initiated on TF.   Spoke with Earl Thomas at bedside. No family present at time of visit.   Earl Thomas states that he is followed by [facility] for w/u of lung transplant however is requiring nutrition optimization to qualify for procedure.   He states that he was diagnosed with RA a few years ago. At that time he was started on medication which he then recalls having had developed into ILD. He noticed with the initiation of different medications, he began losing weight and has not stopped since then despite medication adjustments. His portion sizes have decreased significantly. He used to be able to eat full meals. He also reports having a cough which results in him "coughing" up everything he eats. He states this happens with every meal and has been occurring for at least the last year. He is s/p MBS outpatient which showed mild esophageal dysmotility.   Dietary recalls includes-  Breakfast: grits, 1.5 pieces of sausage, or 1 piece of bacon, or 1 boiled egg with half a slice of toast, or oatmeal Snack: 1 Boost Lunch: half of a [place] sandwich Snack: half of a smoothie with dry milk powder, PB, blueberries, strawberries and avocado Dinner: pork chop, spaghetti or roast beef  Uncertain how much nutrition Earl Thomas is absorbing prior to reported episodes of "coughing" up food with each meal.   Earl Thomas sates that he has lost at least 20 lbs within the last year. He recalls weighing about 124-130 lbs. Reviewed weight history and noted ongoing continued weight loss since the end of 2022. Earl Thomas noted to have had a weight loss of 25.3%  within the last year which is clinically significant for time frame.   Earl Thomas physically unable to meet his nutrition needs orally. Earl Thomas reports episodes of "coughing" up food with all meals leading to significantly decreased oral intake likely poor absorption of nutrients that are consumed. On exam, Earl Thomas with severe muscle and fat depletions and weight hx demonstrated significant loss of body mass. Nutrition  workup is consistent with chronic undernutrition and meets diagnostic criteria for severe malnutrition. Given these reasons, it is imperative that Earl Thomas receive 100% of his estimated nutrition needs via PEG tube at this time to optimize his nutrition status and prevent further deterioration.   Medications: marinol, pepcid, thiamine  Labs reviewed  NUTRITION - FOCUSED PHYSICAL EXAM:  Flowsheet Row Most Recent Value  Orbital Region Severe depletion  Upper Arm Region Severe depletion  Thoracic and Lumbar Region Severe depletion  Buccal Region Severe depletion  Temple Region Severe depletion  Clavicle Bone Region Severe depletion  Clavicle and Acromion Bone Region Severe depletion  Scapular Bone Region Severe depletion  Dorsal Hand Severe depletion  Patellar Region Severe depletion  Anterior Thigh Region Severe depletion  Posterior Calf Region Severe depletion  Edema (RD Assessment) None  Hair Other (Comment)  [Earl Thomas endorses lots of hair loss]  Eyes Reviewed  Mouth Reviewed  Skin Reviewed  Nails Reviewed       Diet Order:   Diet Order             Diet regular Room service appropriate? Yes; Fluid consistency: Thin  Diet effective now                   EDUCATION NEEDS:   Education needs have been addressed  Skin:  Skin Assessment: Reviewed RN Assessment  Last BM:  4/5  Height:   Ht Readings from Last 1 Encounters:  10/10/22 (S) 5\' 5"  (1.651 m)    Weight:   Wt Readings from Last 1 Encounters:  10/10/22 38.6 kg    Ideal Body Weight:  61.8 kg  BMI:  Body mass index is 14.16 kg/m.  Estimated Nutritional Needs:   Kcal:  1500-1700  Protein:  75-90g  Fluid:  >/=1.5L  Drusilla Kanner, RDN, LDN Clinical Nutrition

## 2022-10-11 NOTE — Progress Notes (Addendum)
PROGRESS NOTE Earl Thomas  ZOX:[id number] DOB: 04/08/1951 DOA: 10/10/2022 PCP: Georgina QuintSagardia, [name], MD  Brief Narrative/Hospital Course: 72 y.o. male with medical history significant for interstitial lung disease, chronic hypoxic respiratory failure on baseline 5 L continuous nasal cannula, hyperlipidemia, allergic rhinitis admitted with protein calorie malnutrition weight loss. He had progressive unintentional weight loss over the course of the last years/p G tube placed on  10/03/2022 but he is unsure how to administer tube feeds via the G-tube so came to ED. In the ED hemodynamically stable although soft blood pressure, on baseline 5 L nasal cannula no hypoxia, electrolytes stable. Patient admitted dietitian , social worker consulted   Subjective: Patient seen and examined this morning he is on 5 L nasal cannula resting comfortably has no complaints.   Assessment and Plan: Principal Problem:   Protein-calorie malnutrition, severe Active Problems:   Interstitial lung disease   Hyperlipidemia   GERD (gastroesophageal reflux disease)   Chronic hypoxic respiratory failure   Allergic rhinitis   Severe malnutrition:bmi 14. Patient had recent PEG tube placed for nutritional augmentation, reports.  Dietitian consulted TOC consulted for education, A-fib management as well as supplies arrangement.  Apparently patient reports PCP was unable to arrange as outpatient.  He Has severe interstitial lung disease and [facility] who recommended for improved nutritional status to start, so peg was done as outpatient.  Patient had MBS outpatient and has diagnosis of mild esophageal dysmotility -dysphagia unspecified.  Mild esophageal dysmotility with poor oral intake and malnutrition plan is to continue PEG tube feeding and some oral feeding  Chronic hypoxic respiratory failure/interstitial lung disease on 5 L nasal cannula, stable, continue the same Hyperlipidemia on  Lipitor Allergic rhinitis on Zyrtec GERD on Pepcid   DVT prophylaxis: SCDs Start: 10/10/22 1936 Code Status:   Code Status: Full Code Family Communication: plan of care discussed with patient at bedside. Patient status is: Inpatient because of severe malnutrition Level of care: Med-Surg   Dispo: The patient is from: home            Anticipated disposition: home on rounds patient is educated/and is comfortable doing tube feeding after ensuring he is tolerating Objective: Vitals last 24 hrs: Vitals:   10/10/22 2140 10/11/22 0144 10/11/22 0539 10/11/22 0754  BP:  97/63 (!) 107/59 96/63  Pulse:  (!) 53 (!) 59 88  Resp:  18 18 17   Temp:  98.2 F (36.8 C) 97.6 F (36.4 C) 99.4 F (37.4 C)  TempSrc:  Oral Oral Oral  SpO2:  100% 100% 100%  Weight: 38.6 kg     Height: (S) 5\' 5"  (1.651 m)      Weight change:   Physical Examination: General exam: alert awake, thin frail cachectic  HEENT:Oral mucosa moist, Ear/Nose WNL grossly Respiratory system: bilaterally clear BS, no use of accessory muscle Cardiovascular system: S1 & S2 +, No JVD. Gastrointestinal system: Abdomen soft,NT,ND, BS+ Nervous System:Alert, awake, moving extremities. Extremities: LE edema neg,distal peripheral pulses palpable.  Skin: No rashes,no icterus. MSK: Normal muscle bulk,tone, power  Medications reviewed:  Scheduled Meds:  aspirin EC  81 mg Oral q morning   atorvastatin  10 mg Oral Daily   dronabinol  2.5 mg Oral BID AC   famotidine  20 mg Oral BID   loratadine  10 mg Oral Daily   thiamine  100 mg Oral Daily  Continuous Infusions:  Diet Order  Diet regular Room service appropriate? Yes; Fluid consistency: Thin  Diet effective now                  Intake/Output Summary (Last 24 hours) at 10/11/2022 1300 Last data filed at 10/11/2022 0756 Gross per 24 hour  Intake 699.03 ml  Output 450 ml  Net 249.03 ml   Net IO Since Admission: 249.03 mL [10/11/22 1300]  Wt Readings from Last 3  Encounters:  10/10/22 38.6 kg  10/07/22 39 kg  10/03/22 39.5 kg     Unresulted Labs (From admission, onward)    None     Data Reviewed: I have personally reviewed following labs and imaging studies CBC: Recent Labs  Lab 10/10/22 1125 10/11/22 0352  WBC 6.3 4.8  NEUTROABS 4.7 2.9  HGB 13.0 11.8*  HCT 43.2 36.9*  MCV 91.5 88.5  PLT 170 167   Basic Metabolic Panel: Recent Labs  Lab 10/10/22 1125 10/11/22 0352  NA 143 141  K 4.3 4.0  CL 104 99  CO2 28 33*  GLUCOSE 119* 85  BUN 11 11  CREATININE 0.95 1.01  CALCIUM 9.3 9.0  MG 2.0 1.9  PHOS 2.9 3.1   GFR: Estimated Creatinine Clearance: 37.7 mL/min (by C-G formula based on SCr of 1.01 mg/dL). Liver Function Tests: Recent Labs  Lab 10/11/22 0352  AST 32  ALT 37  ALKPHOS 42  BILITOT 0.4  PROT 5.8*  ALBUMIN 2.9*   Recent Labs    10/11/22 0352  TSH 4.489  Sepsis Labs: No results for input(s): "PROCALCITON", "LATICACIDVEN" in the last 168 hours.  No results found for this or any previous visit (from the past 240 hour(s)).  Antimicrobials: Anti-infectives (From admission, onward)    None      Culture/Microbiology    Component Value Date/Time   SDES  01/18/2022 2034    IN/OUT CATH URINE Performed at [facility], [address] [place], [place] [address]    [facility]  01/18/2022 2034    NONE Performed at [facility], [address], [place], [place] [address]    CULT  01/18/2022 2034    NO GROWTH Performed at [facility], [address], [place], [place] [address]    REPTSTATUS 01/20/2022 FINAL 01/18/2022 2034  Radiology Studies: No results found.   LOS: 0 days   Lanae Boast, MD [facility]  10/11/2022, 1:00 PM

## 2022-10-11 NOTE — Hospital Course (Addendum)
72 y.o. male with medical history significant for interstitial lung disease, chronic hypoxic respiratory failure on baseline 5 L continuous nasal cannula, hyperlipidemia, allergic rhinitis admitted with protein calorie malnutrition weight loss. He had progressive unintentional weight loss over the course of the last years/p G tube placed on  10/03/2022 but he is unsure how to administer tube feeds via the G-tube so came to ED. In the ED hemodynamically stable although soft blood pressure, on baseline 5 L nasal cannula no hypoxia, electrolytes stable. Patient admitted dietitian , social worker consulted. Seen by dietitian> placed on continuous tube feeding which she has been tolerating very well.  Patient is physically unable to meet his nutritional needs orally, has episodes of coughing up food with meals leading to significant decreased oral intake, previous MBS showed esophageal dysmotility, he will need augmentation of nutritional status in anticipation for his lung transplant. TOC consulted to help with resources/Tube Feeds and once arranged he will be discharged home.

## 2022-10-11 NOTE — TOC Initial Note (Addendum)
Transition of Care [facility]) - Initial/Assessment Note    Patient Details  Name: Earl Thomas MRN: [id number] Date of Birth: 1951-01-25  Transition of Care [facility]) CM/SW Contact:    Gala Lewandowsky, RN Phone Number: 10/11/2022, 3:39 PM  Clinical Narrative: Case Manager received a consult for outpatient tube feeds for home. Patient recently had a peg tube placed outpatient and is currently active with [facility] for RN services. Patient will need [facility] RN order and F2F once stable. Patient presents for weight loss. PTA patient was from home with spouse. Case Manager did speak with patient and spouse and they have concerns regarding cost for tube feedings. Case Manager has been working with [employer] to see if Medicare Part B will cover the tube feeds. [employer] states diagnosis included for Medicare Guidelines: GI obstructions,  malabsorption, and swallowing issues- dysphagia. If the patient does not have any of these qualifying diagnoses documented for Medicare Guidelines; therefore, Medicare will not pay for the tube feeds. The team discussed the diagnosis for esophageal dysmotility that prevents oral intake and [employer] was looking into this; however, patient currently has a diet. Dietician has placed additional  recommendations in the notes if the Osmolite cannot be paid for via Medicare. Weekend Case Manager has been added as well. Case Manager has updated patient, spouse and [employer] with the following information.                 1615 10-11-22 Case Manager received notification that the RD with [employer] is reviewing the case as well to see if they can pull out anything that insurance will cover. [employer] will continue to follow throughout the weekend.   Expected Discharge Plan: Home w Home Health Services Barriers to Discharge: Continued Medical Work up   Patient Goals and CMS Choice Patient states their goals for this hospitalization and ongoing recovery are:: plan will be to return home    Expected Discharge Plan and Services In-house Referral: NA Discharge Planning Services: CM Consult Post Acute Care Choice: Home Health, Resumption of Svcs/PTA Provider Living arrangements for the past 2 months: Single Family Home   HH Arranged: RN HH Agency: [facility] ([facility]) Date HH Agency Contacted: 10/11/22 Time HH Agency Contacted: 1200 Representative spoke with at [facility] Agency: Morrie Sheldon  Prior Living Arrangements/Services Living arrangements for the past 2 months: Single Family Home Lives with:: Spouse Patient language and need for interpreter reviewed:: Yes Do you feel safe going back to the place where you live?: Yes      Need for Family Participation in Patient Care: Yes (Comment) Care giver support system in place?: Yes (comment)   Criminal Activity/Legal Involvement Pertinent to Current Situation/Hospitalization: No - Comment as needed  Activities of Daily Living Home Assistive Devices/Equipment: Shower chair without back, Eyeglasses, Oxygen ADL Screening (condition at time of admission) Patient's cognitive ability adequate to safely complete daily activities?: Yes Is the patient deaf or have difficulty hearing?: No Does the patient have difficulty seeing, even when wearing glasses/contacts?: No Does the patient have difficulty concentrating, remembering, or making decisions?: No Patient able to express need for assistance with ADLs?: No Does the patient have difficulty dressing or bathing?: Yes Independently performs ADLs?: Yes (appropriate for developmental age) Does the patient have difficulty walking or climbing stairs?: No Weakness of Legs: Both Weakness of Arms/Hands: None  Permission Sought/Granted Permission sought to share information with : Family Supports, Magazine features editor, Case Automotive engineer granted to share info w AGENCY:  [facility], [employer].        Emotional Assessment Appearance:: Appears stated  age Attitude/Demeanor/Rapport: Engaged Affect (typically observed): Appropriate Orientation: : Oriented to Situation, Oriented to Self, Oriented to Place, Oriented to  Time Alcohol / Substance Use: Not Applicable Psych Involvement: No (comment)  Admission diagnosis:  Unexplained weight loss [R63.4] Protein-calorie malnutrition, severe [E43] Patient Active Problem List   Diagnosis Date Noted   Allergic rhinitis 10/11/2022   Chronic hypoxic respiratory failure 08/30/2022   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   Chronic bronchitis 06/17/2022   Protein-calorie malnutrition, severe 06/17/2022   GERD (gastroesophageal reflux disease) 01/19/2022   Chronic diastolic CHF (congestive heart failure) 01/19/2022   Arthritis 12/25/2021   Hyperlipidemia 12/25/2021   Chronic fatigue syndrome 12/04/2021   Other long term (current) drug therapy 12/04/2021   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   Arthralgia of temporomandibular joint 12/04/2021   Abnormal weight loss 12/04/2021   Family history of prostate cancer 12/04/2021   Aortic atherosclerosis 12/04/2021   Skin lesion of chest wall 12/04/2021   Interstitial lung disease 07/16/2019   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   Long-term current use of high risk medication other than anticoagulant 07/16/2019   BMI 26.0-26.9,adult 10/22/2016   Renal insufficiency 04/29/2016   Glucose intolerance (impaired glucose tolerance) 04/29/2016   Dyslipidemia 03/10/2012   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 [place] [address] Phone: [phone] Fax: [phone]  [facility] [place] - [address] [facility] [address] Portal to Registered [address] [place] [id number] Phone: [phone] Fax: [phone]  [facility] [address] - [place], [place] - [address] [place] [address] Phone: [phone] Fax: [phone]     Social Determinants of Health (SDOH) Social History: SDOH Screenings   Food Insecurity: No Food Insecurity (10/10/2022)  Housing: Low Risk  (10/10/2022)  Transportation Needs: No Transportation Needs (10/10/2022)  Utilities: Not At Risk (10/10/2022)  Alcohol Screen: Low Risk  (11/19/2021)  Depression (PHQ2-9): Low Risk  (08/06/2022)  Financial Resource Strain: Low Risk  (11/19/2021)  Social Connections: Socially Integrated (11/19/2021)  Stress: No Stress Concern Present (11/19/2021)  Tobacco Use: Medium Risk (10/11/2022)   SDOH Interventions:     Readmission Risk Interventions     No data to display

## 2022-10-12 DIAGNOSIS — E43 Unspecified severe protein-calorie malnutrition: Secondary | ICD-10-CM | POA: Diagnosis not present

## 2022-10-12 LAB — GLUCOSE, CAPILLARY
Glucose-Capillary: 108 mg/dL — ABNORMAL HIGH (ref 70–99)
Glucose-Capillary: 110 mg/dL — ABNORMAL HIGH (ref 70–99)
Glucose-Capillary: 113 mg/dL — ABNORMAL HIGH (ref 70–99)
Glucose-Capillary: 130 mg/dL — ABNORMAL HIGH (ref 70–99)
Glucose-Capillary: 77 mg/dL (ref 70–99)
Glucose-Capillary: 95 mg/dL (ref 70–99)

## 2022-10-12 LAB — MAGNESIUM
Magnesium: 1.8 mg/dL (ref 1.7–2.4)
Magnesium: 1.9 mg/dL (ref 1.7–2.4)

## 2022-10-12 LAB — PHOSPHORUS
Phosphorus: 3.3 mg/dL (ref 2.5–4.6)
Phosphorus: 2.6 mg/dL (ref 2.5–4.6)

## 2022-10-12 NOTE — TOC Progression Note (Signed)
Transition of Care [facility]) - Progression Note    Patient Details  Name: Earl Thomas MRN: [id number] Date of Birth: [birth year]  Transition of Care [facility]) CM/SW Contact  Lawerance Sabal, RN Phone Number: 10/12/2022, 3:39 PM  Clinical Narrative:     MD confirmed patient diet would be supplemented with bolus feeds after DC.  He will require syringes, tubing and formula (preferable Osmolite 1.5 or Ensure if Osmolite not covered).  [employer] working on coverage for [employer]. Spoke w liaison, Pam, who confirmed that coverage may not be determined until Monday. Dr Dayna Barker updated. I placed a call out to [facility] to get a price check for supplies only, if patient wishes to pay out of pocket with Ensure and DC before determination on Osmolite is made. Awaiting callback from [facility] for pice of tube feeding supplies.   Expected Discharge Plan: Home w Home Health Services Barriers to Discharge: Continued Medical Work up  Expected Discharge Plan and Services In-house Referral: NA Discharge Planning Services: CM Consult Post Acute Care Choice: Home Health, Resumption of Svcs/PTA Provider Living arrangements for the past 2 months: Single Family Home                           HH Arranged: RN [facility] Agency: [facility]) Date HH Agency Contacted: 10/11/22 Time HH Agency Contacted: 1200 Representative spoke with at [facility] Agency: Morrie Sheldon   Social Determinants of Health (SDOH) Interventions SDOH Screenings   Food Insecurity: No Food Insecurity (10/10/2022)  Housing: Low Risk  (10/10/2022)  Transportation Needs: No Transportation Needs (10/10/2022)  Utilities: Not At Risk (10/10/2022)  Alcohol Screen: Low Risk  (11/19/2021)  Depression (PHQ2-9): Low Risk  (08/06/2022)  Financial Resource Strain: Low Risk  (11/19/2021)  Social Connections: Socially Integrated (11/19/2021)  Stress: No Stress Concern Present (11/19/2021)  Tobacco Use: Medium Risk (10/11/2022)    Readmission Risk Interventions      No data to display

## 2022-10-12 NOTE — Progress Notes (Signed)
PROGRESS NOTE Earl Thomas  YBO:[id number] DOB: 09-02-1950 DOA: 10/10/2022 PCP: Georgina Quint, MD  Brief Narrative/Hospital Course: 72 y.o. male with medical history significant for interstitial lung disease, chronic hypoxic respiratory failure on baseline 5 L continuous nasal cannula, hyperlipidemia, allergic rhinitis admitted with protein calorie malnutrition weight loss. He had progressive unintentional weight loss over the course of the last years/p G tube placed on  10/03/2022 but he is unsure how to administer tube feeds via the G-tube so came to ED. In the ED hemodynamically stable although soft blood pressure, on baseline 5 L nasal cannula no hypoxia, electrolytes stable. Patient admitted dietitian , social worker consulted. Seen by dietitian> placed on continuous tube feeding which she has been tolerating very well.  Patient is physically unable to meet his nutritional needs orally, has episodes of coughing up food with meals leading to significant decreased oral intake, previous MBS showed esophageal dysmotility, he will need augmentation of nutritional status in anticipation for his lung transplant. TOC consulted to help with resources/Tube Feeds and once arranged he will be discharged home.   Subjective: No new complaints Resting well Tolerating TF  Assessment and Plan: Principal Problem:   Protein-calorie malnutrition, severe Active Problems:   Interstitial lung disease   Hyperlipidemia   GERD (gastroesophageal reflux disease)   Chronic hypoxic respiratory failure   Allergic rhinitis   Severe malnutrition:bmi 14. Patient had recent PEG tube placed for nutritional augmentation, reports. Seen by dietitian> placed on continuous tube feeding which she has been tolerating very well.  Patient is physically unable to meet his nutritional needs orally, has episodes of coughing up food with meals leading to significant decreased oral intake, previous MBS showed esophageal  dysmotility, he will need augmentation of nutritional status in anticipation for his lung transplant.TOC on board.  Mild esophageal dysmotility with poor oral intake and malnutrition plan is to continue PEG tube feeding and  PO Chronic hypoxic respiratory failure/interstitial lung disease on 5 L nasal cannula, stable, continue the same Hyperlipidemia on Lipitor Allergic rhinitis on Zyrtec GERD on Pepcid   DVT prophylaxis: SCDs Start: 10/10/22 1936 Code Status:   Code Status: Full Code Family Communication: plan of care discussed with patient at bedside. Patient status is: Inpatient because of severe malnutrition Level of care: Med-Surg   Dispo: The patient is from: home            Anticipated disposition: home once tolerating TF and  home tube feed arranged Objective: Vitals last 24 hrs: Vitals:   10/11/22 1700 10/11/22 2054 10/12/22 0430 10/12/22 0726  BP: 101/61 (!) 92/58 (!) 102/59 (!) 99/58  Pulse: 99 76 88 71  Resp: 16 17 18 17   Temp: 97.9 F (36.6 C) 98.1 F (36.7 C) 98.5 F (36.9 C) 97.7 F (36.5 C)  TempSrc: Oral Oral Oral Oral  SpO2: 100% 100% 100% 100%  Weight:      Height:       Weight change:   Physical Examination: General exam: AAox3, weak,older appearing HEENT:Oral mucosa moist, Ear/Nose WNL grossly, dentition normal. Respiratory system: bilaterally diminished BS, no use of accessory muscle Cardiovascular system: S1 & S2 +, regular rate,. Gastrointestinal system: Abdomen soft, PEG+ NT,ND,BS+ Nervous System:Alert, awake, moving extremities and grossly nonfocal Extremities: LE ankle edema NEG, lower extremities warm Skin: No rashes,no icterus. MSK: Normal muscle bulk,tone, power   Medications reviewed:  Scheduled Meds:  aspirin EC  81 mg Oral q morning   atorvastatin  10 mg Oral Daily   dronabinol  2.5 mg Oral BID AC   famotidine  20 mg Oral BID   feeding supplement  237 mL Oral BID BM   feeding supplement (OSMOLITE 1.5 CAL)  1,000 mL Per Tube Q24H    feeding supplement (PROSource TF20)  60 mL Per Tube Daily   free water  125 mL Per Tube Q4H   loratadine  10 mg Oral Daily   thiamine  100 mg Oral Daily  Continuous Infusions:  Diet Order             Diet regular Room service appropriate? Yes; Fluid consistency: Thin  Diet effective now                  Intake/Output Summary (Last 24 hours) at 10/12/2022 1514 Last data filed at 10/12/2022 0416 Gross per 24 hour  Intake 512 ml  Output 355 ml  Net 157 ml    Net IO Since Admission: 196.03 mL [10/12/22 1514]  Wt Readings from Last 3 Encounters:  10/10/22 38.6 kg  10/07/22 39 kg  10/03/22 39.5 kg     Unresulted Labs (From admission, onward)     Start     Ordered   10/12/22 0500  Magnesium  (ICU Tube Feeding: PEPuP )  5A & 5P,   R (with TIMED occurrences)      10/11/22 1320   10/12/22 0500  Phosphorus  (ICU Tube Feeding: PEPuP )  5A & 5P,   R (with TIMED occurrences)      10/11/22 1320          Data Reviewed: I have personally reviewed following labs and imaging studies CBC: Recent Labs  Lab 10/10/22 1125 10/11/22 0352  WBC 6.3 4.8  NEUTROABS 4.7 2.9  HGB 13.0 11.8*  HCT 43.2 36.9*  MCV 91.5 88.5  PLT 170 167    Basic Metabolic Panel: Recent Labs  Lab 10/10/22 1125 10/11/22 0352 10/12/22 0718  NA 143 141  --   K 4.3 4.0  --   CL 104 99  --   CO2 28 33*  --   GLUCOSE 119* 85  --   BUN 11 11  --   CREATININE 0.95 1.01  --   CALCIUM 9.3 9.0  --   MG 2.0 1.9 1.8  PHOS 2.9 3.1 3.3    GFR: Estimated Creatinine Clearance: 37.7 mL/min (by C-G formula based on SCr of 1.01 mg/dL). Liver Function Tests: Recent Labs  Lab 10/11/22 0352  AST 32  ALT 37  ALKPHOS 42  BILITOT 0.4  PROT 5.8*  ALBUMIN 2.9*    Recent Labs    10/11/22 0352  TSH 4.489   Sepsis Labs: No results for input(s): "PROCALCITON", "LATICACIDVEN" in the last 168 hours.  No results found for this or any previous visit (from the past 240 hour(s)).   Antimicrobials: Anti-infectives (From admission, onward)    None      Culture/Microbiology    Component Value Date/Time   SDES  01/18/2022 2034    IN/OUT CATH URINE Performed at [facility] [place], [address] [place], [place] [address]    [facility]  01/18/2022 2034    NONE Performed at [facility] [place], [address] [place], [place] [address]    CULT  01/18/2022 2034    NO GROWTH Performed at [facility], [address] [place], [place] [address]    REPTSTATUS 01/20/2022 FINAL 01/18/2022 2034  Radiology Studies: No results found.   LOS: 1  day   Lanae Boast, MD [facility]  10/12/2022, 3:14 PM

## 2022-10-13 DIAGNOSIS — E43 Unspecified severe protein-calorie malnutrition: Secondary | ICD-10-CM | POA: Diagnosis not present

## 2022-10-13 LAB — BASIC METABOLIC PANEL
Anion gap: 7 (ref 5–15)
BUN: 12 mg/dL (ref 8–23)
CO2: 30 mmol/L (ref 22–32)
Calcium: 8.5 mg/dL — ABNORMAL LOW (ref 8.9–10.3)
Chloride: 98 mmol/L (ref 98–111)
Creatinine, Ser: 0.93 mg/dL (ref 0.61–1.24)
GFR, Estimated: >60 mL/min (ref >60)
Glucose, Bld: 82 mg/dL (ref 70–99)
Potassium: 4.6 mmol/L (ref 3.5–5.1)
Sodium: 135 mmol/L (ref 135–145)

## 2022-10-13 LAB — GLUCOSE, CAPILLARY
Glucose-Capillary: 104 mg/dL — ABNORMAL HIGH (ref 70–99)
Glucose-Capillary: 106 mg/dL — ABNORMAL HIGH (ref 70–99)
Glucose-Capillary: 107 mg/dL — ABNORMAL HIGH (ref 70–99)
Glucose-Capillary: 89 mg/dL (ref 70–99)

## 2022-10-13 LAB — PHOSPHORUS
Phosphorus: 2.8 mg/dL (ref 2.5–4.6)
Phosphorus: 2.5 mg/dL (ref 2.5–4.6)

## 2022-10-13 LAB — MAGNESIUM
Magnesium: 1.7 mg/dL (ref 1.7–2.4)
Magnesium: 2 mg/dL (ref 1.7–2.4)

## 2022-10-13 MED ORDER — OSMOLITE 1.5 CAL PO LIQD
1000.0000 mL | ORAL | Status: DC
  Administered 2022-10-13: 1000 mL
  Filled 2022-10-13 (×2): qty 1000

## 2022-10-13 MED ORDER — BENZONATATE 100 MG PO CAPS
100.0000 mg | ORAL_CAPSULE | Freq: Three times a day (TID) | ORAL | Status: DC | PRN
  Administered 2022-10-13 (×2): 100 mg via ORAL
  Filled 2022-10-13 (×2): qty 1

## 2022-10-13 NOTE — Plan of Care (Signed)
  Problem: Clinical Measurements: Goal: Ability to maintain clinical measurements within normal limits will improve Outcome: Progressing Goal: Will remain free from infection Outcome: Progressing Goal: Diagnostic test results will improve Outcome: Progressing Goal: Respiratory complications will improve Outcome: Progressing Goal: Cardiovascular complication will be avoided Outcome: Progressing   Problem: Activity: Goal: Risk for activity intolerance will decrease Outcome: Progressing   Problem: Nutrition: Goal: Adequate nutrition will be maintained Outcome: Progressing   Problem: Coping: Goal: Level of anxiety will decrease Outcome: Progressing   Problem: Elimination: Goal: Will not experience complications related to bowel motility Outcome: Progressing   Problem: Pain Managment: Goal: General experience of comfort will improve Outcome: Progressing   Problem: Safety: Goal: Ability to remain free from injury will improve Outcome: Progressing   Problem: Skin Integrity: Goal: Risk for impaired skin integrity will decrease Outcome: Progressing   

## 2022-10-13 NOTE — Progress Notes (Signed)
PROGRESS NOTE Earl HoopsMichael Edward Thomas  NWG:[id number] DOB: 10/06/1950 DOA: 10/10/2022 PCP: Georgina QuintSagardia, [name], MD  Brief Narrative/Hospital Course: 72 y.o. male with medical history significant for interstitial lung disease, chronic hypoxic respiratory failure on baseline 5 L continuous nasal cannula, hyperlipidemia, allergic rhinitis admitted with protein calorie malnutrition weight loss. He had progressive unintentional weight loss over the course of the last years/p G tube placed on  10/03/2022 but he is unsure how to administer tube feeds via the G-tube so came to ED. In the ED hemodynamically stable although soft blood pressure, on baseline 5 L nasal cannula no hypoxia, electrolytes stable. Patient admitted dietitian , social worker consulted. Seen by dietitian> placed on continuous tube feeding which she has been tolerating very well.  Patient is physically unable to meet his nutritional needs orally, has episodes of coughing up food with meals leading to significant decreased oral intake, previous MBS showed esophageal dysmotility, he will need augmentation of nutritional status in anticipation for his lung transplant. TOC consulted to help with resources/Tube Feeds and once arranged he will be discharged home.   Subjective: Patient seen and examined this morning complains of vomiting x1, stomach feels full  No pain no other new complaints Remains on home oxygen Assessment and Plan: Principal Problem:   Protein-calorie malnutrition, severe Active Problems:   Interstitial lung disease   Hyperlipidemia   GERD (gastroesophageal reflux disease)   Chronic hypoxic respiratory failure   Allergic rhinitis   Severe malnutrition w/ bmi 14. Patient had recent PEG tube placed for nutritional augmentation, due to his swallowing difficulties with esophageal dysmotility.  At this time patient needs long-term tube feeding and that patient can't be sustained nutritionally on oral intake and dependent on  tube feedings as he has been losing weight despite oral intake.  On continuous tube feeding> having episode of vomiting this morning, having bowel movement, will decrease the TF rate.Patient is physically unable to meet his nutritional needs orally, has episodes of coughing up food with meals leading to significant decreased oral intake, previous MBS showed esophageal dysmotility, he will need augmentation of nutritional status with PEG.TOC following for approval for tube feeding   Mild esophageal dysmotility with poor oral intake and malnutrition plan is to continue PEG tube feeding and  PO as tolerated  Interstitial lung disease/chronic hypoxic respiratory failure/interstitial lung disease on 5 L nasal cannula, stable, continue the same, as far out of bed to surgical center  Hyperlipidemia on Lipitor  Allergic rhinitis on Zyrtec  GERD on Pepcid  DVT prophylaxis: SCDs Start: 10/10/22 1936 Code Status:   Code Status: Full Code Family Communication: plan of care discussed with patient at bedside. Patient status is: Inpatient because of severe malnutrition Level of care: Med-Surg   Dispo: The patient is from: home            Anticipated disposition: home once tolerating TF and  home tube feed arranged Objective: Vitals last 24 hrs: Vitals:   10/12/22 1659 10/12/22 1912 10/13/22 0537 10/13/22 0731  BP: 95/63 (!) 92/58 100/61 102/61  Pulse: 89 84 87 93  Resp:  18 20 (!) 22  Temp: 98.4 F (36.9 C) 98.7 F (37.1 C) 97.8 F (36.6 C) 98 F (36.7 C)  TempSrc: Oral Oral Oral Oral  SpO2: 100% 100% 100% 100%  Weight:   38 kg   Height:       Weight change:   Physical Examination: General exam: alert awake thin frail oriented HEENT:Oral mucosa moist, Ear/Nose WNL grossly Respiratory  system: Bilaterally clear BS, no use of accessory muscle Cardiovascular system: S1 & S2 +, No JVD. Gastrointestinal system: Abdomen soft, PEG+ NT,ND, BS+ Nervous System: Alert, awake, moving extremities,he  follows commands. Extremities: LE edema neg,distal peripheral pulses palpable.  Skin: No rashes,no icterus. MSK: Normal muscle bulk,tone, power    Medications reviewed:  Scheduled Meds:  aspirin EC  81 mg Oral q morning   atorvastatin  10 mg Oral Daily   dronabinol  2.5 mg Oral BID AC   famotidine  20 mg Oral BID   feeding supplement  237 mL Oral BID BM   feeding supplement (OSMOLITE 1.5 CAL)  1,000 mL Per Tube Q24H   feeding supplement (PROSource TF20)  60 mL Per Tube Daily   free water  125 mL Per Tube Q4H   loratadine  10 mg Oral Daily   thiamine  100 mg Oral Daily  Continuous Infusions:  Diet Order             Diet regular Room service appropriate? Yes; Fluid consistency: Thin  Diet effective now                  Intake/Output Summary (Last 24 hours) at 10/13/2022 1027 Last data filed at 10/13/2022 0718 Gross per 24 hour  Intake 462 ml  Output 1040 ml  Net -578 ml    Net IO Since Admission: -381.97 mL [10/13/22 1027]  Wt Readings from Last 3 Encounters:  10/13/22 38 kg  10/07/22 39 kg  10/03/22 39.5 kg     Unresulted Labs (From admission, onward)     Start     Ordered   10/12/22 0500  Magnesium  (ICU Tube Feeding: PEPuP )  5A & 5P,   R (with TIMED occurrences)      10/11/22 1320   10/12/22 0500  Phosphorus  (ICU Tube Feeding: PEPuP )  5A & 5P,   R (with TIMED occurrences)      10/11/22 1320          Data Reviewed: I have personally reviewed following labs and imaging studies CBC: Recent Labs  Lab 10/10/22 1125 10/11/22 0352  WBC 6.3 4.8  NEUTROABS 4.7 2.9  HGB 13.0 11.8*  HCT 43.2 36.9*  MCV 91.5 88.5  PLT 170 167    Basic Metabolic Panel: Recent Labs  Lab 10/10/22 1125 10/11/22 0352 10/12/22 0718 10/12/22 1733 10/13/22 0656 10/13/22 0658  NA 143 141  --   --  135  --   K 4.3 4.0  --   --  4.6  --   CL 104 99  --   --  98  --   CO2 28 33*  --   --  30  --   GLUCOSE 119* 85  --   --  82  --   BUN 11 11  --   --  12  --   CREATININE  0.95 1.01  --   --  0.93  --   CALCIUM 9.3 9.0  --   --  8.5*  --   MG 2.0 1.9 1.8 1.9  --  1.7  PHOS 2.9 3.1 3.3 2.6  --  2.8    GFR: Estimated Creatinine Clearance: 40.3 mL/min (by C-G formula based on SCr of 0.93 mg/dL). Liver Function Tests: Recent Labs  Lab 10/11/22 0352  AST 32  ALT 37  ALKPHOS 42  BILITOT 0.4  PROT 5.8*  ALBUMIN 2.9*    Recent Labs    10/11/22  0352  TSH 4.489   Sepsis Labs: No results for input(s): "PROCALCITON", "LATICACIDVEN" in the last 168 hours.  No results found for this or any previous visit (from the past 240 hour(s)).  Antimicrobials: Anti-infectives (From admission, onward)    None      Culture/Microbiology    Component Value Date/Time   SDES  01/18/2022 2034    IN/OUT CATH URINE Performed at [facility], [address] [place], [place] [address]    [facility]  01/18/2022 2034    NONE Performed at [facility], [address] [place], [place] [address]    CULT  01/18/2022 2034    NO GROWTH Performed at [facility], [address], [place], [place] [address]    REPTSTATUS 01/20/2022 FINAL 01/18/2022 2034  Radiology Studies: No results found.   LOS: 2 days   Lanae Boast, MD [facility]  10/13/2022, 10:27 AM

## 2022-10-14 ENCOUNTER — Ambulatory Visit: Payer: Medicare Other | Admitting: Internal Medicine

## 2022-10-14 DIAGNOSIS — J9610 Chronic respiratory failure, unspecified whether with hypoxia or hypercapnia: Secondary | ICD-10-CM | POA: Diagnosis not present

## 2022-10-14 DIAGNOSIS — J42 Unspecified chronic bronchitis: Secondary | ICD-10-CM | POA: Diagnosis not present

## 2022-10-14 DIAGNOSIS — D352 Benign neoplasm of pituitary gland: Secondary | ICD-10-CM

## 2022-10-14 DIAGNOSIS — Z791 Long term (current) use of non-steroidal anti-inflammatories (NSAID): Secondary | ICD-10-CM

## 2022-10-14 DIAGNOSIS — K219 Gastro-esophageal reflux disease without esophagitis: Secondary | ICD-10-CM | POA: Diagnosis not present

## 2022-10-14 DIAGNOSIS — J849 Interstitial pulmonary disease, unspecified: Secondary | ICD-10-CM | POA: Diagnosis not present

## 2022-10-14 DIAGNOSIS — Z9981 Dependence on supplemental oxygen: Secondary | ICD-10-CM

## 2022-10-14 DIAGNOSIS — I5032 Chronic diastolic (congestive) heart failure: Secondary | ICD-10-CM | POA: Diagnosis not present

## 2022-10-14 DIAGNOSIS — I700 Atherosclerosis of aorta: Secondary | ICD-10-CM | POA: Diagnosis not present

## 2022-10-14 DIAGNOSIS — Z7952 Long term (current) use of systemic steroids: Secondary | ICD-10-CM

## 2022-10-14 DIAGNOSIS — Z556 Problems related to health literacy: Secondary | ICD-10-CM

## 2022-10-14 DIAGNOSIS — E291 Testicular hypofunction: Secondary | ICD-10-CM

## 2022-10-14 DIAGNOSIS — Z7951 Long term (current) use of inhaled steroids: Secondary | ICD-10-CM

## 2022-10-14 DIAGNOSIS — Z87891 Personal history of nicotine dependence: Secondary | ICD-10-CM

## 2022-10-14 DIAGNOSIS — E43 Unspecified severe protein-calorie malnutrition: Secondary | ICD-10-CM | POA: Diagnosis not present

## 2022-10-14 DIAGNOSIS — M0510 Rheumatoid lung disease with rheumatoid arthritis of unspecified site: Secondary | ICD-10-CM | POA: Diagnosis not present

## 2022-10-14 DIAGNOSIS — M1990 Unspecified osteoarthritis, unspecified site: Secondary | ICD-10-CM | POA: Diagnosis not present

## 2022-10-14 DIAGNOSIS — E785 Hyperlipidemia, unspecified: Secondary | ICD-10-CM | POA: Diagnosis not present

## 2022-10-14 DIAGNOSIS — Z431 Encounter for attention to gastrostomy: Secondary | ICD-10-CM | POA: Diagnosis not present

## 2022-10-14 DIAGNOSIS — G9332 Myalgic encephalomyelitis/chronic fatigue syndrome: Secondary | ICD-10-CM | POA: Diagnosis not present

## 2022-10-14 LAB — GLUCOSE, CAPILLARY
Glucose-Capillary: 129 mg/dL — ABNORMAL HIGH (ref 70–99)
Glucose-Capillary: 101 mg/dL — ABNORMAL HIGH (ref 70–99)
Glucose-Capillary: 101 mg/dL — ABNORMAL HIGH (ref 70–99)
Glucose-Capillary: 64 mg/dL — ABNORMAL LOW (ref 70–99)
Glucose-Capillary: 107 mg/dL — ABNORMAL HIGH (ref 70–99)

## 2022-10-14 MED ORDER — OSMOLITE 1.5 CAL PO LIQD
237.0000 mL | Freq: Four times a day (QID) | ORAL | Status: DC
  Administered 2022-10-14: 120 mL
  Administered 2022-10-14: 237 mL
  Filled 2022-10-14 (×3): qty 237

## 2022-10-14 NOTE — Progress Notes (Addendum)
Nutrition Follow-up  DOCUMENTATION CODES:   Severe malnutrition in context of chronic illness, Underweight  INTERVENTION:   Transition TF to bolus regimen via PEG tube: 1 carton ( ) Osmolite 1.5 QIS 73ml water flushes before and after each bolus Provides 1420 kcal, 60g protein, free water  Home tube feeding regimen if unable to obtain Osmolite 1.5: 4 cartons Ensure Plus High Protein QID 53ml free water flushes before and after each bolus Provides 1400 kcal and 80g protein   Continue Ensure Enlive po BID, each supplement provides 350 kcal and 20 grams of protein.  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 - remains applicable  GOAL:   Patient will meet greater than or equal to 90% of their needs - goal met via TF and meals/ONS  MONITOR:   PO intake, Labs, Weight trends  REASON FOR ASSESSMENT:   Consult Assessment of nutrition requirement/status, Enteral/tube feeding initiation and management (Assistance with initiation of tube feeds (recently placed G-tube), along with patient education (patient needs education in the operation and maintenance of the G tube). Thank you very much.)  ASSESSMENT:   Pt admitted with weight loss and severe protein calorie malnutrition. S/p PEG tube placement on 3/28. PMH significant for interstitial lung disease, chronic hypoxic respiratory failure, HLD, allergic rhinitis.  Pt remains with ongoing indication for long term nutrition via PEG to optimize nutritional status for transplant consideration.   Pt sitting up in bed with wife present at bedside.  TF was advanced to goal over the weekend however pt had 1 episode of emesis with c/o "fullness." Today denies n/v/abdominal discomfort. Continues with regular BM's.   He reports eating at least 75% of meals.  He had received 1 Glucerna and 1 Ensure over the weekend but did not consume them as they were vanilla flavored which she does not enjoy.   Weight history: 4/4: 38.6 kg 4/7: 38 kg  Medications: marinol, pepcid, thiamine  Labs reviewed  CBG's 89-129 x24 hours  Diet Order:   Diet Order             Diet regular Room service appropriate? Yes; Fluid consistency: Thin  Diet effective now                   EDUCATION NEEDS:   Education needs have been addressed  Skin:  Skin Assessment: Reviewed RN Assessment  Last BM:  4/8 (type 6 x2)  Height:   Ht Readings from Last 1 Encounters:  10/10/22 (S) 5\' 5"  (1.651 m)    Weight:   Wt Readings from Last 1 Encounters:  10/13/22 38 kg    Ideal Body Weight:  61.8 kg  BMI:  Body mass index is 13.94 kg/m.  Estimated Nutritional Needs:   Kcal:  1500-1700  Protein:  75-90g  Fluid:  >/=1.5L  Drusilla Kanner, RDN, LDN Clinical Nutrition

## 2022-10-14 NOTE — Progress Notes (Signed)
Patient has been discharged home with his wife. PIV discontinued, site looks clean, dry, and intact. Education provided regarding his home medication, bolus tube feeding, patient and his wife verbalize understanding. All his belongings and AVS paper has been handed  to patient .

## 2022-10-14 NOTE — Discharge Summary (Signed)
Physician Discharge Summary  Earl Thomas IRJ:[id number] DOB: 12/29/1950 DOA: 10/10/2022  PCP: Georgina Quint, MD  Admit date: 10/10/2022 Discharge date: 10/14/2022 Recommendations for Outpatient Follow-up:  Follow up with PCP in 1 weeks-call for appointment Please obtain BMP/CBC in one week  Discharge Dispo: Home Discharge Condition: Stable Code Status:   Code Status: Full Code Diet recommendation:  Diet Order             Diet regular Room service appropriate? Yes; Fluid consistency: Thin  Diet effective now                    Brief/Interim Summary: 72 y.o. male with medical history significant for interstitial lung disease, chronic hypoxic respiratory failure on baseline 5 L continuous nasal cannula, hyperlipidemia, allergic rhinitis admitted with protein calorie malnutrition weight loss. He had progressive unintentional weight loss over the course of the last years/p G tube placed on  10/03/2022 but he is unsure how to administer tube feeds via the G-tube so came to ED. In the ED hemodynamically stable although soft blood pressure, on baseline 5 L nasal cannula no hypoxia, electrolytes stable. Patient admitted dietitian , social worker consulted. Seen by dietitian> placed on continuous tube feeding which she has been tolerating very well.  Patient is physically unable to meet his nutritional needs orally, has episodes of coughing up food with meals leading to significant decreased oral intake, previous MBS showed esophageal dysmotility, he will need augmentation of nutritional status in anticipation for his lung transplant. TOC consulted to help with resources/Tube Feeds and once arranged he will be discharged home. Tube feeding has been approvedteaching will be completed and patient will discharge later today/set  Discharge Diagnoses:  Principal Problem:   Protein-calorie malnutrition, severe Active Problems:   Interstitial lung disease   Hyperlipidemia   GERD  (gastroesophageal reflux disease)   Chronic hypoxic respiratory failure   Allergic rhinitis  Severe malnutrition w/ bmi 14. Patient had recent PEG tube placed for nutritional augmentation, due to his swallowing difficulties with esophageal dysmotility at this time patient needs long-term tube feeding and that patient can't be sustained nutritionally on oral intake and dependent on tube feedings, he has been losing weight despite taking orally.  On continuous tube feeding>changed to bolus> dietitian following. previous MBS showed esophageal dysmotilit.  Awaiting on approval for home tube feeding   Mild esophageal dysmotility with poor oral intake and malnutrition plan is to continue PEG tube feeding  Interstitial lung disease/chronic hypoxic respiratory failure/interstitial lung disease on 3 L [place]- stable cont same, he will continue to follow-up with his [facility] pulmonary services.  Hyperlipidemia on Lipitor   Allergic rhinitis on Zyrtec   GERD on Pepcid  Consults: dietitiacn Subjective: Aaox3, awaiiting for approval for tube feed for home  Discharge Exam: Vitals:   10/14/22 0830 10/14/22 1540  BP: 100/65 121/87  Pulse: 87 99  Resp: 18 20  Temp: 98 F (36.7 C) 98 F (36.7 C)  SpO2: 100% 94%   General: Pt is alert, awake, not in acute distress Cardiovascular: RRR, S1/S2 +, no rubs, no gallops Respiratory: CTA bilaterally, no wheezing, no rhonchi Abdominal: Soft, NT, ND, bowel sounds + Extremities: no edema, no cyanosis  Discharge Instructions  Discharge Instructions     Discharge instructions   Complete by: As directed    Please call call MD or return to ER for similar or worsening recurring problem that brought you to hospital or if any fever,nausea/vomiting,abdominal pain, uncontrolled pain, chest pain,  shortness of breath or any other alarming symptoms.  Please follow-up your doctor as instructed in a week time and call the office for appointment.  Please avoid alcohol,  smoking, or any other illicit substance and maintain healthy habits including taking your regular medications as prescribed.  You were cared for by a hospitalist during your hospital stay. If you have any questions about your discharge medications or the care you received while you were in the hospital after you are discharged, you can call the unit and ask to speak with the hospitalist on call if the hospitalist that took care of you is not available.  Once you are discharged, your primary care physician will handle any further medical issues. Please note that NO REFILLS for any discharge medications will be authorized once you are discharged, as it is imperative that you return to your primary care physician (or establish a relationship with a primary care physician if you do not have one) for your aftercare needs so that they can reassess your need for medications and monitor your lab values   Increase activity slowly   Complete by: As directed       Allergies as of 10/14/2022   No Known Allergies      Medication List     STOP taking these medications    Breo Ellipta 50-25 MCG/ACT Aepb Generic drug: Fluticasone Furoate-Vilanterol   diclofenac sodium 1 % Gel Commonly known as: Voltaren   dicyclomine 20 MG tablet Commonly known as: BENTYL   methotrexate 10 MG tablet Commonly known as: RHEUMATREX   mirtazapine 15 MG tablet Commonly known as: REMERON   predniSONE 10 MG tablet Commonly known as: DELTASONE   sulfamethoxazole-trimethoprim 800-160 MG tablet Commonly known as: BACTRIM DS       TAKE these medications    acetaminophen 500 MG tablet Commonly known as: TYLENOL Take 1,000 mg by mouth every 6 (six) hours as needed for headache (pain).   albuterol 108 (90 Base) MCG/ACT inhaler Commonly known as: VENTOLIN HFA Inhale 1-2 puffs into the lungs every 6 (six) hours as needed for wheezing or shortness of breath.   ascorbic acid 500 MG tablet Commonly known as:  VITAMIN C Take 500 mg by mouth daily.   aspirin EC 81 MG tablet Take 81 mg by mouth every morning.   atorvastatin 10 MG tablet Commonly known as: LIPITOR Take 1 tablet (10 mg total) by mouth daily.   azelastine 0.1 % nasal spray Commonly known as: ASTELIN Place 2 sprays into both nostrils 2 (two) times daily. Use in each nostril as directed What changed:  when to take this reasons to take this   cetirizine 10 MG tablet Commonly known as: ZYRTEC Take 10 mg by mouth daily.   CLEAR EYES OP Place 1 drop into both eyes daily.   dronabinol 2.5 MG capsule Commonly known as: MARINOL Take 1 capsule (2.5 mg total) by mouth 2 (two) times daily before a meal.   famotidine 20 MG tablet Commonly known as: PEPCID Take 1 tablet (20 mg total) by mouth 2 (two) times daily.   folic acid 1 MG tablet Commonly known as: [name] Take 1 mg by mouth every morning.   guaifenesin 100 MG/5ML syrup Commonly known as: ROBITUSSIN Take 200 mg by mouth 3 (three) times daily as needed for cough or congestion.   HYDROcodone-acetaminophen 5-325 MG tablet Commonly known as: Norco Take 1 tablet by mouth every 6 (six) hours as needed for moderate pain.   hydrocortisone 2.5 %  rectal cream Commonly known as: Proctozone-HC Place 1 Application rectally 2 (two) times daily.   levocetirizine 5 MG tablet Commonly known as: XYZAL TAKE 1 TABLET BY MOUTH ONCE DAILY IN THE EVENING   loperamide 2 MG capsule Commonly known as: IMODIUM Take 1 capsule (2 mg total) by mouth 4 (four) times daily as needed for diarrhea or loose stools.   Lumify 0.025 % Soln Generic drug: Brimonidine Tartrate Place 1 drop into both eyes every Sunday.   meloxicam 7.5 MG tablet Commonly known as: MOBIC Take 1 tablet (7.5 mg total) by mouth daily as needed for pain.   nitroGLYCERIN 0.4 MG SL tablet Commonly known as: NITROSTAT Place 1 tablet (0.4 mg total) under the tongue every 5 (five) minutes as needed. What changed:  reasons to take this   ondansetron 4 MG disintegrating tablet Commonly known as: ZOFRAN-ODT Take 1 tablet (4 mg total) by mouth every 8 (eight) hours as needed.   RITUXAN IV Inject into the vein.   Xyosted 75 MG/0.5ML Soaj Generic drug: Testosterone Enanthate Inject 75 mg into the skin once a week.               Durable Medical Equipment  (From admission, onward)           Start     Ordered   10/14/22 1412  For home use only DME Tube feeding  Once       Comments: 1 carton ( ) Osmolite 1.5 QID 30ml water flushes before and after each bolus Provides 1420 kcal, 60g protein, free water   10/14/22 1411            Follow-up Information     [facility] Follow up.   Why: Home health has been arranged. They will contact you to schedule apt.               No Known Allergies  The results of significant diagnostics from this hospitalization (including imaging, microbiology, ancillary and laboratory) are listed below for reference.    Microbiology: No results found for this or any previous visit (from the past 240 hour(s)).  Procedures/Studies: IR GASTROSTOMY TUBE MOD SED  Result Date: 10/03/2022 INDICATION: 72 year old with severe protein calorie malnutrition. Patient needs a gastrostomy tube for nutrition supplementation. EXAM: PERCUTANEOUS GASTROSTOMY TUBE WITH FLUOROSCOPIC GUIDANCE Physician: Rachelle Hora. Henn, MD MEDICATIONS: Ancef 2 g, 1 mg glucagon ANESTHESIA/SEDATION: Moderate (conscious) sedation was employed during this procedure. A total of Versed  and fentanyl 50 mcg was administered intravenously at the order of the provider performing the procedure. Total intra-service moderate sedation time: 15 minutes. Patient's level of consciousness and vital signs were monitored continuously by radiology nurse throughout the procedure under the supervision of the provider performing the procedure. FLUOROSCOPY: Radiation Exposure Index (as provided  by the fluoroscopic device): 4 mGy Kerma COMPLICATIONS: None immediate. PROCEDURE: The procedure was explained to the patient. The risks and benefits of the procedure were discussed and the patient's questions were addressed. Informed consent was obtained from the patient. The patient was placed on the interventional table. An orogastric tube was placed with fluoroscopic guidance. The anterior abdomen was prepped and draped in sterile fashion. Maximal barrier sterile technique was utilized including caps, mask, sterile gowns, sterile gloves, sterile drape, hand hygiene and skin antiseptic. Stomach was inflated with air through the orogastric tube. The skin and subcutaneous tissues were anesthetized with 1% lidocaine. A 17 gauge needle was directed into the distended stomach with fluoroscopic guidance. A wire was  advanced into the stomach and a T-tact was deployed. A 9-French vascular sheath was placed and the orogastric tube was snared using a [employer] snare device. The orogastric tube and snare were pulled out of the patient's mouth. The snare device was connected to a 20-French gastrostomy tube. The snare device and gastrostomy tube were pulled through the patient's mouth and out the anterior abdominal wall. The gastrostomy tube was cut to an appropriate length. Contrast injection through gastrostomy tube confirmed placement within the stomach. Fluoroscopic images were obtained for documentation. The gastrostomy tube was flushed with normal saline. IMPRESSION: Successful fluoroscopic guided percutaneous gastrostomy tube placement. Electronically Signed   By: Richarda Overlie M.D.   On: 10/03/2022 17:38    Labs: BNP (last 3 results) Recent Labs    01/20/22 0445 01/21/22 0515 06/07/22 1613  BNP 83.7 151.3* 24.8   Basic Metabolic Panel: Recent Labs  Lab 10/10/22 1125 10/11/22 0352 10/12/22 0718 10/12/22 1733 10/13/22 0656 10/13/22 0658 10/13/22 1934  NA 143 141  --   --  135  --   --   K 4.3 4.0  --    --  4.6  --   --   CL 104 99  --   --  98  --   --   CO2 28 33*  --   --  30  --   --   GLUCOSE 119* 85  --   --  82  --   --   BUN 11 11  --   --  12  --   --   CREATININE 0.95 1.01  --   --  0.93  --   --   CALCIUM 9.3 9.0  --   --  8.5*  --   --   MG 2.0 1.9 1.8 1.9  --  1.7 2.0  PHOS 2.9 3.1 3.3 2.6  --  2.8 2.5   Liver Function Tests: Recent Labs  Lab 10/11/22 0352  AST 32  ALT 37  ALKPHOS 42  BILITOT 0.4  PROT 5.8*  ALBUMIN 2.9*   No results for input(s): "LIPASE", "AMYLASE" in the last 168 hours. No results for input(s): "AMMONIA" in the last 168 hours. CBC: Recent Labs  Lab 10/10/22 1125 10/11/22 0352  WBC 6.3 4.8  NEUTROABS 4.7 2.9  HGB 13.0 11.8*  HCT 43.2 36.9*  MCV 91.5 88.5  PLT 170 167   Cardiac Enzymes: No results for input(s): "CKTOTAL", "CKMB", "CKMBINDEX", "TROPONINI" in the last 168 hours. BNP: Invalid input(s): "POCBNP" CBG: Recent Labs  Lab 10/13/22 2334 10/14/22 0614 10/14/22 0751 10/14/22 1117 10/14/22 1555  GLUCAP 104* 129* 101* 101* 64*   Anemia work up No results for input(s): "VITAMINB12", "FOLATE", "FERRITIN", "TIBC", "IRON", "RETICCTPCT" in the last 72 hours. Urinalysis    Component Value Date/Time   COLORURINE YELLOW 01/18/2022 2033   APPEARANCEUR CLEAR 01/18/2022 2033   LABSPEC 1.010 01/18/2022 2033   PHURINE 6.5 01/18/2022 2033   GLUCOSEU NEGATIVE 01/18/2022 2033   HGBUR NEGATIVE 01/18/2022 2033   BILIRUBINUR NEGATIVE 01/18/2022 2033   BILIRUBINUR negative 05/14/2017 1557   BILIRUBINUR Negative 02/22/2015 1434   KETONESUR NEGATIVE 01/18/2022 2033   PROTEINUR NEGATIVE 01/18/2022 2033   UROBILINOGEN 0.2 05/14/2017 1557   NITRITE NEGATIVE 01/18/2022 2033   LEUKOCYTESUR NEGATIVE 01/18/2022 2033   Sepsis Labs Recent Labs  Lab 10/10/22 1125 10/11/22 0352  WBC 6.3 4.8   Microbiology No results found for this or any previous visit (from the past 240 hour(s)).  Time coordinating discharge: 25  minutes  SIGNED: Lanae Boastamesh Buffey Zabinski, MD  [facility] 10/14/2022, 4:21 PM  If 7PM-7AM, please contact night-coverage [email or web address]

## 2022-10-14 NOTE — TOC Progression Note (Signed)
Transition of Care [facility]) - Progression Note    Patient Details  Name: Earl Thomas MRN: [id number] Date of Birth: 1950/10/23  Transition of Care [facility]) CM/SW Contact  Janae Bridgeman, RN Phone Number: 10/14/2022, 3:55 PM  Clinical Narrative:    CM spoke with Jeri Modena, RNCM with [employer] and she received the tube feeding orders at 3 pm today and will be able to teach the family after 6 pm tonight.  Bedside nursing and attending MD were made aware that patient will remain at the hospital until the bedside teaching is completed by Resa Miner, [facility] states that patient plans to discharge home by car with family tonight and family to provide oxygen tank for home.   Expected Discharge Plan: Home w Home Health Services Barriers to Discharge: Barriers Resolved  Expected Discharge Plan and Services In-house Referral: NA Discharge Planning Services: CM Consult Post Acute Care Choice: Home Health, Resumption of Svcs/PTA Provider Living arrangements for the past 2 months: Single Family Home Expected Discharge Date: 10/14/22               DME Arranged: Tamsen Snider feeding DME Agency: Other - Comment [name]) Date DME Agency Contacted: 10/14/22 Time DME Agency Contacted: 1438 Representative spoke with at DME Agency: Pam HH Arranged: RN HH Agency: [facility] Date HH Agency Contacted: 10/14/22 Time HH Agency Contacted: 1437 Representative spoke with at [facility] Agency: Morrie Sheldon   Social Determinants of Health (SDOH) Interventions SDOH Screenings   Food Insecurity: No Food Insecurity (10/10/2022)  Housing: Low Risk  (10/10/2022)  Transportation Needs: No Transportation Needs (10/10/2022)  Utilities: Not At Risk (10/10/2022)  Alcohol Screen: Low Risk  (11/19/2021)  Depression (PHQ2-9): Low Risk  (08/06/2022)  Financial Resource Strain: Low Risk  (11/19/2021)  Social Connections: Socially Integrated (11/19/2021)  Stress: No Stress Concern Present (11/19/2021)  Tobacco  Use: Medium Risk (10/11/2022)    Readmission Risk Interventions    10/14/2022    2:38 PM  Readmission Risk Prevention Plan  Transportation Screening Complete  PCP or Specialist Appt within 5-7 Days Complete  Home Care Screening Complete  Medication Review (RN CM) Complete

## 2022-10-14 NOTE — TOC Transition Note (Signed)
Transition of Care [facility]) - CM/SW Discharge Note   Patient Details  Name: Earl Thomas MRN: [id number] Date of Birth: [birth year]  Transition of Care [facility]) CM/SW Contact:  Harriet Masson, RN Phone Number: 10/14/2022, 2:41 PM   Clinical Narrative:    Patient stable for discharge.  Pam with [name] will teach patient and wife how to do tube feeds. Resumption orders for home health RN placed. Ashely with [facility] aware of discharge.  Wife will transport home.     Final next level of care: Home w Home Health Services Barriers to Discharge: Barriers Resolved   Patient Goals and CMS Choice    Return home  Discharge Placement                 home        Discharge Plan and Services Additional resources added to the After Visit Summary for   In-house Referral: NA Discharge Planning Services: CM Consult Post Acute Care Choice: Home Health, Resumption of Svcs/PTA Provider          DME Arranged: Tube feeding DME Agency: Other - Comment [name]) Date DME Agency Contacted: 10/14/22 Time DME Agency Contacted: 1438 Representative spoke with at DME Agency: Pam HH Arranged: RN HH Agency: [facility] Date HH Agency Contacted: 10/14/22 Time HH Agency Contacted: 1437 Representative spoke with at [facility] Agency: Morrie Sheldon  Social Determinants of Health (SDOH) Interventions SDOH Screenings   Food Insecurity: No Food Insecurity (10/10/2022)  Housing: Low Risk  (10/10/2022)  Transportation Needs: No Transportation Needs (10/10/2022)  Utilities: Not At Risk (10/10/2022)  Alcohol Screen: Low Risk  (11/19/2021)  Depression (PHQ2-9): Low Risk  (08/06/2022)  Financial Resource Strain: Low Risk  (11/19/2021)  Social Connections: Socially Integrated (11/19/2021)  Stress: No Stress Concern Present (11/19/2021)  Tobacco Use: Medium Risk (10/11/2022)     Readmission Risk Interventions    10/14/2022    2:38 PM  Readmission Risk Prevention Plan  Transportation Screening  Complete  PCP or Specialist Appt within 5-7 Days Complete  Home Care Screening Complete  Medication Review (RN CM) Complete

## 2022-10-14 NOTE — Progress Notes (Signed)
PROGRESS NOTE Earl HoopsMichael Edward Thomas  WUJ:[id number] DOB: 06/07/1951 DOA: 10/10/2022 PCP: Earl QuintSagardia, [name], MD  Brief Narrative/Hospital Course: 72 y.o. male with medical history significant for interstitial lung disease, chronic hypoxic respiratory failure on baseline 5 L continuous nasal cannula, hyperlipidemia, allergic rhinitis admitted with protein calorie malnutrition weight loss. He had progressive unintentional weight loss over the course of the last years/p G tube placed on  10/03/2022 but he is unsure how to administer tube feeds via the G-tube so came to ED. In the ED hemodynamically stable although soft blood pressure, on baseline 5 L nasal cannula no hypoxia, electrolytes stable. Patient admitted dietitian , social worker consulted. Seen by dietitian> placed on continuous tube feeding which she has been tolerating very well.  Patient is physically unable to meet his nutritional needs orally, has episodes of coughing up food with meals leading to significant decreased oral intake, previous MBS showed esophageal dysmotility, he will need augmentation of nutritional status in anticipation for his lung transplant. TOC consulted to help with resources/Tube Feeds and once arranged he will be discharged home.   Subjective: Patient seen and examined this morning having regular bowel movement tolerating tube feed.   Patient wife at the bedside endorsing that patient has been losing weight despite taking orally On 3 to nasal cannula home oxygen setting   Assessment and Plan: Principal Problem:   Protein-calorie malnutrition, severe Active Problems:   Interstitial lung disease   Hyperlipidemia   GERD (gastroesophageal reflux disease)   Chronic hypoxic respiratory failure   Allergic rhinitis   Severe malnutrition w/ bmi 14. Patient had recent PEG tube placed for nutritional augmentation, due to his swallowing difficulties with esophageal dysmotility at this time patient needs long-term  tube feeding and that patient can't be sustained nutritionally on oral intake and dependent on tube feedings, he has been losing weight despite taking orally.  On continuous tube feeding dietitian following. previous MBS showed esophageal dysmotilit.  Awaiting on approval for home tube feeding  Mild esophageal dysmotility with poor oral intake and malnutrition plan is to continue PEG tube feeding  Interstitial lung disease/chronic hypoxic respiratory failure/interstitial lung disease on 3 L [place]- stable cont same, he will continue to follow-up with his [facility] pulmonary services.  Hyperlipidemia on Lipitor  Allergic rhinitis on Zyrtec  GERD on Pepcid  DVT prophylaxis: SCDs Start: 10/10/22 1936 Code Status:   Code Status: Full Code Family Communication: plan of care discussed with patient at bedside. Patient status is: Inpatient because of severe malnutrition Level of care: Med-Surg   Dispo: The patient is from: home            Anticipated disposition: home once tolerating TF and  home tube feed arranged Objective: Vitals last 24 hrs: Vitals:   10/13/22 1618 10/13/22 1930 10/14/22 0424 10/14/22 0830  BP: 97/68 (!) 111/55 124/66 100/65  Pulse: 91 93 87 87  Resp: 18 18 18 18   Temp: 98.1 F (36.7 C) 98.5 F (36.9 C) 97.9 F (36.6 C) 98 F (36.7 C)  TempSrc: Oral Oral Oral Oral  SpO2: 100% 100% 100% 100%  Weight:      Height:       Weight change:   Physical Examination: General exam: AAox3, weak,older appearing HEENT:Oral mucosa moist, Ear/Nose WNL grossly, dentition normal. Respiratory system: bilaterally clear BS, no use of accessory muscle Cardiovascular system: S1 & S2 +, regular rate. Gastrointestinal system: Abdomen soft,  PEG+, NT,ND,BS+ Nervous System:Alert, awake, moving extremities and grossly nonfocal Extremities: LE ankle edema  neg, lower extremities warm Skin: No rashes,no icterus. MSK: Normal muscle bulk,tone, power     Medications reviewed:  Scheduled Meds:   aspirin EC  81 mg Oral q morning   atorvastatin  10 mg Oral Daily   dronabinol  2.5 mg Oral BID AC   famotidine  20 mg Oral BID   feeding supplement  237 mL Oral BID BM   feeding supplement (OSMOLITE 1.5 CAL)  237 mL Per Tube QID   feeding supplement (PROSource TF20)  60 mL Per Tube Daily   free water  125 mL Per Tube Q4H   loratadine  10 mg Oral Daily   thiamine  100 mg Oral Daily  Continuous Infusions:  Diet Order             Diet regular Room service appropriate? Yes; Fluid consistency: Thin  Diet effective now                  Intake/Output Summary (Last 24 hours) at 10/14/2022 1314 Last data filed at 10/14/2022 1302 Gross per 24 hour  Intake 290 ml  Output 1575 ml  Net -1285 ml    Net IO Since Admission: -1,666.97 mL [10/14/22 1314]  Wt Readings from Last 3 Encounters:  10/13/22 38 kg  10/07/22 39 kg  10/03/22 39.5 kg     Unresulted Labs (From admission, onward)    None     Data Reviewed: I have personally reviewed following labs and imaging studies CBC: Recent Labs  Lab 10/10/22 1125 10/11/22 0352  WBC 6.3 4.8  NEUTROABS 4.7 2.9  HGB 13.0 11.8*  HCT 43.2 36.9*  MCV 91.5 88.5  PLT 170 167    Basic Metabolic Panel: Recent Labs  Lab 10/10/22 1125 10/11/22 0352 10/12/22 0718 10/12/22 1733 10/13/22 0656 10/13/22 0658 10/13/22 1934  NA 143 141  --   --  135  --   --   K 4.3 4.0  --   --  4.6  --   --   CL 104 99  --   --  98  --   --   CO2 28 33*  --   --  30  --   --   GLUCOSE 119* 85  --   --  82  --   --   BUN 11 11  --   --  12  --   --   CREATININE 0.95 1.01  --   --  0.93  --   --   CALCIUM 9.3 9.0  --   --  8.5*  --   --   MG 2.0 1.9 1.8 1.9  --  1.7 2.0  PHOS 2.9 3.1 3.3 2.6  --  2.8 2.5    GFR: Estimated Creatinine Clearance: 40.3 mL/min (by C-G formula based on SCr of 0.93 mg/dL). Liver Function Tests: Recent Labs  Lab 10/11/22 0352  AST 32  ALT 37  ALKPHOS 42  BILITOT 0.4  PROT 5.8*  ALBUMIN 2.9*    No results for  input(s): "TSH", "T4TOTAL", "FREET4", "T3FREE", "THYROIDAB" in the last 72 hours. Sepsis Labs: No results for input(s): "PROCALCITON", "LATICACIDVEN" in the last 168 hours.  No results found for this or any previous visit (from the past 240 hour(s)).  Antimicrobials: Anti-infectives (From admission, onward)    None      Culture/Microbiology    Component Value Date/Time   SDES  01/18/2022 2034    IN/OUT CATH URINE Performed at [facility],  [address], [place], [place] [address]    [facility]  01/18/2022 2034    NONE Performed at [facility], [address], [place], [place] [address]    CULT  01/18/2022 2034    NO GROWTH Performed at [facility], [address], [place], [place] [address]    REPTSTATUS 01/20/2022 FINAL 01/18/2022 2034  Radiology Studies: No results found.   LOS: 3 days   Lanae Boast, MD [facility]  10/14/2022, 1:14 PM

## 2022-10-14 NOTE — Plan of Care (Signed)
  Problem: Clinical Measurements: Goal: Ability to maintain clinical measurements within normal limits will improve Outcome: Progressing Goal: Will remain free from infection Outcome: Progressing Goal: Diagnostic test results will improve Outcome: Progressing Goal: Respiratory complications will improve Outcome: Progressing Goal: Cardiovascular complication will be avoided Outcome: Progressing   Problem: Activity: Goal: Risk for activity intolerance will decrease Outcome: Progressing   Problem: Nutrition: Goal: Adequate nutrition will be maintained Outcome: Progressing   Problem: Coping: Goal: Level of anxiety will decrease Outcome: Progressing   Problem: Elimination: Goal: Will not experience complications related to bowel motility Outcome: Progressing   Problem: Pain Managment: Goal: General experience of comfort will improve Outcome: Progressing   Problem: Safety: Goal: Ability to remain free from injury will improve Outcome: Progressing   Problem: Skin Integrity: Goal: Risk for impaired skin integrity will decrease Outcome: Progressing   

## 2022-10-15 DIAGNOSIS — K219 Gastro-esophageal reflux disease without esophagitis: Secondary | ICD-10-CM | POA: Diagnosis not present

## 2022-10-15 DIAGNOSIS — M26629 Arthralgia of temporomandibular joint, unspecified side: Secondary | ICD-10-CM | POA: Diagnosis not present

## 2022-10-15 DIAGNOSIS — M0510 Rheumatoid lung disease with rheumatoid arthritis of unspecified site: Secondary | ICD-10-CM | POA: Diagnosis not present

## 2022-10-15 DIAGNOSIS — J309 Allergic rhinitis, unspecified: Secondary | ICD-10-CM | POA: Diagnosis not present

## 2022-10-15 DIAGNOSIS — I5032 Chronic diastolic (congestive) heart failure: Secondary | ICD-10-CM | POA: Diagnosis not present

## 2022-10-15 DIAGNOSIS — G9332 Myalgic encephalomyelitis/chronic fatigue syndrome: Secondary | ICD-10-CM | POA: Diagnosis not present

## 2022-10-15 DIAGNOSIS — Z7982 Long term (current) use of aspirin: Secondary | ICD-10-CM | POA: Diagnosis not present

## 2022-10-15 DIAGNOSIS — I700 Atherosclerosis of aorta: Secondary | ICD-10-CM | POA: Diagnosis not present

## 2022-10-15 DIAGNOSIS — M1990 Unspecified osteoarthritis, unspecified site: Secondary | ICD-10-CM | POA: Diagnosis not present

## 2022-10-15 DIAGNOSIS — Z7952 Long term (current) use of systemic steroids: Secondary | ICD-10-CM | POA: Diagnosis not present

## 2022-10-15 DIAGNOSIS — J849 Interstitial pulmonary disease, unspecified: Secondary | ICD-10-CM | POA: Diagnosis not present

## 2022-10-15 DIAGNOSIS — J9611 Chronic respiratory failure with hypoxia: Secondary | ICD-10-CM | POA: Diagnosis not present

## 2022-10-15 DIAGNOSIS — E291 Testicular hypofunction: Secondary | ICD-10-CM | POA: Diagnosis not present

## 2022-10-15 DIAGNOSIS — E43 Unspecified severe protein-calorie malnutrition: Secondary | ICD-10-CM | POA: Diagnosis not present

## 2022-10-15 DIAGNOSIS — J42 Unspecified chronic bronchitis: Secondary | ICD-10-CM | POA: Diagnosis not present

## 2022-10-15 DIAGNOSIS — E785 Hyperlipidemia, unspecified: Secondary | ICD-10-CM | POA: Diagnosis not present

## 2022-10-15 DIAGNOSIS — Z431 Encounter for attention to gastrostomy: Secondary | ICD-10-CM | POA: Diagnosis not present

## 2022-10-16 ENCOUNTER — Telehealth: Payer: Self-pay | Admitting: *Deleted

## 2022-10-16 ENCOUNTER — Encounter: Payer: Self-pay | Admitting: *Deleted

## 2022-10-16 NOTE — Transitions of Care (Post Inpatient/ED Visit) (Signed)
10/16/2022  Name: Earl Thomas MRN: [id number] DOB: 05/06/1951  Today's TOC FU Call Status: Today's TOC FU Call Status:: Successful TOC FU Call Competed TOC FU Call Complete Date: 10/16/22  Transition Care Management Follow-up Telephone Call Date of Discharge: 10/14/22 Discharge Facility: [facility]) Type of Discharge: Inpatient Admission Primary Inpatient Discharge Diagnosis:: Unexplained weight loss in setting of recent G-tube placement on 10/03/22 How have you been since you were released from the hospital?: Better ("I am doing okay; they taught my wife how to give me the feedings while we were at the hospital, so that is going better now.  I still eat some food by mouth, and I take all of my medicines by mouth") Any questions or concerns?: No  Items Reviewed: Did you receive and understand the discharge instructions provided?: Yes (thoroughly reviewed with patient who verbalizes very good understanding of same) Medications obtained and verified?: Yes (Medications Reviewed) (Full medication review completed; no concerns or discrepancies identified; confirmed patient obtained/ is taking all newly Rx'd medications as instructed; self-manages medications and denies questions/ concerns around medications today) Any new allergies since your discharge?: No Dietary orders reviewed?: Yes Type of Diet Ordered:: "Healthy as possible;" takes tube feeds at home in addition to eating by mouth Do you have support at home?: Yes People in Home: spouse Name of Support/Comfort Primary Source: reports he is independent in self-care activities; supportive spouse assists as needed/ indicated  Home Care and Equipment/Supplies: Were Home Health Services Ordered?: Yes Name of Home Health Agency:: [facility]: services were resumed post-hospital discharge on 10/14/22 Has Agency set up a time to come to your home?: Yes First Home Health Visit Date: 10/15/22 (reports nurse from home  health "came yesterday") Any new equipment or medical supplies ordered?: No (has tube feeding equipment and home O2 at baseline- prior to recent hospitalization)  Functional Questionnaire: Do you need assistance with bathing/showering or dressing?: No Do you need assistance with meal preparation?: Yes Do you need assistance with eating?: No Do you have difficulty maintaining continence: No Do you need assistance with getting out of bed/getting out of a chair/moving?: No Do you have difficulty managing or taking your medications?: No  Follow up appointments reviewed: PCP Follow-up appointment confirmed?: Yes (care coordination outreach in real-time with scheduling care guide to successfully schedule hospital follow up PCP appointment 10/21/22) Date of PCP follow-up appointment?: 10/21/22 Follow-up Provider: PCP Specialist Hospital Follow-up appointment confirmed?: Yes Date of Specialist follow-up appointment?: 10/24/22 Follow-Up Specialty Provider:: cardiology provider-- reports this was a routine follow up scheduled prior to hospitalization and states he may re-schedule Do you need transportation to your follow-up appointment?: No Do you understand care options if your condition(s) worsen?: Yes-patient verbalized understanding  SDOH Interventions Today    Flowsheet Row Most Recent Value  SDOH Interventions   Food Insecurity Interventions Intervention Not Indicated  Transportation Interventions Intervention Not Indicated  [reports spouse provides transportation]      TOC Interventions Today    Flowsheet Row Most Recent Value  TOC Interventions   TOC Interventions Discussed/Reviewed TOC Interventions Discussed, Arranged PCP follow up within 7 days/Care Guide scheduled  [provided my direct contact information should questions/ concerns/ needs arise post-TOC call, prior to RN CM telephone visit]      Interventions Today    Flowsheet Row Most Recent Value  Chronic Disease    Chronic disease during today's visit Other  [unexplained weight loss/ ILD]  General Interventions   General Interventions Discussed/Reviewed General  Interventions Discussed, Communication with, Doctor Visits, Referral to Nurse  Doctor Visits Discussed/Reviewed Doctor Visits Discussed, Specialist, PCP  PCP/Specialist Visits Compliance with follow-up visit  Communication with RN  [scheduled telephone visit with RN CM for 10/28/22, post provider visits]  Education Interventions   Education Provided Provided Education  Provided Verbal Education On Medication  [safe use of NTG at home,  Full medication review with updating medication list in EHR per patient report,  talking points to cover re: meds during 10/21/22 HFU PCP OV]  Nutrition Interventions   Nutrition Discussed/Reviewed Nutrition Discussed, Supplmental nutrition  [tube feedings at home]  Pharmacy Interventions   Pharmacy Dicussed/Reviewed Pharmacy Topics Discussed      Caryl Pina, RN, BSN, CCRN Alumnus RN CM Care Coordination/ Transition of Care- [facility] Care Management [phone]: direct office

## 2022-10-17 ENCOUNTER — Other Ambulatory Visit: Payer: Self-pay

## 2022-10-17 DIAGNOSIS — K625 Hemorrhage of anus and rectum: Secondary | ICD-10-CM

## 2022-10-17 DIAGNOSIS — Z860100 Personal history of colon polyps, unspecified: Secondary | ICD-10-CM

## 2022-10-17 DIAGNOSIS — Z1211 Encounter for screening for malignant neoplasm of colon: Secondary | ICD-10-CM | POA: Insufficient documentation

## 2022-10-17 DIAGNOSIS — J8417 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: Secondary | ICD-10-CM

## 2022-10-17 DIAGNOSIS — M069 Rheumatoid arthritis, unspecified: Secondary | ICD-10-CM

## 2022-10-17 HISTORY — DX: Hemorrhage of anus and rectum: K62.5

## 2022-10-17 HISTORY — DX: Rheumatoid arthritis, unspecified: M06.9

## 2022-10-17 HISTORY — DX: Encounter for screening for malignant neoplasm of colon: Z12.11

## 2022-10-17 HISTORY — DX: Personal history of colon polyps, unspecified: Z86.0100

## 2022-10-17 HISTORY — DX: 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: J84.170

## 2022-10-18 DIAGNOSIS — J9611 Chronic respiratory failure with hypoxia: Secondary | ICD-10-CM | POA: Diagnosis not present

## 2022-10-18 DIAGNOSIS — Z431 Encounter for attention to gastrostomy: Secondary | ICD-10-CM | POA: Diagnosis not present

## 2022-10-18 DIAGNOSIS — I700 Atherosclerosis of aorta: Secondary | ICD-10-CM | POA: Diagnosis not present

## 2022-10-18 DIAGNOSIS — J849 Interstitial pulmonary disease, unspecified: Secondary | ICD-10-CM | POA: Diagnosis not present

## 2022-10-18 DIAGNOSIS — E43 Unspecified severe protein-calorie malnutrition: Secondary | ICD-10-CM | POA: Diagnosis not present

## 2022-10-18 DIAGNOSIS — J42 Unspecified chronic bronchitis: Secondary | ICD-10-CM | POA: Diagnosis not present

## 2022-10-21 ENCOUNTER — Encounter: Payer: Self-pay | Admitting: Emergency Medicine

## 2022-10-21 ENCOUNTER — Ambulatory Visit (INDEPENDENT_AMBULATORY_CARE_PROVIDER_SITE_OTHER): Payer: Medicare Other | Admitting: Emergency Medicine

## 2022-10-21 ENCOUNTER — Other Ambulatory Visit: Payer: Self-pay

## 2022-10-21 ENCOUNTER — Telehealth: Payer: Self-pay | Admitting: Emergency Medicine

## 2022-10-21 VITALS — BP 104/72 | HR 110 | Temp 98.1°F | Ht 65.0 in | Wt 91.4 lb

## 2022-10-21 DIAGNOSIS — Z515 Encounter for palliative care: Secondary | ICD-10-CM

## 2022-10-21 DIAGNOSIS — J849 Interstitial pulmonary disease, unspecified: Secondary | ICD-10-CM | POA: Diagnosis not present

## 2022-10-21 DIAGNOSIS — Z09 Encounter for follow-up examination after completed treatment for conditions other than malignant neoplasm: Secondary | ICD-10-CM

## 2022-10-21 DIAGNOSIS — M0510 Rheumatoid lung disease with rheumatoid arthritis of unspecified site: Secondary | ICD-10-CM

## 2022-10-21 DIAGNOSIS — J9611 Chronic respiratory failure with hypoxia: Secondary | ICD-10-CM | POA: Diagnosis not present

## 2022-10-21 DIAGNOSIS — I5032 Chronic diastolic (congestive) heart failure: Secondary | ICD-10-CM | POA: Diagnosis not present

## 2022-10-21 DIAGNOSIS — E43 Unspecified severe protein-calorie malnutrition: Secondary | ICD-10-CM | POA: Diagnosis not present

## 2022-10-21 MED ORDER — XYOSTED 75 MG/0.5ML ~~LOC~~ SOAJ: 75.0000 mg | SUBCUTANEOUS | 5 refills | Status: DC

## 2022-10-21 NOTE — Telephone Encounter (Signed)
Medication sent to patient pharmacy.

## 2022-10-21 NOTE — Assessment & Plan Note (Signed)
Making progress in gaining weight. Feels better.  No complications. Feeding tube working well.  No concerns.

## 2022-10-21 NOTE — Telephone Encounter (Signed)
Patient called and would like to speak with a nurse about whether or not he needs to keep his heart doctor appointment on 10/24/22. He would like a callback at [phone].

## 2022-10-21 NOTE — Assessment & Plan Note (Signed)
Stable.  No signs of congestive heart failure

## 2022-10-21 NOTE — Progress Notes (Unsigned)
Earl Thomas 72 y.o.   Chief Complaint  Patient presents with   Hospitalization Follow-up    Patient was set up with [facility] needs for his tube feeding,   Lump on nipple right side     HISTORY OF PRESENT ILLNESS: This is a 72 y.o. male here for follow-up of recent hospital discharge Accompanied by wife. Doing better and gaining weight through his newly placed feeding tube. On testosterone supplementation weekly.  Has developed small nontender lump to left nipple area.  HPI   Prior to Admission medications   Medication Sig Start Date End Date Taking? Authorizing Provider  acetaminophen (TYLENOL) 500 MG tablet Take 1,000 mg by mouth every 6 (six) hours as needed for headache (pain).    [provider]  albuterol (VENTOLIN HFA) 108 (90 Base) MCG/ACT inhaler Inhale 1-2 puffs into the lungs every 6 (six) hours as needed for wheezing or shortness of breath. 06/17/22   Parrett, Virgel Bouquet, NP  ascorbic acid (VITAMIN C) 500 MG tablet Take 500 mg by mouth daily.    [provider]  aspirin EC 81 MG tablet Take 81 mg by mouth every morning.    [provider]  atorvastatin (LIPITOR) 10 MG tablet Take 1 tablet (10 mg total) by mouth daily. 04/30/22   Georgina Quint, MD  azelastine (ASTELIN) 0.1 % nasal spray Place 2 sprays into both nostrils 2 (two) times daily. Use in each nostril as directed Patient taking differently: Place 2 sprays into both nostrils 2 (two) times daily as needed for rhinitis or allergies (congestion). Use in each nostril as directed 12/25/21   Ferol Luz, MD  Brimonidine Tartrate (LUMIFY) 0.025 % SOLN Place 1 drop into both eyes every Sunday.    [provider]  cetirizine (ZYRTEC) 10 MG tablet Take 10 mg by mouth daily.    [provider]  dronabinol (MARINOL) 2.5 MG capsule Take 1 capsule (2.5 mg total) by mouth 2 (two) times daily before a meal. Patient not taking: Reported on 10/16/2022 04/18/22   Charlott Holler,  MD  famotidine (PEPCID) 20 MG tablet Take 1 tablet (20 mg total) by mouth 2 (two) times daily. 04/30/22   Georgina Quint, MD  folic acid ([name]) 1 MG tablet Take 1 mg by mouth every morning. Patient not taking: Reported on 10/16/2022 08/27/19   [provider]  guaifenesin (ROBITUSSIN) 100 MG/5ML syrup Take 200 mg by mouth 3 (three) times daily as needed for cough or congestion.    [provider]  HYDROcodone-acetaminophen (NORCO) 5-325 MG tablet Take 1 tablet by mouth every 6 (six) hours as needed for moderate pain. Patient not taking: Reported on 10/16/2022 10/03/22   Brayton El, PA-C  hydrocortisone (PROCTOZONE-HC) 2.5 % rectal cream Place 1 Application rectally 2 (two) times daily. 05/08/22   Georgina Quint, MD  levocetirizine (XYZAL) 5 MG tablet TAKE 1 TABLET BY MOUTH ONCE DAILY IN THE EVENING 07/09/22   Ferol Luz, MD  loperamide (IMODIUM) 2 MG capsule Take 1 capsule (2 mg total) by mouth 4 (four) times daily as needed for diarrhea or loose stools. 06/07/22   Long, Arlyss Repress, MD  meloxicam (MOBIC) 7.5 MG tablet Take 1 tablet (7.5 mg total) by mouth daily as needed for pain. 06/04/22   Parrett, Virgel Bouquet, NP  Naphazoline HCl (CLEAR EYES OP) Place 1 drop into both eyes daily.    [provider]  nitroGLYCERIN (NITROSTAT) 0.4 MG SL tablet Place 0.4 mg under the tongue  every 5 (five) minutes as needed for chest pain.    [provider]  ondansetron (ZOFRAN-ODT) 4 MG disintegrating tablet Take 1 tablet (4 mg total) by mouth every 8 (eight) hours as needed. 06/07/22   Long, Arlyss Repress, MD  riTUXimab (RITUXAN IV) Inject into the vein.    [provider]  Testosterone Enanthate (XYOSTED) 75 MG/0.5ML SOAJ Inject 75 mg into the skin once a week. 08/06/22   Georgina Quint, MD    No Known Allergies  Patient Active Problem List   Diagnosis Date Noted   Hemorrhage of rectum and anus 10/17/2022   Personal history of colonic polyps  10/17/2022   Screening for malignant neoplasm of colon 10/17/2022   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 10/17/2022   Rheumatoid arthritis 10/17/2022   Allergic rhinitis 10/11/2022   Chronic hypoxic respiratory failure 08/30/2022   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   Chronic bronchitis 06/17/2022   Protein-calorie malnutrition, severe 06/17/2022   GERD (gastroesophageal reflux disease) 01/19/2022   Chronic diastolic CHF (congestive heart failure) 01/19/2022   Arthritis 12/25/2021   Hyperlipidemia 12/25/2021   Chronic fatigue syndrome 12/04/2021   Other long term (current) drug therapy 12/04/2021   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   Arthralgia of temporomandibular joint 12/04/2021   Abnormal weight loss 12/04/2021   Family history of prostate cancer 12/04/2021   Aortic atherosclerosis 12/04/2021   Skin lesion of chest wall 12/04/2021   Interstitial lung disease 07/16/2019   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   Long-term current use of high risk medication other than anticoagulant 07/16/2019   BMI 26.0-26.9,adult 10/22/2016   Renal insufficiency 04/29/2016   Glucose intolerance (impaired glucose tolerance) 04/29/2016   Dyslipidemia 03/10/2012   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    Past Medical History:  Diagnosis Date   Abnormal weight loss 12/04/2021   Allergic rhinitis 10/11/2022   Aortic atherosclerosis 12/04/2021   Arthralgia of temporomandibular joint 12/04/2021   Arthritis    BMI 26.0-26.9,adult 10/22/2016   Chronic bronchitis 06/17/2022   Chronic diastolic CHF (congestive heart failure) 01/19/2022   Chronic fatigue syndrome 12/04/2021   Chronic hypoxic respiratory failure 08/30/2022   Dyslipidemia 03/10/2012   Family history of prostate cancer 12/04/2021   GERD (gastroesophageal reflux disease) 01/19/2022   Glucose intolerance (impaired glucose tolerance)    Hemorrhage of rectum  and anus 10/17/2022   Hyperlipidemia    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   Interstitial lung disease 07/16/2019   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 10/17/2022   Long-term current use of high risk medication other than anticoagulant 07/16/2019   Other long term (current) drug therapy 12/04/2021   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   Screening for malignant neoplasm of colon 10/17/2022   Skin lesion of chest wall 12/04/2021    Past Surgical History:  Procedure Laterality Date   IR GASTROSTOMY TUBE MOD SED  10/03/2022   ROTATOR CUFF REPAIR Right 01/02/16    Social History   Socioeconomic History   Marital status: Married    Spouse name: Not on file   Number of children: 2   Years of education: Not on file   Highest education level: Not on file  Occupational History   Occupation:  Professor  Tobacco Use   Smoking status: Former    Types: Pipe    Quit date: 10/06/1988    Years since quitting: 34.0    Passive exposure: Never   Smokeless tobacco: Never   Tobacco comments:    parent smoked in home as a child.  Smoked a pip in college, not daily.  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      Tobacco:  pipe in 1980s      Alcohol: none      Exercise: joined [employer]; going 2-3 times per week.        Seatbelt:  100%   Social Determinants of Health   Financial Resource Strain: Low Risk  (11/19/2021)   Overall Financial Resource Strain (CARDIA)    Difficulty of Paying Living Expenses: Not hard at all  Food Insecurity: No Food Insecurity (10/16/2022)   Hunger Vital Sign    Worried About Running Out of Food in the Last Year: Never true    Ran Out of Food in the Last Year: Never true  Transportation Needs: No Transportation Needs (10/16/2022)   PRAPARE - Administrator, Civil Service (Medical): No    Lack of Transportation (Non-Medical): No  Physical Activity: Not on file  Stress: No Stress Concern Present (11/19/2021)   [employer] of Occupational Health - Occupational Stress Questionnaire    Feeling of Stress : Not at all  Social Connections: Socially Integrated (11/19/2021)   Social Connection and Isolation Panel [NHANES]    Frequency of Communication with Friends and Family: More than three times a week    Frequency of Social Gatherings with Friends and Family: More than three times a week    Attends Religious Services: More than 4 times per year    Active Member of [employer] or Organizations: Yes    Attends Engineer, structural: More than 4 times per year    Marital Status: Married  Catering manager Violence: Not At Risk (10/10/2022)   Humiliation, Afraid, Rape, and Kick questionnaire    Fear of Current or Ex-Partner: No    Emotionally Abused: No    Physically Abused: No    Sexually Abused: No    Family History  Problem Relation Age of Onset   Cancer Father 7       prostate cancer   Diabetes Father    Heart disease Father 68       AMI late 80s   Cancer Mother 1       Breast cancer   Heart disease Mother        CABG at age 83   Diabetes Brother    Heart disease Brother        AMI x 2; CABG   Hyperlipidemia Brother      Review of Systems   Constitutional: Negative.  Negative for chills and fever.  HENT: Negative.  Negative for congestion and sore throat.   Respiratory: Negative.  Negative for cough and shortness  of breath.   Cardiovascular: Negative.  Negative for chest pain and palpitations.  Gastrointestinal:  Negative for abdominal pain, diarrhea, nausea and vomiting.  Skin: Negative.  Negative for rash.  Neurological: Negative.  Negative for dizziness and headaches.  All other systems reviewed and are negative.   Vitals:   10/21/22 1331  BP: 104/72  Pulse: (!) 110  Temp: 98.1 F (36.7 C)  SpO2: 96%    Physical Exam Vitals reviewed.  HENT:     Head: Normocephalic.  Eyes:     Extraocular Movements: Extraocular movements intact.     Pupils: Pupils are equal, round, and reactive to light.  Cardiovascular:     Rate and Rhythm: Normal rate and regular rhythm.     Pulses: Normal pulses.     Heart sounds: Normal heart sounds.  Pulmonary:     Effort: Pulmonary effort is normal.     Breath sounds: Normal breath sounds.  Abdominal:     Palpations: Abdomen is soft.     Tenderness: There is no abdominal tenderness.  Skin:    General: Skin is warm and dry.     Capillary Refill: Capillary refill takes less than 2 seconds.  Neurological:     Mental Status: He is alert and oriented to person, place, and time.  Psychiatric:        Mood and Affect: Mood normal.        Behavior: Behavior normal.    ASSESSMENT & PLAN: A total of 46 minutes was spent with the patient and counseling/coordination of care regarding preparing for this visit, review of most recent office visit notes, review of multiple chronic medical conditions under management, review of all medications, review of most recent blood work results, education on nutrition, prognosis, documentation, need for follow-up.  Problem List Items Addressed This Visit       Cardiovascular and Mediastinum   Chronic diastolic CHF (congestive heart failure)     Stable.  No signs of congestive heart failure      Relevant Orders   Amb Referral to Palliative Care     Respiratory   Interstitial lung disease    Stable.  Oxygen dependent. In no respiratory distress.  Oxygenating well. Follows up with pulmonary doctor on a regular basis      Relevant Orders   Amb Referral to Palliative Care   Rheumatoid lung disease with rheumatoid arthritis    Stable.  Oxygen dependent. In no apparent respiratory distress Follows up with pulmonary doctor on a regular basis      Relevant Orders   Amb Referral to Palliative Care   Chronic hypoxic respiratory failure   Relevant Orders   Amb Referral to Palliative Care     Other   Protein-calorie malnutrition, severe - Primary    Making progress in gaining weight. Feels better.  No complications. Feeding tube working well.  No concerns.      Relevant Orders   Amb Referral to Palliative Care   Other Visit Diagnoses     Hospital discharge follow-up          Patient Instructions  Health Maintenance After Age 82 After age 92, you are at a higher risk for certain long-term diseases and infections as well as injuries from falls. Falls are a major cause of broken bones and head injuries in people who are older than age 5. Getting regular preventive care can help to keep you healthy and well. Preventive care includes getting regular testing and making lifestyle changes as  recommended by your health care provider. Talk with your health care provider about: Which screenings and tests you should have. A screening is a test that checks for a disease when you have no symptoms. A diet and exercise plan that is right for you. What should I know about screenings and tests to prevent falls? Screening and testing are the best ways to find a health problem early. Early diagnosis and treatment give you the best chance of managing medical conditions that are common after age 4. Certain conditions and lifestyle choices  may make you more likely to have a fall. Your health care provider may recommend: Regular vision checks. Poor vision and conditions such as cataracts can make you more likely to have a fall. If you wear glasses, make sure to get your prescription updated if your vision changes. Medicine review. Work with your health care provider to regularly review all of the medicines you are taking, including over-the-counter medicines. Ask your health care provider about any side effects that may make you more likely to have a fall. Tell your health care provider if any medicines that you take make you feel dizzy or sleepy. Strength and balance checks. Your health care provider may recommend certain tests to check your strength and balance while standing, walking, or changing positions. Foot health exam. Foot pain and numbness, as well as not wearing proper footwear, can make you more likely to have a fall. Screenings, including: Osteoporosis screening. Osteoporosis is a condition that causes the bones to get weaker and break more easily. Blood pressure screening. Blood pressure changes and medicines to control blood pressure can make you feel dizzy. Depression screening. You may be more likely to have a fall if you have a fear of falling, feel depressed, or feel unable to do activities that you used to do. Alcohol use screening. Using too much alcohol can affect your balance and may make you more likely to have a fall. Follow these instructions at home: Lifestyle Do not drink alcohol if: Your health care provider tells you not to drink. If you drink alcohol: Limit how much you have to: 0-1 drink a day for women. 0-2 drinks a day for men. Know how much alcohol is in your drink. In the [place] one drink equals one 12 oz bottle of beer (355 mL), one 5 oz glass of wine (148 mL), or one 1 oz glass of hard liquor (44 mL). Do not use any products that contain nicotine or tobacco. These products include cigarettes,  chewing tobacco, and vaping devices, such as e-cigarettes. If you need help quitting, ask your health care provider. Activity  Follow a regular exercise program to stay fit. This will help you maintain your balance. Ask your health care provider what types of exercise are appropriate for you. If you need a cane or walker, use it as recommended by your health care provider. Wear supportive shoes that have nonskid soles. Safety  Remove any tripping hazards, such as rugs, cords, and clutter. Install safety equipment such as grab bars in bathrooms and safety rails on stairs. Keep rooms and walkways well-lit. General instructions Talk with your health care provider about your risks for falling. Tell your health care provider if: You fall. Be sure to tell your health care provider about all falls, even ones that seem minor. You feel dizzy, tiredness (fatigue), or off-balance. Take over-the-counter and prescription medicines only as told by your health care provider. These include supplements. Eat a healthy diet and maintain  a healthy weight. A healthy diet includes low-fat dairy products, low-fat (lean) meats, and fiber from whole grains, beans, and lots of fruits and vegetables. Stay current with your vaccines. Schedule regular health, dental, and eye exams. Summary Having a healthy lifestyle and getting preventive care can help to protect your health and wellness after age 58. Screening and testing are the best way to find a health problem early and help you avoid having a fall. Early diagnosis and treatment give you the best chance for managing medical conditions that are more common for people who are older than age 47. Falls are a major cause of broken bones and head injuries in people who are older than age 49. Take precautions to prevent a fall at home. Work with your health care provider to learn what changes you can make to improve your health and wellness and to prevent falls. This  information is not intended to replace advice given to you by your health care provider. Make sure you discuss any questions you have with your health care provider. Document Revised: 11/13/2020 Document Reviewed: 11/13/2020 Elsevier Patient Education  2023 Elsevier Inc.    Edwina Barth, MD [facility]

## 2022-10-21 NOTE — Assessment & Plan Note (Signed)
Stable.  Oxygen dependent. In no apparent respiratory distress Follows up with pulmonary doctor on a regular basis

## 2022-10-21 NOTE — Telephone Encounter (Signed)
Called patient and informed him to contact the cardiologist office to see if that appt is still needed

## 2022-10-21 NOTE — Patient Instructions (Signed)
Health Maintenance After Age 70 After age 70, you are at a higher risk for certain long-term diseases and infections as well as injuries from falls. Falls are a major cause of broken bones and head injuries in people who are older than age 70. Getting regular preventive care can help to keep you healthy and well. Preventive care includes getting regular testing and making lifestyle changes as recommended by your health care provider. Talk with your health care provider about: Which screenings and tests you should have. A screening is a test that checks for a disease when you have no symptoms. A diet and exercise plan that is right for you. What should I know about screenings and tests to prevent falls? Screening and testing are the best ways to find a health problem early. Early diagnosis and treatment give you the best chance of managing medical conditions that are common after age 70. Certain conditions and lifestyle choices may make you more likely to have a fall. Your health care provider may recommend: Regular vision checks. Poor vision and conditions such as cataracts can make you more likely to have a fall. If you wear glasses, make sure to get your prescription updated if your vision changes. Medicine review. Work with your health care provider to regularly review all of the medicines you are taking, including over-the-counter medicines. Ask your health care provider about any side effects that may make you more likely to have a fall. Tell your health care provider if any medicines that you take make you feel dizzy or sleepy. Strength and balance checks. Your health care provider may recommend certain tests to check your strength and balance while standing, walking, or changing positions. Foot health exam. Foot pain and numbness, as well as not wearing proper footwear, can make you more likely to have a fall. Screenings, including: Osteoporosis screening. Osteoporosis is a condition that causes  the bones to get weaker and break more easily. Blood pressure screening. Blood pressure changes and medicines to control blood pressure can make you feel dizzy. Depression screening. You may be more likely to have a fall if you have a fear of falling, feel depressed, or feel unable to do activities that you used to do. Alcohol use screening. Using too much alcohol can affect your balance and may make you more likely to have a fall. Follow these instructions at home: Lifestyle Do not drink alcohol if: Your health care provider tells you not to drink. If you drink alcohol: Limit how much you have to: 0-1 drink a day for women. 0-2 drinks a day for men. Know how much alcohol is in your drink. In the [place] one drink equals one 12 oz bottle of beer (355 mL), one 5 oz glass of wine (148 mL), or one 1 oz glass of hard liquor (44 mL). Do not use any products that contain nicotine or tobacco. These products include cigarettes, chewing tobacco, and vaping devices, such as e-cigarettes. If you need help quitting, ask your health care provider. Activity  Follow a regular exercise program to stay fit. This will help you maintain your balance. Ask your health care provider what types of exercise are appropriate for you. If you need a cane or walker, use it as recommended by your health care provider. Wear supportive shoes that have nonskid soles. Safety  Remove any tripping hazards, such as rugs, cords, and clutter. Install safety equipment such as grab bars in bathrooms and safety rails on stairs. Keep rooms and walkways   well-lit. General instructions Talk with your health care provider about your risks for falling. Tell your health care provider if: You fall. Be sure to tell your health care provider about all falls, even ones that seem minor. You feel dizzy, tiredness (fatigue), or off-balance. Take over-the-counter and prescription medicines only as told by your health care provider. These include  supplements. Eat a healthy diet and maintain a healthy weight. A healthy diet includes low-fat dairy products, low-fat (lean) meats, and fiber from whole grains, beans, and lots of fruits and vegetables. Stay current with your vaccines. Schedule regular health, dental, and eye exams. Summary Having a healthy lifestyle and getting preventive care can help to protect your health and wellness after age 70. Screening and testing are the best way to find a health problem early and help you avoid having a fall. Early diagnosis and treatment give you the best chance for managing medical conditions that are more common for people who are older than age 70. Falls are a major cause of broken bones and head injuries in people who are older than age 70. Take precautions to prevent a fall at home. Work with your health care provider to learn what changes you can make to improve your health and wellness and to prevent falls. This information is not intended to replace advice given to you by your health care provider. Make sure you discuss any questions you have with your health care provider. Document Revised: 11/13/2020 Document Reviewed: 11/13/2020 Elsevier Patient Education  2023 Elsevier Inc.  

## 2022-10-21 NOTE — Assessment & Plan Note (Signed)
Stable.  Oxygen dependent. In no respiratory distress.  Oxygenating well. Follows up with pulmonary doctor on a regular basis

## 2022-10-21 NOTE — Telephone Encounter (Signed)
Prescription Request  10/21/2022  LOV: 10/21/2022  What is the name of the medication or equipment?    Testosterone Enanthate (XYOSTED) 75 MG/0.5ML  Have you contacted your pharmacy to request a refill? No   Which pharmacy would you like this sent to?     [facility] [address], [place] - [address] [place] [address] Phone: [phone] Fax: [phone]   Please advise at Mobile [phone] (mobile)

## 2022-10-24 ENCOUNTER — Ambulatory Visit: Payer: Medicare Other | Admitting: Cardiology

## 2022-10-25 DIAGNOSIS — E43 Unspecified severe protein-calorie malnutrition: Secondary | ICD-10-CM | POA: Diagnosis not present

## 2022-10-25 DIAGNOSIS — J849 Interstitial pulmonary disease, unspecified: Secondary | ICD-10-CM | POA: Diagnosis not present

## 2022-10-25 DIAGNOSIS — Z431 Encounter for attention to gastrostomy: Secondary | ICD-10-CM | POA: Diagnosis not present

## 2022-10-25 DIAGNOSIS — I700 Atherosclerosis of aorta: Secondary | ICD-10-CM | POA: Diagnosis not present

## 2022-10-25 DIAGNOSIS — J9611 Chronic respiratory failure with hypoxia: Secondary | ICD-10-CM | POA: Diagnosis not present

## 2022-10-25 DIAGNOSIS — J42 Unspecified chronic bronchitis: Secondary | ICD-10-CM | POA: Diagnosis not present

## 2022-10-28 ENCOUNTER — Ambulatory Visit: Payer: Self-pay

## 2022-10-28 ENCOUNTER — Other Ambulatory Visit: Payer: Self-pay

## 2022-10-28 VITALS — BP 90/58 | HR 104 | Temp 97.2°F | Resp 32

## 2022-10-28 DIAGNOSIS — Z515 Encounter for palliative care: Secondary | ICD-10-CM

## 2022-10-28 NOTE — Patient Instructions (Signed)
Visit Information  Thank you for taking time to visit with me today. Please don't hesitate to contact me if I can be of assistance to you.   Following are the goals we discussed today:  Continue to take medications as prescribed Continue to attend provider visits as scheduled Continue to work with Home health as recommended. Contact provider with any healthy questions or concerns  Our next appointment is by telephone on 11/25/22 at 10:30 am  Please call the care guide team at [phone] if you need to cancel or reschedule your appointment.   If you are experiencing a Mental Health or Behavioral Health Crisis or need someone to talk to, please call the Suicide and Crisis Lifeline: [phone]

## 2022-10-28 NOTE — Patient Outreach (Signed)
  Care Coordination   Initial Visit Note   10/28/2022 Name: Jashad Depaula MRN: [id number] DOB: Dec 18, 1950  Rod Majerus is a 72 y.o. year old male who sees Sagardia, Eilleen Kempf, MD for primary care. I spoke with  Blossom Hoops and spouse(speaker) by phone today.  What matters to the patients health and wellness today?  Mr. Langston states he is doing better. He reports active with home health nurse. And reports Palliative care scheduled to make a home visit today. He denies any questions or concerns at this time.  Goals Addressed             This Visit's Progress    continue to improve post hospitalization       Interventions Today    Flowsheet Row Most Recent Value  Chronic Disease   Chronic disease during today's visit Other  [post hospitalization-severe protein calorie malnutrition,  ILD]  General Interventions   General Interventions Discussed/Reviewed General Interventions Discussed, Doctor Visits, Durable Medical Equipment (DME)  Doctor Visits Discussed/Reviewed Doctor Visits Discussed  Durable Medical Equipment (DME) Oxygen  [O2 at 3l/[place]]  PCP/Specialist Visits Compliance with follow-up visit  Education Interventions   Education Provided Provided Education  Provided Verbal Education On When to see the doctor, Other  [care coordination program,  reviewed upcoming appointments, confirmed Palliative care contacted patient and initial visit scheduled for today.]  Nutrition Interventions   Nutrition Discussed/Reviewed Nutrition Discussed  [tube feedings-denies any questions]  Pharmacy Interventions   Pharmacy Dicussed/Reviewed Pharmacy Topics Discussed           SDOH assessments and interventions completed:  Yes Recently completed. Confirmed no changes at this time.   Care Coordination Interventions:  Yes, provided   Follow up plan: Follow up call scheduled for 11/25/22    Encounter Outcome:  Pt. Visit Completed   Kathyrn Sheriff, RN, MSN, BSN,  CCM Care Management Coordinator [phone]

## 2022-10-28 NOTE — Progress Notes (Unsigned)
PATIENT NAME: Earl Thomas DOB: 1951-01-29 MRN: [id number]  PRIMARY CARE PROVIDER: Georgina Quint, MD  RESPONSIBLE PARTY:  Acct ID - Guarantor Home Phone Work Phone Relationship Acct Type  [id number] TERY, HOEGER* [phone] Self P/F     [address] [place], [place] [address]    Palliative Care Initial Encounter Note   Completed home visit. Wife Luster Landsberg also present.     HISTORY OF PRESENT ILLNESS:  ILD  TODAY'S VISIT:  Respiratory: SOB and wearing an Inogen machine  Cardiac: pt reports no CHF; no edema  Cognitive: alert and oriented   Appetite: 3 reduced meals; drinks 3-4 cups of fluid daily; was sent to a nutritionist; takes meds and some food PO; has tube feedings - Osmolite  GI/GU: has a BM daily; no urinary issues   Mobility: ambulates independently  ADLs:  able to shower, groom, and dress self; all other ADLs are performed by pt's wife   Sleeping Pattern: sleeps through the night most of the time but does wake up due to dryness of the throat and coughing  Pain: denies pain at this time  Wt: 91 lbs when last weighed on 4.15.24  Palliative Care/ Hospice: LPN explained role and purpose of palliative care including visit frequency. Also discussed benefits of hospice care as well as the differences between the two with patient.   Goals of Care: To stay in the home; to have a lung transplant    Next Appt Scheduled For: 11/18/22 @ 11:30am     CODE STATUS: Full Code ADVANCED DIRECTIVES: Y MOST FORM: N PPS: 70%   PHYSICAL EXAM:   VITALS: Today's Vitals   10/28/22 1453  BP: (!) 90/58  Pulse: (!) 104  Resp: (!) 32  Temp: (!) 97.2 F (36.2 C)  TempSrc: Temporal  SpO2: 98%    LUNGS: decreased breath sounds, expiratory wheezes bilaterally CARDIAC: Cor RRR EXTREMITIES: AROM x4;  SKIN: dry, cool, intact NEURO: negative       Jadier Rockers Clementeen Graham, LPN

## 2022-10-31 DIAGNOSIS — Z431 Encounter for attention to gastrostomy: Secondary | ICD-10-CM | POA: Diagnosis not present

## 2022-10-31 DIAGNOSIS — J849 Interstitial pulmonary disease, unspecified: Secondary | ICD-10-CM | POA: Diagnosis not present

## 2022-10-31 DIAGNOSIS — E43 Unspecified severe protein-calorie malnutrition: Secondary | ICD-10-CM | POA: Diagnosis not present

## 2022-10-31 DIAGNOSIS — J42 Unspecified chronic bronchitis: Secondary | ICD-10-CM | POA: Diagnosis not present

## 2022-10-31 DIAGNOSIS — J9611 Chronic respiratory failure with hypoxia: Secondary | ICD-10-CM | POA: Diagnosis not present

## 2022-10-31 DIAGNOSIS — I700 Atherosclerosis of aorta: Secondary | ICD-10-CM | POA: Diagnosis not present

## 2022-11-04 ENCOUNTER — Ambulatory Visit: Payer: Medicare Other | Admitting: Emergency Medicine

## 2022-11-07 ENCOUNTER — Encounter: Payer: Self-pay | Admitting: Internal Medicine

## 2022-11-07 ENCOUNTER — Ambulatory Visit (INDEPENDENT_AMBULATORY_CARE_PROVIDER_SITE_OTHER): Payer: Medicare Other | Admitting: Internal Medicine

## 2022-11-07 VITALS — BP 118/66 | HR 107 | Temp 97.9°F | Ht 65.0 in | Wt 96.8 lb

## 2022-11-07 DIAGNOSIS — J9611 Chronic respiratory failure with hypoxia: Secondary | ICD-10-CM | POA: Diagnosis not present

## 2022-11-07 DIAGNOSIS — M0510 Rheumatoid lung disease with rheumatoid arthritis of unspecified site: Secondary | ICD-10-CM

## 2022-11-07 MED ORDER — ALBUTEROL SULFATE HFA 108 (90 BASE) MCG/ACT IN AERS
1.0000 | INHALATION_SPRAY | Freq: Four times a day (QID) | RESPIRATORY_TRACT | 5 refills | Status: DC | PRN
Start: 2022-11-07 — End: 2023-06-03

## 2022-11-07 NOTE — Progress Notes (Signed)
Earl Thomas    [id number]    Apr 11, 1951  Primary Care Physician:Sagardia, Eilleen Kempf, MD Date of Appointment: 11/07/2022 Established Patient Visit  Chief complaint:   Chief Complaint  Patient presents with   Hospitalization Follow-up    Pt is doing better sine discharged-slowly gaining weight. Has feeding tube to help with weight gain    HPI: Earl Thomas is a 72 y.o. gentleman with history of RA-ILD. Intolerant of Ofev due to diarrhea. Had progression on  esbriet as well as weight loss and diarrhea.. CT scan showed progression. Discussed with Dr. Nickola Major on rituxumab  Interval Updates: Here for follow up.  Has seen [facility] and is not currently transplant candidate due to weight. Has had a G tube placed to help with nutritional supplementation. Has gained some weight since then.   Stopped methotrexate due to concerns for GI side effects and this has helped.  Having loose stools however due to the nutritional supplementation.  Since being discharged from pulmonary rehab has been essentially home bound. Used wheelchair to come into the office.      I have reviewed the patient's family social and past medical history and updated as appropriate.   Past Medical History:  Diagnosis Date   Abnormal weight loss 12/04/2021   Allergic rhinitis 10/11/2022   Aortic atherosclerosis (HCC) 12/04/2021   Arthralgia of temporomandibular joint 12/04/2021   Arthritis    BMI 26.0-26.9,adult 10/22/2016   Chronic bronchitis (HCC) 06/17/2022   Chronic diastolic CHF (congestive heart failure) (HCC) 01/19/2022   Chronic fatigue syndrome 12/04/2021   Chronic hypoxic respiratory failure (HCC) 08/30/2022   Dyslipidemia 03/10/2012   Family history of prostate cancer 12/04/2021   GERD (gastroesophageal reflux disease) 01/19/2022   Glucose intolerance (impaired glucose tolerance)    Hemorrhage of rectum and anus 10/17/2022   Hyperlipidemia     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   Interstitial lung disease (HCC) 07/16/2019   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 (HCC) 10/17/2022   Long-term current use of high risk medication other than anticoagulant 07/16/2019   Other long term (current) drug therapy 12/04/2021   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   Screening for malignant neoplasm of colon 10/17/2022   Skin lesion of chest wall 12/04/2021    Past Surgical History:  Procedure Laterality Date   IR GASTROSTOMY TUBE MOD SED  10/03/2022   ROTATOR CUFF REPAIR Right 01/02/16    Family History  Problem Relation Age of Onset   Cancer Father 11       prostate cancer   Diabetes Father    Heart disease Father 45       AMI late 39s   Cancer Mother 59       Breast cancer   Heart disease Mother        CABG at age 36   Diabetes Brother    Heart disease Brother        AMI x 2; CABG   Hyperlipidemia Brother     Social History   Occupational History   Occupation: Professor  Tobacco Use   Smoking status: Former    Types: Pipe    Quit date:  10/06/1988    Years since quitting: 34.1    Passive exposure: Never   Smokeless tobacco: Never   Tobacco comments:    parent smoked in home as a child.  Smoked a pip in college, not daily.  Vaping Use   Vaping Use: Never used  Substance and Sexual Activity   Alcohol use: No   Drug use: No   Sexual activity: Yes    Physical Exam: Blood pressure 118/66, pulse (!) 107, temperature 97.9 F (36.6 C), temperature source Oral, height 5\' 5"  (1.651 m), weight 96 lb 12.8 oz (43.9 kg), SpO2 97 %.  Gen:      Thin Lungs:    bibasilar crackles R>L, tachypnia, on nasal cannula CV:     resting tachycardia MSK:     bilateral MCP and PCP joints are swollen, tender Ext: clubbing, no edema  Data Reviewed: Imaging: I have personally reviewed the CT scan from Nov 2020 which demonstrates bibasilar honeycombing consistent with UIP pattern. On the 2023 feb scan there is progression of disease.   Echocardiogram 1. Left ventricular ejection fraction, by estimation, is 60 to 65%. The  left ventricle has normal function. The left ventricle has no regional  wall motion abnormalities. Left ventricular diastolic parameters are  consistent with Grade I diastolic  dysfunction (impaired relaxation).   2. Right ventricular systolic function is normal. The right ventricular  size is normal. There is normal pulmonary artery systolic pressure.   3. The mitral valve is normal in structure. Mild mitral valve  regurgitation. No evidence of mitral stenosis.   4. The aortic valve is normal in structure. Aortic valve regurgitation is  not visualized. No aortic stenosis is present.   5. The inferior vena cava is normal in size with greater than 50%  respiratory variability, suggesting right atrial pressure of 3 mmHg.   PFTs:      Latest Ref Rng & Units 10/29/2021    3:00 PM 04/18/2021    3:05 PM 11/13/2020    3:07 PM 08/07/2020   10:04 AM 07/26/2019   10:56 AM  PFT Results  FVC-Pre L 1.22  1.71  1.71  1.37  1.52   FVC-Predicted Pre % 38  54  54  43  47   FVC-Post L     1.82   FVC-Predicted Post %     57   Pre FEV1/FVC % % 99  94  92  100  100   Post FEV1/FCV % %     88   FEV1-Pre L 1.21  1.61  1.57  1.37  1.52   FEV1-Predicted Pre % 50  67  65  57  63   FEV1-Post L     1.61   DLCO uncorrected ml/min/mmHg 8.80  10.08  11.16  9.68  11.79   DLCO UNC% % 39  45  49  42  52   DLCO corrected ml/min/mmHg 8.80   11.16  9.68    DLCO COR %Predicted % 39   49  42    DLVA Predicted % 55  71  104  73  82   TLC L     3.42    TLC % Predicted %     57   RV % Predicted %     84    I have personally reviewed the patient's PFTs and which show moderate restriction with an FVC of 54% which is stable from previous PFTs.  PFTs at [facility] Jan 2024 show FVC 1.08,  dlco severely reduced   Labs: I have reviewed his outside hospital labs noted on March 23 2019 progress note, his CCP level was greater than 255, Rheumatoid factor was 149 his, ESR was 109 his uric acid was normal.  Lab Results  Component Value Date   NA 135 10/13/2022   K 4.6 10/13/2022   CO2 30 10/13/2022   GLUCOSE 82 10/13/2022   BUN 12 10/13/2022   CREATININE 0.93 10/13/2022   CALCIUM 8.5 (L) 10/13/2022   GFRNONAA >60 10/13/2022   Lab Results  Component Value Date   WBC 4.8 10/11/2022   HGB 11.8 (L) 10/11/2022   HCT 36.9 (L) 10/11/2022   MCV 88.5 10/11/2022   PLT 167 10/11/2022     Immunization status: Immunization History  Administered Date(s) Administered   Fluad Quad(high Dose 65+) 04/13/2020, 04/18/2021, 03/11/2022   Hep A / Hep B 02/12/2022   Influenza, High Dose Seasonal PF 04/24/2019   Influenza,inj,Quad PF,6+ Mos 05/30/2015, 04/23/2016, 05/14/2017, 03/25/2018   Influenza-Unspecified 04/07/2014   PFIZER(Purple Top)SARS-COV-2 Vaccination 07/30/2019, 08/20/2019, 04/27/2020   PNEUMOCOCCAL CONJUGATE-20 12/25/2020   Pfizer Covid-19 Vaccine Bivalent Booster 53yrs & up 04/24/2021   Pneumococcal Conjugate-13 05/18/2018   Pneumococcal Polysaccharide-23 06/09/2019   Td 03/10/2021   Tdap 02/13/2009, 05/14/2017   Zoster Recombinat (Shingrix) 05/27/2018, 01/19/2019   Zoster, Live 04/12/2014    Assessment:  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 Methotrexate stopped due to adverse effects (gastrointestinal) Continue rituximab and meloxicam for RA.  Continue tube feeds. Can try over the counter fiber and imodium to help with the loose stools.  Discussed repeat referral to pulmonary rehab for next stage. May need to repeat test at next visit.  Goal weight 120 lbs  At last [facility] visit Jan 2024 Walked 972 ft on 2L ( ) which is decreased from 1041 ft in August.   I spent 34 minutes in the care of this patient today including pre-charting, chart review, review of results, face-to-face care, coordination of care and communication with consultants etc.).   Return to Care: Return in about 3 months (around 02/07/2023).   Durel Salts, MD Pulmonary and Critical Care Medicine [facility] Office:[phone]

## 2022-11-07 NOTE — Patient Instructions (Signed)
Please schedule follow up scheduled with myself in 3 months.  If my schedule is not open yet, we will contact you with a reminder closer to that time. Please call [phone] if you haven't heard from [place] a month before.   Try over the counter fiber and imodium to help with the loose stools.   Continue symbicort, albuterol.  Continue rituxumab.  I will re-refer to pulmonary rehab for the next stage. They will call you to schedule.

## 2022-11-08 DIAGNOSIS — J42 Unspecified chronic bronchitis: Secondary | ICD-10-CM | POA: Diagnosis not present

## 2022-11-08 DIAGNOSIS — J9611 Chronic respiratory failure with hypoxia: Secondary | ICD-10-CM | POA: Diagnosis not present

## 2022-11-08 DIAGNOSIS — I700 Atherosclerosis of aorta: Secondary | ICD-10-CM | POA: Diagnosis not present

## 2022-11-08 DIAGNOSIS — J849 Interstitial pulmonary disease, unspecified: Secondary | ICD-10-CM | POA: Diagnosis not present

## 2022-11-08 DIAGNOSIS — E43 Unspecified severe protein-calorie malnutrition: Secondary | ICD-10-CM | POA: Diagnosis not present

## 2022-11-08 DIAGNOSIS — Z431 Encounter for attention to gastrostomy: Secondary | ICD-10-CM | POA: Diagnosis not present

## 2022-11-14 DIAGNOSIS — J309 Allergic rhinitis, unspecified: Secondary | ICD-10-CM | POA: Diagnosis not present

## 2022-11-14 DIAGNOSIS — I700 Atherosclerosis of aorta: Secondary | ICD-10-CM | POA: Diagnosis not present

## 2022-11-14 DIAGNOSIS — G9332 Myalgic encephalomyelitis/chronic fatigue syndrome: Secondary | ICD-10-CM | POA: Diagnosis not present

## 2022-11-14 DIAGNOSIS — Z7952 Long term (current) use of systemic steroids: Secondary | ICD-10-CM | POA: Diagnosis not present

## 2022-11-14 DIAGNOSIS — I5032 Chronic diastolic (congestive) heart failure: Secondary | ICD-10-CM | POA: Diagnosis not present

## 2022-11-14 DIAGNOSIS — J849 Interstitial pulmonary disease, unspecified: Secondary | ICD-10-CM | POA: Diagnosis not present

## 2022-11-14 DIAGNOSIS — J9611 Chronic respiratory failure with hypoxia: Secondary | ICD-10-CM | POA: Diagnosis not present

## 2022-11-14 DIAGNOSIS — M26629 Arthralgia of temporomandibular joint, unspecified side: Secondary | ICD-10-CM | POA: Diagnosis not present

## 2022-11-14 DIAGNOSIS — Z7982 Long term (current) use of aspirin: Secondary | ICD-10-CM | POA: Diagnosis not present

## 2022-11-14 DIAGNOSIS — K219 Gastro-esophageal reflux disease without esophagitis: Secondary | ICD-10-CM | POA: Diagnosis not present

## 2022-11-14 DIAGNOSIS — E785 Hyperlipidemia, unspecified: Secondary | ICD-10-CM | POA: Diagnosis not present

## 2022-11-14 DIAGNOSIS — Z431 Encounter for attention to gastrostomy: Secondary | ICD-10-CM | POA: Diagnosis not present

## 2022-11-14 DIAGNOSIS — M1990 Unspecified osteoarthritis, unspecified site: Secondary | ICD-10-CM | POA: Diagnosis not present

## 2022-11-14 DIAGNOSIS — E43 Unspecified severe protein-calorie malnutrition: Secondary | ICD-10-CM | POA: Diagnosis not present

## 2022-11-14 DIAGNOSIS — J42 Unspecified chronic bronchitis: Secondary | ICD-10-CM | POA: Diagnosis not present

## 2022-11-14 DIAGNOSIS — M0510 Rheumatoid lung disease with rheumatoid arthritis of unspecified site: Secondary | ICD-10-CM | POA: Diagnosis not present

## 2022-11-14 DIAGNOSIS — E291 Testicular hypofunction: Secondary | ICD-10-CM | POA: Diagnosis not present

## 2022-11-15 DIAGNOSIS — E43 Unspecified severe protein-calorie malnutrition: Secondary | ICD-10-CM | POA: Diagnosis not present

## 2022-11-15 DIAGNOSIS — J849 Interstitial pulmonary disease, unspecified: Secondary | ICD-10-CM | POA: Diagnosis not present

## 2022-11-15 DIAGNOSIS — I700 Atherosclerosis of aorta: Secondary | ICD-10-CM | POA: Diagnosis not present

## 2022-11-15 DIAGNOSIS — J42 Unspecified chronic bronchitis: Secondary | ICD-10-CM | POA: Diagnosis not present

## 2022-11-15 DIAGNOSIS — Z431 Encounter for attention to gastrostomy: Secondary | ICD-10-CM | POA: Diagnosis not present

## 2022-11-15 DIAGNOSIS — J9611 Chronic respiratory failure with hypoxia: Secondary | ICD-10-CM | POA: Diagnosis not present

## 2022-11-18 ENCOUNTER — Other Ambulatory Visit: Payer: Self-pay

## 2022-11-18 VITALS — BP 94/58 | HR 105 | Temp 99.0°F | Resp 30

## 2022-11-18 DIAGNOSIS — Z515 Encounter for palliative care: Secondary | ICD-10-CM

## 2022-11-18 NOTE — Progress Notes (Signed)
PATIENT NAME: Earl Thomas DOB: [birth year] MRN: [id number]  PRIMARY CARE PROVIDER: Georgina Quint, MD  RESPONSIBLE PARTY:  Acct ID - Guarantor Home Phone Work Phone Relationship Acct Type  [id number] KALYN, AMIRIAN* [phone] Self P/F     [address] [place], [place] [address]   Palliative Care Initial Encounter Note    Completed home visit. Wife Luster Landsberg also present.     HISTORY OF PRESENT ILLNESS:  ILD   TODAY'S VISIT:   Respiratory: SOB and wearing an Inogen machine; O2 @ 3L; pt does turn this up to 4L when SOB; pt's wife discussed pulmonary PT; pt's doctor wants him stronger   Cardiac: no edema noted this visit   Cognitive: alert and oriented   Appetite: 3 reduced meals; drinks 3-4 cups of fluid daily; has 4 daily tube feedings - Osmolite   GI/GU: has a BM daily; recently pt had diarrhea and Pulmonologist told him to take Imodium to slow down/stop diarrhea; pt reports having to take it only once daily; BMs are soft or very soft; no urinary issues   Mobility: ambulates independently   ADLs: pt able to shower, groom, and dress self; all other ADLs are performed by pt's wife; pt has to rest every 2-3 step when going from the first to second floor   Sleeping Pattern: sleeps through the night most of the time but does wake up due to dryness of the throat and coughing more often    Pain: denies pain at this time   Wt: 91 lbs when last weighed on 4.15.24; pt weighed self and is 98 lbs today      Goals of Care: To stay in the home; to have a lung transplant at [facility]     Next Appt Scheduled For: 12/30/22 @ 11:30am         CODE STATUS: Full Code ADVANCED DIRECTIVES: Y MOST FORM: N PPS: 70%   PHYSICAL EXAM:   VITALS: Today's Vitals   11/18/22 1153  BP: (!) 94/58  Pulse: (!) 105  Resp: (!) 30  Temp: 99 F (37.2 C)  TempSrc: Temporal  SpO2: 91%  PainSc: 0-No pain    LUNGS: clear to auscultation  CARDIAC: Cor RRR EXTREMITIES: AROM  x4 SKIN: Skin color, texture, turgor normal. No rashes or lesions  NEURO: negative except for weakness       Nefi Musich Clementeen Graham, LPN

## 2022-11-18 NOTE — Progress Notes (Signed)
COMMUNITY PALLIATIVE CARE SW NOTE  PATIENT NAME: Earl Thomas DOB: 1950-12-25 MRN: [id number]  PRIMARY CARE PROVIDER: Georgina Quint, MD  RESPONSIBLE PARTY:  Acct ID - Guarantor Home Phone Work Phone Relationship Acct Type  [id number] [name], [name]* [phone] Self P/F     [address], [place], [place] [address]   Initial Palliative Care Encounter/Clinical Social Work  [employer] SW completed an initial telephonic encounter with patient's wife-.Renee. SW provided an introduction and education regarding palliative care services. She provided an initial consent to services.  She reported that they received a good report from the doctor today. He has lung cancer. They are considering getting a feeding tube. His weight has increased. Their goal is for patient to get a lung transplant. Patient is having difficulty walking and his doctor has ordered a  small wheelchair.  SW scheduled a follow-up appointment for 10/28/22 @ 2:30 pm.   Social History   Tobacco Use   Smoking status: Former    Types: Pipe    Quit date: 10/06/1988    Years since quitting: 34.1    Passive exposure: Never   Smokeless tobacco: Never   Tobacco comments:    parent smoked in home as a child.  Smoked a pip in college, not daily.  Substance Use Topics   Alcohol use: No    CODE STATUS: Full Code ADVANCED DIRECTIVES: Yes MOST FORM COMPLETE:  No HOSPICE EDUCATION PROVIDED: No  Duration of visit and documentation: 30 minutes  Galileah Piggee, LCSW

## 2022-11-21 ENCOUNTER — Telehealth: Payer: Self-pay

## 2022-11-21 NOTE — Telephone Encounter (Signed)
Contacted Earl Thomas to schedule their annual wellness visit. Appointment made for 12/09/22.  Earl Thomas, CMA (AAMA)  [facility] [phone]

## 2022-11-22 DIAGNOSIS — J849 Interstitial pulmonary disease, unspecified: Secondary | ICD-10-CM | POA: Diagnosis not present

## 2022-11-22 DIAGNOSIS — Z431 Encounter for attention to gastrostomy: Secondary | ICD-10-CM | POA: Diagnosis not present

## 2022-11-22 DIAGNOSIS — I700 Atherosclerosis of aorta: Secondary | ICD-10-CM | POA: Diagnosis not present

## 2022-11-22 DIAGNOSIS — E43 Unspecified severe protein-calorie malnutrition: Secondary | ICD-10-CM | POA: Diagnosis not present

## 2022-11-22 DIAGNOSIS — J9611 Chronic respiratory failure with hypoxia: Secondary | ICD-10-CM | POA: Diagnosis not present

## 2022-11-22 DIAGNOSIS — J42 Unspecified chronic bronchitis: Secondary | ICD-10-CM | POA: Diagnosis not present

## 2022-11-25 ENCOUNTER — Other Ambulatory Visit: Payer: Self-pay | Admitting: Internal Medicine

## 2022-11-25 ENCOUNTER — Ambulatory Visit: Payer: Self-pay

## 2022-11-25 ENCOUNTER — Telehealth: Payer: Self-pay | Admitting: Internal Medicine

## 2022-11-25 NOTE — Patient Instructions (Addendum)
Visit Information  Thank you for taking time to visit with me today. Please don't hesitate to contact me if I can be of assistance to you.   Following are the goals we discussed today:  Contact your pulmonologist regarding worsening of lung condition or any questions/concerns Continue to take medications as prescribed Continue to attend provider visits as recommended Continue to tube feeding as recommended and eat healthy. Notify provider if any questions or concerns Continue to work with Palliative care provider Contact your RN Care Coordinator if any care coordination needs  Our next appointment is by telephone on 12/20/22 at 10:30 am  Please call the care guide team at [phone] if you need to cancel or reschedule your appointment.   If you are experiencing a Mental Health or Behavioral Health Crisis or need someone to talk to, please call the Suicide and Crisis Lifeline: 69  Kathyrn Sheriff, RN, MSN, BSN, CCM [facility] Care Coordinator [phone]

## 2022-11-25 NOTE — Telephone Encounter (Signed)
Spoke to pt and he stated his o2 is more labored than usual and he needs to turn up his poc to 4L to help with his breathing. Also stated he has a cough and it's a rattle cough. He is taking all of his prescribed pulmonary medicine. I informed him I will send a message to Dr Celine Mans and give a call back with she answers. I also asked if we offer an appt if he will be willing to come in pt stated yes.

## 2022-11-25 NOTE — Patient Outreach (Signed)
  Care Coordination   Follow Up Visit Note   11/25/2022 Name: Earl Thomas MRN: [id number] DOB: 07-29-1950  Earl Thomas is a 72 y.o. year old male who sees Sagardia, Eilleen Kempf, MD for primary care. I spoke with  Earl Thomas and spouse by phone today.  What matters to the patients health and wellness today?  Earl Thomas reports he feels like he has increased SOB and cough. He denies bringing up anything with cough. He reports he is having to increase his oxygen from 3l/[place] to 4l/[place]. He states he has been using prn inhaler and taking his medications-per patient has not helped. Per Earl Thomas has been going on for about 4 days. He states his oxygen saturation has been in the 90's. Earl Thomas states she agrees that she feels Earl Thomas is having a little more difficulty with breathing issues. Earl Thomas states he will reach out to Dr. Celine Mans pulmonologist to notify.  Goals Addressed             This Visit's Progress    continue to improve post hospitalization       Interventions Today    Flowsheet Row Most Recent Value  Chronic Disease   Chronic disease during today's visit Other  [ILD]  General Interventions   General Interventions Discussed/Reviewed General Interventions Reviewed, Doctor Visits  Doctor Visits Discussed/Reviewed Doctor Visits Discussed, PCP  PCP/Specialist Visits Compliance with follow-up visit  [upcoming appointments reviewed]  Exercise Interventions   Exercise Discussed/Reviewed Exercise Reviewed  Education Interventions   Education Provided Provided Education  Provided Verbal Education On When to see the doctor, Medication, Exercise  [advised to follow up with provider to ask about re-referral to pulmonaryn rehab,  advised patient to contact provider re: increase SOB/cough and O2 increase.]  Nutrition Interventions   Nutrition Discussed/Reviewed Nutrition Reviewed  [continues with tube feeding and reports appetitie is better. states he is gaining  weight]  Pharmacy Interventions   Pharmacy Dicussed/Reviewed Pharmacy Topics Reviewed           SDOH assessments and interventions completed:  No  Care Coordination Interventions:  Yes, provided   Follow up plan: Follow up call scheduled for 12/20/22    Encounter Outcome:  Pt. Visit Completed   Kathyrn Sheriff, RN, MSN, BSN, CCM [facility] Care Coordinator [phone]

## 2022-11-25 NOTE — Telephone Encounter (Signed)
Patient states his breathing has been getting worse for the last week- states he is using Inogen machine and is having to bump it up to 4L which helps a little. Patient denied appt with [facility] on 5/22- wanted to send a message back first. Please advise.

## 2022-11-26 NOTE — Telephone Encounter (Signed)
Can we get some more information? Fevers, chills, productive cough or nonproductive? Is he taking rescue breathing treatments and are they helping? Are his saturations actually dropping or is he just turning up the oxygen to feel better?

## 2022-11-27 DIAGNOSIS — M0579 Rheumatoid arthritis with rheumatoid factor of multiple sites without organ or systems involvement: Secondary | ICD-10-CM | POA: Diagnosis not present

## 2022-11-27 DIAGNOSIS — R5383 Other fatigue: Secondary | ICD-10-CM | POA: Diagnosis not present

## 2022-11-27 DIAGNOSIS — Z79899 Other long term (current) drug therapy: Secondary | ICD-10-CM | POA: Diagnosis not present

## 2022-11-27 DIAGNOSIS — J849 Interstitial pulmonary disease, unspecified: Secondary | ICD-10-CM | POA: Diagnosis not present

## 2022-11-27 DIAGNOSIS — Z681 Body mass index (BMI) 19 or less, adult: Secondary | ICD-10-CM | POA: Diagnosis not present

## 2022-11-27 NOTE — Telephone Encounter (Signed)
Went over recc from Dr. Celine Mans. Patient verbalized understanding. No change with cough. nfN

## 2022-11-27 NOTE — Telephone Encounter (Signed)
Spoke with patient. States no fever or chills. Cough is dry. 02 sats have been around 94-95. He states they have not dropped any lower then 94. He has been turning up 02 on poc machine to help him breathe better.   Dr. Celine Mans please advise?

## 2022-11-27 NOTE — Telephone Encounter (Signed)
Ok to keep oxygen higher to keep his oxygen saturation over 90%.   He is going to have a cough with his ILD. Unless there is a change in the quality or nature of cough, production, associated signs for infection, I would not change much.

## 2022-11-29 DIAGNOSIS — E43 Unspecified severe protein-calorie malnutrition: Secondary | ICD-10-CM | POA: Diagnosis not present

## 2022-11-29 DIAGNOSIS — J849 Interstitial pulmonary disease, unspecified: Secondary | ICD-10-CM | POA: Diagnosis not present

## 2022-11-29 DIAGNOSIS — J9611 Chronic respiratory failure with hypoxia: Secondary | ICD-10-CM | POA: Diagnosis not present

## 2022-11-29 DIAGNOSIS — I700 Atherosclerosis of aorta: Secondary | ICD-10-CM | POA: Diagnosis not present

## 2022-11-29 DIAGNOSIS — Z431 Encounter for attention to gastrostomy: Secondary | ICD-10-CM | POA: Diagnosis not present

## 2022-11-29 DIAGNOSIS — J42 Unspecified chronic bronchitis: Secondary | ICD-10-CM | POA: Diagnosis not present

## 2022-12-09 ENCOUNTER — Ambulatory Visit (INDEPENDENT_AMBULATORY_CARE_PROVIDER_SITE_OTHER): Payer: Medicare Other

## 2022-12-09 VITALS — Ht 65.0 in | Wt 98.4 lb

## 2022-12-09 DIAGNOSIS — Z0000 Encounter for general adult medical examination without abnormal findings: Secondary | ICD-10-CM | POA: Diagnosis not present

## 2022-12-09 NOTE — Patient Instructions (Signed)
[name] , Thank you for taking time to come for your Medicare Wellness Visit. I appreciate your ongoing commitment to your health goals. Please review the following plan we discussed and let me know if I can assist you in the future.   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]  General Interventions   General Interventions Discussed/Reviewed General Interventions Reviewed, Doctor Visits  Doctor Visits Discussed/Reviewed Doctor Visits Discussed, PCP  PCP/Specialist Visits Compliance with follow-up visit  [upcoming appointments reviewed]  Exercise Interventions   Exercise Discussed/Reviewed Exercise Reviewed  Education Interventions   Education Provided Provided Education  Provided Verbal Education On When to see the doctor, Medication, Exercise  [advised to follow up with provider to ask about re-referral to pulmonaryn rehab,  advised patient to contact provider re: increase SOB/cough and O2 increase.]  Nutrition Interventions   Nutrition Discussed/Reviewed Nutrition Reviewed  [continues with tube feeding and reports appetitie is better. states he is gaining weight]  Pharmacy Interventions   Pharmacy Dicussed/Reviewed Pharmacy Topics Reviewed          My goal for 2024 is to have a lung transplant.        This is a list of the screening recommended for you and due dates:  Health Maintenance  Topic Date Due   COVID-19 Vaccine (5 - 2023-24 season) 03/08/2022   Flu Shot  02/06/2023   Colon Cancer Screening  06/22/2030   DTaP/Tdap/Td vaccine (4 - Td or Tdap) 03/11/2031   Pneumonia Vaccine  Completed   Hepatitis C Screening  Completed   Zoster (Shingles) Vaccine  Completed   HPV Vaccine  Aged Out    Advanced directives: Yes; documents are on file with [place]  Conditions/risks identified: Yes; Lung  Disease  Next appointment: Follow up in one year for your annual wellness visit.   Preventive Care 39 Years and Older, Male  Preventive care refers to lifestyle choices and visits with your health care provider that can promote health and wellness. What does preventive care include? A yearly physical exam. This is also called an annual well check. Dental exams once or twice a year. Routine eye exams. Ask your health care provider how often you should have your eyes checked. Personal lifestyle choices, including: Daily care of your teeth and gums. Regular physical activity. Eating a healthy diet. Avoiding tobacco and drug use. Limiting alcohol use. Practicing safe sex. Taking low doses of aspirin every day. Taking vitamin and mineral supplements as recommended by your health care provider. What happens during an annual well check? The services and screenings done by your health care provider during your annual well check will depend on your age, overall health, lifestyle risk factors, and family history of disease. Counseling  Your health care provider may ask you questions about your: Alcohol use. Tobacco use. Drug use. Emotional well-being. Home and relationship well-being. Sexual activity. Eating habits. History of falls. Memory and ability to understand (cognition). Work and work Astronomer. Screening  You may have the following tests or measurements: Height, weight, and BMI. Blood pressure. Lipid and cholesterol levels. These may be checked every 5 years, or more frequently if you are over 15 years old. Skin check. Lung cancer screening. You may have this screening every year starting at age 44 if you  have a 30-pack-year history of smoking and currently smoke or have quit within the past 15 years. Fecal occult blood test (FOBT) of the stool. You may have this test every year starting at age 17. Flexible sigmoidoscopy or colonoscopy. You may have a sigmoidoscopy every 5  years or a colonoscopy every 10 years starting at age 39. Prostate cancer screening. Recommendations will vary depending on your family history and other risks. Hepatitis C blood test. Hepatitis B blood test. Sexually transmitted disease (STD) testing. Diabetes screening. This is done by checking your blood sugar (glucose) after you have not eaten for a while (fasting). You may have this done every 1-3 years. Abdominal aortic aneurysm (AAA) screening. You may need this if you are a current or former smoker. Osteoporosis. You may be screened starting at age 60 if you are at high risk. Talk with your health care provider about your test results, treatment options, and if necessary, the need for more tests. Vaccines  Your health care provider may recommend certain vaccines, such as: Influenza vaccine. This is recommended every year. Tetanus, diphtheria, and acellular pertussis (Tdap, Td) vaccine. You may need a Td booster every 10 years. Zoster vaccine. You may need this after age 80. Pneumococcal 13-valent conjugate (PCV13) vaccine. One dose is recommended after age 33. Pneumococcal polysaccharide (PPSV23) vaccine. One dose is recommended after age 77. Talk to your health care provider about which screenings and vaccines you need and how often you need them. This information is not intended to replace advice given to you by your health care provider. Make sure you discuss any questions you have with your health care provider. Document Released: 07/21/2015 Document Revised: 03/13/2016 Document Reviewed: 04/25/2015 Elsevier Interactive Patient Education  2017 [employer].  Fall Prevention in the Home Falls can cause injuries. They can happen to people of all ages. There are many things you can do to make your home safe and to help prevent falls. What can I do on the outside of my home? Regularly fix the edges of walkways and driveways and fix any cracks. Remove anything that might make you  trip as you walk through a door, such as a raised step or threshold. Trim any bushes or trees on the path to your home. Use bright outdoor lighting. Clear any walking paths of anything that might make someone trip, such as rocks or tools. Regularly check to see if handrails are loose or broken. Make sure that both sides of any steps have handrails. Any raised decks and porches should have guardrails on the edges. Have any leaves, snow, or ice cleared regularly. Use sand or salt on walking paths during winter. Clean up any spills in your garage right away. This includes oil or grease spills. What can I do in the bathroom? Use night lights. Install grab bars by the toilet and in the tub and shower. Do not use towel bars as grab bars. Use non-skid mats or decals in the tub or shower. If you need to sit down in the shower, use a plastic, non-slip stool. Keep the floor dry. Clean up any water that spills on the floor as soon as it happens. Remove soap buildup in the tub or shower regularly. Attach bath mats securely with double-sided non-slip rug tape. Do not have throw rugs and other things on the floor that can make you trip. What can I do in the bedroom? Use night lights. Make sure that you have a light by your bed that is easy  to reach. Do not use any sheets or blankets that are too big for your bed. They should not hang down onto the floor. Have a firm chair that has side arms. You can use this for support while you get dressed. Do not have throw rugs and other things on the floor that can make you trip. What can I do in the kitchen? Clean up any spills right away. Avoid walking on wet floors. Keep items that you use a lot in easy-to-reach places. If you need to reach something above you, use a strong step stool that has a grab bar. Keep electrical cords out of the way. Do not use floor polish or wax that makes floors slippery. If you must use wax, use non-skid floor wax. Do not have  throw rugs and other things on the floor that can make you trip. What can I do with my stairs? Do not leave any items on the stairs. Make sure that there are handrails on both sides of the stairs and use them. Fix handrails that are broken or loose. Make sure that handrails are as long as the stairways. Check any carpeting to make sure that it is firmly attached to the stairs. Fix any carpet that is loose or worn. Avoid having throw rugs at the top or bottom of the stairs. If you do have throw rugs, attach them to the floor with carpet tape. Make sure that you have a light switch at the top of the stairs and the bottom of the stairs. If you do not have them, ask someone to add them for you. What else can I do to help prevent falls? Wear shoes that: Do not have high heels. Have rubber bottoms. Are comfortable and fit you well. Are closed at the toe. Do not wear sandals. If you use a stepladder: Make sure that it is fully opened. Do not climb a closed stepladder. Make sure that both sides of the stepladder are locked into place. Ask someone to hold it for you, if possible. Clearly mark and make sure that you can see: Any grab bars or handrails. First and last steps. Where the edge of each step is. Use tools that help you move around (mobility aids) if they are needed. These include: Canes. Walkers. Scooters. Crutches. Turn on the lights when you go into a dark area. Replace any light bulbs as soon as they burn out. Set up your furniture so you have a clear path. Avoid moving your furniture around. If any of your floors are uneven, fix them. If there are any pets around you, be aware of where they are. Review your medicines with your doctor. Some medicines can make you feel dizzy. This can increase your chance of falling. Ask your doctor what other things that you can do to help prevent falls. This information is not intended to replace advice given to you by your health care provider.  Make sure you discuss any questions you have with your health care provider. Document Released: 04/20/2009 Document Revised: 11/30/2015 Document Reviewed: 07/29/2014 Elsevier Interactive Patient Education  2017 [employer].

## 2022-12-09 NOTE — Progress Notes (Signed)
I connected with  Earl Thomas on 12/09/22 by a audio enabled telemedicine application and verified that I am speaking with the correct person using two identifiers.  Patient Location: Home  Provider Location: Office/Clinic  I discussed the limitations of evaluation and management by telemedicine. The patient expressed understanding and agreed to proceed.  Subjective:   Earl Thomas is a 72 y.o. male who presents for Medicare Annual/Subsequent preventive examination.  Review of Systems     Cardiac Risk Factors include: advanced age (>28men, >39 women);dyslipidemia;male gender;sedentary lifestyle;family history of premature cardiovascular disease     Objective:    Today's Vitals   12/09/22 1103  Weight: 98 lb 6.4 oz (44.6 kg)  Height: 5\' 5"  (1.651 m)  PainSc: 0-No pain   Body mass index is 16.37 kg/m.     12/09/2022   11:06 AM 10/10/2022    9:48 PM 10/03/2022   10:34 AM 03/01/2022    9:42 AM 01/18/2022    6:36 PM 12/26/2021    1:23 PM 11/19/2021    1:56 PM  Advanced Directives  Does Patient Have a Medical Advance Directive? Yes Yes Yes Yes No;Yes No No  Type of Estate agent of [place];Living will Living will;Healthcare Power of [employer] Power of [place];Living will Healthcare Power of [place];Living will Healthcare Power of [place];Living will    Does patient want to make changes to medical advance directive? No - Patient declined No - Patient declined No - Guardian declined No - Patient declined     Copy of Healthcare Power of Attorney in Chart? Yes - validated most recent copy scanned in chart (See row information) No - copy requested No - copy requested      Would patient like information on creating a medical advance directive?      No - Patient declined     Current Medications (verified) Outpatient Encounter Medications as of 12/09/2022  Medication Sig   acetaminophen (TYLENOL) 500 MG tablet Take 1,000 mg by mouth every 6  (six) hours as needed for headache (pain).   albuterol (VENTOLIN HFA) 108 (90 Base) MCG/ACT inhaler Inhale 1-2 puffs into the lungs every 6 (six) hours as needed for wheezing or shortness of breath.   ascorbic acid (VITAMIN C) 500 MG tablet Take 500 mg by mouth daily.   aspirin EC 81 MG tablet Take 81 mg by mouth every morning.   atorvastatin (LIPITOR) 10 MG tablet Take 1 tablet (10 mg total) by mouth daily.   azelastine (ASTELIN) 0.1 % nasal spray Place 2 sprays into both nostrils 2 (two) times daily. Use in each nostril as directed (Patient taking differently: Place 2 sprays into both nostrils 2 (two) times daily as needed for rhinitis or allergies (congestion). Use in each nostril as directed)   Brimonidine Tartrate (LUMIFY) 0.025 % SOLN Place 1 drop into both eyes every Sunday.   cetirizine (ZYRTEC) 10 MG tablet Take 10 mg by mouth daily.   dronabinol (MARINOL) 2.5 MG capsule Take 1 capsule (2.5 mg total) by mouth 2 (two) times daily before a meal. (Patient not taking: Reported on 11/25/2022)   famotidine (PEPCID) 20 MG tablet Take 1 tablet (20 mg total) by mouth 2 (two) times daily.   folic acid ([name]) 1 MG tablet Take 1 mg by mouth every morning. (Patient not taking: Reported on 11/25/2022)   guaifenesin (ROBITUSSIN) 100 MG/5ML syrup Take 200 mg by mouth 3 (three) times daily as needed for cough or congestion.   HYDROcodone-acetaminophen (NORCO) 5-325  MG tablet Take 1 tablet by mouth every 6 (six) hours as needed for moderate pain.   hydrocortisone (PROCTOZONE-HC) 2.5 % rectal cream Place 1 Application rectally 2 (two) times daily.   levocetirizine (XYZAL) 5 MG tablet TAKE 1 TABLET BY MOUTH ONCE DAILY IN THE EVENING   loperamide (IMODIUM) 2 MG capsule Take 1 capsule (2 mg total) by mouth 4 (four) times daily as needed for diarrhea or loose stools.   meloxicam (MOBIC) 7.5 MG tablet Take 1 tablet (7.5 mg total) by mouth daily as needed for pain.   Naphazoline HCl (CLEAR EYES OP) Place 1 drop  into both eyes daily.   nitroGLYCERIN (NITROSTAT) 0.4 MG SL tablet Place 0.4 mg under the tongue every 5 (five) minutes as needed for chest pain.   ondansetron (ZOFRAN-ODT) 4 MG disintegrating tablet Take 1 tablet (4 mg total) by mouth every 8 (eight) hours as needed.   riTUXimab (RITUXAN IV) Inject into the vein.   Testosterone Enanthate (XYOSTED) 75 MG/0.5ML SOAJ Inject 75 mg into the skin once a week.   No facility-administered encounter medications on file as of 12/09/2022.    Allergies (verified) Patient has no known allergies.   History: Past Medical History:  Diagnosis Date   Abnormal weight loss 12/04/2021   Allergic rhinitis 10/11/2022   Aortic atherosclerosis (HCC) 12/04/2021   Arthralgia of temporomandibular joint 12/04/2021   Arthritis    BMI 26.0-26.9,adult 10/22/2016   Chronic bronchitis (HCC) 06/17/2022   Chronic diastolic CHF (congestive heart failure) (HCC) 01/19/2022   Chronic fatigue syndrome 12/04/2021   Chronic hypoxic respiratory failure (HCC) 08/30/2022   Dyslipidemia 03/10/2012   Family history of prostate cancer 12/04/2021   GERD (gastroesophageal reflux disease) 01/19/2022   Glucose intolerance (impaired glucose tolerance)    Hemorrhage of rectum and anus 10/17/2022   Hyperlipidemia    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   Interstitial lung disease (HCC) 07/16/2019   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 (HCC) 10/17/2022   Long-term current use of high risk medication other than anticoagulant 07/16/2019   Other long term (current) drug therapy 12/04/2021   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   Screening for malignant neoplasm of colon 10/17/2022   Skin lesion of chest wall 12/04/2021   Past Surgical History:  Procedure Laterality Date   IR GASTROSTOMY TUBE MOD SED  10/03/2022   ROTATOR CUFF REPAIR Right 01/02/16   Family History  Problem Relation Age of Onset   Cancer Father 71       prostate cancer   Diabetes Father    Heart disease Father 76       AMI late 53s   Cancer Mother 6       Breast cancer   Heart disease Mother        CABG at age 26   Diabetes Brother    Heart disease Brother        AMI x 2; CABG   Hyperlipidemia Brother    Social History   Socioeconomic History   Marital status: Married    Spouse name: Not on file   Number of children: 2   Years of education: Not on file   Highest education level: Not on file  Occupational History   Occupation: Professor  Tobacco Use   Smoking status: Former    Types: Pipe    Quit date: 10/06/1988    Years since quitting: 34.1    Passive exposure: Never   Smokeless tobacco: Never   Tobacco comments:    parent smoked in home as a child.  Smoked a pip in college, not daily.  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      Tobacco: pipe in 1980s      Alcohol: none      Exercise: joined [employer]; going 2-3 times per week.        Seatbelt:  100%   Social Determinants of Health   Financial Resource Strain: Low Risk  (12/09/2022)   Overall Financial Resource Strain (CARDIA)    Difficulty of Paying Living Expenses: Not hard at all  Food Insecurity: No Food  Insecurity (12/09/2022)   Hunger Vital Sign    Worried About Running Out of Food in the Last Year: Never true    Ran Out of Food in the Last Year: Never true  Transportation Needs: No Transportation Needs (12/09/2022)   PRAPARE - Administrator, Civil Service (Medical): No    Lack of Transportation (Non-Medical): No  Physical Activity: Inactive (12/09/2022)   Exercise Vital Sign    Days of Exercise per Week: 0 days    Minutes of Exercise per Session: 0 min  Stress: No Stress Concern Present (12/09/2022)   [employer] of Occupational Health - Occupational Stress Questionnaire    Feeling of Stress : Not at all  Social Connections: Socially Integrated (12/09/2022)   Social Connection and Isolation Panel [NHANES]    Frequency of Communication with Friends and Family: More than three times a week    Frequency of Social Gatherings with Friends and Family: More than three times a week    Attends Religious Services: More than 4 times per year    Active Member of [employer] or Organizations: Yes    Attends Engineer, structural: More than 4 times per year    Marital Status: Married    Tobacco Counseling Counseling given: Not Answered Tobacco comments: parent smoked in home as a child.  Smoked a pip in college, not daily.   Clinical Intake:  Pre-visit preparation completed: Yes  Pain : No/denies pain Pain Score: 0-No pain     BMI - recorded: 16.37 Nutritional Status: BMI <19  Underweight Nutritional Risks: None Diabetes: No  How often do you need to have someone help you when you read instructions, pamphlets, or other written materials from your doctor or pharmacy?: 1 - Never What is the last grade level you completed in school?: Post Graduate; Professor  Diabetic? No  Interpreter Needed?: No  Information entered by :: Besan Ketchem N. Valena Ivanov, LPN.   Activities of Daily Living    12/09/2022   11:08 AM 10/10/2022    9:40 PM  In your present state of health, do you  have any difficulty performing the following activities:  Hearing? 0 0  Vision? 0 0  Difficulty concentrating or making decisions? 0 0  Walking or climbing stairs? 0 0  Dressing or bathing? 0 1  Doing errands, shopping? 0 0  Preparing Food and eating ? N   Using the Toilet? N   In the past six months, have you accidently leaked urine? N   Do you have problems with loss of bowel control? N   Managing your Medications? N   Managing your Finances? N   Housekeeping or managing your Housekeeping? N     Patient Care Team: Georgina Quint, MD as PCP - General (Internal Medicine) Jeani Hawking, MD as Consulting Physician (Gastroenterology) Sharin Mons as Physician Assistant (Emergency Medicine) Colletta [place], RN as [facility] Care Management [facility] as Consulting Physician (Optometry)  Indicate any recent Medical Services you may have received from other than [facility] providers in the past year (date may be approximate).     Assessment:   This is a routine wellness examination for [place].  Hearing/Vision screen Hearing Screening - Comments:: Denies hearing difficulties; no hearing aids.  Vision Screening - Comments:: Wears rx glasses - up to date with [facility] with [facility]   Dietary issues and exercise activities discussed: Current Exercise Habits: The patient does not participate in regular exercise at present, Exercise limited by: respiratory conditions(s);Other - see comments;cardiac condition(s) (Fatigue, CHF, lung disease, rheumatoid arthritis)   Goals Addressed             This Visit's Progress    My goal for 2024 is to have a lung transplant.        Depression Screen    12/09/2022   11:07 AM 10/21/2022    1:34 PM 08/06/2022    2:34 PM 05/14/2022    3:56 PM 04/30/2022   11:09 AM 03/01/2022    9:48 AM 01/28/2022   10:00 AM  PHQ 2/9 Scores  PHQ - 2 Score 0 0 0 0 0 0 0  PHQ- 9 Score 2   0  0      Fall Risk    12/09/2022   11:07 AM 10/21/2022    1:34 PM 08/06/2022    2:34 PM 04/30/2022   11:09 AM 03/01/2022    9:47 AM  Fall Risk   Falls in the past year? 0 0 0 0 0  Number falls in past yr: 0 0 0 0 0  Injury with Fall? 0 0 0 0 0  Risk for fall due to : No Fall Risks Impaired vision No Fall Risks No Fall Risks No Fall Risks  Follow up Falls prevention discussed Falls evaluation completed Falls evaluation completed Falls evaluation completed Education provided;Falls prevention discussed    FALL RISK PREVENTION PERTAINING TO THE HOME:  Any stairs in or around the home? Yes  If so, are there any without handrails? No  Home free of loose throw rugs in walkways, pet beds, electrical cords, etc? Yes  Adequate lighting in your home to reduce risk of falls? Yes   ASSISTIVE DEVICES UTILIZED TO PREVENT FALLS:  Life alert? No  Use of a cane, walker or w/c? No  Grab bars in the bathroom? Yes  Shower chair or bench in shower? Yes  Elevated toilet seat or a handicapped toilet? No   TIMED UP AND GO:  Was the test performed? No . Telephonic Visit   Cognitive Function:  12/09/2022   11:10 AM 11/19/2021    1:42 PM 05/14/2019    8:05 AM  6CIT Screen  What Year? 0 points 0 points 0 points  What month? 0 points 0 points 0 points  What time? 0 points 0 points 0 points  Count back from 20 0 points 0 points 0 points  Months in reverse 0 points 0 points 2 points  Repeat phrase 0 points 0 points 0 points  Total Score 0 points 0 points 2 points    Immunizations Immunization History  Administered Date(s) Administered   Fluad Quad(high Dose 65+) 04/13/2020, 04/18/2021, 03/11/2022   Hep A / Hep B 02/12/2022   Influenza, High Dose Seasonal PF 04/24/2019   Influenza,inj,Quad PF,6+ Mos 05/30/2015, 04/23/2016, 05/14/2017, 03/25/2018   Influenza-Unspecified 04/07/2014   PFIZER(Purple Top)SARS-COV-2 Vaccination 07/30/2019, 08/20/2019, 04/27/2020   PNEUMOCOCCAL CONJUGATE-20 12/25/2020    Pfizer Covid-19 Vaccine Bivalent Booster 69yrs & up 04/24/2021   Pneumococcal Conjugate-13 05/18/2018   Pneumococcal Polysaccharide-23 06/09/2019   Td 03/10/2021   Tdap 02/13/2009, 05/14/2017   Zoster Recombinat (Shingrix) 05/27/2018, 01/19/2019   Zoster, Live 04/12/2014    TDAP status: Up to date  Flu Vaccine status: Up to date  Pneumococcal vaccine status: Up to date  Covid-19 vaccine status: Completed vaccines  Qualifies for Shingles Vaccine? Yes   Zostavax completed Yes   Shingrix Completed?: Yes  Screening Tests Health Maintenance  Topic Date Due   COVID-19 Vaccine (5 - 2023-24 season) 03/08/2022   INFLUENZA VACCINE  02/06/2023   Colonoscopy  06/22/2030   DTaP/Tdap/Td (4 - Td or Tdap) 03/11/2031   Pneumonia Vaccine 79+ Years old  Completed   Hepatitis C Screening  Completed   Zoster Vaccines- Shingrix  Completed   HPV VACCINES  Aged Out    Health Maintenance  Health Maintenance Due  Topic Date Due   COVID-19 Vaccine (5 - 2023-24 season) 03/08/2022    Colorectal cancer screening: Type of screening: Colonoscopy. Completed 06/22/2020. Repeat every 10 years  Lung Cancer Screening: (Low Dose CT Chest recommended if Age 65-80 years, 30 pack-year currently smoking OR have quit w/in 15years.) does not qualify.   Lung Cancer Screening Referral: no  Additional Screening:  Hepatitis C Screening: does qualify; Completed 02/22/2015  Vision Screening: Recommended annual ophthalmology exams for early detection of glaucoma and other disorders of the eye. Is the patient up to date with their annual eye exam?  Yes  Who is the provider or what is the name of the office in which the patient attends annual eye exams? [facility] If pt is not established with a provider, would they like to be referred to a provider to establish care? No .   Dental Screening: Recommended annual dental exams for proper oral hygiene  Community Resource Referral / Chronic Care  Management: CRR required this visit?  No   CCM required this visit?  No      Plan:     I have personally reviewed and noted the following in the patient's chart:   Medical and social history Use of alcohol, tobacco or illicit drugs  Current medications and supplements including opioid prescriptions. Patient is currently taking opioid prescriptions. Information provided to patient regarding non-opioid alternatives. Patient advised to discuss non-opioid treatment plan with their provider. Functional ability and status Nutritional status Physical activity Advanced directives List of other physicians Hospitalizations, surgeries, and ER visits in previous 12 months Vitals Screenings to include cognitive, depression, and falls Referrals and appointments  In addition,  I have reviewed and discussed with patient certain preventive protocols, quality metrics, and best practice recommendations. A written personalized care plan for preventive services as well as general preventive health recommendations were provided to patient.     Mickeal Needy, LPN   07/13/1094   Nurse Notes: Normal cognitive status assessed by direct observation via telephone conversation by this Nurse Health Advisor. No abnormalities found.

## 2022-12-13 DIAGNOSIS — E43 Unspecified severe protein-calorie malnutrition: Secondary | ICD-10-CM | POA: Diagnosis not present

## 2022-12-13 DIAGNOSIS — J849 Interstitial pulmonary disease, unspecified: Secondary | ICD-10-CM | POA: Diagnosis not present

## 2022-12-13 DIAGNOSIS — Z431 Encounter for attention to gastrostomy: Secondary | ICD-10-CM | POA: Diagnosis not present

## 2022-12-13 DIAGNOSIS — J42 Unspecified chronic bronchitis: Secondary | ICD-10-CM | POA: Diagnosis not present

## 2022-12-13 DIAGNOSIS — I700 Atherosclerosis of aorta: Secondary | ICD-10-CM | POA: Diagnosis not present

## 2022-12-13 DIAGNOSIS — J9611 Chronic respiratory failure with hypoxia: Secondary | ICD-10-CM | POA: Diagnosis not present

## 2022-12-17 ENCOUNTER — Ambulatory Visit: Payer: Medicare Other | Admitting: Emergency Medicine

## 2022-12-20 ENCOUNTER — Encounter: Payer: Self-pay | Admitting: Internal Medicine

## 2022-12-20 ENCOUNTER — Ambulatory Visit: Payer: Self-pay

## 2022-12-20 ENCOUNTER — Ambulatory Visit (INDEPENDENT_AMBULATORY_CARE_PROVIDER_SITE_OTHER): Payer: Medicare Other | Admitting: Internal Medicine

## 2022-12-20 VITALS — BP 88/68 | HR 106 | Temp 98.5°F | Ht 65.0 in | Wt 98.4 lb

## 2022-12-20 DIAGNOSIS — J189 Pneumonia, unspecified organism: Secondary | ICD-10-CM | POA: Diagnosis not present

## 2022-12-20 DIAGNOSIS — J849 Interstitial pulmonary disease, unspecified: Secondary | ICD-10-CM

## 2022-12-20 MED ORDER — AMOXICILLIN-POT CLAVULANATE 875-125 MG PO TABS
1.0000 | ORAL_TABLET | Freq: Two times a day (BID) | ORAL | 0 refills | Status: DC
Start: 2022-12-20 — End: 2023-01-20

## 2022-12-20 NOTE — Patient Instructions (Signed)
Visit Information  Thank you for taking time to visit with me today. Please don't hesitate to contact me if I can be of assistance to you.   Following are the goals we discussed today:  You have an appointment with your pulmonologist today(12/20/22) at 1:30 pm. Continue to attend provider visits as scheduled Continue to take medications as prescribed   The Care Guide will call you to schedule your next telephone follow up. Or you can call the care guide team at [phone] to schedule your appointment.   If you are experiencing a Mental Health or Behavioral Health Crisis or need someone to talk to, please call the Suicide and Crisis Lifeline: 70  Kathyrn Sheriff, RN, MSN, BSN, CCM [facility] Care Coordinator [phone]

## 2022-12-20 NOTE — Patient Outreach (Signed)
  Care Coordination   Follow Up Visit Note   12/20/2022 Name: Shaquielle Granger MRN: [id number] DOB: Aug 29, 1950  Flabio Vradenburg is a 72 y.o. year old male who sees Sagardia, Eilleen Kempf, MD for primary care. I spoke with  Blossom Hoops and Mrs. Rennis Harding by phone today.  What matters to the patients health and wellness today?  Mr. Milan reports an increase in brownish-yellowish sputum. He states he has brought up 3-4 x this morning . Mrs. Stokely reports she has noticed a difference in patient's cough at night s "deep rattling cough". Mr. Ajayi also reports he is more SOB with activities such at going to the bathroom. He continues to wear O2 at 3-4L/[place] an states oxygen saturations have been >/=90 %. Reports not sure how much inhaler helps related to the time it takes to improve breathing. Mr. Bogusz reports home health is no longer involved in care.   Goals Addressed             This Visit's Progress    continue to improve post hospitalization       Interventions Today    Flowsheet Row Most Recent Value  Chronic Disease   Chronic disease during today's visit Other  [ILD, chronic hypoxic respiratory failure]  General Interventions   General Interventions Discussed/Reviewed General Interventions Reviewed, Communication with, Doctor Visits  Doctor Visits Discussed/Reviewed Doctor Visits Reviewed  [upcoming appointments reviewed]  Communication with PCP/Specialists  [facility] assisted with arranging sooner appointment]  Pharmacy Interventions   Pharmacy Dicussed/Reviewed Pharmacy Topics Reviewed           SDOH assessments and interventions completed:  No  Care Coordination Interventions:  Yes, provided   Follow up plan:  will ask care guide to call to reschedule.    Encounter Outcome:  Pt. Visit Completed   Kathyrn Sheriff, RN, MSN, BSN, CCM [facility] Care Coordinator [phone]

## 2022-12-20 NOTE — Progress Notes (Signed)
[name]    [id number]    [birth year]  Primary Care Physician:Sagardia, Eilleen Kempf, MD Date of Appointment: 12/20/2022 Established Patient Visit  Chief complaint:   Chief Complaint  Patient presents with   Acute Visit    Cough with yellowish/brown mucous for 1.5-2 weeks.    Denies any fever, chills or body aches.  Increase in sob, breathing more rapid.    HPI: Earl Thomas is a 72 y.o. gentleman with history of RA-ILD. Intolerant of Ofev due to diarrhea. Had progression on  esbriet as well as weight loss and diarrhea. Intolerant of methotrexate. Currently on rituxumab. Has seen [facility] and is not currently transplant candidate due to weight. Has had a G tube placed to help with nutritional supplementation.  Interval Updates: Here for follow up due to worsening shortness of breath, rapid breathing. Also coughing more with yellowish mucus. No fevers or chills. Trying tube feeds to bring up weight. Up to 98 lbs today.  Here in wheelchair today. With his wife. She reports if he were to get up and walk to the door in the exam room, it would take him 15 minutes to recover.   I have reviewed the patient's family social and past medical history and updated as appropriate.   Past Medical History:  Diagnosis Date   Abnormal weight loss 12/04/2021   Allergic rhinitis 10/11/2022   Aortic atherosclerosis (HCC) 12/04/2021   Arthralgia of temporomandibular joint 12/04/2021   Arthritis    BMI 26.0-26.9,adult 10/22/2016   Chronic bronchitis (HCC) 06/17/2022   Chronic diastolic CHF (congestive heart failure) (HCC) 01/19/2022   Chronic fatigue syndrome 12/04/2021   Chronic hypoxic respiratory failure (HCC) 08/30/2022   Dyslipidemia 03/10/2012   Family history of prostate cancer 12/04/2021   GERD (gastroesophageal reflux disease) 01/19/2022   Glucose intolerance (impaired glucose tolerance)    Hemorrhage of rectum and anus 10/17/2022    Hyperlipidemia    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   Interstitial lung disease (HCC) 07/16/2019   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 (HCC) 10/17/2022   Long-term current use of high risk medication other than anticoagulant 07/16/2019   Other long term (current) drug therapy 12/04/2021   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   Screening for malignant neoplasm of colon 10/17/2022   Skin lesion of chest wall 12/04/2021    Past Surgical History:  Procedure Laterality Date   IR GASTROSTOMY TUBE MOD SED  10/03/2022   ROTATOR CUFF REPAIR Right 01/02/16    Family History  Problem Relation Age of Onset   Cancer Father 87       prostate cancer   Diabetes Father    Heart disease Father 53       AMI late 29s   Cancer Mother 59       Breast cancer   Heart disease Mother        CABG at age 34   Diabetes Brother    Heart disease Brother        AMI x 2; CABG   Hyperlipidemia Brother     Social History   Occupational History   Occupation: Professor  Tobacco Use  Smoking status: Former    Types: Pipe    Quit date: 10/06/1988    Years since quitting: 34.2    Passive exposure: Never   Smokeless tobacco: Never   Tobacco comments:    parent smoked in home as a child.  Smoked a pip in college, not daily.  Vaping Use   Vaping Use: Never used  Substance and Sexual Activity   Alcohol use: No   Drug use: No   Sexual activity: Yes    Physical Exam: Blood pressure (!) 88/68, pulse (!) 106, temperature 98.5 F (36.9 C), temperature source Oral, height 5\' 5"  (1.651 m), weight 98 lb 6.4 oz (44.6 kg), SpO2 (!) 80  %.  Gen:     Thin, in wheelchair, tachypnic.  Lungs:    bibasilar crackles, shallow respirations,  CV:     tachycardic, regular MSK:     no acute synovitis.  Ext: clubbing  Data Reviewed: Imaging: I have personally reviewed the CT scan from Nov 2020 which demonstrates bibasilar honeycombing consistent with UIP pattern. On the 2023 feb scan there is progression of disease.   Echocardiogram 1. Left ventricular ejection fraction, by estimation, is 60 to 65%. The  left ventricle has normal function. The left ventricle has no regional  wall motion abnormalities. Left ventricular diastolic parameters are  consistent with Grade I diastolic  dysfunction (impaired relaxation).   2. Right ventricular systolic function is normal. The right ventricular  size is normal. There is normal pulmonary artery systolic pressure.   3. The mitral valve is normal in structure. Mild mitral valve  regurgitation. No evidence of mitral stenosis.   4. The aortic valve is normal in structure. Aortic valve regurgitation is  not visualized. No aortic stenosis is present.   5. The inferior vena cava is normal in size with greater than 50%  respiratory variability, suggesting right atrial pressure of 3 mmHg.   PFTs:      Latest Ref Rng & Units 10/29/2021    3:00 PM 04/18/2021    3:05 PM 11/13/2020    3:07 PM 08/07/2020   10:04 AM 07/26/2019   10:56 AM  PFT Results  FVC-Pre L 1.22  1.71  1.71  1.37  1.52   FVC-Predicted Pre % 38  54  54  43  47   FVC-Post L     1.82   FVC-Predicted Post %     57   Pre FEV1/FVC % % 99  94  92  100  100   Post FEV1/FCV % %     88   FEV1-Pre L 1.21  1.61  1.57  1.37  1.52   FEV1-Predicted Pre % 50  67  65  57  63   FEV1-Post L     1.61   DLCO uncorrected ml/min/mmHg 8.80  10.08  11.16  9.68  11.79   DLCO UNC% % 39  45  49  42  52   DLCO corrected ml/min/mmHg 8.80   11.16  9.68    DLCO COR %Predicted % 39   49  42    DLVA Predicted % 55  71  104  73  82   TLC L     3.42    TLC % Predicted %     57   RV % Predicted %     84    I have personally reviewed the patient's PFTs and which show moderate restriction with an FVC of 54% which is stable from  previous PFTs.  PFTs at [facility] Jan 2024 show FVC 1.08, dlco severely reduced  Jan 2024 Walked 972 ft on 2L ( ) which is decreased from 1041 ft in August.    Labs: I have reviewed his outside hospital labs noted on March 23 2019 progress note, his CCP level was greater than 255, Rheumatoid factor was 149 his, ESR was 109 his uric acid was normal.  Lab Results  Component Value Date   NA 135 10/13/2022   K 4.6 10/13/2022   CO2 30 10/13/2022   GLUCOSE 82 10/13/2022   BUN 12 10/13/2022   CREATININE 0.93 10/13/2022   CALCIUM 8.5 (L) 10/13/2022   GFRNONAA >60 10/13/2022   Lab Results  Component Value Date   WBC 4.8 10/11/2022   HGB 11.8 (L) 10/11/2022   HCT 36.9 (L) 10/11/2022   MCV 88.5 10/11/2022   PLT 167 10/11/2022     Immunization status: Immunization History  Administered Date(s) Administered   Fluad Quad(high Dose 65+) 04/13/2020, 04/18/2021, 03/11/2022   Hep A / Hep B 02/12/2022   Influenza, High Dose Seasonal PF 04/24/2019   Influenza,inj,Quad PF,6+ Mos 05/30/2015, 04/23/2016, 05/14/2017, 03/25/2018   Influenza-Unspecified 04/07/2014   PFIZER(Purple Top)SARS-COV-2 Vaccination 07/30/2019, 08/20/2019, 04/27/2020   PNEUMOCOCCAL CONJUGATE-20 12/25/2020   Pfizer Covid-19 Vaccine Bivalent Booster 2yrs & up 04/24/2021   Pneumococcal Conjugate-13 05/18/2018   Pneumococcal Polysaccharide-23 06/09/2019   Td 03/10/2021   Tdap 02/13/2009, 05/14/2017   Zoster Recombinat (Shingrix) 05/27/2018, 01/19/2019   Zoster, Live 04/12/2014    Assessment:  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  Chronic Rhinitis - controlled Chronic respiratory failure  on 2-3LNC with inogen CAP - new symptoms Goals of Care discussion.   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 Methotrexate stopped due to adverse effects (gastrointestinal) Continue rituximab and meloxicam for RA.  Continue tube feeds.  Will treat with augmentin for community acquired pneumonia. I am concerned about progression of disease. He is agreeable to CT scan of the chest.   We had a conversation regarding goals of care. Today introduced the possibility of progression of illness to the point of hospitalization and the possible interventions offered in the setting of worsening illness. I discussed the possibility of CPR and mechanical ventilation.  I made a medical recommendation against these interventions in the setting of his progressive fibrotic ILD.  His wife reports they are people of faith and many people are praying for him.  I will support him in whatever his goals are.  At this point Mr. Petrosino is hopefully and remains full code and is hopeful for recovery and potential transplantation.     Return to Care: Return in about 3 months (around 03/22/2023).   Durel Salts, MD Pulmonary and Critical Care Medicine [facility] [phone]

## 2022-12-20 NOTE — Patient Instructions (Signed)
Please schedule follow up scheduled with myself in 2-3 months.  If my schedule is not open yet, we will contact you with a reminder closer to that time. Please call [phone] if you haven't heard from [place] a month before.   I have ordered a CT scan of your lungs - I will call you with the results once it's completed.  I am sending in augmentin for 7 days to your pharmacy to treat you for pneumonia.

## 2022-12-27 ENCOUNTER — Ambulatory Visit (HOSPITAL_BASED_OUTPATIENT_CLINIC_OR_DEPARTMENT_OTHER): Payer: Medicare Other

## 2022-12-30 ENCOUNTER — Ambulatory Visit (HOSPITAL_BASED_OUTPATIENT_CLINIC_OR_DEPARTMENT_OTHER)
Admission: RE | Admit: 2022-12-30 | Discharge: 2022-12-30 | Disposition: A | Discharge status: Home / Self Care | Payer: Medicare Other | Source: Ambulatory Visit | Attending: Internal Medicine | Admitting: Internal Medicine

## 2022-12-30 ENCOUNTER — Other Ambulatory Visit: Payer: Self-pay

## 2022-12-30 DIAGNOSIS — J189 Pneumonia, unspecified organism: Secondary | ICD-10-CM | POA: Diagnosis not present

## 2022-12-30 DIAGNOSIS — J849 Interstitial pulmonary disease, unspecified: Secondary | ICD-10-CM | POA: Insufficient documentation

## 2022-12-31 ENCOUNTER — Ambulatory Visit: Payer: Medicare Other | Admitting: Internal Medicine

## 2023-01-06 ENCOUNTER — Encounter: Payer: Self-pay | Admitting: Internal Medicine

## 2023-01-07 NOTE — Telephone Encounter (Signed)
Dr. Celine Mans- please advise on pt's question about CT. I instructed him to contact DME about his o2 question. Thanks!   Dr. Celine Mans, In your response to the CT scan report you said it showed a worsening of the lungs since last year. Has it progressed significantly or moderate? Do you think I still have a chance for getting a transplant seeing that the lung has worsened. Is it possible to get a larger Inogen that has a battery life of 12 hrs. 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 Its a [employer] 6 and would medicare approve of the upgrade? Earl Thomas 1950-09-01

## 2023-01-20 ENCOUNTER — Encounter: Payer: Self-pay | Admitting: Emergency Medicine

## 2023-01-20 ENCOUNTER — Ambulatory Visit: Payer: Medicare Other | Admitting: Emergency Medicine

## 2023-01-20 VITALS — BP 116/84 | HR 120 | Temp 97.8°F | Ht 65.0 in | Wt 100.1 lb

## 2023-01-20 DIAGNOSIS — R399 Unspecified symptoms and signs involving the genitourinary system: Secondary | ICD-10-CM

## 2023-01-20 DIAGNOSIS — J9611 Chronic respiratory failure with hypoxia: Secondary | ICD-10-CM | POA: Diagnosis not present

## 2023-01-20 DIAGNOSIS — M0510 Rheumatoid lung disease with rheumatoid arthritis of unspecified site: Secondary | ICD-10-CM | POA: Diagnosis not present

## 2023-01-20 DIAGNOSIS — E43 Unspecified severe protein-calorie malnutrition: Secondary | ICD-10-CM

## 2023-01-20 LAB — URINALYSIS
Bilirubin Urine: NEGATIVE
Hgb urine dipstick: NEGATIVE
Ketones, ur: NEGATIVE
Leukocytes,Ua: NEGATIVE
Nitrite: NEGATIVE
Specific Gravity, Urine: 1.015 (ref 1.000–1.030)
Urine Glucose: NEGATIVE
Urobilinogen, UA: 0.2 (ref 0.0–1.0)
pH: 8 (ref 5.0–8.0)

## 2023-01-20 NOTE — Assessment & Plan Note (Signed)
Of recent onset. Unremarkable urinalysis Urine sent for culture

## 2023-01-20 NOTE — Progress Notes (Signed)
Blossom Hoops 72 y.o.   Chief Complaint  Patient presents with   Medical Management of Chronic Issues    f/u appt, SOB getting worse, patient states his urine is very strong and dark looking, Patient is urinating more frequently     HISTORY OF PRESENT ILLNESS: This is a 72 y.o. 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  Feeding tube in place On testosterone supplementation.  Appetite has improved. Chronic care management team as well as palliative care team has been to patient's home recently. Notes reviewed today. Today complaining of urinary frequency and strong smell.  Dark looking urine. 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  Chronic Rhinitis - controlled Chronic respiratory failure on 2-3LNC with inogen CAP - new symptoms Goals of Care discussion.    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 Methotrexate stopped due to adverse effects (gastrointestinal) Continue rituximab and meloxicam for RA.  Continue tube feeds.   Will treat with augmentin for community acquired pneumonia. I am concerned about progression of disease. He is agreeable to CT scan of the chest.    We had a conversation regarding goals of care. Today introduced the possibility of progression of illness to the point of hospitalization and the possible interventions offered in the setting of worsening illness. I discussed the  possibility of CPR and mechanical ventilation.  I made a medical recommendation against these interventions in the setting of his progressive fibrotic ILD.  His wife reports they are people of faith and many people are praying for him.  I will support him in whatever his goals are.  At this point Mr. Volkert is hopefully and remains full code and is hopeful for recovery and potential transplantation.        Return to Care: Return in about 3 months (around 03/22/2023).     Durel Salts, MD Pulmonary and Critical Care Medicine [facility] Office:[phone]  HPI   Prior to Admission medications   Medication Sig Start Date End Date Taking? Authorizing Provider  acetaminophen (TYLENOL) 500 MG tablet Take 1,000 mg by mouth every 6 (six) hours as needed for headache (pain).   Yes [provider]  albuterol (VENTOLIN HFA) 108 (90 Base) MCG/ACT inhaler Inhale 1-2 puffs into the lungs every 6 (six) hours as needed for wheezing or shortness of breath. 11/07/22  Yes Charlott Holler, MD  ascorbic acid (VITAMIN C) 500 MG tablet Take 500 mg by mouth daily.   Yes [provider]  aspirin EC 81 MG tablet Take 81 mg by mouth every morning.   Yes [provider]  atorvastatin (LIPITOR) 10 MG tablet Take 1 tablet (10 mg total) by mouth daily. 04/30/22  Yes Keundra Petrucelli, Eilleen Kempf, MD  azelastine (ASTELIN) 0.1 % nasal spray Place 2 sprays into both nostrils 2 (two) times daily. Use in each nostril as directed Patient taking differently: Place 2 sprays into both nostrils 2 (two) times daily as needed for  rhinitis or allergies (congestion). Use in each nostril as directed 12/25/21  Yes Ferol Luz, MD  Brimonidine Tartrate (LUMIFY) 0.025 % SOLN Place 1 drop into both eyes every Sunday.   Yes [provider]  cetirizine (ZYRTEC) 10 MG tablet Take 10 mg by mouth daily.   Yes [provider]  dronabinol (MARINOL) 2.5 MG capsule Take 1 capsule (2.5 mg total) by  mouth 2 (two) times daily before a meal. 04/18/22  Yes Charlott Holler, MD  famotidine (PEPCID) 20 MG tablet Take 1 tablet (20 mg total) by mouth 2 (two) times daily. 04/30/22  Yes Afra Tricarico, Eilleen Kempf, MD  folic acid ([name]) 1 MG tablet Take 1 mg by mouth every morning. 08/27/19  Yes [provider]  guaifenesin (ROBITUSSIN) 100 MG/5ML syrup Take 200 mg by mouth 3 (three) times daily as needed for cough or congestion.   Yes [provider]  HYDROcodone-acetaminophen (NORCO) 5-325 MG tablet Take 1 tablet by mouth every 6 (six) hours as needed for moderate pain. 10/03/22  Yes Bruning, Caryn Bee, PA-C  hydrocortisone (PROCTOZONE-HC) 2.5 % rectal cream Place 1 Application rectally 2 (two) times daily. 05/08/22  Yes Tessy Pawelski, Eilleen Kempf, MD  levocetirizine (XYZAL) 5 MG tablet TAKE 1 TABLET BY MOUTH ONCE DAILY IN THE EVENING 07/09/22  Yes Ferol Luz, MD  loperamide (IMODIUM) 2 MG capsule Take 1 capsule (2 mg total) by mouth 4 (four) times daily as needed for diarrhea or loose stools. 06/07/22  Yes Long, Arlyss Repress, MD  meloxicam (MOBIC) 7.5 MG tablet Take 1 tablet (7.5 mg total) by mouth daily as needed for pain. 06/04/22  Yes Parrett, Tammy S, NP  Naphazoline HCl (CLEAR EYES OP) Place 1 drop into both eyes daily.   Yes [provider]  nitroGLYCERIN (NITROSTAT) 0.4 MG SL tablet Place 0.4 mg under the tongue every 5 (five) minutes as needed for chest pain.   Yes [provider]  ondansetron (ZOFRAN-ODT) 4 MG disintegrating tablet Take 1 tablet (4 mg total) by mouth every 8 (eight) hours as needed. 06/07/22  Yes Long, Arlyss Repress, MD  riTUXimab (RITUXAN IV) Inject into the vein.   Yes [provider]  Testosterone Enanthate (XYOSTED) 75 MG/0.5ML SOAJ Inject 75 mg into the skin once a week. 10/21/22  Yes Georgina Quint, MD    No Known Allergies  Patient Active Problem List   Diagnosis Date Noted   Hemorrhage of rectum and anus 10/17/2022   Personal history  of colonic polyps 10/17/2022   Screening for malignant neoplasm of colon 10/17/2022   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 (HCC) 10/17/2022   Rheumatoid arthritis (HCC) 10/17/2022   Allergic rhinitis 10/11/2022   Chronic hypoxic respiratory failure (HCC) 08/30/2022   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   Chronic bronchitis (HCC) 06/17/2022   Protein-calorie malnutrition, severe (HCC) 06/17/2022   GERD (gastroesophageal reflux disease) 01/19/2022   Chronic diastolic CHF (congestive heart failure) (HCC) 01/19/2022   Arthritis 12/25/2021   Hyperlipidemia 12/25/2021   Chronic fatigue syndrome 12/04/2021   Other long term (current) drug therapy 12/04/2021   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   Arthralgia of temporomandibular joint 12/04/2021   Abnormal weight loss 12/04/2021   Family history of prostate cancer 12/04/2021   Aortic atherosclerosis (HCC) 12/04/2021   Skin lesion of chest wall 12/04/2021   Interstitial lung disease (HCC) 07/16/2019   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) 07/16/2019   Long-term current use of high risk medication other than  anticoagulant 07/16/2019   BMI 26.0-26.9,adult 10/22/2016   Renal insufficiency 04/29/2016   Glucose intolerance (impaired glucose tolerance) 04/29/2016   Dyslipidemia 03/10/2012   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    Past Medical History:  Diagnosis Date   Abnormal weight loss 12/04/2021   Allergic rhinitis 10/11/2022   Aortic atherosclerosis (HCC) 12/04/2021   Arthralgia of temporomandibular joint 12/04/2021   Arthritis    BMI 26.0-26.9,adult 10/22/2016   Chronic bronchitis (HCC) 06/17/2022   Chronic diastolic CHF (congestive heart failure) (HCC) 01/19/2022   Chronic fatigue syndrome 12/04/2021   Chronic hypoxic respiratory failure (HCC) 08/30/2022   Dyslipidemia 03/10/2012   Family history of prostate cancer 12/04/2021   GERD  (gastroesophageal reflux disease) 01/19/2022   Glucose intolerance (impaired glucose tolerance)    Hemorrhage of rectum and anus 10/17/2022   Hyperlipidemia    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   Interstitial lung disease (HCC) 07/16/2019   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 (HCC) 10/17/2022   Long-term current use of high risk medication other than anticoagulant 07/16/2019   Other long term (current) drug therapy 12/04/2021   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   Screening for malignant neoplasm of colon 10/17/2022   Skin lesion of chest wall 12/04/2021    Past Surgical History:  Procedure Laterality Date   IR GASTROSTOMY TUBE MOD SED  10/03/2022   ROTATOR CUFF REPAIR Right 01/02/16    Social History   Socioeconomic History   Marital status: Married    Spouse name: Not on file   Number of children: 2   Years of education: Not on file   Highest education level: Not on file  Occupational History   Occupation: Professor  Tobacco Use   Smoking status: Former    Types: Pipe    Quit date: 10/06/1988    Years since quitting: 34.3    Passive exposure: Never   Smokeless tobacco: Never   Tobacco comments:    parent smoked in home as a child.  Smoked a pip in college, not daily.  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      Tobacco: pipe in 1980s      Alcohol: none      Exercise: joined [employer]; going 2-3 times per week.        Seatbelt:  100%   Social Determinants of Health   Financial Resource Strain: Low Risk  (12/09/2022)   Overall Financial Resource Strain (CARDIA)    Difficulty of Paying Living Expenses: Not hard at all  Food Insecurity: No Food Insecurity (12/09/2022)   Hunger Vital Sign    Worried About Running Out of Food in the Last Year: Never true    Ran Out  of Food in the Last Year: Never true  Transportation Needs: No Transportation Needs (12/09/2022)   PRAPARE - Administrator, Civil Service (Medical): No    Lack of Transportation (Non-Medical): No  Physical Activity: Inactive (12/09/2022)   Exercise Vital Sign    Days of Exercise per Week: 0 days    Minutes of Exercise per Session: 0 min  Stress: No Stress Concern Present (12/09/2022)   [employer] of Occupational Health - Occupational Stress Questionnaire    Feeling of Stress : Not at all  Social Connections: Socially Integrated (12/09/2022)   Social Connection and Isolation Panel [NHANES]    Frequency of Communication with Friends and Family: More than three times a week    Frequency of Social Gatherings with Friends and Family: More than three times a week    Attends Religious Services: More than 4 times per year    Active Member of [employer] or Organizations: Yes    Attends Engineer, structural: More than 4 times per year    Marital Status: Married  Catering manager Violence: Not At Risk (12/09/2022)   Humiliation, Afraid, Rape, and Kick questionnaire    Fear of Current or Ex-Partner: No    Emotionally Abused: No    Physically Abused: No    Sexually Abused: No    Family History  Problem Relation Age of Onset   Cancer Father 33       prostate cancer    Diabetes Father    Heart disease Father 3       AMI late 2s   Cancer Mother 22       Breast cancer   Heart disease Mother        CABG at age 53   Diabetes Brother    Heart disease Brother        AMI x 2; CABG   Hyperlipidemia Brother      Review of Systems  Constitutional: Negative.  Negative for chills and fever.  HENT: Negative.  Negative for congestion and sore throat.   Respiratory:  Positive for shortness of breath.   Cardiovascular: Negative.  Negative for chest pain and palpitations.  Gastrointestinal:  Negative for abdominal pain, diarrhea, nausea and vomiting.  Genitourinary:  Positive for frequency.  Skin: Negative.  Negative for rash.  Neurological: Negative.  Negative for dizziness and headaches.  All other systems reviewed and are negative.   Vitals:   01/20/23 1014  BP: 116/84  Pulse: (!) 120  Temp: 97.8 F (36.6 C)  SpO2: (!) 82%    Physical Exam Vitals reviewed.  Constitutional:      Comments: Chronically ill thin looking  HENT:     Head: Normocephalic.     Mouth/Throat:     Mouth: Mucous membranes are moist.     Pharynx: Oropharynx is clear.  Eyes:     Extraocular Movements: Extraocular movements intact.  Cardiovascular:     Rate and Rhythm: Normal rate and regular rhythm.     Pulses: Normal pulses.     Heart sounds: Normal heart sounds.  Pulmonary:     Effort: Pulmonary effort is normal.     Breath sounds: Normal breath sounds.  Abdominal:     Palpations: Abdomen is soft.     Tenderness: There is no abdominal tenderness.  Musculoskeletal:     Cervical back: No tenderness.  Lymphadenopathy:     Cervical: No cervical adenopathy.  Skin:    General: Skin is warm and dry.  Capillary Refill: Capillary refill takes less than 2 seconds.  Neurological:     Mental Status: He is alert and oriented to person, place, and time.  Psychiatric:        Mood and Affect: Mood normal.        Behavior: Behavior normal.    Results for orders  placed or performed in visit on 01/20/23 (from the past 24 hour(s))  Urinalysis     Status: Abnormal   Collection Time: 01/20/23 10:47 AM  Result Value Ref Range   Color, Urine YELLOW Yellow;Lt. Yellow;Straw;Dark Yellow;Amber;Green;Red;Brown   APPearance CLEAR Clear;Turbid;Slightly Cloudy;Cloudy   Specific Gravity, Urine 1.015 1.000 - 1.030   pH 8.0 5.0 - 8.0   Total Protein, Urine TRACE (A) Negative   Urine Glucose NEGATIVE Negative   Ketones, ur NEGATIVE Negative   Bilirubin Urine NEGATIVE Negative   Hgb urine dipstick NEGATIVE Negative   Urobilinogen, UA 0.2 0.0 - 1.0   Leukocytes,Ua NEGATIVE Negative   Nitrite NEGATIVE Negative     ASSESSMENT & PLAN: A total of 43 minutes was spent with the patient and counseling/coordination of care regarding preparing for this visit, review of most recent office visit notes, review of most recent pulmonary doctors visit note, review of multiple chronic medical conditions under management, review of all medications, education on nutrition, review of most recent blood work results, prognosis, documentation, and need for follow-up.  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 Unremarkable urinalysis Urine sent for culture      Relevant Orders   Urinalysis (Completed)   Urine Culture   Patient Instructions  Urinary Frequency, Adult Urinary  frequency means urinating more often than usual. You may urinate every 1-2 hours even though you drink a normal amount of fluid and do not have a bladder infection or condition. Although you urinate more often than normal, the total amount of urine produced in a day is normal. With urinary frequency, you may have an urgent need to urinate often. The stress and anxiety of needing to find a bathroom quickly can make this urge worse. This condition may go away on its own, or you may need treatment at home. Home treatment may include bladder training, exercises, taking medicines, or making changes to your diet. Follow these instructions at home: Bladder health Your health care provider will tell you what to do to improve bladder health. You may be told to: Keep a bladder diary. Keep track of: What you eat and drink. How often you urinate. How much you urinate. Follow a bladder training program. This may include: Learning to delay going to the bathroom. Double urinating, also called voiding. This helps if you are not completely emptying your bladder. Scheduled voiding. Do Kegel exercises. Kegel exercises strengthen the muscles that help control urination, which may help the condition.  Eating and drinking Follow instructions  from your health care provider about eating or drinking restrictions. You may be told to: Avoid caffeine. Drink fewer fluids, especially alcohol. Avoid drinking in the evening. Avoid foods or drinks that may irritate the bladder. These include coffee, tea, soda, artificial sweeteners, citrus, tomato-based foods, and chocolate. Eat foods that help prevent or treat constipation. Constipation can make urinary frequency worse. You may need to take these actions to prevent or treat constipation: Drink enough fluid to keep your urine pale yellow. Take over-the-counter or prescription medicines. Eat foods that are high in fiber, such as beans, whole grains, and fresh fruits and  vegetables. Limit foods that are high in fat and processed sugars, such as fried or sweet foods. General instructions Take over-the-counter and prescription medicines only as told by your health care provider. Keep all follow-up visits. This is important. Contact a health care provider if: You start urinating more often. You feel pain or irritation when you urinate. You notice blood in your urine. Your urine looks cloudy. You develop a fever. You begin vomiting. Get help right away if: You are unable to urinate. Summary Urinary frequency means urinating more often than usual. With urinary frequency, you may urinate every 1-2 hours even though you drink a normal amount of fluid and do not have a bladder infection or other bladder condition. Your health care provider may recommend that you keep a bladder diary, follow a bladder training program, or make dietary changes. If told by your health care provider, do Kegel exercises to strengthen the muscles that help control urination. Take over-the-counter and prescription medicines only as told by your health care provider. Contact a health care provider if your symptoms do not improve or get worse. This information is not intended to replace advice given to you by your health care provider. Make sure you discuss any questions you have with your health care provider. Document Revised: 01/28/2020 Document Reviewed: 01/28/2020 Elsevier Patient Education  2024 Elsevier Inc.    Edwina Barth, MD [facility]

## 2023-01-20 NOTE — Assessment & Plan Note (Signed)
Continues feeding both oral and using G-tube Continues testosterone supplementation and Marinol 2.5 mg twice a day

## 2023-01-20 NOTE — Patient Instructions (Signed)
Urinary Frequency, Adult Urinary frequency means urinating more often than usual. You may urinate every 1-2 hours even though you drink a normal amount of fluid and do not have a bladder infection or condition. Although you urinate more often than normal, the total amount of urine produced in a day is normal. With urinary frequency, you may have an urgent need to urinate often. The stress and anxiety of needing to find a bathroom quickly can make this urge worse. This condition may go away on its own, or you may need treatment at home. Home treatment may include bladder training, exercises, taking medicines, or making changes to your diet. Follow these instructions at home: Bladder health Your health care provider will tell you what to do to improve bladder health. You may be told to: Keep a bladder diary. Keep track of: What you eat and drink. How often you urinate. How much you urinate. Follow a bladder training program. This may include: Learning to delay going to the bathroom. Double urinating, also called voiding. This helps if you are not completely emptying your bladder. Scheduled voiding. Do Kegel exercises. Kegel exercises strengthen the muscles that help control urination, which may help the condition.  Eating and drinking Follow instructions from your health care provider about eating or drinking restrictions. You may be told to: Avoid caffeine. Drink fewer fluids, especially alcohol. Avoid drinking in the evening. Avoid foods or drinks that may irritate the bladder. These include coffee, tea, soda, artificial sweeteners, citrus, tomato-based foods, and chocolate. Eat foods that help prevent or treat constipation. Constipation can make urinary frequency worse. You may need to take these actions to prevent or treat constipation: Drink enough fluid to keep your urine pale yellow. Take over-the-counter or prescription medicines. Eat foods that are high in fiber, such as beans, whole  grains, and fresh fruits and vegetables. Limit foods that are high in fat and processed sugars, such as fried or sweet foods. General instructions Take over-the-counter and prescription medicines only as told by your health care provider. Keep all follow-up visits. This is important. Contact a health care provider if: You start urinating more often. You feel pain or irritation when you urinate. You notice blood in your urine. Your urine looks cloudy. You develop a fever. You begin vomiting. Get help right away if: You are unable to urinate. Summary Urinary frequency means urinating more often than usual. With urinary frequency, you may urinate every 1-2 hours even though you drink a normal amount of fluid and do not have a bladder infection or other bladder condition. Your health care provider may recommend that you keep a bladder diary, follow a bladder training program, or make dietary changes. If told by your health care provider, do Kegel exercises to strengthen the muscles that help control urination. Take over-the-counter and prescription medicines only as told by your health care provider. Contact a health care provider if your symptoms do not improve or get worse. This information is not intended to replace advice given to you by your health care provider. Make sure you discuss any questions you have with your health care provider. Document Revised: 01/28/2020 Document Reviewed: 01/28/2020 Elsevier Patient Education  2024 Elsevier Inc.  

## 2023-01-20 NOTE — Assessment & Plan Note (Signed)
Disease continues to progress despite treatment and oxygen Continues treatment with rituximab and meloxicam Intolerant to methotrexate Still being considered for lung transplant by [facility]

## 2023-01-20 NOTE — Assessment & Plan Note (Signed)
Continues oxygen 3 L/min

## 2023-01-21 LAB — URINE CULTURE: Result:: NO GROWTH

## 2023-01-23 ENCOUNTER — Encounter: Payer: Self-pay | Admitting: Emergency Medicine

## 2023-01-23 ENCOUNTER — Other Ambulatory Visit: Payer: Self-pay | Admitting: Emergency Medicine

## 2023-01-23 DIAGNOSIS — N3281 Overactive bladder: Secondary | ICD-10-CM

## 2023-01-23 DIAGNOSIS — R399 Unspecified symptoms and signs involving the genitourinary system: Secondary | ICD-10-CM

## 2023-01-23 MED ORDER — GEMTESA 75 MG PO TABS
75.0000 mg | ORAL_TABLET | Freq: Every day | ORAL | 3 refills | Status: DC
Start: 2023-01-23 — End: 2023-06-03

## 2023-01-23 NOTE — Telephone Encounter (Signed)
Correct.  Urine culture came back negative.  No need for antibiotics.  Overactive bladder can be treated with medications.  Recommend Gemtesa 75 mg daily.  I will send a new prescription today to pharmacy of record.  Thanks.

## 2023-02-04 ENCOUNTER — Emergency Department (HOSPITAL_COMMUNITY): Payer: Medicare Other

## 2023-02-04 ENCOUNTER — Telehealth: Payer: Self-pay | Admitting: Emergency Medicine

## 2023-02-04 ENCOUNTER — Other Ambulatory Visit: Payer: Self-pay

## 2023-02-04 ENCOUNTER — Inpatient Hospital Stay (HOSPITAL_COMMUNITY)
Admission: EM | Admit: 2023-02-04 | Discharge: 2023-02-07 | DRG: 177 | Disposition: A | Discharge status: Other Acute Inpatient Hospital | Payer: Medicare Other | Attending: Emergency Medicine | Admitting: Emergency Medicine

## 2023-02-04 DIAGNOSIS — R6520 Severe sepsis without septic shock: Secondary | ICD-10-CM | POA: Diagnosis not present

## 2023-02-04 DIAGNOSIS — Z803 Family history of malignant neoplasm of breast: Secondary | ICD-10-CM | POA: Diagnosis not present

## 2023-02-04 DIAGNOSIS — Z8042 Family history of malignant neoplasm of prostate: Secondary | ICD-10-CM

## 2023-02-04 DIAGNOSIS — Z8719 Personal history of other diseases of the digestive system: Secondary | ICD-10-CM

## 2023-02-04 DIAGNOSIS — J9611 Chronic respiratory failure with hypoxia: Secondary | ICD-10-CM | POA: Diagnosis present

## 2023-02-04 DIAGNOSIS — E785 Hyperlipidemia, unspecified: Secondary | ICD-10-CM | POA: Diagnosis present

## 2023-02-04 DIAGNOSIS — J9621 Acute and chronic respiratory failure with hypoxia: Secondary | ICD-10-CM | POA: Diagnosis present

## 2023-02-04 DIAGNOSIS — Z681 Body mass index (BMI) 19 or less, adult: Secondary | ICD-10-CM | POA: Diagnosis not present

## 2023-02-04 DIAGNOSIS — I5032 Chronic diastolic (congestive) heart failure: Secondary | ICD-10-CM | POA: Diagnosis not present

## 2023-02-04 DIAGNOSIS — J84112 Idiopathic pulmonary fibrosis: Secondary | ICD-10-CM | POA: Diagnosis not present

## 2023-02-04 DIAGNOSIS — J42 Unspecified chronic bronchitis: Secondary | ICD-10-CM | POA: Diagnosis present

## 2023-02-04 DIAGNOSIS — E8729 Other acidosis: Secondary | ICD-10-CM | POA: Diagnosis not present

## 2023-02-04 DIAGNOSIS — Z8349 Family history of other endocrine, nutritional and metabolic diseases: Secondary | ICD-10-CM

## 2023-02-04 DIAGNOSIS — Z87891 Personal history of nicotine dependence: Secondary | ICD-10-CM

## 2023-02-04 DIAGNOSIS — Z8701 Personal history of pneumonia (recurrent): Secondary | ICD-10-CM | POA: Diagnosis not present

## 2023-02-04 DIAGNOSIS — R0602 Shortness of breath: Secondary | ICD-10-CM | POA: Diagnosis not present

## 2023-02-04 DIAGNOSIS — U071 COVID-19: Secondary | ICD-10-CM | POA: Diagnosis present

## 2023-02-04 DIAGNOSIS — D849 Immunodeficiency, unspecified: Secondary | ICD-10-CM | POA: Diagnosis present

## 2023-02-04 DIAGNOSIS — R000 Tachycardia, unspecified: Secondary | ICD-10-CM | POA: Diagnosis not present

## 2023-02-04 DIAGNOSIS — J9622 Acute and chronic respiratory failure with hypercapnia: Secondary | ICD-10-CM | POA: Diagnosis present

## 2023-02-04 DIAGNOSIS — Z833 Family history of diabetes mellitus: Secondary | ICD-10-CM | POA: Diagnosis not present

## 2023-02-04 DIAGNOSIS — J849 Interstitial pulmonary disease, unspecified: Secondary | ICD-10-CM | POA: Diagnosis present

## 2023-02-04 DIAGNOSIS — R54 Age-related physical debility: Secondary | ICD-10-CM | POA: Diagnosis not present

## 2023-02-04 DIAGNOSIS — R0902 Hypoxemia: Secondary | ICD-10-CM | POA: Diagnosis not present

## 2023-02-04 DIAGNOSIS — E43 Unspecified severe protein-calorie malnutrition: Secondary | ICD-10-CM | POA: Diagnosis present

## 2023-02-04 DIAGNOSIS — Z515 Encounter for palliative care: Secondary | ICD-10-CM | POA: Diagnosis not present

## 2023-02-04 DIAGNOSIS — R609 Edema, unspecified: Secondary | ICD-10-CM | POA: Diagnosis not present

## 2023-02-04 DIAGNOSIS — Z931 Gastrostomy status: Secondary | ICD-10-CM

## 2023-02-04 DIAGNOSIS — J1282 Pneumonia due to coronavirus disease 2019: Secondary | ICD-10-CM | POA: Diagnosis present

## 2023-02-04 DIAGNOSIS — R0603 Acute respiratory distress: Secondary | ICD-10-CM | POA: Diagnosis present

## 2023-02-04 DIAGNOSIS — M0510 Rheumatoid lung disease with rheumatoid arthritis of unspecified site: Secondary | ICD-10-CM | POA: Diagnosis present

## 2023-02-04 DIAGNOSIS — Z8249 Family history of ischemic heart disease and other diseases of the circulatory system: Secondary | ICD-10-CM | POA: Diagnosis not present

## 2023-02-04 DIAGNOSIS — R252 Cramp and spasm: Secondary | ICD-10-CM | POA: Diagnosis not present

## 2023-02-04 DIAGNOSIS — Z7189 Other specified counseling: Secondary | ICD-10-CM | POA: Diagnosis not present

## 2023-02-04 DIAGNOSIS — R110 Nausea: Secondary | ICD-10-CM | POA: Diagnosis not present

## 2023-02-04 DIAGNOSIS — R0689 Other abnormalities of breathing: Secondary | ICD-10-CM | POA: Diagnosis not present

## 2023-02-04 DIAGNOSIS — R64 Cachexia: Secondary | ICD-10-CM | POA: Diagnosis present

## 2023-02-04 DIAGNOSIS — Z7982 Long term (current) use of aspirin: Secondary | ICD-10-CM | POA: Diagnosis not present

## 2023-02-04 DIAGNOSIS — I959 Hypotension, unspecified: Secondary | ICD-10-CM | POA: Diagnosis not present

## 2023-02-04 DIAGNOSIS — Z66 Do not resuscitate: Secondary | ICD-10-CM | POA: Diagnosis not present

## 2023-02-04 DIAGNOSIS — Z711 Person with feared health complaint in whom no diagnosis is made: Secondary | ICD-10-CM | POA: Diagnosis not present

## 2023-02-04 DIAGNOSIS — J9601 Acute respiratory failure with hypoxia: Secondary | ICD-10-CM | POA: Diagnosis not present

## 2023-02-04 LAB — I-STAT CHEM 8, ED
BUN: 19 mg/dL (ref 8–23)
Calcium, Ion: 1.04 mmol/L — ABNORMAL LOW (ref 1.15–1.40)
Chloride: 99 mmol/L (ref 98–111)
Creatinine, Ser: 1.3 mg/dL — ABNORMAL HIGH (ref 0.61–1.24)
Glucose, Bld: 77 mg/dL (ref 70–99)
HCT: 62 % — ABNORMAL HIGH (ref 39.0–52.0)
Hemoglobin: 21.1 g/dL (ref 13.0–17.0)
Potassium: 5.7 mmol/L — ABNORMAL HIGH (ref 3.5–5.1)
Sodium: 138 mmol/L (ref 135–145)
TCO2: 31 mmol/L (ref 22–32)

## 2023-02-04 LAB — LACTIC ACID, PLASMA
Lactic Acid, Venous: 1.9 mmol/L (ref 0.5–1.9)
Lactic Acid, Venous: 2.8 mmol/L (ref 0.5–1.9)

## 2023-02-04 LAB — COMPREHENSIVE METABOLIC PANEL
ALT: 33 U/L (ref 0–44)
AST: 41 U/L (ref 15–41)
Albumin: 3.6 g/dL (ref 3.5–5.0)
Alkaline Phosphatase: 66 U/L (ref 38–126)
Anion gap: 11 (ref 5–15)
BUN: 15 mg/dL (ref 8–23)
CO2: 27 mmol/L (ref 22–32)
Calcium: 8.6 mg/dL — ABNORMAL LOW (ref 8.9–10.3)
Chloride: 98 mmol/L (ref 98–111)
Creatinine, Ser: 1.38 mg/dL — ABNORMAL HIGH (ref 0.61–1.24)
GFR, Estimated: 55 mL/min — ABNORMAL LOW (ref >60)
Glucose, Bld: 80 mg/dL (ref 70–99)
Potassium: 5.8 mmol/L — ABNORMAL HIGH (ref 3.5–5.1)
Sodium: 136 mmol/L (ref 135–145)
Total Bilirubin: 1.2 mg/dL (ref 0.3–1.2)
Total Protein: 7 g/dL (ref 6.5–8.1)

## 2023-02-04 LAB — I-STAT ARTERIAL BLOOD GAS, ED
Acid-Base Excess: 1 mmol/L (ref 0.0–2.0)
Bicarbonate: 28.7 mmol/L — ABNORMAL HIGH (ref 20.0–28.0)
Calcium, Ion: 1.18 mmol/L (ref 1.15–1.40)
HCT: 54 % — ABNORMAL HIGH (ref 39.0–52.0)
Hemoglobin: 18.4 g/dL — ABNORMAL HIGH (ref 13.0–17.0)
O2 Saturation: 93 %
Patient temperature: 99.9
Potassium: 4.8 mmol/L (ref 3.5–5.1)
Sodium: 140 mmol/L (ref 135–145)
TCO2: 30 mmol/L (ref 22–32)
pCO2 arterial: 55.7 mmHg — ABNORMAL HIGH (ref 32–48)
pH, Arterial: 7.323 — ABNORMAL LOW (ref 7.35–7.45)
pO2, Arterial: 78 mmHg — ABNORMAL LOW (ref 83–108)

## 2023-02-04 LAB — RESPIRATORY PANEL BY PCR

## 2023-02-04 LAB — CBC WITH DIFFERENTIAL/PLATELET
Abs Immature Granulocytes: 0.01 10*3/uL (ref 0.00–0.07)
Basophils Absolute: 0 10*3/uL (ref 0.0–0.1)
Basophils Relative: 1 %
Eosinophils Absolute: 0 10*3/uL (ref 0.0–0.5)
Eosinophils Relative: 1 %
HCT: 59.6 % — ABNORMAL HIGH (ref 39.0–52.0)
Hemoglobin: 18 g/dL — ABNORMAL HIGH (ref 13.0–17.0)
Immature Granulocytes: 0 %
Lymphocytes Relative: 8 %
Lymphs Abs: 0.4 10*3/uL — ABNORMAL LOW (ref 0.7–4.0)
MCH: 26.5 pg (ref 26.0–34.0)
MCHC: 30.2 g/dL (ref 30.0–36.0)
MCV: 87.6 fL (ref 80.0–100.0)
Monocytes Absolute: 0.5 10*3/uL (ref 0.1–1.0)
Monocytes Relative: 10 %
Neutro Abs: 3.9 10*3/uL (ref 1.7–7.7)
Neutrophils Relative %: 80 %
Platelets: 133 10*3/uL — ABNORMAL LOW (ref 150–400)
RBC: 6.8 MIL/uL — ABNORMAL HIGH (ref 4.22–5.81)
RDW: 16.8 % — ABNORMAL HIGH (ref 11.5–15.5)
WBC: 4.9 10*3/uL (ref 4.0–10.5)
nRBC: 0 % (ref 0.0–0.2)

## 2023-02-04 LAB — TROPONIN I (HIGH SENSITIVITY)
Troponin I (High Sensitivity): 108 ng/L (ref <18)
Troponin I (High Sensitivity): 112 ng/L (ref <18)

## 2023-02-04 LAB — PROCALCITONIN: Procalcitonin: 0.16 ng/mL

## 2023-02-04 LAB — CREATININE, SERUM
Creatinine, Ser: 1.3 mg/dL — ABNORMAL HIGH (ref 0.61–1.24)
GFR, Estimated: 59 mL/min — ABNORMAL LOW (ref >60)

## 2023-02-04 LAB — CBC
HCT: 57.9 % — ABNORMAL HIGH (ref 39.0–52.0)
Hemoglobin: 17.9 g/dL — ABNORMAL HIGH (ref 13.0–17.0)
MCH: 26.7 pg (ref 26.0–34.0)
MCHC: 30.9 g/dL (ref 30.0–36.0)
MCV: 86.4 fL (ref 80.0–100.0)
Platelets: 106 10*3/uL — ABNORMAL LOW (ref 150–400)
RBC: 6.7 MIL/uL — ABNORMAL HIGH (ref 4.22–5.81)
RDW: 16.4 % — ABNORMAL HIGH (ref 11.5–15.5)
WBC: 5.7 10*3/uL (ref 4.0–10.5)
nRBC: 0 % (ref 0.0–0.2)

## 2023-02-04 LAB — SARS CORONAVIRUS 2 BY RT PCR: SARS Coronavirus 2 by RT PCR: POSITIVE — AB

## 2023-02-04 LAB — HIV ANTIBODY (ROUTINE TESTING W REFLEX): HIV Screen 4th Generation wRfx: NONREACTIVE

## 2023-02-04 LAB — I-STAT CG4 LACTIC ACID, ED: Lactic Acid, Venous: 2.4 mmol/L (ref 0.5–1.9)

## 2023-02-04 MED ORDER — IPRATROPIUM-ALBUTEROL 0.5-2.5 (3) MG/3ML IN SOLN
3.0000 mL | RESPIRATORY_TRACT | Status: DC | PRN
  Administered 2023-02-06: 3 mL via RESPIRATORY_TRACT
  Filled 2023-02-04 (×2): qty 3

## 2023-02-04 MED ORDER — ORAL CARE MOUTH RINSE
15.0000 mL | OROMUCOSAL | Status: DC
  Administered 2023-02-05 – 2023-02-07 (×6): 15 mL via OROMUCOSAL

## 2023-02-04 MED ORDER — LACTATED RINGERS IV BOLUS
1000.0000 mL | Freq: Once | INTRAVENOUS | Status: AC
  Administered 2023-02-04: 1000 mL via INTRAVENOUS

## 2023-02-04 MED ORDER — CALCIUM GLUCONATE-NACL 2-0.675 GM/100ML-% IV SOLN
2.0000 g | Freq: Once | INTRAVENOUS | Status: AC
  Administered 2023-02-05: 2000 mg via INTRAVENOUS
  Filled 2023-02-04: qty 100

## 2023-02-04 MED ORDER — CHLORHEXIDINE GLUCONATE CLOTH 2 % EX PADS
6.0000 | MEDICATED_PAD | Freq: Every day | CUTANEOUS | Status: DC
  Administered 2023-02-05 – 2023-02-07 (×4): 6 via TOPICAL

## 2023-02-04 MED ORDER — HEPARIN SODIUM (PORCINE) 5000 UNIT/ML IJ SOLN
5000.0000 [IU] | Freq: Three times a day (TID) | INTRAMUSCULAR | Status: DC
  Administered 2023-02-04 – 2023-02-07 (×9): 5000 [IU] via SUBCUTANEOUS
  Filled 2023-02-04 (×9): qty 1

## 2023-02-04 MED ORDER — ARFORMOTEROL TARTRATE 15 MCG/2ML IN NEBU
15.0000 ug | INHALATION_SOLUTION | Freq: Two times a day (BID) | RESPIRATORY_TRACT | Status: DC
  Administered 2023-02-04 – 2023-02-07 (×6): 15 ug via RESPIRATORY_TRACT
  Filled 2023-02-04 (×6): qty 2

## 2023-02-04 MED ORDER — ORAL CARE MOUTH RINSE: 15.0000 mL | OROMUCOSAL | Status: DC | PRN

## 2023-02-04 MED ORDER — BUDESONIDE 0.25 MG/2ML IN SUSP
0.2500 mg | Freq: Two times a day (BID) | RESPIRATORY_TRACT | Status: DC
  Administered 2023-02-04 – 2023-02-07 (×6): 0.25 mg via RESPIRATORY_TRACT
  Filled 2023-02-04 (×6): qty 2

## 2023-02-04 MED ORDER — POLYETHYLENE GLYCOL 3350 17 G PO PACK: 17.0000 g | PACK | Freq: Every day | ORAL | Status: DC | PRN

## 2023-02-04 MED ORDER — TOCILIZUMAB 400 MG/20ML IV SOLN
8.0000 mg/kg | Freq: Once | INTRAVENOUS | Status: AC
  Administered 2023-02-04: 360 mg via INTRAVENOUS
  Filled 2023-02-04: qty 18

## 2023-02-04 MED ORDER — DOCUSATE SODIUM 100 MG PO CAPS: 100.0000 mg | ORAL_CAPSULE | Freq: Two times a day (BID) | ORAL | Status: DC | PRN

## 2023-02-04 MED ORDER — VANCOMYCIN HCL IN DEXTROSE 1-5 GM/200ML-% IV SOLN
1000.0000 mg | Freq: Once | INTRAVENOUS | Status: AC
  Administered 2023-02-04: 1000 mg via INTRAVENOUS
  Filled 2023-02-04: qty 200

## 2023-02-04 MED ORDER — DEXAMETHASONE SODIUM PHOSPHATE 10 MG/ML IJ SOLN
6.0000 mg | INTRAMUSCULAR | Status: DC
  Administered 2023-02-05 – 2023-02-07 (×3): 6 mg via INTRAVENOUS
  Filled 2023-02-04 (×3): qty 0.6

## 2023-02-04 MED ORDER — METHYLPREDNISOLONE SODIUM SUCC 125 MG IJ SOLR
60.0000 mg | Freq: Two times a day (BID) | INTRAMUSCULAR | Status: DC
  Administered 2023-02-04: 60 mg via INTRAVENOUS
  Filled 2023-02-04: qty 2

## 2023-02-04 MED ORDER — SODIUM ZIRCONIUM CYCLOSILICATE 10 G PO PACK
10.0000 g | PACK | Freq: Once | ORAL | Status: AC
  Administered 2023-02-04: 10 g
  Filled 2023-02-04: qty 1

## 2023-02-04 MED ORDER — METHYLPREDNISOLONE SODIUM SUCC 125 MG IJ SOLR: 60.0000 mg | INTRAMUSCULAR | Status: DC

## 2023-02-04 MED ORDER — REVEFENACIN 175 MCG/3ML IN SOLN
175.0000 ug | Freq: Every day | RESPIRATORY_TRACT | Status: DC
  Administered 2023-02-04 – 2023-02-07 (×4): 175 ug via RESPIRATORY_TRACT
  Filled 2023-02-04 (×4): qty 3

## 2023-02-04 MED ORDER — SODIUM CHLORIDE 0.9 % IV SOLN
2.0000 g | Freq: Once | INTRAVENOUS | Status: AC
  Administered 2023-02-04: 2 g via INTRAVENOUS
  Filled 2023-02-04: qty 12.5

## 2023-02-04 NOTE — ED Provider Notes (Signed)
[facility] AT [facility] Provider Note   CSN: [id number] Arrival date & time: 02/04/23  1226     History  Chief Complaint  Patient presents with   Shortness of Breath    Earl Thomas is a 72 y.o. male.  HPI      72yo male with history of chronic diastolic CHF, RA, hyperlipidemia, ILD (was evaluated for lung transplant but due to weight loss not a current candidate), GT in place for nutritional supplementation, chronic hypoxic respiratory failure on 4L O2 who presents with concern for worsening cough for last 3 days, shortness of breath, fever, chills and nausea.   Reports for past few days cough has worsened. Unable to raise anything/cough anything up but feels like something there. Dyspnea has been worsening, more pronounced this AM.  PT came to home and found him more short of breath, fever 103.  Was not fever with EMS or arrival here. Has felt like he has had fever, chills, nausea. No vomiting. Has had diarrhea yesterday and today, yesterday with 2 episodes, large episode today. No black or bloody stool. No urinary symptoms. No abdominal pain.   Would want intubation for short period of time if absolutely needed but would like to avoid given prior discussions with Dr. Celine Mans pulmonary  Past Medical History:  Diagnosis Date   Abnormal weight loss 12/04/2021   Allergic rhinitis 10/11/2022   Aortic atherosclerosis (HCC) 12/04/2021   Arthralgia of temporomandibular joint 12/04/2021   Arthritis    BMI 26.0-26.9,adult 10/22/2016   Chronic bronchitis (HCC) 06/17/2022   Chronic diastolic CHF (congestive heart failure) (HCC) 01/19/2022   Chronic fatigue syndrome 12/04/2021   Chronic hypoxic respiratory failure (HCC) 08/30/2022   Dyslipidemia 03/10/2012   Family history of prostate cancer 12/04/2021   GERD (gastroesophageal reflux disease) 01/19/2022   Glucose intolerance (impaired glucose tolerance)    Hemorrhage of rectum and anus  10/17/2022   Hyperlipidemia    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   Interstitial lung disease (HCC) 07/16/2019   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 (HCC) 10/17/2022   Long-term current use of high risk medication other than anticoagulant 07/16/2019   Other long term (current) drug therapy 12/04/2021   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 Authorizing Provider  albuterol (VENTOLIN HFA) 108 (90 Base) MCG/ACT inhaler Inhale 1-2 puffs into the lungs every 6 (six) hours as needed for wheezing or shortness of breath. 11/07/22  Yes Charlott Holler, MD  ascorbic acid (VITAMIN C) 500 MG tablet Take 500 mg by mouth daily.   Yes [provider]  aspirin EC 81 MG tablet Take 81 mg by mouth every morning.   Yes [provider]  atorvastatin (LIPITOR) 10 MG tablet Take 1 tablet (10 mg total) by mouth daily. 04/30/22  Yes Sagardia, Eilleen Kempf, MD  azelastine (ASTELIN) 0.1 % nasal spray Place 2 sprays into both nostrils 2 (two) times daily. Use in each nostril as directed Patient taking differently: Place 2 sprays into  both nostrils 2 (two) times daily as needed for rhinitis or allergies (congestion). Use in each nostril as directed 12/25/21  Yes Ferol Luz, MD  Brimonidine Tartrate (LUMIFY) 0.025 % SOLN Place 1 drop into both eyes every Sunday.   Yes [provider]  famotidine (PEPCID) 20 MG tablet Take 1  tablet (20 mg total) by mouth 2 (two) times daily. 04/30/22  Yes Sagardia, Eilleen Kempf, MD  levocetirizine (XYZAL) 5 MG tablet TAKE 1 TABLET BY MOUTH ONCE DAILY IN THE EVENING 07/09/22  Yes Ferol Luz, MD  loperamide (IMODIUM) 2 MG capsule Take 1 capsule (2 mg total) by mouth 4 (four) times daily as needed for diarrhea or loose stools. 06/07/22  Yes Long, Arlyss Repress, MD  Naphazoline HCl (CLEAR EYES OP) Place 1 drop into both eyes daily.   Yes [provider]  nitroGLYCERIN (NITROSTAT) 0.4 MG SL tablet Place 0.4 mg under the tongue every 5 (five) minutes as needed for chest pain.   Yes [provider]  riTUXimab (RITUXAN IV) Inject into the vein.   Yes [provider]  Testosterone Enanthate (XYOSTED) 75 MG/0.5ML SOAJ Inject 75 mg into the skin once a week. 10/21/22  Yes Sagardia, Eilleen Kempf, MD  Vibegron (GEMTESA) 75 MG TABS Take 1 tablet (75 mg total) by mouth daily at 6 (six) AM. 01/23/23  Yes Sagardia, Eilleen Kempf, MD      Allergies    Patient has no known allergies.    Review of Systems   Review of Systems  Physical Exam Updated Vital Signs BP 103/68 (BP Location: Right Arm)   Pulse (!) 114   Temp 98.2 F (36.8 C) (Axillary)   Resp (!) 48   Ht 5\' 5"  (1.651 m)   Wt 44.8 kg   SpO2 96%   BMI 16.44 kg/m  Physical Exam Vitals and nursing note reviewed.  Constitutional:      General: He is not in acute distress.    Appearance: He is well-developed. He is cachectic. He is ill-appearing. He is not diaphoretic.  HENT:     Head: Normocephalic and atraumatic.  Eyes:     Conjunctiva/sclera: Conjunctivae normal.  Cardiovascular:     Rate and Rhythm: Normal rate and regular rhythm.     Heart sounds: Normal heart sounds. No murmur heard.    No friction rub. No gallop.  Pulmonary:     Effort: Tachypnea and respiratory distress present.     Breath sounds: Rhonchi and rales present. No wheezing.  Abdominal:     General: There is no distension.      Palpations: Abdomen is soft.     Tenderness: There is no abdominal tenderness. There is no guarding.  Musculoskeletal:     Cervical back: Normal range of motion.  Skin:    General: Skin is warm and dry.  Neurological:     Mental Status: He is alert and oriented to person, place, and time.     ED Results / Procedures / Treatments   Labs (all labs ordered are listed, but only abnormal results are displayed) Labs Reviewed  SARS CORONAVIRUS 2 BY RT PCR - Abnormal; Notable for the following components:      Result Value   SARS Coronavirus 2 by RT PCR POSITIVE (*)    All other components within normal limits  CBC WITH DIFFERENTIAL/PLATELET - Abnormal; Notable for the following components:   RBC 6.80 (*)    Hemoglobin 18.0 (*)    HCT 59.6 (*)  RDW 16.8 (*)    Platelets 133 (*)    Lymphs Abs 0.4 (*)    All other components within normal limits  COMPREHENSIVE METABOLIC PANEL - Abnormal; Notable for the following components:   Potassium 5.8 (*)    Creatinine, Ser 1.38 (*)    Calcium 8.6 (*)    GFR, Estimated 55 (*)    All other components within normal limits  I-STAT CG4 LACTIC ACID, ED - Abnormal; Notable for the following components:   Lactic Acid, Venous 2.4 (*)    All other components within normal limits  I-STAT CHEM 8, ED - Abnormal; Notable for the following components:   Potassium 5.7 (*)    Creatinine, Ser 1.30 (*)    Calcium, Ion 1.04 (*)    Hemoglobin 21.1 (*)    HCT 62.0 (*)    All other components within normal limits  I-STAT ARTERIAL BLOOD GAS, ED - Abnormal; Notable for the following components:   pH, Arterial 7.323 (*)    pCO2 arterial 55.7 (*)    pO2, Arterial 78 (*)    Bicarbonate 28.7 (*)    HCT 54.0 (*)    Hemoglobin 18.4 (*)    All other components within normal limits  TROPONIN I (HIGH SENSITIVITY) - Abnormal; Notable for the following components:   Troponin I (High Sensitivity) 108 (*)    All other components within normal limits  TROPONIN I (HIGH  SENSITIVITY) - Abnormal; Notable for the following components:   Troponin I (High Sensitivity) 112 (*)    All other components within normal limits  RESPIRATORY PANEL BY PCR  CULTURE, BLOOD (ROUTINE X 2)  CULTURE, BLOOD (ROUTINE X 2)  EXPECTORATED SPUTUM ASSESSMENT W GRAM STAIN, RFLX TO RESP C  PROCALCITONIN  LACTIC ACID, PLASMA  BRAIN NATRIURETIC PEPTIDE  STREP PNEUMONIAE URINARY ANTIGEN  LEGIONELLA PNEUMOPHILA SEROGP 1 UR AG  LACTIC ACID, PLASMA  HIV ANTIBODY (ROUTINE TESTING W REFLEX)  CBC  CREATININE, SERUM  CBC  BASIC METABOLIC PANEL  BLOOD GAS, ARTERIAL  MAGNESIUM  PHOSPHORUS  I-STAT CG4 LACTIC ACID, ED    EKG EKG Interpretation Date/Time:  Tuesday February 04 2023 12:36:39 EDT Ventricular Rate:  124 PR Interval:  164 QRS Duration:  72 QT Interval:  284 QTC Calculation: 408 R Axis:   136  Text Interpretation: Sinus tachycardia LAE, consider biatrial enlargement Confirmed by Virgina Norfolk [phone]) on 02/04/2023 5:17:48 PM  Radiology DG Chest Portable 1 View  Result Date: 02/04/2023 CLINICAL DATA:  Shortness of breath EXAM: PORTABLE CHEST 1 VIEW COMPARISON:  Chest radiograph 06/07/2022, CT chest 12/30/2022 FINDINGS: The cardiomediastinal silhouette is grossly stable Extensive interstitial lung disease is again seen throughout both lungs, not significantly changed. There is no convincing acute airspace opacity. There is no pleural effusion or pneumothorax There is no acute osseous abnormality. A presumed gastrostomy tube is seen projecting over the left upper quadrant. IMPRESSION: Extensive interstitial lung disease without convincing acute airspace opacity. Electronically Signed   By: Lesia Hausen M.D.   On: 02/04/2023 13:54    Procedures .Critical Care  Performed by: Alvira Monday, MD Authorized by: Alvira Monday, MD   Critical care provider statement:    Critical care time (minutes):  30   Critical care was time spent personally by me on the following  activities:  Development of treatment plan with patient or surrogate, discussions with consultants, examination of patient, ordering and review of radiographic studies, ordering and performing treatments and interventions, pulse oximetry and review of old charts  Medications Ordered in ED Medications  ipratropium-albuterol (DUONEB) 0.5-2.5 (3) MG/3ML nebulizer solution 3 mL (has no administration in time range)  arformoterol (BROVANA) nebulizer solution 15 mcg (has no administration in time range)  revefenacin (YUPELRI) nebulizer solution 175 mcg (175 mcg Nebulization Given by Other 02/04/23 1819)  budesonide (PULMICORT) nebulizer solution 0.25 mg (has no administration in time range)  docusate sodium (COLACE) capsule 100 mg (has no administration in time range)  polyethylene glycol (MIRALAX / GLYCOLAX) packet 17 g (has no administration in time range)  heparin injection 5,000 Units (has no administration in time range)  tocilizumab (ACTEMRA) 8 mg/kg = 360 mg in sodium chloride 0.9 % 100 mL infusion (has no administration in time range)  dexamethasone (DECADRON) injection 6 mg (has no administration in time range)  vancomycin (VANCOCIN) IVPB 1000 mg/200 mL premix (0 mg Intravenous Stopped 02/04/23 2005)  ceFEPIme (MAXIPIME) 2 g in sodium chloride 0.9 % 100 mL IVPB (0 g Intravenous Stopped 02/04/23 1900)  lactated ringers bolus 1,000 mL (0 mLs Intravenous Stopped 02/04/23 1613)  lactated ringers bolus 1,000 mL (1,000 mLs Intravenous New Bag/Given 02/04/23 2022)    ED Course/ Medical Decision Making/ A&P                                  [phone] male with history of chronic diastolic CHF, RA, hyperlipidemia, ILD (was evaluated for lung transplant but due to weight loss not a current candidate), GT in place for nutritional supplementation, chronic hypoxic respiratory failure on 4L O2 who presents with concern for worsening cough for last 3 days, shortness of breath, fever, chills and nausea.    Placed on nonrebreather with EMS, fever at home, here 99.9, tachycardia, BP 90/75 initially. Family reports baseline BP 90s.  Differential diagnosis for dyspnea includes ACS, PE, COPD exacerbation, CHF exacerbation, anemia, pneumonia, viral etiology such as COVID 19 infection, metabolic abnormality, progression of ILD.    Chest x-ray was done which showed diffuse patchy infiltrates, similar to prior with ILD, difficult to exclude underlying infection on my evaluation.   EKG was evaluated by me which showed sinus tachycardia.  Ordered blood cultures, labs including BNP/troponin, COVID testing.  At this time given chills/fever/nausea have lower suspicion for acute PE.  Has crackles on exam, difficult to rule out underlying CHF, has chronic hypotension, would like to avoid intubation if possible, and began with order for 1L of LR for hydration rather than full 30cc/kg for sepsis given continued consideration of other etiologies of acute on chronic hypoxic respiratory failure.   Placed on BiPAP, given empiric vanc/cefepime for HCAP, 1L fluid,  Consulted pulmonology given history of severe ILD.  Signed out to Dr. Lockie Mola with labs and admission pending.         Final Clinical Impression(s) / ED Diagnoses Final diagnoses:  COVID-19  Acute respiratory failure with hypoxia [facility])    Rx / DC Orders ED Discharge Orders     None         Alvira Monday, MD 02/04/23 2145

## 2023-02-04 NOTE — Progress Notes (Signed)
   02/04/23 2151  BiPAP/CPAP/SIPAP  $ Face Mask Small Yes  BiPAP/CPAP/SIPAP Pt Type Adult  BiPAP/CPAP/SIPAP V60  Mask Type Full face mask  Mask Size Small  Set Rate 10 breaths/min  Respiratory Rate 32 breaths/min  IPAP 18 cmH20  EPAP 5 cmH2O  FiO2 (%) 30 %  Minute Ventilation 21.5  Leak 5  Peak Inspiratory Pressure (PIP) 16  Tidal Volume (Vt) 662  Patient Home Equipment No  Auto Titrate No  Press High Alarm 25 cmH2O  Press Low Alarm 5 cmH2O  Safety Check Completed by RT for Home Unit Yes, no issues noted  Oxygen Percent 30 %

## 2023-02-04 NOTE — ED Notes (Signed)
Rn able to obtain 2 IVs that will not return blood, NT + attempted straight stick x 2 unsuccessful. Phlebotomy notified.

## 2023-02-04 NOTE — Progress Notes (Addendum)
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 Patient is tolerating BIPAP so far.  eICU Interventions  New Patient Evaluation.        Migdalia Dk 02/04/2023, 9:34 PM

## 2023-02-04 NOTE — H&P (Signed)
PCCM interval progress note, see Consult note done earlier for H&P  Pt is positive for Covid, I re-evaluated in the ED.  He is slightly more tachypneic, though maintaining sats >96% and states that he feels about the same.  I affirmed that he still wants the to be intubated if he worsens, wife at the bedside and agrees.     -we will admit to ICU and change solumedrol to dexamethasone tomorrow -start Actemra though is on Rituximab feel that benefit outweighs risk, d/w Dr. Katrinka Blazing -lactic 2.4, will give 1L bolus slowly, he looks dry -continue broad spectrum abx -at high risk for intubation tonight, will notify the night team    Darcella Gasman Vrinda Heckstall, PA-C [facility] See Amion for pager If no response to pager , please call [phone] until 7pm After 7:00 pm call Elink  [phone]

## 2023-02-04 NOTE — Telephone Encounter (Signed)
Dee from [employer] called to report unusual findings during the pt's [facility] visit today.  Oxygen: 88-91 Heart Rate: 133 (consistent, sitting, standing, & walking)  BP: 90/62 Temp: 100.1 Pt has also been experiencing diarrhea since yesterday morning.   I recommended pt seek emergency care. Dee and pt expressed understanding.

## 2023-02-04 NOTE — Progress Notes (Signed)
ED Pharmacy Antibiotic Sign Off An antibiotic consult was received from an ED provider for Vancomycin and Cefepime per pharmacy dosing for Sepsis. A chart review was completed to assess appropriateness.   The following one time order(s) were placed:  Vancomycin 1000mg  IV once Cefepime 2g IV onc  Further antibiotic and/or antibiotic pharmacy consults should be ordered by the admitting provider if indicated.   Thank you for allowing pharmacy to be a part of this patient's care.   Eldridge Scot, PharmD Clinical Pharmacist 02/04/2023, 1:32 PM

## 2023-02-04 NOTE — ED Notes (Signed)
waited by doctors office 7 min so I gave critical results to RN

## 2023-02-04 NOTE — Consult Note (Addendum)
NAME:  Earl Thomas, MRN:  [id number], DOB:  [birth year], LOS: 0 ADMISSION DATE:  02/04/2023, CONSULTATION DATE:  02/04/23 REFERRING MD:  Lockie Mola, CHIEF COMPLAINT:  shortness of breath   History of Present Illness:  Earl Thomas is a 72 y.o. 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 Celine Mans and is on 4L home O2,  malnutrition with PEG, HL who presented from home where he has had worsening shortness of breath, dry cough, chills, nausea and diarrhea over the last 3-4 days.  Pt denies vomiting, chest pain or abdominal pain and has not had melena or hematochezia,  No leg swelling or ill contacts.  In the ED, pt required bipap and had a temperature of 100F.   CXR shows his chronic lung disease without an acute infiltrate.   He was given broad spectrum antibiotics and is to be admitted to the floor.  PCCM was consulted in this setting.  Labs are pending at the time of admission  Pertinent  Medical History   has a past medical history of Abnormal weight loss (12/04/2021), Allergic rhinitis (10/11/2022), Aortic atherosclerosis (HCC) (12/04/2021), Arthralgia of temporomandibular joint (12/04/2021), Arthritis, BMI 26.0-26.9,adult (10/22/2016), Chronic bronchitis (HCC) (06/17/2022), Chronic diastolic CHF (congestive heart failure) (HCC) (01/19/2022), Chronic fatigue syndrome (12/04/2021), Chronic hypoxic respiratory failure (HCC) (08/30/2022), Dyslipidemia (03/10/2012), Family history of prostate cancer (12/04/2021), GERD (gastroesophageal reflux disease) (01/19/2022), Glucose intolerance (impaired glucose tolerance), Hemorrhage of rectum and anus (10/17/2022), Hyperlipidemia, 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), Interstitial lung disease (HCC) (07/16/2019), 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 (HCC) (10/17/2022), Long-term current use of high risk medication other than anticoagulant (07/16/2019), Other long term  (current) drug therapy (12/04/2021), 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   Significant Hospital Events: Including procedures, antibiotic start and stop dates in addition to other pertinent events   7/30 Pt presented with worsening shortness of breath requiring bipap  Interim History / Subjective:  Pt reports feeling "pretty good" and improved since arrival   Objective   Blood pressure 92/77, pulse (!) 112, temperature 99.9 F (37.7 C), temperature source Oral, resp. rate (!) 44, height 5\' 5"  (1.651 m), weight 45 kg, SpO2 100%.    FiO2 (%):  [30 %] 30 %  No intake or output data in the 24 hours ending 02/04/23 1606 Filed Weights   02/04/23 1235  Weight: 45 kg    General:  frail and chronically ill-appearing M resting in bed in no acute distress on bipap HEENT: MM pink/moist Neuro: alert and oriented, conversational CV: s1s2 tachycardic, regular, no m/r/g PULM:  course breath sounds throughout, tachypneic with mild accessory muscle use, tolerating bipap 30% FiO2 GI: soft, non-tender, PEG tube in place Extremities: warm/dry, no edema  Skin: no rashes or lesions   Resolved Hospital Problem list     Assessment & Plan:    Acute on Chronic Hypoxic Respiratory Failure in the setting of 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 and immunosuppression  Likely secondary to ILD flare vs viral URI, no definitive infiltrate on CXR Low suspicion for PE given infectious sx's, though follow lactic acid and trop Does not have a hx of HF and appears mildly volume down on exam -continue Bipap, currently tolerating well, he  is  currently stable for admission to the floor -continue broad spectrum abx, recommend Vanc/Zosyn -respiratory cultures, check covid and RVP along with legionella and strep pneumo  -start solumedrol and pulmicort, brovana and yupelri nebs -follow labs -has been seen at [facility] regarding transplant, needing to gain weight and has a PEG he is using for TF supplementation  -thank you for this consult, PCCM will continue to follow along    Best Practice (right click and "Reselect all SmartList Selections" daily)   Per primary team  Labs   CBC: No results for input(s): "WBC", "NEUTROABS", "HGB", "HCT", "MCV", "PLT" in the last 168 hours.  Basic Metabolic Panel: No results for input(s): "NA", "K", "CL", "CO2", "GLUCOSE", "BUN", "CREATININE", "CALCIUM", "MG", "PHOS" in the last 168 hours. GFR: CrCl cannot be calculated (Patient's most recent lab result is older than the maximum 21 days allowed.). No results for input(s): "PROCALCITON", "WBC", "LATICACIDVEN" in the last 168 hours.  Liver Function Tests: No results for input(s): "AST", "ALT", "ALKPHOS", "BILITOT", "PROT", "ALBUMIN" in the last 168 hours. No results for input(s): "LIPASE", "AMYLASE" in the last 168 hours. No results for input(s): "AMMONIA" in the last 168 hours.  ABG    Component Value Date/Time   HCO3 28.9 (H) 01/19/2022 0309   O2SAT 99.2 01/19/2022 0309     Coagulation Profile: No results for input(s): "INR", "PROTIME" in the last 168 hours.  Cardiac Enzymes: No results for input(s): "CKTOTAL", "CKMB", "CKMBINDEX", "TROPONINI" in the last 168 hours.  HbA1C: Hemoglobin A1C  Date/Time Value Ref Range Status  09/07/2019 10:04 AM 5.9 (A) 4.0 - 5.6 % Final  11/12/2017 09:59 AM 5.9  Final   Hgb A1c MFr Bld  Date/Time Value Ref Range Status  05/14/2019 09:56 AM 6.2 (H) 4.8 - 5.6 % Final    Comment:             Prediabetes: 5.7 - 6.4          Diabetes: >6.4          Glycemic control for adults with diabetes: <7.0    05/14/2017 04:34 PM 6.0 (H) 4.8 - 5.6 % Final    Comment:             Prediabetes: 5.7 - 6.4          Diabetes: >6.4          Glycemic control for adults with diabetes: <7.0     CBG: No results for input(s): "GLUCAP" in the last 168 hours.  Review of Systems:   Please see the history of present illness. All other systems reviewed and are negative    Past Medical History:  He,  has a past medical history of Abnormal weight loss (12/04/2021), Allergic rhinitis (10/11/2022), Aortic atherosclerosis (HCC) (12/04/2021), Arthralgia of temporomandibular joint (12/04/2021), Arthritis, BMI 26.0-26.9,adult (10/22/2016), Chronic bronchitis (HCC) (06/17/2022), Chronic diastolic CHF (congestive heart failure) (HCC) (01/19/2022), Chronic fatigue syndrome (12/04/2021), Chronic hypoxic respiratory failure (HCC) (08/30/2022), Dyslipidemia (03/10/2012), Family history of prostate cancer (12/04/2021), GERD (gastroesophageal reflux disease) (01/19/2022), Glucose intolerance (impaired glucose tolerance), Hemorrhage of rectum and anus (10/17/2022), Hyperlipidemia, 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), Interstitial lung disease (HCC) (07/16/2019), 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 (HCC) (10/17/2022), Long-term current use of high risk medication other than anticoagulant (07/16/2019), Other long term (current) drug therapy (12/04/2021), 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   Surgical History:   Past Surgical History:  Procedure Laterality Date   IR GASTROSTOMY  TUBE MOD SED  10/03/2022   ROTATOR CUFF REPAIR Right 01/02/16     Social History:   reports that he quit smoking about 34 years ago. His smoking use included pipe. He has never been exposed to tobacco smoke. He has never used smokeless tobacco. He reports that he does not drink alcohol and does not use drugs.   Family History:  His family history includes Cancer (age of onset: 46) in his father; Cancer (age of onset: 97) in his mother; Diabetes in his brother and father; Heart disease in his brother and mother; Heart disease (age of onset: 64) in his father; Hyperlipidemia in his brother.   Allergies No Known Allergies   Home Medications  Prior to Admission medications   Medication Sig Start Date End Date Taking? Authorizing Provider  acetaminophen (TYLENOL) 500 MG tablet Take 1,000 mg by mouth every 6 (six) hours as needed for headache (pain).    [provider]  albuterol (VENTOLIN HFA) 108 (90 Base) MCG/ACT inhaler Inhale 1-2 puffs into the lungs every 6 (six) hours as needed for wheezing or shortness of breath. 11/07/22   Charlott Holler, MD  ascorbic acid (VITAMIN C) 500 MG tablet Take 500 mg by mouth daily.    [provider]  aspirin EC 81 MG tablet Take 81 mg by mouth every morning.    [provider]  atorvastatin (LIPITOR) 10 MG tablet Take 1 tablet (10 mg total) by mouth daily. 04/30/22   Georgina Quint, MD  azelastine (ASTELIN) 0.1 % nasal spray Place 2 sprays into both nostrils 2 (two) times daily. Use in each nostril as directed Patient taking differently: Place 2 sprays into both nostrils 2 (two) times daily as needed for rhinitis or allergies (congestion). Use in each nostril as directed 12/25/21   Ferol Luz, MD  Brimonidine Tartrate (LUMIFY) 0.025 % SOLN Place 1 drop into both eyes every Sunday.    [provider]  cetirizine (ZYRTEC) 10 MG tablet Take 10 mg by mouth daily.    [provider]  dronabinol (MARINOL) 2.5 MG  capsule Take 1 capsule (2.5 mg total) by mouth 2 (two) times daily before a meal. 04/18/22   Charlott Holler, MD  famotidine (PEPCID) 20 MG tablet Take 1 tablet (20 mg total) by mouth 2 (two) times daily. 04/30/22   Georgina Quint, MD  folic acid ([name]) 1 MG tablet Take 1 mg by mouth every morning. 08/27/19   [provider]  guaifenesin (ROBITUSSIN) 100 MG/5ML syrup Take 200 mg by mouth 3 (three) times daily as needed for cough or congestion.    [provider]  HYDROcodone-acetaminophen (NORCO) 5-325 MG tablet Take 1 tablet by mouth every 6 (six) hours as needed for moderate pain. 10/03/22   Brayton El, PA-C  hydrocortisone (PROCTOZONE-HC) 2.5 % rectal cream Place 1 Application rectally 2 (two) times daily. 05/08/22   Georgina Quint, MD  levocetirizine (XYZAL) 5 MG tablet TAKE 1 TABLET BY MOUTH ONCE DAILY IN THE EVENING 07/09/22   Ferol Luz, MD  loperamide (IMODIUM) 2 MG capsule Take 1 capsule (2 mg total) by mouth 4 (four) times daily as needed for diarrhea or loose stools. 06/07/22   Long, Arlyss Repress, MD  meloxicam (MOBIC) 7.5 MG tablet Take 1 tablet (  7.5 mg total) by mouth daily as needed for pain. 06/04/22   Parrett, Virgel Bouquet, NP  Naphazoline HCl (CLEAR EYES OP) Place 1 drop into both eyes daily.    [provider]  nitroGLYCERIN (NITROSTAT) 0.4 MG SL tablet Place 0.4 mg under the tongue every 5 (five) minutes as needed for chest pain.    [provider]  ondansetron (ZOFRAN-ODT) 4 MG disintegrating tablet Take 1 tablet (4 mg total) by mouth every 8 (eight) hours as needed. 06/07/22   Long, Arlyss Repress, MD  riTUXimab (RITUXAN IV) Inject into the vein.    [provider]  Testosterone Enanthate (XYOSTED) 75 MG/0.5ML SOAJ Inject 75 mg into the skin once a week. 10/21/22   Georgina Quint, MD  Vibegron (GEMTESA) 75 MG TABS Take 1 tablet (75 mg total) by mouth daily at 6 (six) AM. 01/23/23   Georgina Quint, MD     Critical care  time:  35 minutes       CRITICAL CARE Performed by: Darcella Gasman Anush Wiedeman   Total critical care time: 35 minutes  Critical care time was exclusive of separately billable procedures and treating other patients.  Critical care was necessary to treat or prevent imminent or life-threatening deterioration.  Critical care was time spent personally by me on the following activities: development of treatment plan with patient and/or surrogate as well as nursing, discussions with consultants, evaluation of patient's response to treatment, examination of patient, obtaining history from patient or surrogate, ordering and performing treatments and interventions, ordering and review of laboratory studies, ordering and review of radiographic studies, pulse oximetry and re-evaluation of patient's condition.   Darcella Gasman Kalief Kattner, PA-C [facility] See Amion for pager If no response to pager , please call [phone] until 7pm After 7:00 pm call Elink  [phone]

## 2023-02-04 NOTE — Telephone Encounter (Signed)
Agree with recommendation to go to the emergency department.  Thanks.

## 2023-02-04 NOTE — ED Notes (Signed)
ED TO INPATIENT HANDOFF REPORT  ED Nurse Name and Phone #: Francene Castle, RN [phone]  S Name/Age/Gender Earl Thomas 73 y.o. male Room/Bed: 022C/022C  Code Status   Code Status: Full Code  Home/SNF/Other Home  Patient oriented to: self, place, time, and situation Is this baseline? Yes   Triage Complete: Triage complete  Chief Complaint Respiratory distress [R06.03]  Triage Note Pt to the ed from home with a  CC of increased sob. Pt is typically on 4 lpm o2 continuously. Pt was having a harder time breathing this morning when his PT came by. Pt relays fever, chills, nausea, without vomiting. EMS placed pt on a non re breather. Pt its coughing and SOB currently.    Allergies No Known Allergies  Level of Care/Admitting Diagnosis ED Disposition     ED Disposition  Admit   Condition  --   Comment  Hospital Area: [facility] [[id number]]  Level of Care: ICU [6]  May admit patient to [facility] or [facility] if equivalent level of care is available:: No  Covid Evaluation: Confirmed COVID Positive  Diagnosis: Respiratory distress [841324]  Admitting Physician: Lorin Glass [[id number]]  Attending Physician: Lorin Glass [[id number]]  Certification:: I certify this patient will need inpatient services for at least 2 midnights  Estimated Length of Stay: 4          B Medical/Surgery History Past Medical History:  Diagnosis Date   Abnormal weight loss 12/04/2021   Allergic rhinitis 10/11/2022   Aortic atherosclerosis (HCC) 12/04/2021   Arthralgia of temporomandibular joint 12/04/2021   Arthritis    BMI 26.0-26.9,adult 10/22/2016   Chronic bronchitis (HCC) 06/17/2022   Chronic diastolic CHF (congestive heart failure) (HCC) 01/19/2022   Chronic fatigue syndrome 12/04/2021   Chronic hypoxic respiratory failure (HCC) 08/30/2022   Dyslipidemia 03/10/2012   Family history of prostate cancer 12/04/2021   GERD (gastroesophageal reflux  disease) 01/19/2022   Glucose intolerance (impaired glucose tolerance)    Hemorrhage of rectum and anus 10/17/2022   Hyperlipidemia    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   Interstitial lung disease (HCC) 07/16/2019   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 (HCC) 10/17/2022   Long-term current use of high risk medication other than anticoagulant 07/16/2019   Other long term (current) drug therapy 12/04/2021   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 LUQ 10/03/22  1221  LUQ  124            Intake/Output Last 24 hours  Intake/Output Summary (Last 24 hours) at 02/04/2023 2006 Last data filed at 02/04/2023 2005 Gross per 24 hour  Intake 200 ml  Output --  Net 200  ml    Labs/Imaging Results for orders placed or performed during the hospital encounter of 02/04/23 (from the past 48 hour(s))  SARS Coronavirus 2 by RT PCR (hospital order, performed in [facility] hospital lab) *cepheid single result test* Anterior Nasal Swab     Status: Abnormal   Collection Time: 02/04/23  4:00 PM   Specimen: Anterior Nasal  Swab  Result Value Ref Range   SARS Coronavirus 2 by RT PCR POSITIVE (A) NEGATIVE    Comment: Performed at [facility], [address], [place], [place] [address]  Respiratory (~20 pathogens) panel by PCR     Status: None   Collection Time: 02/04/23  4:00 PM   Specimen: Nasopharyngeal Swab; Respiratory  Result Value Ref Range   Adenovirus NOT DETECTED NOT DETECTED   Coronavirus 229E NOT DETECTED NOT DETECTED    Comment: (NOTE) The Coronavirus on the Respiratory Panel, DOES NOT test for the novel  Coronavirus (2019 nCoV)    Coronavirus HKU1 NOT DETECTED NOT DETECTED   Coronavirus NL63 NOT DETECTED NOT DETECTED   Coronavirus OC43 NOT DETECTED NOT DETECTED   Metapneumovirus NOT DETECTED NOT DETECTED   Rhinovirus / Enterovirus NOT DETECTED NOT DETECTED   Influenza A NOT DETECTED NOT DETECTED   Influenza B NOT DETECTED NOT DETECTED   Parainfluenza Virus 1 NOT DETECTED NOT DETECTED   Parainfluenza Virus 2 NOT DETECTED NOT DETECTED   Parainfluenza Virus 3 NOT DETECTED NOT DETECTED   Parainfluenza Virus 4 NOT DETECTED NOT DETECTED   Respiratory Syncytial Virus NOT DETECTED NOT DETECTED   Bordetella pertussis NOT DETECTED NOT DETECTED   Bordetella Parapertussis NOT DETECTED NOT DETECTED   Chlamydophila pneumoniae NOT DETECTED NOT DETECTED   Mycoplasma pneumoniae NOT DETECTED NOT DETECTED    Comment: Performed at [facility] Lab, [address] [place], [place] [address]  I-Stat arterial blood gas, ED [facility] ED, [facility], [facility])     Status: Abnormal   Collection Time: 02/04/23  4:07 PM  Result Value Ref Range   pH, Arterial 7.323 (L) 7.35 - 7.45   pCO2 arterial 55.7 (H) 32 - 48 mmHg   pO2, Arterial 78 (L) 83 - 108 mmHg   Bicarbonate 28.7 (H) 20.0 - 28.0 mmol/L   TCO2 30 22 - 32 mmol/L   O2 Saturation 93 %   Acid-Base Excess 1.0 0.0 - 2.0 mmol/L   Sodium 140 135 - 145 mmol/L   Potassium 4.8 3.5 - 5.1 mmol/L   Calcium, Ion 1.18 1.15 - 1.40 mmol/L   HCT 54.0 (H) 39.0 - 52.0 %    Hemoglobin 18.4 (H) 13.0 - 17.0 g/dL   Patient temperature 52.8 F    Collection site RADIAL, ALLEN'S TEST ACCEPTABLE    Drawn by RT    Sample type ARTERIAL   CBC with Differential     Status: Abnormal   Collection Time: 02/04/23  6:04 PM  Result Value Ref Range   WBC 4.9 4.0 - 10.5 K/uL   RBC 6.80 (H) 4.22 - 5.81 MIL/uL   Hemoglobin 18.0 (H) 13.0 - 17.0 g/dL    Comment: REPEATED TO VERIFY   HCT 59.6 (H) 39.0 - 52.0 %   MCV 87.6 80.0 - 100.0 fL   MCH 26.5 26.0 - 34.0 pg   MCHC 30.2 30.0 - 36.0 g/dL   RDW 41.3 (H) 24.4 - 01.0 %   Platelets 133 (L) 150 - 400 K/uL   nRBC 0.0 0.0 - 0.2 %   Neutrophils Relative % 80 %  Neutro Abs 3.9 1.7 - 7.7 K/uL   Lymphocytes Relative 8 %   Lymphs Abs 0.4 (L) 0.7 - 4.0 K/uL   Monocytes Relative 10 %   Monocytes Absolute 0.5 0.1 - 1.0 K/uL   Eosinophils Relative 1 %   Eosinophils Absolute 0.0 0.0 - 0.5 K/uL   Basophils Relative 1 %   Basophils Absolute 0.0 0.0 - 0.1 K/uL   Immature Granulocytes 0 %   Abs Immature Granulocytes 0.01 0.00 - 0.07 K/uL    Comment: Performed at [facility] Lab, [address] [place], [place] [address]  Comprehensive metabolic panel     Status: Abnormal   Collection Time: 02/04/23  6:04 PM  Result Value Ref Range   Sodium 136 135 - 145 mmol/L   Potassium 5.8 (H) 3.5 - 5.1 mmol/L   Chloride 98 98 - 111 mmol/L   CO2 27 22 - 32 mmol/L   Glucose, Bld 80 70 - 99 mg/dL    Comment: Glucose reference range applies only to samples taken after fasting for at least 8 hours.   BUN 15 8 - 23 mg/dL   Creatinine, Ser 6.30 (H) 0.61 - 1.24 mg/dL   Calcium 8.6 (L) 8.9 - 10.3 mg/dL   Total Protein 7.0 6.5 - 8.1 g/dL   Albumin 3.6 3.5 - 5.0 g/dL   AST 41 15 - 41 U/L   ALT 33 0 - 44 U/L   Alkaline Phosphatase 66 38 - 126 U/L   Total Bilirubin 1.2 0.3 - 1.2 mg/dL   GFR, Estimated 55 (L) >60 mL/min    Comment: (NOTE) Calculated using the CKD-EPI Creatinine Equation (2021)    Anion gap 11 5 - 15    Comment: Performed at  [facility], [address], [place], [place] [address]  Troponin I (High Sensitivity)     Status: Abnormal   Collection Time: 02/04/23  6:04 PM  Result Value Ref Range   Troponin I (High Sensitivity) 108 (HH) <18 ng/L    Comment: CRITICAL VALUE NOTED.  VALUE IS CONSISTENT WITH PREVIOUSLY REPORTED AND CALLED VALUE. (NOTE) Elevated high sensitivity troponin I (hsTnI) values and significant  changes across serial measurements may suggest ACS but many other  chronic and acute conditions are known to elevate hsTnI results.  Refer to the "Links" section for chest pain algorithms and additional  guidance. Performed at [facility], [address] [place], [place] [address]   Troponin I (High Sensitivity)     Status: Abnormal   Collection Time: 02/04/23  6:04 PM  Result Value Ref Range   Troponin I (High Sensitivity) 112 (HH) <18 ng/L    Comment: CRITICAL RESULT CALLED TO, READ BACK BY AND VERIFIED WITH H.HERNANDEZ,RN @1925  02/04/2023 VANG.J (NOTE) Elevated high sensitivity troponin I (hsTnI) values and significant  changes across serial measurements may suggest ACS but many other  chronic and acute conditions are known to elevate hsTnI results.  Refer to the "Links" section for chest pain algorithms and additional  guidance. Performed at [facility], [address] [place], [place] [address]   Procalcitonin     Status: None   Collection Time: 02/04/23  6:04 PM  Result Value Ref Range   Procalcitonin 0.16 ng/mL    Comment:        Interpretation: PCT (Procalcitonin) <= 0.5 ng/mL: Systemic infection (sepsis) is not likely. Local bacterial infection is possible. (NOTE)       Sepsis PCT Algorithm  Lower Respiratory Tract                                      Infection PCT Algorithm    ----------------------------     ----------------------------         PCT < 0.25 ng/mL                PCT < 0.10 ng/mL          Strongly encourage             Strongly  discourage   discontinuation of antibiotics    initiation of antibiotics    ----------------------------     -----------------------------       PCT 0.25 - 0.50 ng/mL            PCT 0.10 - 0.25 ng/mL               OR       >80% decrease in PCT            Discourage initiation of                                            antibiotics      Encourage discontinuation           of antibiotics    ----------------------------     -----------------------------         PCT >= 0.50 ng/mL              PCT 0.26 - 0.50 ng/mL               AND        <80% decrease in PCT             Encourage initiation of                                             antibiotics       Encourage continuation           of antibiotics    ----------------------------     -----------------------------        PCT >= 0.50 ng/mL                  PCT > 0.50 ng/mL               AND         increase in PCT                  Strongly encourage                                      initiation of antibiotics    Strongly encourage escalation           of antibiotics                                     -----------------------------  PCT <= 0.25 ng/mL                                                 OR                                        > 80% decrease in PCT                                      Discontinue / Do not initiate                                             antibiotics  Performed at [facility], [address], [place], [place] [address]   Lactic acid, plasma     Status: None   Collection Time: 02/04/23  6:04 PM  Result Value Ref Range   Lactic Acid, Venous 1.9 0.5 - 1.9 mmol/L    Comment: Performed at [facility], [address] [place], [place] [address]  I-stat chem 8, ED (not at [facility], [facility] or [facility])     Status: Abnormal   Collection Time: 02/04/23  6:52 PM  Result Value Ref Range   Sodium 138 135 - 145 mmol/L   Potassium 5.7 (H) 3.5 - 5.1 mmol/L    Chloride 99 98 - 111 mmol/L   BUN 19 8 - 23 mg/dL   Creatinine, Ser 3.87 (H) 0.61 - 1.24 mg/dL   Glucose, Bld 77 70 - 99 mg/dL    Comment: Glucose reference range applies only to samples taken after fasting for at least 8 hours.   Calcium, Ion 1.04 (L) 1.15 - 1.40 mmol/L   TCO2 31 22 - 32 mmol/L   Hemoglobin 21.1 (HH) 13.0 - 17.0 g/dL   HCT 56.4 (H) 33.2 - 95.1 %  I-Stat CG4 Lactic Acid     Status: Abnormal   Collection Time: 02/04/23  6:53 PM  Result Value Ref Range   Lactic Acid, Venous 2.4 (HH) 0.5 - 1.9 mmol/L   Comment NOTIFIED PHYSICIAN    DG Chest Portable 1 View  Result Date: 02/04/2023 CLINICAL DATA:  Shortness of breath EXAM: PORTABLE CHEST 1 VIEW COMPARISON:  Chest radiograph 06/07/2022, CT chest 12/30/2022 FINDINGS: The cardiomediastinal silhouette is grossly stable Extensive interstitial lung disease is again seen throughout both lungs, not significantly changed. There is no convincing acute airspace opacity. There is no pleural effusion or pneumothorax There is no acute osseous abnormality. A presumed gastrostomy tube is seen projecting over the left upper quadrant. IMPRESSION: Extensive interstitial lung disease without convincing acute airspace opacity. Electronically Signed   By: Lesia Hausen M.D.   On: 02/04/2023 13:54    Pending Labs Unresulted Labs (From admission, onward)     Start     Ordered   02/05/23 0500  CBC  Tomorrow morning,   R        02/04/23 1852   02/05/23 0500  Basic metabolic panel  Tomorrow morning,   R        02/04/23 1852  02/05/23 0500  Blood gas, arterial  Tomorrow morning,   R        02/04/23 1852   02/05/23 0500  Magnesium  Tomorrow morning,   R        02/04/23 1852   02/05/23 0500  Phosphorus  Tomorrow morning,   R        02/04/23 1852   02/04/23 1852  HIV Antibody (routine testing w rflx)  (HIV Antibody (Routine testing w reflex) panel)  Once,   R        02/04/23 1852   02/04/23 1852  CBC  (heparin)  Once,   R       Comments: Baseline  for heparin therapy IF NOT ALREADY DRAWN.  Notify MD if PLT < 100 K.    02/04/23 1852   02/04/23 1852  Creatinine, serum  (heparin)  Once,   R       Comments: Baseline for heparin therapy IF NOT ALREADY DRAWN.    02/04/23 1852   02/04/23 1813  Lactic acid, plasma  (Lactic Acid)  Now then every 2 hours,   R      02/04/23 1812   02/04/23 1635  Expectorated Sputum Assessment w Gram Stain, Rflx to Resp Cult  Once,   R        02/04/23 1634   02/04/23 1635  Strep pneumoniae urinary antigen  Once,   URGENT        02/04/23 1634   02/04/23 1635  Legionella Pneumophila Serogp 1 Ur Ag  Once,   URGENT        02/04/23 1634   02/04/23 1320  Brain natriuretic peptide  Once,   URGENT        02/04/23 1321   02/04/23 1317  Blood culture (routine x 2)  BLOOD CULTURE X 2,   R      02/04/23 1321            Vitals/Pain Today's Vitals   02/04/23 1828 02/04/23 1830 02/04/23 1900 02/04/23 1910  BP:  111/85 103/72   Pulse: (!) 9 90 (!) 117 (!) 118  Resp:  18 (!) 44 (!) 41  Temp: 98 F (36.7 C) 98 F (36.7 C)    TempSrc: Oral Oral    SpO2:  95% 97% 97%  Weight:      Height:      PainSc:        Isolation Precautions Droplet precaution  Medications Medications  ipratropium-albuterol (DUONEB) 0.5-2.5 (3) MG/3ML nebulizer solution 3 mL (has no administration in time range)  arformoterol (BROVANA) nebulizer solution 15 mcg (has no administration in time range)  revefenacin (YUPELRI) nebulizer solution 175 mcg (175 mcg Nebulization Given by Other 02/04/23 1819)  budesonide (PULMICORT) nebulizer solution 0.25 mg (has no administration in time range)  docusate sodium (COLACE) capsule 100 mg (has no administration in time range)  polyethylene glycol (MIRALAX / GLYCOLAX) packet 17 g (has no administration in time range)  heparin injection 5,000 Units (has no administration in time range)  lactated ringers bolus 1,000 mL (has no administration in time range)  tocilizumab (ACTEMRA) 8 mg/kg = 360 mg in  sodium chloride 0.9 % 100 mL infusion (has no administration in time range)  dexamethasone (DECADRON) injection 6 mg (has no administration in time range)  vancomycin (VANCOCIN) IVPB 1000 mg/200 mL premix (0 mg Intravenous Stopped 02/04/23 2005)  ceFEPIme (MAXIPIME) 2 g in sodium chloride 0.9 % 100 mL IVPB (0 g Intravenous Stopped 02/04/23 1900)  lactated  ringers bolus 1,000 mL (0 mLs Intravenous Stopped 02/04/23 1613)    Mobility walks     Focused Assessments Pulmonary Assessment Handoff:  Lung sounds: Bilateral Breath Sounds: Diminished O2 Device: Bi-PAP      R Recommendations: See Admitting Provider Note  Report given to:   Additional Notes:

## 2023-02-04 NOTE — Progress Notes (Signed)
[facility] ED [facility] Liaison note:  This patient is currently enrolled in [facility] outpatient-based palliative care. Hospital Liaison will continue to follow for discharge disposition. Please call for any outpatient based palliative care related questions or concerns.  Thank you, Thea Gist BSN, RN [phone]

## 2023-02-04 NOTE — ED Triage Notes (Signed)
Pt to the ed from home with a  CC of increased sob. Pt is typically on 4 lpm o2 continuously. Pt was having a harder time breathing this morning when his PT came by. Pt relays fever, chills, nausea, without vomiting. EMS placed pt on a non re breather. Pt its coughing and SOB currently.

## 2023-02-04 NOTE — ED Provider Notes (Signed)
Patient for COVID.  Respiratory distress again slightly worse while on BiPAP.  ICU team reevaluated him and will admit.  He is lactic acid of 2.4.  White count 4.9.  Blood gas overall reassuring otherwise.  Patient to be admitted to ICU team for further care for COVID pneumonia.   Virgina Norfolk, DO 02/04/23 1912

## 2023-02-05 ENCOUNTER — Inpatient Hospital Stay (HOSPITAL_COMMUNITY): Payer: Medicare Other

## 2023-02-05 DIAGNOSIS — Z515 Encounter for palliative care: Secondary | ICD-10-CM | POA: Diagnosis not present

## 2023-02-05 DIAGNOSIS — R609 Edema, unspecified: Secondary | ICD-10-CM

## 2023-02-05 DIAGNOSIS — Z711 Person with feared health complaint in whom no diagnosis is made: Secondary | ICD-10-CM

## 2023-02-05 DIAGNOSIS — J9601 Acute respiratory failure with hypoxia: Secondary | ICD-10-CM

## 2023-02-05 DIAGNOSIS — Z7189 Other specified counseling: Secondary | ICD-10-CM | POA: Diagnosis not present

## 2023-02-05 DIAGNOSIS — R54 Age-related physical debility: Secondary | ICD-10-CM

## 2023-02-05 DIAGNOSIS — U071 COVID-19: Secondary | ICD-10-CM

## 2023-02-05 DIAGNOSIS — Z66 Do not resuscitate: Secondary | ICD-10-CM

## 2023-02-05 DIAGNOSIS — R0603 Acute respiratory distress: Secondary | ICD-10-CM

## 2023-02-05 LAB — POCT I-STAT 7, (LYTES, BLD GAS, ICA,H+H)
Acid-Base Excess: 1 mmol/L (ref 0.0–2.0)
Bicarbonate: 30.1 mmol/L — ABNORMAL HIGH (ref 20.0–28.0)
Calcium, Ion: 1.24 mmol/L (ref 1.15–1.40)
HCT: 53 % — ABNORMAL HIGH (ref 39.0–52.0)
Hemoglobin: 18 g/dL — ABNORMAL HIGH (ref 13.0–17.0)
O2 Saturation: 98 %
Potassium: 5.3 mmol/L — ABNORMAL HIGH (ref 3.5–5.1)
Sodium: 138 mmol/L (ref 135–145)
TCO2: 32 mmol/L (ref 22–32)
pCO2 arterial: 62.2 mmHg — ABNORMAL HIGH (ref 32–48)
pH, Arterial: 7.293 — ABNORMAL LOW (ref 7.35–7.45)
pO2, Arterial: 121 mmHg — ABNORMAL HIGH (ref 83–108)

## 2023-02-05 LAB — GLUCOSE, CAPILLARY: Glucose-Capillary: 152 mg/dL — ABNORMAL HIGH (ref 70–99)

## 2023-02-05 LAB — FERRITIN: Ferritin: 93 ng/mL (ref 24–336)

## 2023-02-05 LAB — MAGNESIUM: Magnesium: 2.3 mg/dL (ref 1.7–2.4)

## 2023-02-05 LAB — MRSA NEXT GEN BY PCR, NASAL: MRSA by PCR Next Gen: NOT DETECTED

## 2023-02-05 LAB — PHOSPHORUS: Phosphorus: 3.3 mg/dL (ref 2.5–4.6)

## 2023-02-05 MED ORDER — PROSOURCE TF20 ENFIT COMPATIBL EN LIQD
60.0000 mL | Freq: Every day | ENTERAL | Status: DC
  Administered 2023-02-05 – 2023-02-07 (×3): 60 mL
  Filled 2023-02-05 (×3): qty 60

## 2023-02-05 MED ORDER — MAGNESIUM SULFATE 2 GM/50ML IV SOLN
2.0000 g | Freq: Once | INTRAVENOUS | Status: AC
  Administered 2023-02-05: 2 g via INTRAVENOUS
  Filled 2023-02-05: qty 50

## 2023-02-05 MED ORDER — GUAIFENESIN 100 MG/5ML PO LIQD
5.0000 mL | ORAL | Status: DC | PRN
  Administered 2023-02-05 – 2023-02-07 (×4): 5 mL via ORAL
  Filled 2023-02-05 (×4): qty 5

## 2023-02-05 MED ORDER — THIAMINE MONONITRATE 100 MG PO TABS
100.0000 mg | ORAL_TABLET | Freq: Every day | ORAL | Status: DC
  Administered 2023-02-05 – 2023-02-07 (×3): 100 mg
  Filled 2023-02-05 (×3): qty 1

## 2023-02-05 MED ORDER — OSMOLITE 1.5 CAL PO LIQD
1000.0000 mL | ORAL | Status: DC
  Administered 2023-02-05 – 2023-02-06 (×3): 1000 mL
  Filled 2023-02-05 (×4): qty 1000

## 2023-02-05 MED ORDER — LORAZEPAM 0.5 MG PO TABS
0.5000 mg | ORAL_TABLET | Freq: Once | ORAL | Status: AC | PRN
  Administered 2023-02-05: 0.5 mg via ORAL
  Filled 2023-02-05: qty 1

## 2023-02-05 MED ORDER — ADULT MULTIVITAMIN W/MINERALS CH
1.0000 | ORAL_TABLET | Freq: Every day | ORAL | Status: DC
  Administered 2023-02-05 – 2023-02-07 (×3): 1 via ORAL
  Filled 2023-02-05 (×3): qty 1

## 2023-02-05 NOTE — Progress Notes (Signed)
   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  Significant Hospital Events:  Admitted to ICU for hypoxic respiratory failure  Interim History / Subjective:  Breathing okay on BiPAP.  Objective   Blood pressure 115/74, pulse (!) 106, temperature 98.3 F (36.8 C), temperature source Axillary, resp. rate (!) 29, height 5\' 5"  (1.651 m), weight 44.8 kg, SpO2 100%.    FiO2 (%):  [30 %-35 %] 35 % Pressure Support:  [13 cmH20] 13 cmH20   Intake/Output Summary (Last 24 hours) at 02/05/2023 0702 Last data filed at 02/05/2023 0400 Gross per 24 hour  Intake 933.3 ml  Output 300 ml  Net 633.3 ml   Filed Weights   02/04/23 1235 02/04/23 2122 02/05/23 0500  Weight: 45 kg 44.8 kg 44.8 kg    Examination: Ill, cachectic-appearing Heart rate tachycardic, rhythm normal, strong radials Breathing is tachypneic on BiPAP, decreased breath sounds throughout Skin warm and dry Alert, follows instructions  Labs: 7.293/62.2/121/30.1  Sodium 138  Potassium 5.3 Bicarb 27 BUN/creatinine 15/1.17 Phos 3.6 Mag 1.8  Venous lactate 2.8 => 1.9  WBC 4.6 Hemoglobin 18 Platelets 128  UA bland  Blood cultures pending SARS Cov 2 positive  Resolved Hospital Problem list   Resolved Problems:   * No resolved hospital problems. *   Assessment & Plan:  Principal Problem:   Respiratory distress  Hypoxic and hypercarbic respiratory failure due to COVID pneumonia End-stage ILD - Persistent respiratory acidosis despite BiPAP - If intubated would likely not survive to extubation - Dexamethasone - Triple therapy nebulizers - Tocilizumab - S/p 1 dose broad-spectrum abx, follow fever curve and WBC but doubt bacterial pneumonia - LE venous dopplers pending  Pulmonary  cachexia Gastrostomy tube dependent - Continue tube feeds  Best practice (daily eval):  Diet/type: tubefeeds DVT prophylaxis: prophylactic heparin  GI prophylaxis: N/A Lines: N/A Foley:  N/A Code Status:  limited, brief trial of intubation, but no CPR if pulseless  Marrianne Mood MD 02/05/2023, 7:02 AM  Pager: [phone]

## 2023-02-05 NOTE — Progress Notes (Signed)
   02/05/23 2334  BiPAP/CPAP/SIPAP  $ Face Mask Small Yes  BiPAP/CPAP/SIPAP Pt Type Adult  BiPAP/CPAP/SIPAP V60  Mask Type Full face mask  Mask Size Small  Set Rate 10 breaths/min  Respiratory Rate 31 breaths/min  IPAP 18 cmH20  EPAP 5 cmH2O  Pressure Support 13 cmH20  FiO2 (%) 35 %  Minute Ventilation 17  Tidal Volume (Vt) 496  Patient Home Equipment No  Auto Titrate No  Press High Alarm 25 cmH2O  Press Low Alarm 5 cmH2O  Safety Check Completed by RT for Home Unit Yes, no issues noted  Oxygen Percent 35 %

## 2023-02-05 NOTE — Progress Notes (Signed)
Initial Nutrition Assessment  DOCUMENTATION CODES:   Underweight, Severe malnutrition in context of chronic illness  INTERVENTION:  Initiate tube feeding via PEG: Osmolite 1.5 at 50 ml/h (1200 ml per day) Start at 50mL/h and advance by 10 q8h Prosource TF20 60 ml 1x/d Provides 1880 kcal, 95 gm protein, 914 ml free water daily Initiate PO diet when able, recommend regular When cleared for PO, add Ensure Enlive po TID, each supplement provides 350 kcal and 20 grams of protein MVI with minerals daily  NUTRITION DIAGNOSIS:   Severe Malnutrition related to chronic illness (ILD) as evidenced by severe fat depletion, severe muscle depletion.  GOAL:   Patient will meet greater than or equal to 90% of their needs  MONITOR:   Diet advancement, Labs, I & O's, Skin, TF tolerance  REASON FOR ASSESSMENT:   Consult (underweight) Enteral/tube feeding initiation and management  ASSESSMENT:   Pt with hx of CHF, HLD, GERD, G-tube, and chronic respiratory failure due to ILD presented to ED with worsening SOB. Found to be positive for COVID19.  Noted that G-tube was placed March 2024. Weight has appeared to stabilize but has not gained.  Pt resting in bed at the time of assessment on BiPAP. Discussed in rounds, ok to being enteral feeds via PEG.   Pt reports that at home, his recommended regimen is 4 cartons of Osmolite 1.5 each day. Sometimes he is only able to infuse 3. Goal regimen would provide 1420kcal and 60g of protein. Also continues to consume 3 meals a day and occasionally nutrition supplements.    Pt also endorses weight has stabilized but he has not gained. Discussed that feeds would be continuous for now and then we can transition to bolus once he is approaching discharge back home.  Will plan on meeting 100% of nutrition needs with EN.   Nutritionally Relevant Medications: Scheduled Meds:  dexamethasone (DECADRON) injection  6 mg Intravenous Q24H   feeding supplement  (PROSource TF20)  60 mL Per Tube Daily   thiamine  100 mg Per Tube Daily    feeding supplement (OSMOLITE 1.5 CAL) 1,000 mL (02/05/23 1139)   PRN Meds:.docusate sodium, polyethylene glycol  Labs Reviewed: K 5.3 CBG ranges from 109-122 mg/dL over the last 24 hours  NUTRITION - FOCUSED PHYSICAL EXAM:  Flowsheet Row Most Recent Value  Orbital Region Severe depletion  Upper Arm Region Severe depletion  Thoracic and Lumbar Region Severe depletion  Buccal Region Severe depletion  Temple Region Mild depletion  Clavicle Bone Region Severe depletion  Clavicle and Acromion Bone Region Severe depletion  Scapular Bone Region Moderate depletion  Dorsal Hand Mild depletion  Patellar Region Severe depletion  Anterior Thigh Region Severe depletion  Posterior Calf Region Mild depletion  Edema (RD Assessment) None  Hair Reviewed  Eyes Reviewed  Mouth Reviewed  Skin Reviewed  Nails Reviewed       Diet Order:   Diet Order     None       EDUCATION NEEDS:   Education needs have been addressed  Skin:  Skin Assessment: Reviewed RN Assessment  Last BM:  prior to admission  Height:   Ht Readings from Last 1 Encounters:  02/04/23 5\' 5"  (1.651 m)    Weight:   Wt Readings from Last 1 Encounters:  02/05/23 44.8 kg    Ideal Body Weight:  61.8 kg  BMI:  Body mass index is 16.44 kg/m.  Estimated Nutritional Needs:  Kcal:  1700-1900 kcal/d Protein:  80-100 g/d Fluid:  1.8L/d    Greig Castilla, RD, LDN Clinical Dietitian RD pager # available in [facility]  After hours/weekend pager # available in [facility]

## 2023-02-05 NOTE — Progress Notes (Signed)
[facility] ADULT ICU REPLACEMENT PROTOCOL   The patient does apply for the [facility] Adult ICU Electrolyte Replacment Protocol based on the criteria listed below:   1.Exclusion criteria: TCTS, ECMO, Dialysis, and Myasthenia Gravis patients 2. Is GFR >/= 30 ml/min? Yes.    Patient's GFR today is >60 3. Is SCr </= 2? Yes.   Patient's SCr is 1.17 mg/dL 4. Did SCr increase >/= 0.5 in 24 hours? No. 5.Pt's weight >40kg  Yes.   6. Abnormal electrolyte(s): Magnesium 1.8  7. Electrolytes replaced per protocol 8.  Call MD STAT for K+ </= 2.5, Phos </= 1, or Mag </= 1 Physician:  Dr. Namon Cirri A Synia Douglass 02/05/2023 2:07 AM

## 2023-02-05 NOTE — Consult Note (Signed)
Consultation Note Date: 02/05/2023   Patient Name: Earl Thomas  DOB: May 11, 1951  MRN: [id number]  Age / Sex: 72 y.o., male  PCP: Georgina Quint, MD Referring Physician: Lorin Glass, MD  Reason for Consultation: Establishing goals of care, "end stage ILD"  HPI/Patient Profile: 72 y.o. 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 Celine Mans and is on 4L home O2, severe protein calorie malnutrition s/p PEG presented to ED on 02/04/23 from home with increasing shortness of breath. 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   Of note, patient is followed by outpatient Palliative Care with [facility]. He has also had two admissions in the last 6 months.  Clinical Assessment and Goals of Care: I have reviewed medical records including EPIC notes, labs, and imaging. Received report from primary RN - no acute concerns.    Went to visit patient at bedside - no family/visitors present. Patient was lying in bed awake, alert, oriented, and able to participate in conversation though it is limited by BiPap use. No signs or non-verbal gestures of pain or discomfort noted. No respiratory distress, increased work of breathing, or secretions noted. Patient tells me he is "feeling pretty good at the moment." He endorses feeling much better than yesterday.  Met with patient  to discuss diagnosis, prognosis, GOC, EOL wishes, disposition, and options.  I introduced Palliative Medicine as specialized medical care for people living with serious illness. It focuses on providing relief from the symptoms and stress of a serious illness. The goal is to improve quality of life for both the patient and the family.  We discussed a brief life review of the patient as well as  functional and nutritional status. He is married. His last follow up with [facility] to discuss lung transplant was in January of this year, 2024. He had a PEG tube placed in April 2024 with goal to gain weight and become a lung transplant candidate. Per patient, he has gained weight since starting feeding tube but has not yet had a follow up with [facility] since January.  We discussed patient's current illness and what it means in the larger context of patient's on-going co-morbidities. Patient understands that ILD and CHF are a progressive, non-curable disease underlying the patient's current acute medical conditions. Patient understands he is at end stages of ILD and is likely still not a candidate for lung transplant at this time.  Natural disease trajectory and expectations at EOL were discussed. I attempted to elicit values and goals of care important to the patient. The difference between aggressive medical intervention and comfort care was considered in light of the patient's goals of care.   From previous discussions with outpatient Palliative Care, patient has a clear understanding of hospice/comfort care option He is not ready for this at this time but understands he would qualify if/when he was ready due to the end stages of his disease. Goals while admitted are to treat the treatable.  Advance directives, concepts specific to code status, artificial feeding and hydration, and rehospitalization were considered and discussed. Patient has a Living Will and HCPOA - he does not wish to make changes at this time. In the event of respiratory decline, patient would want a short trial period of intubation - he would not want indefinite life support. In the event he is pulseless/not breathing, he  would rather have a peaceful, natural passing and prefers DNR/DNI. He tells me he understands he is frail and would not want broken bones in a resuscitation attempt.  Offered to call his wife to provide updates - he tells  me she is coming later today and he will update her.  Discussed with patient the importance of continued conversation with family and the medical providers regarding overall plan of care and treatment options, ensuring decisions are within the context of the patient's values and GOCs.    Questions and concerns were addressed. The patient/family was encouraged to call with questions and/or concerns.   Primary Decision Maker: PATIENT    SUMMARY OF RECOMMENDATIONS   Continue full scope care - treat the treatable In the event of respiratory decline, patient would want a short trial period of intubation - he would not want indefinite life support If pulseless/not breathing - he prefers a peaceful, natural passing and DNR Ongoing GOC pending clinical course PMT will continue to follow and support holistically   Code Status/Advance Care Planning: DNR - see above  Palliative Prophylaxis:  Aspiration, Bowel Regimen, Frequent Pain Assessment, Oral Care, and Turn Reposition  Additional Recommendations (Limitations, Scope, Preferences): Full Scope Treatment  Psycho-social/Spiritual:  Desire for further Chaplaincy support:no Created space and opportunity for patient to express thoughts and feelings regarding patient's current medical situation.  Emotional support and therapeutic listening provided.  Prognosis:  Unable to determine  Discharge Planning: To Be Determined      Primary Diagnoses: Present on Admission:  Respiratory distress   I have reviewed the medical record, interviewed the patient and family, and examined the patient. The following aspects are pertinent.  Past Medical History:  Diagnosis Date   Abnormal weight loss 12/04/2021   Allergic rhinitis 10/11/2022   Aortic atherosclerosis (HCC) 12/04/2021   Arthralgia of temporomandibular joint 12/04/2021   Arthritis    BMI 26.0-26.9,adult 10/22/2016   Chronic bronchitis (HCC) 06/17/2022   Chronic diastolic CHF  (congestive heart failure) (HCC) 01/19/2022   Chronic fatigue syndrome 12/04/2021   Chronic hypoxic respiratory failure (HCC) 08/30/2022   Dyslipidemia 03/10/2012   Family history of prostate cancer 12/04/2021   GERD (gastroesophageal reflux disease) 01/19/2022   Glucose intolerance (impaired glucose tolerance)    Hemorrhage of rectum and anus 10/17/2022   Hyperlipidemia    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   Interstitial lung disease (HCC) 07/16/2019   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 (HCC) 10/17/2022   Long-term current use of high risk medication other than anticoagulant 07/16/2019   Other long term (current) drug therapy 12/04/2021   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  Smoked a pip in college, not daily.  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      Tobacco: pipe in 1980s      Alcohol: none      Exercise: joined [employer]; going 2-3 times per week.        Seatbelt:  100%   Social Determinants of Health   Financial Resource Strain: Low Risk  (12/09/2022)   Overall Financial Resource Strain (CARDIA)    Difficulty of Paying Living Expenses: Not hard at all  Food Insecurity: No Food Insecurity (12/09/2022)   Hunger Vital Sign    Worried About Running Out of Food in the Last Year: Never true    Ran Out of Food in the Last Year: Never true  Transportation Needs: No Transportation Needs (12/09/2022)   PRAPARE - Administrator, Civil Service (Medical): No    Lack of Transportation (Non-Medical): No  Physical Activity: Inactive (12/09/2022)   Exercise Vital Sign    Days of Exercise per Week: 0 days    Minutes of Exercise per Session: 0 min  Stress: No Stress Concern Present (12/09/2022)   [employer] of Occupational Health - Occupational Stress Questionnaire    Feeling of Stress : Not at all  Social Connections: Socially Integrated (12/09/2022)   Social Connection and Isolation Panel [NHANES]    Frequency of Communication with Friends and Family: More than three times a week    Frequency of Social Gatherings with Friends and Family: More than three times a week    Attends Religious Services: More than 4 times per year    Active Member of [employer] or Organizations: Yes    Attends Engineer, structural: More than 4 times per year    Marital Status: Married   Family History  Problem Relation Age of Onset   Cancer Father 3       prostate cancer   Diabetes Father    Heart disease Father 50       AMI late 56s   Cancer Mother 85       Breast cancer   Heart disease Mother         CABG at age 81   Diabetes Brother    Heart disease Brother        AMI x 2; CABG   Hyperlipidemia Brother    Scheduled Meds:  arformoterol  15 mcg Nebulization BID   budesonide (PULMICORT) nebulizer solution  0.25 mg Nebulization BID   Chlorhexidine Gluconate Cloth  6 each Topical Daily   dexamethasone (DECADRON) injection  6 mg Intravenous Q24H   heparin  5,000 Units Subcutaneous Q8H   mouth rinse  15 mL Mouth Rinse 4 times per day   revefenacin  175 mcg Nebulization Daily   Continuous Infusions: PRN Meds:.docusate sodium, guaiFENesin, ipratropium-albuterol, mouth rinse, polyethylene glycol Medications Prior to Admission:  Prior to  Admission medications   Medication Sig Start Date End Date Taking? Authorizing Provider  albuterol (VENTOLIN HFA) 108 (90 Base) MCG/ACT inhaler Inhale 1-2 puffs into the lungs every 6 (six) hours as needed for wheezing or shortness of breath. 11/07/22  Yes Charlott Holler, MD  ascorbic acid (VITAMIN C) 500 MG tablet Take 500 mg by mouth daily.   Yes [provider]  aspirin EC 81 MG tablet Take 81 mg by mouth every morning.   Yes [provider]  atorvastatin (LIPITOR) 10 MG tablet Take 1 tablet (10 mg total) by mouth daily. 04/30/22  Yes Sagardia, Eilleen Kempf, MD  azelastine (ASTELIN) 0.1 % nasal spray Place 2 sprays into both nostrils 2 (two) times daily. Use in each nostril as directed Patient taking differently: Place 2 sprays into both nostrils 2 (two) times daily as needed for rhinitis or allergies (congestion). Use in each nostril as directed 12/25/21  Yes Ferol Luz, MD  Brimonidine Tartrate (LUMIFY) 0.025 % SOLN Place 1 drop into both eyes every Sunday.   Yes [provider]  famotidine (PEPCID) 20 MG tablet Take 1 tablet (20 mg total) by mouth 2 (two) times daily. 04/30/22  Yes Sagardia, Eilleen Kempf, MD  levocetirizine (XYZAL) 5 MG tablet TAKE 1 TABLET BY MOUTH ONCE DAILY IN THE EVENING 07/09/22  Yes Ferol Luz, MD   loperamide (IMODIUM) 2 MG capsule Take 1 capsule (2 mg total) by mouth 4 (four) times daily as needed for diarrhea or loose stools. 06/07/22  Yes Long, Arlyss Repress, MD  Naphazoline HCl (CLEAR EYES OP) Place 1 drop into both eyes daily.   Yes [provider]  nitroGLYCERIN (NITROSTAT) 0.4 MG SL tablet Place 0.4 mg under the tongue every 5 (five) minutes as needed for chest pain.   Yes [provider]  riTUXimab (RITUXAN IV) Inject into the vein.   Yes [provider]  Testosterone Enanthate (XYOSTED) 75 MG/0.5ML SOAJ Inject 75 mg into the skin once a week. 10/21/22  Yes Sagardia, Eilleen Kempf, MD  Vibegron (GEMTESA) 75 MG TABS Take 1 tablet (75 mg total) by mouth daily at 6 (six) AM. 01/23/23  Yes Sagardia, Eilleen Kempf, MD   No Known Allergies Review of Systems  Constitutional:  Positive for fatigue.  Respiratory:  Positive for shortness of breath.   Gastrointestinal:  Negative for nausea and vomiting.  Neurological:  Positive for weakness.  All other systems reviewed and are negative.   Physical Exam Vitals and nursing note reviewed.  Constitutional:      General: He is not in acute distress.    Appearance: He is ill-appearing.     Comments: Frail appearing  Pulmonary:     Effort: No respiratory distress.     Comments: On bipap Skin:    General: Skin is warm and dry.  Neurological:     Mental Status: He is alert and oriented to person, place, and time.     Motor: Weakness present.  Psychiatric:        Attention and Perception: Attention normal.        Behavior: Behavior is cooperative.        Cognition and Memory: Cognition and memory normal.     Vital Signs: BP 115/74   Pulse (!) 106   Temp (!) 96.9 F (36.1 C) (Axillary)   Resp (!) 29   Ht 5\' 5"  (1.651 m)   Wt 44.8 kg   SpO2 100%   BMI 16.44 kg/m  Pain Scale: 0-10  Pain Score: 0-No pain   SpO2: SpO2: 100 % O2 Device:SpO2: 100 % O2 Flow Rate: .   IO: Intake/output summary:   Intake/Output Summary (Last 24 hours) at 02/05/2023 3220 Last data filed at 02/05/2023 0400 Gross per 24 hour  Intake 933.3 ml  Output 300 ml  Net 633.3 ml    LBM: Last BM Date :  (PTA) Baseline Weight: Weight: 45 kg Most recent weight: Weight: 44.8 kg     Palliative Assessment/Data: PPS 30%     Time In: 0840 Time Out: 1010 Time Total: 90 minutes  Greater than 50%  of this time was spent counseling and coordinating care related to the above assessment and plan.  Signed by: Haskel Khan, NP   Please contact Palliative Medicine Team phone at [phone] for questions and concerns.  For individual provider: See Amion  *Portions of this note are a verbal dictation therefore any spelling and/or grammatical errors are due to the "Dragon Medical One" system interpretation.

## 2023-02-05 NOTE — Progress Notes (Signed)
Venous lower ext duplex  has been completed. Refer to [facility] under chart review to view preliminary results.   02/05/2023  12:06 PM Earl Thomas, [name]

## 2023-02-05 NOTE — Progress Notes (Signed)
eLink Physician-Brief Progress Note Patient Name: Earl Thomas DOB: [birth year] MRN: [id number]   Date of Service  02/05/2023  HPI/Events of Note  Patient is feeling extremely anxious.  eICU Interventions  Ativan 0.5 mg po x 1 PRN anxiety ordered.        Migdalia Dk 02/05/2023, 9:50 PM

## 2023-02-05 NOTE — Progress Notes (Signed)
eLink Physician-Brief Progress Note Patient Name: Earl Thomas DOB: 1951-01-20 MRN: [id number]   Date of Service  02/05/2023  HPI/Events of Note  Lactic acid  2.8  eICU Interventions  Repeat lactic acid at 5 am.        Migdalia Dk 02/05/2023, 12:15 AM

## 2023-02-05 NOTE — Progress Notes (Signed)
21:20- Patient stated he was anxious, periods of tachypnea in the 40s with bipap on. ELink notified.   One time dose of 0.5mg  of ativan ordered and received.

## 2023-02-05 NOTE — Plan of Care (Signed)

## 2023-02-06 DIAGNOSIS — R0603 Acute respiratory distress: Secondary | ICD-10-CM | POA: Diagnosis not present

## 2023-02-06 LAB — GLUCOSE, CAPILLARY
Glucose-Capillary: 166 mg/dL — ABNORMAL HIGH (ref 70–99)
Glucose-Capillary: 150 mg/dL — ABNORMAL HIGH (ref 70–99)
Glucose-Capillary: 127 mg/dL — ABNORMAL HIGH (ref 70–99)
Glucose-Capillary: 182 mg/dL — ABNORMAL HIGH (ref 70–99)
Glucose-Capillary: 190 mg/dL — ABNORMAL HIGH (ref 70–99)
Glucose-Capillary: 151 mg/dL — ABNORMAL HIGH (ref 70–99)

## 2023-02-06 MED ORDER — DEXTROMETHORPHAN POLISTIREX ER 30 MG/5ML PO SUER
30.0000 mg | Freq: Every evening | ORAL | Status: DC | PRN
  Administered 2023-02-06: 30 mg via ORAL
  Filled 2023-02-06 (×2): qty 5

## 2023-02-06 NOTE — Progress Notes (Signed)
[facility] Liaison Note  This patient is currently enrolled in [facility] outpatient-based palliative care.   [facility] will continue to follow for discharge disposition.   Please call for any outpatient based palliative care related questions or concerns.   Thank you.   Dionicio Stall, [facility] Liaison [phone]

## 2023-02-06 NOTE — Progress Notes (Signed)
Brief Palliative Medicine Progress Note:  PMT following peripherally for needs/decline:  Medical records reviewed including progress notes, labs, imaging. Patient clinically improving, though remains high risk for decline.   Trial of HHFNC and clear liquid diet today. Patient is planning discharge to [facility] - he is already followed by [facility] outpatient Palliative Care. Notified [facility] liaison that he is admitted - he would benefit from follow up sooner than later if/when discharged. Patient would be hospice appropriate if/when agreeable. He was not interested in hospice during GOC on 7/31.  Goals are clear for treating the treatable at this time. If respiratory decline, he would be ok with short term trial of intubation but not indefinite life support. If pulseless/not breathing would want peaceful, natural passing - DNR/DNI in place. PMT will follow peripherally and visit with patient and family incrementally for goals of care discussions as appropriate and based on clinical course.  If there are any imminent needs please call the service directly.   Thank you for allowing PMT to assist in the care of this patient.  Regan Mcbryar M. Katrinka Blazing [facility] Palliative Medicine Team Team Phone: [phone] NO CHARGE

## 2023-02-06 NOTE — Progress Notes (Signed)
Pt taken off bipap and placed on heated HFNC per MD, 40L/40%, sats are 100%. Pt seems to be tolerating it well, bipap on s/b @ bedside if needed. RT will continue to monitor.

## 2023-02-06 NOTE — Progress Notes (Addendum)
   NAME:  Earl Thomas, MRN:  [id number], DOB:  1950/09/22, LOS: 2 ADMISSION DATE:  02/04/2023  History of Present Illness:  72 year old male with RA ILD and pulmonary cachexia status post PEG admitted for acute hypoxic respiratory failure and found to have COVID-pneumonia.  Significant Hospital Events:  Admitted to ICU for hypoxic respiratory failure  Interim History / Subjective:  Feeling better today, breathing difficulty 3/10.  Objective   Blood pressure 97/68, pulse 97, temperature (!) 96.8 F (36 C), temperature source Axillary, resp. rate (!) 24, height 5\' 5"  (1.651 m), weight 45.2 kg, SpO2 100%.    FiO2 (%):  [30 %-35 %] 30 % Pressure Support:  [13 cmH20] 13 cmH20   Intake/Output Summary (Last 24 hours) at 02/06/2023 0826 Last data filed at 02/06/2023 0600 Gross per 24 hour  Intake 586.17 ml  Output 800 ml  Net -213.83 ml   Filed Weights   02/04/23 2122 02/05/23 0500 02/06/23 0500  Weight: 44.8 kg 44.8 kg 45.2 kg    Examination: Ill, cachectic-appearing Heart rate a bit fast, rhythm normal, strong radials Breathing is tachypneic on BiPAP, decreased breath sounds throughout Skin warm and dry Alert, follows instructions  Resolved Hospital Problem list   Resolved Problems:   * No resolved hospital problems. *   Assessment & Plan:  Principal Problem:   Respiratory distress  Hypoxic and hypercarbic respiratory failure due to COVID pneumonia End-stage ILD - Wean from BiPAP to HFNC - Dexamethasone - Triple therapy nebulizers - Tocilizumab - S/p 1 dose broad-spectrum abx, afebrile without leukocytosis - LE venous dopplers negative for VTE  Pulmonary cachexia Gastrostomy tube dependent - Continue tube feeds  Best practice (daily eval):  Diet/type: tubefeeds DVT prophylaxis: prophylactic heparin  GI prophylaxis: N/A Lines: N/A Foley:  N/A Code Status:  limited, brief trial of intubation, but no CPR if pulseless  Marrianne Mood MD 02/06/2023, 8:26  AM  Pager: [phone]

## 2023-02-06 NOTE — Progress Notes (Addendum)
eLink Physician-Brief Progress Note Patient Name: Earl Thomas DOB: 10/28/1950 MRN: [id number]   Date of Service  02/06/2023  HPI/Events of Note  COVID + patient w/ underlying RA-ILD. He is on [facility] and has been coughing a lot. He has an order for robitussin, but per Bedside RN he still can't  stop coughing.  No significant secretions as per bedside RN  eICU Interventions  Dextromethorphan ordered prn     Intervention Category Minor Interventions: Routine modifications to care plan (e.g. PRN medications for pain, fever)  Rosalie Gums Nadya Hopwood 02/06/2023, 10:48 PM

## 2023-02-06 NOTE — TOC Initial Note (Addendum)
Transition of Care [facility] ) - Initial/Assessment Note    Patient Details  Name: Earl Thomas MRN: [id number] Date of Birth: 12/22/1950  Transition of Care [facility]) CM/SW Contact:    Lorri Frederick, LCSW Phone Number: 02/06/2023, 11:38 AM  Clinical Narrative:     Pt covid +, CSW spoke with him by phone.  Discussed LTAC recommendation and choice of LTAC facility.  He was agreeable to speak with reps from [facility] and [facility] in order to choose. Pt from home with wife  Luster Landsberg, permission given to speak with her.  Outpt palliative services in place, unsure which agency.  CSW sent message to Jen[facility], Emily/[facility] requesting they speak to pt.          1335: CSW received message that pt has chosen [facility].  CSW spoke with pt to confirm.  Pt reports he has not made decision on LTAC choice, wants his wife to speak to one of the options before deciding.     Expected Discharge Plan: Long Term Acute Care (LTAC) Barriers to Discharge: Continued Medical Work up, Other (must enter comment) (LTAC bed offer and insurance auth)   Patient Goals and CMS Choice     Choice offered to / list presented to : Patient      Expected Discharge Plan and Services In-house Referral: Clinical Social Work   Post Acute Care Choice: Long Term Acute Care (LTAC) Living arrangements for the past 2 months: Single Family Home                                      Prior Living Arrangements/Services Living arrangements for the past 2 months: Single Family Home Lives with:: Spouse Patient language and need for interpreter reviewed:: Yes        Need for Family Participation in Patient Care: No (Comment) Care giver support system in place?: Yes (comment) Current home services: Other (comment) (outpt palliative visit 2x month, unsure which agency) Criminal Activity/Legal Involvement Pertinent to Current Situation/Hospitalization: No - Comment as needed  Activities of Daily Living      Permission  Sought/Granted                  Emotional Assessment Appearance:: Appears stated age Attitude/Demeanor/Rapport: Engaged Affect (typically observed): Appropriate, Pleasant Orientation: : Oriented to Self, Oriented to Place, Oriented to  Time, Oriented to Situation      Admission diagnosis:  Respiratory distress [R06.03] Acute respiratory failure with hypoxia (HCC) [J96.01] COVID-19 [U07.1] Patient Active Problem List   Diagnosis Date Noted   Respiratory distress 02/04/2023   Lower urinary tract symptoms 01/20/2023   Hemorrhage of rectum and anus 10/17/2022   Personal history of colonic polyps 10/17/2022   Screening for malignant neoplasm of colon 10/17/2022   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 (HCC) 10/17/2022   Rheumatoid arthritis (HCC) 10/17/2022   Allergic rhinitis 10/11/2022   Chronic hypoxic respiratory failure (HCC) 08/30/2022   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   Chronic bronchitis (HCC) 06/17/2022   Protein-calorie malnutrition, severe (HCC) 06/17/2022   GERD (gastroesophageal reflux disease) 01/19/2022   Chronic diastolic CHF (congestive heart failure) (HCC) 01/19/2022   Arthritis 12/25/2021   Hyperlipidemia 12/25/2021   Chronic fatigue syndrome 12/04/2021   Other long term (current) drug therapy 12/04/2021   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   Arthralgia of temporomandibular joint 12/04/2021   Abnormal weight loss 12/04/2021   Family history  of prostate cancer 12/04/2021   Aortic atherosclerosis (HCC) 12/04/2021   Skin lesion of chest wall 12/04/2021   Interstitial lung disease (HCC) 07/16/2019   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 [place] [address] Phone: [phone] Fax: [phone]  [facility] [place], [place] - [facility] AT Portal to Registered [address] [place] [address] Phone: [phone] Fax: [phone]  [facility] [address] [place], [place] - [address] [place] [address] Phone: [phone] Fax: [phone]     Social Determinants of Health (SDOH) Social History: SDOH Screenings   Food Insecurity: No Food Insecurity (12/09/2022)  Housing: Low Risk  (12/09/2022)  Transportation Needs: No Transportation Needs (12/09/2022)  Utilities: Not At Risk (12/09/2022)  Alcohol Screen: Low Risk  (12/09/2022)  Depression (PHQ2-9): Low Risk  (01/20/2023)  Financial Resource Strain: Low Risk  (12/09/2022)  Physical Activity: Inactive (12/09/2022)  Social Connections: Socially Integrated (12/09/2022)  Stress: No Stress Concern Present (12/09/2022)  Tobacco Use: Medium Risk (01/20/2023)   SDOH Interventions:     Readmission Risk Interventions    10/14/2022    2:38 PM  Readmission Risk Prevention Plan  Transportation Screening Complete  PCP or Specialist Appt within 5-7 Days Complete  Home Care Screening Complete  Medication Review (RN CM) Complete

## 2023-02-06 NOTE — Progress Notes (Signed)
   02/06/23 2309  BiPAP/CPAP/SIPAP  BiPAP/CPAP/SIPAP Pt Type Adult  BiPAP/CPAP/SIPAP V60  Mask Type Full face mask  Mask Size Small  Set Rate 10 breaths/min  Respiratory Rate 32 breaths/min  IPAP 14 cmH20  EPAP 6 cmH2O  FiO2 (%) 40 %  Minute Ventilation 15  Leak 9  Peak Inspiratory Pressure (PIP) 16  Tidal Volume (Vt) 465  Patient Home Equipment No  Auto Titrate No  Press High Alarm 25 cmH2O  Press Low Alarm 5 cmH2O  Nasal massage performed  (foam placed over nose bridge)  Oxygen Percent 40 %  BiPAP/CPAP /SiPAP Vitals  Pulse Rate 97  Resp (!) 30  SpO2 100 %  MEWS Score/Color  MEWS Score 2  MEWS Score Color Yellow

## 2023-02-07 ENCOUNTER — Inpatient Hospital Stay
Admission: AD | Admit: 2023-02-07 | Discharge: 2023-02-25 | Disposition: A | Discharge status: Rehabilitation Facility | Payer: Self-pay | Source: Other Acute Inpatient Hospital | Attending: Internal Medicine | Admitting: Internal Medicine

## 2023-02-07 ENCOUNTER — Other Ambulatory Visit (HOSPITAL_COMMUNITY): Payer: Self-pay

## 2023-02-07 DIAGNOSIS — Z681 Body mass index (BMI) 19 or less, adult: Secondary | ICD-10-CM | POA: Diagnosis not present

## 2023-02-07 DIAGNOSIS — E43 Unspecified severe protein-calorie malnutrition: Secondary | ICD-10-CM | POA: Diagnosis not present

## 2023-02-07 DIAGNOSIS — R531 Weakness: Secondary | ICD-10-CM | POA: Diagnosis not present

## 2023-02-07 DIAGNOSIS — D352 Benign neoplasm of pituitary gland: Secondary | ICD-10-CM | POA: Diagnosis not present

## 2023-02-07 DIAGNOSIS — A0472 Enterocolitis due to Clostridium difficile, not specified as recurrent: Secondary | ICD-10-CM | POA: Diagnosis not present

## 2023-02-07 DIAGNOSIS — K219 Gastro-esophageal reflux disease without esophagitis: Secondary | ICD-10-CM | POA: Diagnosis not present

## 2023-02-07 DIAGNOSIS — Z9981 Dependence on supplemental oxygen: Secondary | ICD-10-CM | POA: Diagnosis not present

## 2023-02-07 DIAGNOSIS — Z931 Gastrostomy status: Secondary | ICD-10-CM | POA: Diagnosis not present

## 2023-02-07 DIAGNOSIS — I700 Atherosclerosis of aorta: Secondary | ICD-10-CM | POA: Diagnosis not present

## 2023-02-07 DIAGNOSIS — D849 Immunodeficiency, unspecified: Secondary | ICD-10-CM | POA: Diagnosis not present

## 2023-02-07 DIAGNOSIS — R2689 Other abnormalities of gait and mobility: Secondary | ICD-10-CM | POA: Diagnosis not present

## 2023-02-07 DIAGNOSIS — U099 Post covid-19 condition, unspecified: Secondary | ICD-10-CM | POA: Diagnosis not present

## 2023-02-07 DIAGNOSIS — J1282 Pneumonia due to coronavirus disease 2019: Secondary | ICD-10-CM | POA: Diagnosis not present

## 2023-02-07 DIAGNOSIS — Z85038 Personal history of other malignant neoplasm of large intestine: Secondary | ICD-10-CM | POA: Diagnosis not present

## 2023-02-07 DIAGNOSIS — Z7952 Long term (current) use of systemic steroids: Secondary | ICD-10-CM | POA: Diagnosis not present

## 2023-02-07 DIAGNOSIS — M069 Rheumatoid arthritis, unspecified: Secondary | ICD-10-CM | POA: Diagnosis not present

## 2023-02-07 DIAGNOSIS — I110 Hypertensive heart disease with heart failure: Secondary | ICD-10-CM | POA: Diagnosis not present

## 2023-02-07 DIAGNOSIS — R6520 Severe sepsis without septic shock: Secondary | ICD-10-CM | POA: Diagnosis not present

## 2023-02-07 DIAGNOSIS — J982 Interstitial emphysema: Secondary | ICD-10-CM | POA: Diagnosis not present

## 2023-02-07 DIAGNOSIS — R000 Tachycardia, unspecified: Secondary | ICD-10-CM | POA: Diagnosis not present

## 2023-02-07 DIAGNOSIS — M0510 Rheumatoid lung disease with rheumatoid arthritis of unspecified site: Secondary | ICD-10-CM | POA: Diagnosis not present

## 2023-02-07 DIAGNOSIS — U071 COVID-19: Secondary | ICD-10-CM | POA: Diagnosis not present

## 2023-02-07 DIAGNOSIS — G9332 Myalgic encephalomyelitis/chronic fatigue syndrome: Secondary | ICD-10-CM | POA: Diagnosis not present

## 2023-02-07 DIAGNOSIS — Z736 Limitation of activities due to disability: Secondary | ICD-10-CM | POA: Diagnosis not present

## 2023-02-07 DIAGNOSIS — J9601 Acute respiratory failure with hypoxia: Secondary | ICD-10-CM | POA: Diagnosis not present

## 2023-02-07 DIAGNOSIS — Z7901 Long term (current) use of anticoagulants: Secondary | ICD-10-CM | POA: Diagnosis not present

## 2023-02-07 DIAGNOSIS — J9620 Acute and chronic respiratory failure, unspecified whether with hypoxia or hypercapnia: Secondary | ICD-10-CM | POA: Diagnosis not present

## 2023-02-07 DIAGNOSIS — G7281 Critical illness myopathy: Secondary | ICD-10-CM | POA: Diagnosis not present

## 2023-02-07 DIAGNOSIS — J849 Interstitial pulmonary disease, unspecified: Secondary | ICD-10-CM | POA: Diagnosis not present

## 2023-02-07 DIAGNOSIS — R252 Cramp and spasm: Secondary | ICD-10-CM | POA: Diagnosis not present

## 2023-02-07 DIAGNOSIS — Z87891 Personal history of nicotine dependence: Secondary | ICD-10-CM | POA: Diagnosis not present

## 2023-02-07 DIAGNOSIS — I5032 Chronic diastolic (congestive) heart failure: Secondary | ICD-10-CM | POA: Diagnosis not present

## 2023-02-07 DIAGNOSIS — J9621 Acute and chronic respiratory failure with hypoxia: Secondary | ICD-10-CM | POA: Diagnosis not present

## 2023-02-07 DIAGNOSIS — Z942 Lung transplant status: Secondary | ICD-10-CM | POA: Diagnosis not present

## 2023-02-07 DIAGNOSIS — Z743 Need for continuous supervision: Secondary | ICD-10-CM | POA: Diagnosis not present

## 2023-02-07 DIAGNOSIS — J441 Chronic obstructive pulmonary disease with (acute) exacerbation: Secondary | ICD-10-CM | POA: Diagnosis not present

## 2023-02-07 DIAGNOSIS — J9600 Acute respiratory failure, unspecified whether with hypoxia or hypercapnia: Secondary | ICD-10-CM | POA: Diagnosis not present

## 2023-02-07 LAB — HEMOGLOBIN A1C
Hgb A1c MFr Bld: 6.1 % — ABNORMAL HIGH (ref 4.8–5.6)
Mean Plasma Glucose: 128.37 mg/dL

## 2023-02-07 LAB — GLUCOSE, CAPILLARY
Glucose-Capillary: 185 mg/dL — ABNORMAL HIGH (ref 70–99)
Glucose-Capillary: 158 mg/dL — ABNORMAL HIGH (ref 70–99)
Glucose-Capillary: 102 mg/dL — ABNORMAL HIGH (ref 70–99)

## 2023-02-07 MED ORDER — ADULT MULTIVITAMIN W/MINERALS CH: 1.0000 | ORAL_TABLET | Freq: Every day | ORAL | Status: DC

## 2023-02-07 MED ORDER — DOCUSATE SODIUM 100 MG PO CAPS: 100.0000 mg | ORAL_CAPSULE | Freq: Two times a day (BID) | ORAL | Status: DC | PRN

## 2023-02-07 MED ORDER — BUDESONIDE 0.25 MG/2ML IN SUSP: 0.2500 mg | Freq: Two times a day (BID) | RESPIRATORY_TRACT | Status: DC

## 2023-02-07 MED ORDER — VITAMIN B-1 100 MG PO TABS: 100.0000 mg | ORAL_TABLET | Freq: Every day | ORAL | Status: DC

## 2023-02-07 MED ORDER — DEXTROMETHORPHAN POLISTIREX ER 30 MG/5ML PO SUER: 30.0000 mg | Freq: Every evening | ORAL | Status: DC | PRN

## 2023-02-07 MED ORDER — INSULIN ASPART 100 UNIT/ML IJ SOLN
0.0000 [IU] | INTRAMUSCULAR | Status: DC
  Administered 2023-02-07: 2 [IU] via SUBCUTANEOUS

## 2023-02-07 MED ORDER — OSMOLITE 1.5 CAL PO LIQD: 1000.0000 mL | ORAL | Status: DC

## 2023-02-07 MED ORDER — IPRATROPIUM-ALBUTEROL 0.5-2.5 (3) MG/3ML IN SOLN: 3.0000 mL | RESPIRATORY_TRACT | Status: DC | PRN

## 2023-02-07 MED ORDER — PROSOURCE TF20 ENFIT COMPATIBL EN LIQD: 60.0000 mL | Freq: Every day | ENTERAL | Status: DC

## 2023-02-07 MED ORDER — INSULIN ASPART 100 UNIT/ML IJ SOLN: 0.0000 [IU] | INTRAMUSCULAR | Status: DC

## 2023-02-07 MED ORDER — SODIUM PHOSPHATES 45 MMOLE/15ML IV SOLN
15.0000 mmol | Freq: Once | INTRAVENOUS | Status: AC
  Administered 2023-02-07: 15 mmol via INTRAVENOUS
  Filled 2023-02-07: qty 5

## 2023-02-07 MED ORDER — GUAIFENESIN 100 MG/5ML PO LIQD: 5.0000 mL | ORAL | Status: DC | PRN

## 2023-02-07 MED ORDER — ARFORMOTEROL TARTRATE 15 MCG/2ML IN NEBU: 15.0000 ug | INHALATION_SOLUTION | Freq: Two times a day (BID) | RESPIRATORY_TRACT | Status: DC

## 2023-02-07 MED ORDER — DEXAMETHASONE SODIUM PHOSPHATE 10 MG/ML IJ SOLN: 6.0000 mg | INTRAMUSCULAR | Status: AC

## 2023-02-07 MED ORDER — DIATRIZOATE MEGLUMINE & SODIUM 66-10 % PO SOLN
30.0000 mL | Freq: Once | ORAL | Status: AC
  Administered 2023-02-07: 30 mL

## 2023-02-07 MED ORDER — POLYETHYLENE GLYCOL 3350 17 G PO PACK: 17.0000 g | PACK | Freq: Every day | ORAL | Status: DC | PRN

## 2023-02-07 MED ORDER — REVEFENACIN 175 MCG/3ML IN SOLN: 175.0000 ug | Freq: Every day | RESPIRATORY_TRACT | Status: DC

## 2023-02-07 NOTE — Progress Notes (Signed)
Pt taken off bipap and placed on heated HFNC per MD. Pt tolerating it well at this time. RT will continue to monitor.

## 2023-02-07 NOTE — Discharge Summary (Signed)
Physician Discharge Summary         Patient ID: Earl Thomas MRN: [id number] DOB/AGE: 08-May-1951 72 y.o.  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  Treated with IV dexamethasone, tocilizumab.  ICS, LAMA, LABA via neb.  Continuous BiPAP initially.  Breathing improved over the course of a couple of days and he was stable on heated high flow nasal cannula during the day and BiPAP at night.  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  He has gastrostomy tube in place and feeding was supplemented through that during hospitalization.  Discharged to [facility] for ongoing care and hopeful for weaning off BiPAP but very low pulmonary reserve and he may not return to his baseline prior to this hospitalization.  Discharge Plan by Active Problems    End-stage RA ILD - Continue nightly BiPAP and daily heated high flow nasal cannula - Wean to home O2 of 4 L/min as tolerated - Continue dexamethasone through August 10 - Triple therapy nebulizers with prn DuoNebs - Rituximab for RA ILD  Pulmonary cachexia - Gastrostomy tube for feeding supplementation  Significant Hospital tests/ studies  EXAM: PORTABLE CHEST 1 VIEW   COMPARISON:  Chest radiograph 06/07/2022, CT chest 12/30/2022   FINDINGS: The cardiomediastinal silhouette is grossly stable   Extensive interstitial lung  disease is again seen throughout both lungs, not significantly changed. There is no convincing acute airspace opacity. There is no pleural effusion or pneumothorax   There is no acute osseous abnormality. A presumed gastrostomy tube is seen projecting over the left upper quadrant.   IMPRESSION: Extensive interstitial lung disease without convincing acute airspace opacity.  ---------------------------------  Lower Venous DVT Study   Patient Name:  MOHAB ASHBY  Date of Exam:   02/05/2023  Medical Rec #: [id number]             Accession #:    [id number]  Date of Birth: [birth year]             Patient Gender: M  Patient Age:   72 years  Exam Location[facility]  [facility]  Procedure:      VAS [place] LOWER EXTREMITY VENOUS (DVT)  Referring Phys: Levon Hedger   Summary:  BILATERAL:  - No evidence of deep vein thrombosis seen in the lower extremities,  bilaterally.  -No evidence of popliteal cyst, bilaterally. Culture data/antimicrobials    Results for orders placed or performed during the hospital encounter of 02/04/23  SARS Coronavirus 2 by RT PCR (hospital order, performed in [facility] hospital lab) *cepheid single result test* Anterior Nasal Swab     Status: Abnormal   Collection Time: 02/04/23  4:00 PM   Specimen: Anterior Nasal Swab  Result Value Ref Range Status   SARS Coronavirus 2 by RT PCR POSITIVE (A) NEGATIVE Final    Comment: Performed at [facility], [address], [place], [place] [address]  Respiratory (~20 pathogens) panel  by PCR     Status: None   Collection Time: 02/04/23  4:00 PM   Specimen: Nasopharyngeal Swab; Respiratory  Result Value Ref Range Status   Adenovirus NOT DETECTED NOT DETECTED Final   Coronavirus 229E NOT DETECTED NOT DETECTED Final    Comment: (NOTE) The Coronavirus on the Respiratory Panel, DOES NOT test for the novel  Coronavirus (2019 nCoV)    Coronavirus HKU1 NOT DETECTED NOT DETECTED Final   Coronavirus NL63 NOT DETECTED  NOT DETECTED Final   Coronavirus OC43 NOT DETECTED NOT DETECTED Final   Metapneumovirus NOT DETECTED NOT DETECTED Final   Rhinovirus / Enterovirus NOT DETECTED NOT DETECTED Final   Influenza A NOT DETECTED NOT DETECTED Final   Influenza B NOT DETECTED NOT DETECTED Final   Parainfluenza Virus 1 NOT DETECTED NOT DETECTED Final   Parainfluenza Virus 2 NOT DETECTED NOT DETECTED Final   Parainfluenza Virus 3 NOT DETECTED NOT DETECTED Final   Parainfluenza Virus 4 NOT DETECTED NOT DETECTED Final   Respiratory Syncytial Virus NOT DETECTED NOT DETECTED Final   Bordetella pertussis NOT DETECTED NOT DETECTED Final   Bordetella Parapertussis NOT DETECTED NOT DETECTED Final   Chlamydophila pneumoniae NOT DETECTED NOT DETECTED Final   Mycoplasma pneumoniae NOT DETECTED NOT DETECTED Final    Comment: Performed at [facility], [address], [place], [place] [address]  Blood culture (routine x 2)     Status: None (Preliminary result)   Collection Time: 02/04/23  6:08 PM   Specimen: BLOOD  Result Value Ref Range Status   Specimen Description BLOOD SITE NOT SPECIFIED  Final   Special Requests   Final    BOTTLES DRAWN AEROBIC AND ANAEROBIC Blood Culture adequate volume   Culture   Final    NO GROWTH 3 DAYS Performed at [facility], [address], [place], [place] [address]    Report Status PENDING  Incomplete  MRSA Next Gen by PCR, Nasal     Status: None   Collection Time: 02/04/23 10:02 PM   Specimen: Nasal Mucosa; Nasal Swab  Result Value Ref Range Status   MRSA by PCR Next Gen NOT DETECTED NOT DETECTED Final    Comment: (NOTE) The GeneXpert MRSA Assay (FDA approved for NASAL specimens only), is one component of a comprehensive MRSA colonization surveillance program. It is not intended to diagnose MRSA infection nor to guide or monitor treatment for MRSA infections. Test performance is not FDA approved in patients less than 28 years old. Performed at [facility],  [address] [place], [place] [address]         Discharge Exam: BP 96/66   Pulse 97   Temp (!) 97.2 F (36.2 C) (Axillary)   Resp 18   Ht 5\' 5"  (1.651 m)   Wt 45.2 kg   SpO2 100%   BMI 16.58 kg/m   Feeling better day by day  No distress Heart rate is slightly tachycardic, rhythm is regular, radial pulses are strong Breathing is tachypneic but unlabored on BiPAP Skin is warm and dry Alert and oriented  Labs at discharge   Lab Results  Component Value Date   CREATININE 0.82 02/07/2023   BUN 18 02/07/2023   NA 137 02/07/2023   K 4.6 02/07/2023   CL 97 (L) 02/07/2023   CO2 33 (H) 02/07/2023   Lab Results  Component Value Date   WBC 7.1 02/07/2023   HGB 15.4 02/07/2023   HCT 49.8 02/07/2023   MCV  88.1 02/07/2023   PLT 117 (L) 02/07/2023   Lab Results  Component Value Date   ALT 33 02/04/2023   AST 41 02/04/2023   ALKPHOS 66 02/04/2023   BILITOT 1.2 02/04/2023   Lab Results  Component Value Date   INR 1.0 10/03/2022   INR 1.1 01/19/2022    Current radiological studies    No results found.  Disposition:    Discharge disposition: 51-Hospice/Medical Facility       Discharge Instructions     Diet - low sodium heart healthy   Complete by: As directed    Increase activity slowly   Complete by: As directed        Allergies as of 02/07/2023   No Known Allergies      Medication List     STOP taking these medications    CLEAR EYES OP   loperamide 2 MG capsule Commonly known as: IMODIUM       TAKE these medications    albuterol 108 (90 Base) MCG/ACT inhaler Commonly known as: VENTOLIN HFA Inhale 1-2 puffs into the lungs every 6 (six) hours as needed for wheezing or shortness of breath.   arformoterol 15 MCG/2ML Nebu Commonly known as: BROVANA Take 2 mLs (15 mcg total) by nebulization 2 (two) times daily.   ascorbic acid 500 MG tablet Commonly known as: VITAMIN C Take 500 mg by mouth daily.   aspirin EC 81 MG tablet Take 81  mg by mouth every morning.   atorvastatin 10 MG tablet Commonly known as: LIPITOR Take 1 tablet (10 mg total) by mouth daily.   azelastine 0.1 % nasal spray Commonly known as: ASTELIN Place 2 sprays into both nostrils 2 (two) times daily. Use in each nostril as directed What changed:  when to take this reasons to take this   budesonide 0.25 MG/2ML nebulizer solution Commonly known as: PULMICORT Take 2 mLs (0.25 mg total) by nebulization 2 (two) times daily.   dexamethasone 10 MG/ML injection Commonly known as: DECADRON Inject 0.6 mLs (6 mg total) into the vein daily for 7 days. Start taking on: February 08, 2023   dextromethorphan 30 MG/5ML liquid Commonly known as: DELSYM Take 5 mLs (30 mg total) by mouth at bedtime as needed for cough.   docusate sodium 100 MG capsule Commonly known as: COLACE Take 1 capsule (100 mg total) by mouth 2 (two) times daily as needed for mild constipation.   famotidine 20 MG tablet Commonly known as: PEPCID Take 1 tablet (20 mg total) by mouth 2 (two) times daily.   feeding supplement (OSMOLITE 1.5 CAL) Liqd Place 1,000 mLs into feeding tube continuous.   feeding supplement (PROSource TF20) liquid Place 60 mLs into feeding tube daily. Start taking on: February 08, 2023   Gemtesa 75 MG Tabs Generic drug: Vibegron Take 1 tablet (75 mg total) by mouth daily at 6 (six) AM.   guaiFENesin 100 MG/5ML liquid Commonly known as: ROBITUSSIN Take 5 mLs by mouth every 4 (four) hours as needed for cough or to loosen phlegm.   insulin aspart 100 UNIT/ML injection Commonly known as: novoLOG Inject 0-9 Units into the skin every 4 (four) hours.   ipratropium-albuterol 0.5-2.5 (3) MG/3ML Soln Commonly known as: DUONEB Take 3 mLs by nebulization every 4 (four) hours as needed.   levocetirizine 5 MG tablet Commonly known as: XYZAL TAKE 1 TABLET BY MOUTH ONCE DAILY IN THE EVENING   Lumify 0.025 % Soln Generic drug: Brimonidine Tartrate Place 1 drop  into both  eyes every Sunday.   multivitamin with minerals Tabs tablet Place 1 tablet into feeding tube daily. Start taking on: February 08, 2023   nitroGLYCERIN 0.4 MG SL tablet Commonly known as: NITROSTAT Place 0.4 mg under the tongue every 5 (five) minutes as needed for chest pain.   polyethylene glycol 17 g packet Commonly known as: MIRALAX / GLYCOLAX Take 17 g by mouth daily as needed for moderate constipation.   revefenacin 175 MCG/3ML nebulizer solution Commonly known as: YUPELRI Take 3 mLs (175 mcg total) by nebulization daily. Start taking on: February 08, 2023   RITUXAN IV Inject into the vein.   thiamine 100 MG tablet Commonly known as: Vitamin B-1 Place 1 tablet (100 mg total) into feeding tube daily. Start taking on: February 08, 2023   Xyosted 75 MG/0.5ML Soaj Generic drug: Testosterone Enanthate Inject 75 mg into the skin once a week.         Follow-up appointment   Follow-up with PCP and pulmonology as needed Discharge Condition:    fair  Signed: Thong Feeny 02/07/2023, 1:08 PM

## 2023-02-07 NOTE — Progress Notes (Signed)
Pt transferred to 5E16. VSS, no issues. Receiving RN at bedside and RT. Family with pt as well as all belongings.

## 2023-02-07 NOTE — TOC Progression Note (Signed)
Transition of Care [facility]) - Progression Note    Patient Details  Name: Earl Thomas MRN: [id number] Date of Birth: [birth year]  Transition of Care [facility]) CM/SW Contact  Lorri Frederick, LCSW Phone Number: 02/07/2023, 8:47 AM  Clinical Narrative:   CSW spoke with pt by phone regarding LTAC choice.  [facility].  Team informed.     Expected Discharge Plan: Long Term Acute Care (LTAC) Barriers to Discharge: Continued Medical Work up, Other (must enter comment) (LTAC bed offer and insurance auth)  Expected Discharge Plan and Services In-house Referral: Clinical Social Work   Post Acute Care Choice: Long Term Acute Care (LTAC) Living arrangements for the past 2 months: Single Family Home                                       Social Determinants of Health (SDOH) Interventions SDOH Screenings   Food Insecurity: No Food Insecurity (12/09/2022)  Housing: Low Risk  (12/09/2022)  Transportation Needs: No Transportation Needs (12/09/2022)  Utilities: Not At Risk (12/09/2022)  Alcohol Screen: Low Risk  (12/09/2022)  Depression (PHQ2-9): Low Risk  (01/20/2023)  Financial Resource Strain: Low Risk  (12/09/2022)  Physical Activity: Inactive (12/09/2022)  Social Connections: Socially Integrated (12/09/2022)  Stress: No Stress Concern Present (12/09/2022)  Tobacco Use: Medium Risk (01/20/2023)    Readmission Risk Interventions    10/14/2022    2:38 PM  Readmission Risk Prevention Plan  Transportation Screening Complete  PCP or Specialist Appt within 5-7 Days Complete  Home Care Screening Complete  Medication Review (RN CM) Complete

## 2023-02-07 NOTE — Progress Notes (Signed)
Pharmacy Electrolyte Replacement  Recent Labs:  Recent Labs    02/07/23 0616  K 4.6  MG 2.3  PHOS 2.1*  CREATININE 0.82    Low Critical Values (K </= 2.5, Phos </= 1, Mg </= 1) Present: None  MD Contacted: N/a  Plan: NaPhos 15 mmol IV x1. Repeat Phos tomorrow.   Link Snuffer, PharmD, BCPS, BCCCP Clinical Pharmacist Please refer to [facility] for [facility] Pharmacy numbers 02/07/2023, 8:22 AM

## 2023-02-07 NOTE — Progress Notes (Signed)
   02/07/23 0836  BiPAP/CPAP/SIPAP  BiPAP/CPAP/SIPAP Pt Type Adult  BiPAP/CPAP/SIPAP V60  Mask Type Full face mask  Mask Size Small  Set Rate 10 breaths/min  Respiratory Rate 40 breaths/min  IPAP 16 cmH20  EPAP 6 cmH2O  FiO2 (%) 40 %  Minute Ventilation 20.1  Leak 4  Peak Inspiratory Pressure (PIP) 15  Tidal Volume (Vt) 488  Patient Home Equipment No  Auto Titrate No  Press High Alarm 25 cmH2O  Press Low Alarm 5 cmH2O

## 2023-02-07 NOTE — Discharge Instructions (Addendum)
To Mr. Tregan Read or their caretakers,  They were admitted to [facility] on 02/04/2023 for evaluation and treatment of:  Active Problems:   Interstitial lung disease (HCC)   COVID-19   Chronic hypoxic respiratory failure (HCC)  Principal Problem (Resolved):   Respiratory distress Resolved Problems:   Acute respiratory failure with hypoxia (HCC)  The evaluation suggested respiratory failure due to COVID pneumonia. They were treated with IV steroids and immunotherapy.  They were discharged from the hospital on 02/07/23. I recommend the following after leaving the hospital:   Continue dexamethasone through Saturday August 10. Sliding scale insulin started for glycemic control while on steroids.  Wean off BiPAP as tolerated, patient did well on heated high-flow [place] during daytime and BiPAP at night.  Did well on budesonide, arformoterol, and revefenacin nebs while hospitalized.  Tube feeds for nutrition supplementation in setting of severe pulmonary cachexia  Home O2 ~4LPM  Marrianne Mood MD 02/07/2023, 12:59 PM

## 2023-02-08 DIAGNOSIS — J9621 Acute and chronic respiratory failure with hypoxia: Secondary | ICD-10-CM

## 2023-02-08 DIAGNOSIS — R6520 Severe sepsis without septic shock: Secondary | ICD-10-CM

## 2023-02-08 DIAGNOSIS — I5032 Chronic diastolic (congestive) heart failure: Secondary | ICD-10-CM

## 2023-02-08 DIAGNOSIS — M0510 Rheumatoid lung disease with rheumatoid arthritis of unspecified site: Secondary | ICD-10-CM

## 2023-02-08 LAB — C DIFFICILE QUICK SCREEN W PCR REFLEX
C Diff antigen: POSITIVE — AB
C Diff toxin: NEGATIVE

## 2023-02-08 LAB — CLOSTRIDIUM DIFFICILE BY PCR, REFLEXED: Toxigenic C. Difficile by PCR: POSITIVE — AB

## 2023-02-09 DIAGNOSIS — J9621 Acute and chronic respiratory failure with hypoxia: Secondary | ICD-10-CM

## 2023-02-09 DIAGNOSIS — I5032 Chronic diastolic (congestive) heart failure: Secondary | ICD-10-CM

## 2023-02-09 DIAGNOSIS — R252 Cramp and spasm: Secondary | ICD-10-CM

## 2023-02-09 DIAGNOSIS — M0510 Rheumatoid lung disease with rheumatoid arthritis of unspecified site: Secondary | ICD-10-CM

## 2023-02-09 DIAGNOSIS — R6520 Severe sepsis without septic shock: Secondary | ICD-10-CM

## 2023-02-09 LAB — BASIC METABOLIC PANEL WITH GFR
Anion gap: 7 (ref 5–15)
BUN: 18 mg/dL (ref 8–23)
CO2: 32 mmol/L (ref 22–32)
Calcium: 8.5 mg/dL — ABNORMAL LOW (ref 8.9–10.3)
Chloride: 94 mmol/L — ABNORMAL LOW (ref 98–111)
Creatinine, Ser: 0.77 mg/dL (ref 0.61–1.24)
GFR, Estimated: >60 mL/min (ref >60)
Glucose, Bld: 150 mg/dL — ABNORMAL HIGH (ref 70–99)
Potassium: 4.9 mmol/L (ref 3.5–5.1)
Sodium: 133 mmol/L — ABNORMAL LOW (ref 135–145)

## 2023-02-10 DIAGNOSIS — M0510 Rheumatoid lung disease with rheumatoid arthritis of unspecified site: Secondary | ICD-10-CM

## 2023-02-10 DIAGNOSIS — I5032 Chronic diastolic (congestive) heart failure: Secondary | ICD-10-CM

## 2023-02-10 DIAGNOSIS — R6520 Severe sepsis without septic shock: Secondary | ICD-10-CM

## 2023-02-10 DIAGNOSIS — J9621 Acute and chronic respiratory failure with hypoxia: Secondary | ICD-10-CM

## 2023-02-11 DIAGNOSIS — J9621 Acute and chronic respiratory failure with hypoxia: Secondary | ICD-10-CM

## 2023-02-11 DIAGNOSIS — R6520 Severe sepsis without septic shock: Secondary | ICD-10-CM

## 2023-02-11 DIAGNOSIS — M0510 Rheumatoid lung disease with rheumatoid arthritis of unspecified site: Secondary | ICD-10-CM

## 2023-02-11 DIAGNOSIS — I5032 Chronic diastolic (congestive) heart failure: Secondary | ICD-10-CM

## 2023-02-12 DIAGNOSIS — R6520 Severe sepsis without septic shock: Secondary | ICD-10-CM

## 2023-02-12 DIAGNOSIS — J9621 Acute and chronic respiratory failure with hypoxia: Secondary | ICD-10-CM

## 2023-02-12 DIAGNOSIS — I5032 Chronic diastolic (congestive) heart failure: Secondary | ICD-10-CM

## 2023-02-12 DIAGNOSIS — M0510 Rheumatoid lung disease with rheumatoid arthritis of unspecified site: Secondary | ICD-10-CM

## 2023-02-13 DIAGNOSIS — J9621 Acute and chronic respiratory failure with hypoxia: Secondary | ICD-10-CM

## 2023-02-13 DIAGNOSIS — I5032 Chronic diastolic (congestive) heart failure: Secondary | ICD-10-CM

## 2023-02-13 DIAGNOSIS — R6520 Severe sepsis without septic shock: Secondary | ICD-10-CM

## 2023-02-13 DIAGNOSIS — M0510 Rheumatoid lung disease with rheumatoid arthritis of unspecified site: Secondary | ICD-10-CM

## 2023-02-14 DIAGNOSIS — I5032 Chronic diastolic (congestive) heart failure: Secondary | ICD-10-CM

## 2023-02-14 DIAGNOSIS — M0510 Rheumatoid lung disease with rheumatoid arthritis of unspecified site: Secondary | ICD-10-CM

## 2023-02-14 DIAGNOSIS — R6520 Severe sepsis without septic shock: Secondary | ICD-10-CM

## 2023-02-14 DIAGNOSIS — J9621 Acute and chronic respiratory failure with hypoxia: Secondary | ICD-10-CM

## 2023-02-15 LAB — POTASSIUM: Potassium: 5.2 mmol/L — ABNORMAL HIGH (ref 3.5–5.1)

## 2023-02-18 DIAGNOSIS — R6520 Severe sepsis without septic shock: Secondary | ICD-10-CM

## 2023-02-18 DIAGNOSIS — I5032 Chronic diastolic (congestive) heart failure: Secondary | ICD-10-CM

## 2023-02-18 DIAGNOSIS — J9621 Acute and chronic respiratory failure with hypoxia: Secondary | ICD-10-CM

## 2023-02-18 DIAGNOSIS — M0510 Rheumatoid lung disease with rheumatoid arthritis of unspecified site: Secondary | ICD-10-CM

## 2023-02-19 LAB — BASIC METABOLIC PANEL
Anion gap: 11 (ref 5–15)
BUN: 27 mg/dL — ABNORMAL HIGH (ref 8–23)
CO2: 29 mmol/L (ref 22–32)
Calcium: 8.9 mg/dL (ref 8.9–10.3)
Chloride: 93 mmol/L — ABNORMAL LOW (ref 98–111)
Creatinine, Ser: 0.99 mg/dL (ref 0.61–1.24)
GFR, Estimated: >60 mL/min (ref >60)
Glucose, Bld: 101 mg/dL — ABNORMAL HIGH (ref 70–99)
Potassium: 4.9 mmol/L (ref 3.5–5.1)
Sodium: 133 mmol/L — ABNORMAL LOW (ref 135–145)

## 2023-02-21 DIAGNOSIS — J9621 Acute and chronic respiratory failure with hypoxia: Secondary | ICD-10-CM

## 2023-02-21 DIAGNOSIS — M0510 Rheumatoid lung disease with rheumatoid arthritis of unspecified site: Secondary | ICD-10-CM

## 2023-02-21 DIAGNOSIS — R6520 Severe sepsis without septic shock: Secondary | ICD-10-CM

## 2023-02-21 DIAGNOSIS — I5032 Chronic diastolic (congestive) heart failure: Secondary | ICD-10-CM

## 2023-02-21 LAB — BASIC METABOLIC PANEL
Anion gap: 9 (ref 5–15)
BUN: 24 mg/dL — ABNORMAL HIGH (ref 8–23)
CO2: 31 mmol/L (ref 22–32)
Calcium: 8.8 mg/dL — ABNORMAL LOW (ref 8.9–10.3)
Chloride: 92 mmol/L — ABNORMAL LOW (ref 98–111)
Creatinine, Ser: 1.05 mg/dL (ref 0.61–1.24)
GFR, Estimated: >60 mL/min (ref >60)
Glucose, Bld: 108 mg/dL — ABNORMAL HIGH (ref 70–99)
Potassium: 4.3 mmol/L (ref 3.5–5.1)
Sodium: 132 mmol/L — ABNORMAL LOW (ref 135–145)

## 2023-02-22 DIAGNOSIS — R6520 Severe sepsis without septic shock: Secondary | ICD-10-CM

## 2023-02-22 DIAGNOSIS — M0510 Rheumatoid lung disease with rheumatoid arthritis of unspecified site: Secondary | ICD-10-CM

## 2023-02-22 DIAGNOSIS — I5032 Chronic diastolic (congestive) heart failure: Secondary | ICD-10-CM

## 2023-02-22 DIAGNOSIS — J9621 Acute and chronic respiratory failure with hypoxia: Secondary | ICD-10-CM

## 2023-02-23 DIAGNOSIS — R6520 Severe sepsis without septic shock: Secondary | ICD-10-CM

## 2023-02-23 DIAGNOSIS — J9621 Acute and chronic respiratory failure with hypoxia: Secondary | ICD-10-CM

## 2023-02-23 DIAGNOSIS — M0510 Rheumatoid lung disease with rheumatoid arthritis of unspecified site: Secondary | ICD-10-CM

## 2023-02-23 DIAGNOSIS — I5032 Chronic diastolic (congestive) heart failure: Secondary | ICD-10-CM

## 2023-02-24 ENCOUNTER — Other Ambulatory Visit (HOSPITAL_COMMUNITY): Payer: Self-pay

## 2023-02-24 ENCOUNTER — Other Ambulatory Visit: Payer: Self-pay

## 2023-02-24 DIAGNOSIS — I5032 Chronic diastolic (congestive) heart failure: Secondary | ICD-10-CM

## 2023-02-24 DIAGNOSIS — R6520 Severe sepsis without septic shock: Secondary | ICD-10-CM

## 2023-02-24 DIAGNOSIS — M0510 Rheumatoid lung disease with rheumatoid arthritis of unspecified site: Secondary | ICD-10-CM

## 2023-02-24 DIAGNOSIS — J9621 Acute and chronic respiratory failure with hypoxia: Secondary | ICD-10-CM

## 2023-02-24 LAB — CBC
HCT: 55.4 % — ABNORMAL HIGH (ref 39.0–52.0)
Hemoglobin: 18.1 g/dL — ABNORMAL HIGH (ref 13.0–17.0)
MCH: 26.8 pg (ref 26.0–34.0)
MCHC: 32.7 g/dL (ref 30.0–36.0)
MCV: 82.1 fL (ref 80.0–100.0)
Platelets: 84 10*3/uL — ABNORMAL LOW (ref 150–400)
RBC: 6.75 MIL/uL — ABNORMAL HIGH (ref 4.22–5.81)
RDW: 17.6 % — ABNORMAL HIGH (ref 11.5–15.5)
WBC: 7.2 10*3/uL (ref 4.0–10.5)
nRBC: 0 % (ref 0.0–0.2)

## 2023-02-24 LAB — COMPREHENSIVE METABOLIC PANEL
ALT: 34 U/L (ref 0–44)
AST: 22 U/L (ref 15–41)
Albumin: 3.2 g/dL — ABNORMAL LOW (ref 3.5–5.0)
Alkaline Phosphatase: 42 U/L (ref 38–126)
Anion gap: 9 (ref 5–15)
BUN: 17 mg/dL (ref 8–23)
CO2: 28 mmol/L (ref 22–32)
Calcium: 8.5 mg/dL — ABNORMAL LOW (ref 8.9–10.3)
Chloride: 94 mmol/L — ABNORMAL LOW (ref 98–111)
Creatinine, Ser: 1.09 mg/dL (ref 0.61–1.24)
GFR, Estimated: >60 mL/min (ref >60)
Glucose, Bld: 94 mg/dL (ref 70–99)
Potassium: 4.9 mmol/L (ref 3.5–5.1)
Sodium: 131 mmol/L — ABNORMAL LOW (ref 135–145)
Total Bilirubin: 0.9 mg/dL (ref 0.3–1.2)
Total Protein: 5.6 g/dL — ABNORMAL LOW (ref 6.5–8.1)

## 2023-02-24 LAB — TROPONIN I (HIGH SENSITIVITY): Troponin I (High Sensitivity): 6 ng/L (ref <18)

## 2023-02-25 DIAGNOSIS — R2689 Other abnormalities of gait and mobility: Secondary | ICD-10-CM | POA: Diagnosis not present

## 2023-02-25 DIAGNOSIS — I700 Atherosclerosis of aorta: Secondary | ICD-10-CM | POA: Diagnosis not present

## 2023-02-25 DIAGNOSIS — Z736 Limitation of activities due to disability: Secondary | ICD-10-CM | POA: Diagnosis not present

## 2023-02-25 DIAGNOSIS — J1282 Pneumonia due to coronavirus disease 2019: Secondary | ICD-10-CM | POA: Diagnosis not present

## 2023-02-25 DIAGNOSIS — Z9981 Dependence on supplemental oxygen: Secondary | ICD-10-CM | POA: Diagnosis not present

## 2023-02-25 DIAGNOSIS — Z931 Gastrostomy status: Secondary | ICD-10-CM | POA: Diagnosis not present

## 2023-02-25 DIAGNOSIS — U099 Post covid-19 condition, unspecified: Secondary | ICD-10-CM | POA: Diagnosis not present

## 2023-02-25 DIAGNOSIS — J8410 Pulmonary fibrosis, unspecified: Secondary | ICD-10-CM | POA: Diagnosis not present

## 2023-02-25 DIAGNOSIS — J9621 Acute and chronic respiratory failure with hypoxia: Secondary | ICD-10-CM | POA: Diagnosis not present

## 2023-02-25 DIAGNOSIS — G7281 Critical illness myopathy: Secondary | ICD-10-CM | POA: Diagnosis not present

## 2023-02-25 DIAGNOSIS — M0510 Rheumatoid lung disease with rheumatoid arthritis of unspecified site: Secondary | ICD-10-CM

## 2023-02-25 DIAGNOSIS — Z85038 Personal history of other malignant neoplasm of large intestine: Secondary | ICD-10-CM | POA: Diagnosis not present

## 2023-02-25 DIAGNOSIS — I5032 Chronic diastolic (congestive) heart failure: Secondary | ICD-10-CM | POA: Diagnosis not present

## 2023-02-25 DIAGNOSIS — Z681 Body mass index (BMI) 19 or less, adult: Secondary | ICD-10-CM | POA: Diagnosis not present

## 2023-02-25 DIAGNOSIS — E43 Unspecified severe protein-calorie malnutrition: Secondary | ICD-10-CM | POA: Diagnosis not present

## 2023-02-25 DIAGNOSIS — G9332 Myalgic encephalomyelitis/chronic fatigue syndrome: Secondary | ICD-10-CM | POA: Diagnosis not present

## 2023-02-25 DIAGNOSIS — Z7901 Long term (current) use of anticoagulants: Secondary | ICD-10-CM | POA: Diagnosis not present

## 2023-02-25 DIAGNOSIS — R0682 Tachypnea, not elsewhere classified: Secondary | ICD-10-CM | POA: Diagnosis not present

## 2023-02-25 DIAGNOSIS — Z942 Lung transplant status: Secondary | ICD-10-CM | POA: Diagnosis not present

## 2023-02-25 DIAGNOSIS — D849 Immunodeficiency, unspecified: Secondary | ICD-10-CM | POA: Diagnosis not present

## 2023-02-25 DIAGNOSIS — M069 Rheumatoid arthritis, unspecified: Secondary | ICD-10-CM | POA: Diagnosis not present

## 2023-02-25 DIAGNOSIS — Z7952 Long term (current) use of systemic steroids: Secondary | ICD-10-CM | POA: Diagnosis not present

## 2023-02-25 DIAGNOSIS — R6520 Severe sepsis without septic shock: Secondary | ICD-10-CM

## 2023-02-25 DIAGNOSIS — D352 Benign neoplasm of pituitary gland: Secondary | ICD-10-CM | POA: Diagnosis not present

## 2023-02-25 DIAGNOSIS — J849 Interstitial pulmonary disease, unspecified: Secondary | ICD-10-CM | POA: Diagnosis not present

## 2023-02-25 DIAGNOSIS — R000 Tachycardia, unspecified: Secondary | ICD-10-CM | POA: Diagnosis not present

## 2023-02-25 DIAGNOSIS — K219 Gastro-esophageal reflux disease without esophagitis: Secondary | ICD-10-CM | POA: Diagnosis not present

## 2023-02-25 DIAGNOSIS — A0472 Enterocolitis due to Clostridium difficile, not specified as recurrent: Secondary | ICD-10-CM | POA: Diagnosis not present

## 2023-03-03 ENCOUNTER — Ambulatory Visit: Payer: Medicare Other | Admitting: Internal Medicine

## 2023-03-08 ENCOUNTER — Emergency Department (HOSPITAL_BASED_OUTPATIENT_CLINIC_OR_DEPARTMENT_OTHER): Payer: Medicare Other

## 2023-03-08 ENCOUNTER — Other Ambulatory Visit: Payer: Self-pay

## 2023-03-08 ENCOUNTER — Encounter (HOSPITAL_BASED_OUTPATIENT_CLINIC_OR_DEPARTMENT_OTHER): Payer: Self-pay

## 2023-03-08 ENCOUNTER — Observation Stay (HOSPITAL_BASED_OUTPATIENT_CLINIC_OR_DEPARTMENT_OTHER)
Admission: EM | Admit: 2023-03-08 | Discharge: 2023-03-10 | Disposition: A | Discharge status: Home / Self Care | Payer: Medicare Other | Attending: Internal Medicine | Admitting: Internal Medicine

## 2023-03-08 DIAGNOSIS — D352 Benign neoplasm of pituitary gland: Secondary | ICD-10-CM | POA: Diagnosis not present

## 2023-03-08 DIAGNOSIS — J984 Other disorders of lung: Secondary | ICD-10-CM | POA: Diagnosis not present

## 2023-03-08 DIAGNOSIS — J309 Allergic rhinitis, unspecified: Secondary | ICD-10-CM | POA: Diagnosis not present

## 2023-03-08 DIAGNOSIS — M359 Systemic involvement of connective tissue, unspecified: Secondary | ICD-10-CM | POA: Diagnosis not present

## 2023-03-08 DIAGNOSIS — J84112 Idiopathic pulmonary fibrosis: Secondary | ICD-10-CM | POA: Diagnosis not present

## 2023-03-08 DIAGNOSIS — Z9981 Dependence on supplemental oxygen: Secondary | ICD-10-CM

## 2023-03-08 DIAGNOSIS — M459 Ankylosing spondylitis of unspecified sites in spine: Secondary | ICD-10-CM | POA: Diagnosis not present

## 2023-03-08 DIAGNOSIS — J9601 Acute respiratory failure with hypoxia: Secondary | ICD-10-CM | POA: Diagnosis not present

## 2023-03-08 DIAGNOSIS — R0602 Shortness of breath: Secondary | ICD-10-CM | POA: Diagnosis not present

## 2023-03-08 DIAGNOSIS — E875 Hyperkalemia: Secondary | ICD-10-CM | POA: Diagnosis not present

## 2023-03-08 DIAGNOSIS — K219 Gastro-esophageal reflux disease without esophagitis: Secondary | ICD-10-CM | POA: Diagnosis not present

## 2023-03-08 DIAGNOSIS — J1282 Pneumonia due to coronavirus disease 2019: Secondary | ICD-10-CM | POA: Diagnosis not present

## 2023-03-08 DIAGNOSIS — R0902 Hypoxemia: Principal | ICD-10-CM

## 2023-03-08 DIAGNOSIS — J9611 Chronic respiratory failure with hypoxia: Secondary | ICD-10-CM | POA: Diagnosis present

## 2023-03-08 DIAGNOSIS — E785 Hyperlipidemia, unspecified: Secondary | ICD-10-CM | POA: Diagnosis not present

## 2023-03-08 DIAGNOSIS — U071 COVID-19: Secondary | ICD-10-CM | POA: Diagnosis not present

## 2023-03-08 DIAGNOSIS — Z7982 Long term (current) use of aspirin: Secondary | ICD-10-CM | POA: Diagnosis not present

## 2023-03-08 DIAGNOSIS — Z7952 Long term (current) use of systemic steroids: Secondary | ICD-10-CM | POA: Diagnosis not present

## 2023-03-08 DIAGNOSIS — A0472 Enterocolitis due to Clostridium difficile, not specified as recurrent: Secondary | ICD-10-CM | POA: Diagnosis not present

## 2023-03-08 DIAGNOSIS — G7281 Critical illness myopathy: Secondary | ICD-10-CM | POA: Diagnosis not present

## 2023-03-08 DIAGNOSIS — C189 Malignant neoplasm of colon, unspecified: Secondary | ICD-10-CM | POA: Diagnosis not present

## 2023-03-08 DIAGNOSIS — E43 Unspecified severe protein-calorie malnutrition: Secondary | ICD-10-CM | POA: Diagnosis not present

## 2023-03-08 DIAGNOSIS — I5032 Chronic diastolic (congestive) heart failure: Secondary | ICD-10-CM | POA: Diagnosis not present

## 2023-03-08 DIAGNOSIS — Z556 Problems related to health literacy: Secondary | ICD-10-CM | POA: Diagnosis not present

## 2023-03-08 DIAGNOSIS — G9332 Myalgic encephalomyelitis/chronic fatigue syndrome: Secondary | ICD-10-CM | POA: Diagnosis not present

## 2023-03-08 DIAGNOSIS — J849 Interstitial pulmonary disease, unspecified: Secondary | ICD-10-CM | POA: Diagnosis not present

## 2023-03-08 DIAGNOSIS — B370 Candidal stomatitis: Secondary | ICD-10-CM | POA: Diagnosis present

## 2023-03-08 DIAGNOSIS — Z792 Long term (current) use of antibiotics: Secondary | ICD-10-CM | POA: Diagnosis not present

## 2023-03-08 DIAGNOSIS — I110 Hypertensive heart disease with heart failure: Secondary | ICD-10-CM | POA: Diagnosis not present

## 2023-03-08 DIAGNOSIS — J9621 Acute and chronic respiratory failure with hypoxia: Secondary | ICD-10-CM | POA: Diagnosis not present

## 2023-03-08 DIAGNOSIS — M0510 Rheumatoid lung disease with rheumatoid arthritis of unspecified site: Secondary | ICD-10-CM | POA: Diagnosis present

## 2023-03-08 DIAGNOSIS — I700 Atherosclerosis of aorta: Secondary | ICD-10-CM | POA: Diagnosis not present

## 2023-03-08 DIAGNOSIS — I5040 Unspecified combined systolic (congestive) and diastolic (congestive) heart failure: Secondary | ICD-10-CM | POA: Diagnosis not present

## 2023-03-08 DIAGNOSIS — Z7951 Long term (current) use of inhaled steroids: Secondary | ICD-10-CM | POA: Diagnosis not present

## 2023-03-08 LAB — BASIC METABOLIC PANEL
Anion gap: 10 (ref 5–15)
BUN: 27 mg/dL — ABNORMAL HIGH (ref 8–23)
CO2: 30 mmol/L (ref 22–32)
Calcium: 8.8 mg/dL — ABNORMAL LOW (ref 8.9–10.3)
Chloride: 96 mmol/L — ABNORMAL LOW (ref 98–111)
Creatinine, Ser: 0.87 mg/dL (ref 0.61–1.24)
GFR, Estimated: >60 mL/min (ref >60)
Glucose, Bld: 133 mg/dL — ABNORMAL HIGH (ref 70–99)
Potassium: 5.7 mmol/L — ABNORMAL HIGH (ref 3.5–5.1)
Sodium: 136 mmol/L (ref 135–145)

## 2023-03-08 LAB — CBC WITH DIFFERENTIAL/PLATELET
Abs Immature Granulocytes: 1.42 10*3/uL — ABNORMAL HIGH (ref 0.00–0.07)
Basophils Absolute: 0 10*3/uL (ref 0.0–0.1)
Basophils Relative: 0 %
Eosinophils Absolute: 0 10*3/uL (ref 0.0–0.5)
Eosinophils Relative: 0 %
HCT: 56.6 % — ABNORMAL HIGH (ref 39.0–52.0)
Hemoglobin: 17.9 g/dL — ABNORMAL HIGH (ref 13.0–17.0)
Immature Granulocytes: 13 %
Lymphocytes Relative: 5 %
Lymphs Abs: 0.6 10*3/uL — ABNORMAL LOW (ref 0.7–4.0)
MCH: 27.2 pg (ref 26.0–34.0)
MCHC: 31.6 g/dL (ref 30.0–36.0)
MCV: 86.1 fL (ref 80.0–100.0)
Monocytes Absolute: 0.5 10*3/uL (ref 0.1–1.0)
Monocytes Relative: 5 %
Neutro Abs: 8.1 10*3/uL — ABNORMAL HIGH (ref 1.7–7.7)
Neutrophils Relative %: 77 %
Platelets: 218 10*3/uL (ref 150–400)
RBC: 6.57 MIL/uL — ABNORMAL HIGH (ref 4.22–5.81)
RDW: 19.2 % — ABNORMAL HIGH (ref 11.5–15.5)
WBC: 10.6 10*3/uL — ABNORMAL HIGH (ref 4.0–10.5)
nRBC: 0.8 % — ABNORMAL HIGH (ref 0.0–0.2)

## 2023-03-08 NOTE — ED Triage Notes (Signed)
Pt states that his O2 regulator has been out for 3 hours. 4L O2 at baseline.

## 2023-03-08 NOTE — ED Provider Notes (Signed)
[place] [facility] AT [facility] [place] Provider Note   CSN: [id number] Arrival date & time: 03/08/23  2128     History {Add pertinent medical, surgical, social history, OB history to HPI:1} Chief Complaint  Patient presents with   Shortness of Breath    Earl Thomas is a 72 y.o. male.  He has a history of rheumatoid lung disease interstitial lung disease.  He contracted COVID at the beginning of August and was in the ICU for a bit of time.  [facility] and was discharged yesterday.  He has been receiving oxygen by a company [employer].  Tonight around 6 PM the oxygen machine alarmed and the oxygen began coming out.  Patient's saturation dropped.  Patient's wife was able to reach the company but they could not send anybody out until Tuesday.  They are presenting here for further evaluation.  Patient denies any chest pain.  He feels the shortness of breath is at baseline.  He said he is always tachycardic but usually when he relaxes they will come down to around 110.  No fever.  The history is provided by the patient and the spouse.  Shortness of Breath Severity:  Moderate Onset quality:  Gradual Duration:  2 hours Timing:  Constant Progression:  Worsening Chronicity:  Recurrent Relieved by:  Nothing Worsened by:  Activity and coughing Ineffective treatments:  Rest Associated symptoms: cough   Associated symptoms: no abdominal pain, no chest pain, no fever, no hemoptysis and no sputum production        Home Medications Prior to Admission medications   Medication Sig Start Date End Date Taking? Authorizing Provider  albuterol (VENTOLIN HFA) 108 (90 Base) MCG/ACT inhaler Inhale 1-2 puffs into the lungs every 6 (six) hours as needed for wheezing or shortness of breath. 11/07/22   Charlott Holler, MD  arformoterol (BROVANA) 15 MCG/2ML NEBU Take 2 mLs (15 mcg total) by nebulization 2 (two) times daily.  02/07/23   Marrianne Mood, MD  ascorbic acid (VITAMIN C) 500 MG tablet Take 500 mg by mouth daily.    [provider]  aspirin EC 81 MG tablet Take 81 mg by mouth every morning.    [provider]  atorvastatin (LIPITOR) 10 MG tablet Take 1 tablet (10 mg total) by mouth daily. 04/30/22   Georgina Quint, MD  azelastine (ASTELIN) 0.1 % nasal spray Place 2 sprays into both nostrils 2 (two) times daily. Use in each nostril as directed Patient taking differently: Place 2 sprays into both nostrils 2 (two) times daily as needed for rhinitis or allergies (congestion). Use in each nostril as directed 12/25/21   Ferol Luz, MD  Brimonidine Tartrate (LUMIFY) 0.025 % SOLN Place 1 drop into both eyes every Sunday.    [provider]  budesonide (PULMICORT) 0.25 MG/2ML nebulizer solution Take 2 mLs (0.25 mg total) by nebulization 2 (two) times daily. 02/07/23   Marrianne Mood, MD  dextromethorphan (DELSYM) 30 MG/5ML liquid Take 5 mLs (30 mg total) by mouth at bedtime as needed for cough. 02/07/23   Marrianne Mood, MD  docusate sodium (COLACE) 100 MG capsule Take 1 capsule (100 mg total) by mouth 2 (two) times daily as needed for mild constipation. 02/07/23   Marrianne Mood, MD  famotidine (PEPCID) 20 MG tablet Take 1 tablet (20 mg total) by mouth 2 (two) times daily. 04/30/22   Georgina Quint, MD  guaiFENesin (ROBITUSSIN) 100 MG/5ML  liquid Take 5 mLs by mouth every 4 (four) hours as needed for cough or to loosen phlegm. 02/07/23   Marrianne Mood, MD  insulin aspart (NOVOLOG) 100 UNIT/ML injection Inject 0-9 Units into the skin every 4 (four) hours. 02/07/23   Marrianne Mood, MD  ipratropium-albuterol (DUONEB) 0.5-2.5 (3) MG/3ML SOLN Take 3 mLs by nebulization every 4 (four) hours as needed. 02/07/23   Marrianne Mood, MD  levocetirizine (XYZAL) 5 MG tablet TAKE 1 TABLET BY MOUTH ONCE DAILY IN THE EVENING 07/09/22   Ferol Luz, MD  Multiple Vitamin  (MULTIVITAMIN WITH MINERALS) TABS tablet Place 1 tablet into feeding tube daily. 02/08/23   Marrianne Mood, MD  nitroGLYCERIN (NITROSTAT) 0.4 MG SL tablet Place 0.4 mg under the tongue every 5 (five) minutes as needed for chest pain.    [provider]  Nutritional Supplements (FEEDING SUPPLEMENT, OSMOLITE 1.5 CAL,) LIQD Place 1,000 mLs into feeding tube continuous. 02/07/23   Marrianne Mood, MD  polyethylene glycol (MIRALAX / GLYCOLAX) 17 g packet Take 17 g by mouth daily as needed for moderate constipation. 02/07/23   Marrianne Mood, MD  Protein (FEEDING SUPPLEMENT, PROSOURCE TF20,) liquid Place 60 mLs into feeding tube daily. 02/08/23   Marrianne Mood, MD  revefenacin (YUPELRI) 175 MCG/3ML nebulizer solution Take 3 mLs (175 mcg total) by nebulization daily. 02/08/23   Marrianne Mood, MD  riTUXimab (RITUXAN IV) Inject into the vein.    [provider]  Testosterone Enanthate (XYOSTED) 75 MG/0.5ML SOAJ Inject 75 mg into the skin once a week. 10/21/22   Georgina Quint, MD  thiamine (VITAMIN B-1) 100 MG tablet Place 1 tablet (100 mg total) into feeding tube daily. 02/08/23   Marrianne Mood, MD  Vibegron (GEMTESA) 75 MG TABS Take 1 tablet (75 mg total) by mouth daily at 6 (six) AM. 01/23/23   Sagardia, Eilleen Kempf, MD      Allergies    Patient has no known allergies.    Review of Systems   Review of Systems  Constitutional:  Negative for fever.  Respiratory:  Positive for cough and shortness of breath. Negative for hemoptysis and sputum production.   Cardiovascular:  Negative for chest pain.  Gastrointestinal:  Negative for abdominal pain.    Physical Exam Updated Vital Signs BP 125/71 (BP Location: Right Arm)   Pulse (!) 115   Temp 98.3 F (36.8 C) (Axillary)   Resp (!) 35   Ht 5\' 5"  (1.651 m)   Wt 45.9 kg   SpO2 100%   BMI 16.84 kg/m  Physical Exam Vitals and nursing note reviewed.  Constitutional:      General: He is not in acute distress.     Appearance: He is well-developed. He is cachectic.  HENT:     Head: Normocephalic and atraumatic.  Eyes:     Conjunctiva/sclera: Conjunctivae normal.  Cardiovascular:     Rate and Rhythm: Regular rhythm. Tachycardia present.     Heart sounds: No murmur heard. Pulmonary:     Effort: Tachypnea and accessory muscle usage present. No respiratory distress.     Breath sounds: Normal breath sounds.  Abdominal:     Palpations: Abdomen is soft.     Tenderness: There is no abdominal tenderness.  Musculoskeletal:        General: No deformity.     Cervical back: Neck supple.  Skin:    General: Skin is warm and dry.     Capillary Refill: Capillary refill takes less than 2 seconds.  Neurological:  General: No focal deficit present.     Mental Status: He is alert.     ED Results / Procedures / Treatments   Labs (all labs ordered are listed, but only abnormal results are displayed) Labs Reviewed  BASIC METABOLIC PANEL  CBC WITH DIFFERENTIAL/PLATELET    EKG None  Radiology No results found.  Procedures Procedures  {Document cardiac monitor, telemetry assessment procedure when appropriate:1}  Medications Ordered in ED Medications - No data to display  ED Course/ Medical Decision Making/ A&P   {   Click here for ABCD2, HEART and other calculatorsREFRESH Note before signing :1}                              Medical Decision Making Amount and/or Complexity of Data Reviewed Labs: ordered.   This patient complains of ***; this involves an extensive number of treatment Options and is a complaint that carries with it a high risk of complications and morbidity. The differential includes ***  I ordered, reviewed and interpreted labs, which included *** I ordered medication *** and reviewed PMP when indicated. I ordered imaging studies which included *** and I independently    visualized and interpreted imaging which showed *** Additional history obtained from *** Previous  records obtained and reviewed *** I consulted *** and discussed lab and imaging findings and discussed disposition.  Cardiac monitoring reviewed, *** Social determinants considered, *** Critical Interventions: ***  After the interventions stated above, I reevaluated the patient and found *** Admission and further testing considered, ***   {Document critical care time when appropriate:1} {Document review of labs and clinical decision tools ie heart score, Chads2Vasc2 etc:1}  {Document your independent review of radiology images, and any outside records:1} {Document your discussion with family members, caretakers, and with consultants:1} {Document social determinants of health affecting pt's care:1} {Document your decision making why or why not admission, treatments were needed:1} Final Clinical Impression(s) / ED Diagnoses Final diagnoses:  None    Rx / DC Orders ED Discharge Orders     None

## 2023-03-08 NOTE — Progress Notes (Signed)
Plan of Care Note   Patient: Earl Thomas MRN: [id number]   DOA: 03/08/2023  Facility requesting transfer: [facility] Requesting Provider: Dr. Charm Barges Reason for transfer: Home O2 machine not working Facility course: 72 yo M with rheumatoid lung disease, had COVID-19 admit earlier this month.  Survived ICU, was able to wean off of vent, got out of rehab yesterday and was set up for home O2.  Only issue thats bringing him in to ED today is reportedly that his home O2 machine he got sent home with yesterday is not working.  Nothing acute going on medically otherwise according to EDP report to me, and just needs home oxygen.  SW at med center wasn't able to get home O2 set up today / tonight.   Given limited IP beds: rather than direct admission, I recd considering ED to ED transfer, for possible medicine consult if needed, I would of course defer to consulting doc final decision on admission; however, from what EDP is telling me it doesn't sound like he needs hospital admission at this time rather it sounds like he just needs SW to set up DME (home O2).  EDP agreed and will call [facility] about possible ED to ED transfer for SW.  Author: Hillary Bow., DO 03/08/2023  Check [email or web address] for on-call coverage.

## 2023-03-09 ENCOUNTER — Observation Stay (HOSPITAL_COMMUNITY): Payer: Medicare Other

## 2023-03-09 DIAGNOSIS — R0902 Hypoxemia: Secondary | ICD-10-CM | POA: Diagnosis not present

## 2023-03-09 DIAGNOSIS — E875 Hyperkalemia: Secondary | ICD-10-CM | POA: Diagnosis not present

## 2023-03-09 DIAGNOSIS — B370 Candidal stomatitis: Secondary | ICD-10-CM | POA: Diagnosis present

## 2023-03-09 DIAGNOSIS — J9601 Acute respiratory failure with hypoxia: Principal | ICD-10-CM

## 2023-03-09 DIAGNOSIS — Z931 Gastrostomy status: Secondary | ICD-10-CM | POA: Diagnosis not present

## 2023-03-09 DIAGNOSIS — J849 Interstitial pulmonary disease, unspecified: Secondary | ICD-10-CM

## 2023-03-09 DIAGNOSIS — R0602 Shortness of breath: Secondary | ICD-10-CM | POA: Diagnosis present

## 2023-03-09 DIAGNOSIS — Z7982 Long term (current) use of aspirin: Secondary | ICD-10-CM | POA: Diagnosis not present

## 2023-03-09 DIAGNOSIS — I5032 Chronic diastolic (congestive) heart failure: Secondary | ICD-10-CM | POA: Diagnosis not present

## 2023-03-09 DIAGNOSIS — I110 Hypertensive heart disease with heart failure: Secondary | ICD-10-CM | POA: Diagnosis not present

## 2023-03-09 LAB — CBC
HCT: 51.8 % (ref 39.0–52.0)
Hemoglobin: 16.5 g/dL (ref 13.0–17.0)
MCH: 26.9 pg (ref 26.0–34.0)
MCHC: 31.9 g/dL (ref 30.0–36.0)
MCV: 84.5 fL (ref 80.0–100.0)
Platelets: 233 10*3/uL (ref 150–400)
RBC: 6.13 MIL/uL — ABNORMAL HIGH (ref 4.22–5.81)
RDW: 18.6 % — ABNORMAL HIGH (ref 11.5–15.5)
WBC: 9.9 10*3/uL (ref 4.0–10.5)
nRBC: 0.4 % — ABNORMAL HIGH (ref 0.0–0.2)

## 2023-03-09 LAB — BASIC METABOLIC PANEL
Anion gap: 10 (ref 5–15)
BUN: 21 mg/dL (ref 8–23)
CO2: 31 mmol/L (ref 22–32)
Calcium: 8.4 mg/dL — ABNORMAL LOW (ref 8.9–10.3)
Chloride: 93 mmol/L — ABNORMAL LOW (ref 98–111)
Creatinine, Ser: 1.07 mg/dL (ref 0.61–1.24)
GFR, Estimated: >60 mL/min (ref >60)
Glucose, Bld: 121 mg/dL — ABNORMAL HIGH (ref 70–99)
Potassium: 4.5 mmol/L (ref 3.5–5.1)
Sodium: 134 mmol/L — ABNORMAL LOW (ref 135–145)

## 2023-03-09 MED ORDER — ACETAMINOPHEN 650 MG RE SUPP: 650.0000 mg | Freq: Four times a day (QID) | RECTAL | Status: DC | PRN

## 2023-03-09 MED ORDER — ONDANSETRON HCL 4 MG/2ML IJ SOLN: 4.0000 mg | Freq: Four times a day (QID) | INTRAMUSCULAR | Status: DC | PRN

## 2023-03-09 MED ORDER — ONDANSETRON HCL 4 MG PO TABS: 4.0000 mg | ORAL_TABLET | Freq: Four times a day (QID) | ORAL | Status: DC | PRN

## 2023-03-09 MED ORDER — OSMOLITE 1.2 CAL PO LIQD
237.0000 mL | Freq: Three times a day (TID) | ORAL | Status: DC
  Administered 2023-03-09 – 2023-03-10 (×4): 237 mL
  Filled 2023-03-09 (×5): qty 237

## 2023-03-09 MED ORDER — CARVEDILOL 3.125 MG PO TABS
3.1250 mg | ORAL_TABLET | Freq: Two times a day (BID) | ORAL | Status: DC
  Administered 2023-03-09 – 2023-03-10 (×3): 3.125 mg via ORAL
  Filled 2023-03-09 (×3): qty 1

## 2023-03-09 MED ORDER — METHYLPREDNISOLONE SODIUM SUCC 40 MG IJ SOLR
40.0000 mg | Freq: Two times a day (BID) | INTRAMUSCULAR | Status: DC
  Administered 2023-03-09 – 2023-03-10 (×2): 40 mg via INTRAVENOUS
  Filled 2023-03-09 (×2): qty 1

## 2023-03-09 MED ORDER — MIRABEGRON ER 25 MG PO TB24
25.0000 mg | ORAL_TABLET | Freq: Every day | ORAL | Status: DC
  Administered 2023-03-09 – 2023-03-10 (×2): 25 mg via ORAL
  Filled 2023-03-09 (×2): qty 1

## 2023-03-09 MED ORDER — AMOXICILLIN-POT CLAVULANATE 875-125 MG PO TABS
1.0000 | ORAL_TABLET | Freq: Two times a day (BID) | ORAL | Status: DC
  Administered 2023-03-09 – 2023-03-10 (×2): 1 via ORAL
  Filled 2023-03-09 (×3): qty 1

## 2023-03-09 MED ORDER — CALCIUM GLUCONATE-NACL 1-0.675 GM/50ML-% IV SOLN
1.0000 g | Freq: Once | INTRAVENOUS | Status: AC
  Administered 2023-03-09: 1000 mg via INTRAVENOUS
  Filled 2023-03-09: qty 50

## 2023-03-09 MED ORDER — BUDESONIDE 0.25 MG/2ML IN SUSP
0.2500 mg | Freq: Two times a day (BID) | RESPIRATORY_TRACT | Status: DC
  Administered 2023-03-10: 0.25 mg via RESPIRATORY_TRACT
  Filled 2023-03-09 (×2): qty 2

## 2023-03-09 MED ORDER — ACETAMINOPHEN 325 MG PO TABS: 650.0000 mg | ORAL_TABLET | Freq: Four times a day (QID) | ORAL | Status: DC | PRN

## 2023-03-09 MED ORDER — NYSTATIN 100000 UNIT/ML MT SUSP
5.0000 mL | Freq: Four times a day (QID) | OROMUCOSAL | Status: DC
  Administered 2023-03-09 – 2023-03-10 (×5): 500000 [IU] via ORAL
  Filled 2023-03-09 (×5): qty 5

## 2023-03-09 MED ORDER — OXYCODONE HCL 5 MG PO TABS: 5.0000 mg | ORAL_TABLET | ORAL | Status: DC | PRN

## 2023-03-09 MED ORDER — MEGESTROL ACETATE 400 MG/10ML PO SUSP
400.0000 mg | Freq: Every day | ORAL | Status: DC
  Administered 2023-03-09 – 2023-03-10 (×2): 400 mg via ORAL
  Filled 2023-03-09 (×2): qty 10

## 2023-03-09 MED ORDER — ENOXAPARIN SODIUM 40 MG/0.4ML IJ SOSY
40.0000 mg | PREFILLED_SYRINGE | INTRAMUSCULAR | Status: DC
  Administered 2023-03-09 – 2023-03-10 (×2): 40 mg via SUBCUTANEOUS
  Filled 2023-03-09 (×2): qty 0.4

## 2023-03-09 MED ORDER — SODIUM ZIRCONIUM CYCLOSILICATE 10 G PO PACK
10.0000 g | PACK | Freq: Once | ORAL | Status: AC
  Administered 2023-03-09: 10 g via ORAL
  Filled 2023-03-09: qty 1

## 2023-03-09 MED ORDER — ARFORMOTEROL TARTRATE 15 MCG/2ML IN NEBU: 15.0000 ug | INHALATION_SOLUTION | Freq: Two times a day (BID) | RESPIRATORY_TRACT | Status: DC

## 2023-03-09 MED ORDER — OSMOLITE 1.2 CAL PO LIQD: 237.0000 mL | Freq: Three times a day (TID) | ORAL | Status: DC

## 2023-03-09 MED ORDER — IPRATROPIUM-ALBUTEROL 0.5-2.5 (3) MG/3ML IN SOLN: 3.0000 mL | RESPIRATORY_TRACT | Status: DC | PRN

## 2023-03-09 MED ORDER — ATORVASTATIN CALCIUM 10 MG PO TABS
10.0000 mg | ORAL_TABLET | Freq: Every day | ORAL | Status: DC
  Administered 2023-03-09 – 2023-03-10 (×2): 10 mg via ORAL
  Filled 2023-03-09 (×2): qty 1

## 2023-03-09 MED ORDER — LACTATED RINGERS IV BOLUS
500.0000 mL | Freq: Once | INTRAVENOUS | Status: AC
  Administered 2023-03-09: 500 mL via INTRAVENOUS

## 2023-03-09 MED ORDER — ASPIRIN 81 MG PO TBEC
81.0000 mg | DELAYED_RELEASE_TABLET | Freq: Every morning | ORAL | Status: DC
  Administered 2023-03-09 – 2023-03-10 (×2): 81 mg via ORAL
  Filled 2023-03-09 (×2): qty 1

## 2023-03-09 NOTE — ED Notes (Signed)
Ambulated pt with 4L [place]. Pt's O2 sats dropped to 76%. Had to put the pt on 10L non-rebreather in order to get his O2 sats up. Pt is now back to 4L [place]. During episode, pt was tachypneic

## 2023-03-09 NOTE — ED Notes (Signed)
[facility] called for transport. 

## 2023-03-09 NOTE — ED Provider Notes (Signed)
Patient transferred from [facility] for transition of care consult.  He is awake, alert.  No acute distress.  Needs oxygen concentrator.  Diet ordered.   Shon Baton, MD 03/09/23 [id number]

## 2023-03-09 NOTE — ED Notes (Signed)
Report given to [facility] RN Zack, transport ETA approx 5 mins

## 2023-03-09 NOTE — H&P (Addendum)
[facility] History and Physical  Earl Thomas XBM:[id number] DOB: 09/25/1950 DOA: 03/08/2023   PCP: Shayne Alken, MD  Specialists: Followed by pulmonology, Dr. Celine Mans  Chief Complaint: Shortness of breath  HPI: Earl Thomas is a 72 y.o. male with a past medical history of rheumatoid arthritis associated lung disease, interstitial lung disease, chronic respiratory failure with hypoxia, on 4 L of oxygen by nasal cannula at home, who presented due to shortness of breath.  Patient was hospitalized with COVID about a month ago.  He spent a few days in the ICU and then was transferred to [facility] and then followed by rehab.  He was released from rehab on August 30.  He was set up with a oxygen concentrator.  While trying to use the concentrator at home, patient and his wife realized that the concentrator was not working well.  He was not getting any oxygen.  He had a portable tank which he started using but it was not adequate.  He started feeling more short of breath.  Decided to come into the emergency department.  When he was ambulated today he felt more short of breath than usual.  Required 10 L of oxygen which is normally not the case.  Denies any fever or chills.  No pedal edema.  No nausea or vomiting.  In the emergency department he underwent chest x-ray yesterday which did not show any acute findings, did show his chronic lung changes.  Blood work showed hyperkalemia.  No blood work has been done today and has been ordered.  Chest x-ray will be repeated today.  Required 10 L of oxygen after ambulation but now back down to 5 L.  Home Medications: This list is not reconciled yet Prior to Admission medications   Medication Sig Start Date End Date Taking? Authorizing Provider  albuterol (VENTOLIN HFA) 108 (90 Base) MCG/ACT inhaler Inhale 1-2 puffs into the lungs every 6 (six) hours as needed for wheezing or shortness of breath. 11/07/22   Charlott Holler, MD   amoxicillin-clavulanate (AUGMENTIN) 400-57 MG/5ML suspension Place 400 mg into feeding tube every 12 (twelve) hours. For 3 doses. 03/07/23 03/09/23  [provider]  arformoterol (BROVANA) 15 MCG/2ML NEBU Take 2 mLs (15 mcg total) by nebulization 2 (two) times daily. 02/07/23   Marrianne Mood, MD  ascorbic acid (VITAMIN C) 500 MG tablet Take 500 mg by mouth daily.    [provider]  aspirin EC 81 MG tablet Take 81 mg by mouth every morning.    [provider]  atorvastatin (LIPITOR) 10 MG tablet Take 1 tablet (10 mg total) by mouth daily. 04/30/22   Georgina Quint, MD  azelastine (ASTELIN) 0.1 % nasal spray Place 2 sprays into both nostrils 2 (two) times daily. Use in each nostril as directed Patient taking differently: Place 2 sprays into both nostrils 2 (two) times daily as needed for rhinitis or allergies (congestion). Use in each nostril as directed 12/25/21   Ferol Luz, MD  Brimonidine Tartrate (LUMIFY) 0.025 % SOLN Place 1 drop into both eyes every Sunday.    [provider]  budesonide (PULMICORT) 0.25 MG/2ML nebulizer solution Take 2 mLs (0.25 mg total) by nebulization 2 (two) times daily. 02/07/23   Marrianne Mood, MD  carvedilol (COREG) 3.125 MG tablet Take 3.125 mg by mouth 2 (two) times daily with a meal. 03/06/23   [provider]  cholestyramine (QUESTRAN) 4 g packet Take 4 g by mouth 2 (two) times daily. 03/06/23  [provider]  dextromethorphan (DELSYM) 30 MG/5ML liquid Take 5 mLs (30 mg total) by mouth at bedtime as needed for cough. 02/07/23   Marrianne Mood, MD  docusate sodium (COLACE) 100 MG capsule Take 1 capsule (100 mg total) by mouth 2 (two) times daily as needed for mild constipation. 02/07/23   Marrianne Mood, MD  famotidine (PEPCID) 20 MG tablet Take 1 tablet (20 mg total) by mouth 2 (two) times daily. 04/30/22   Georgina Quint, MD  fluticasone [facility]) 50 MCG/ACT nasal spray Place 1 spray into  both nostrils in the morning and at bedtime. 02/25/23   [provider]  guaiFENesin (ROBITUSSIN) 100 MG/5ML liquid Take 5 mLs by mouth every 4 (four) hours as needed for cough or to loosen phlegm. 02/07/23   Marrianne Mood, MD  insulin aspart (NOVOLOG) 100 UNIT/ML injection Inject 0-9 Units into the skin every 4 (four) hours. 02/07/23   Marrianne Mood, MD  ipratropium-albuterol (DUONEB) 0.5-2.5 (3) MG/3ML SOLN Take 3 mLs by nebulization every 4 (four) hours as needed. 02/07/23   Marrianne Mood, MD  levocetirizine (XYZAL) 5 MG tablet TAKE 1 TABLET BY MOUTH ONCE DAILY IN THE EVENING 07/09/22   Ferol Luz, MD  megestrol (MEGACE) 40 MG/ML suspension Take 400 mg by mouth daily. 03/07/23   [provider]  Multiple Vitamin (MULTIVITAMIN WITH MINERALS) TABS tablet Place 1 tablet into feeding tube daily. 02/08/23   Marrianne Mood, MD  nitroGLYCERIN (NITROSTAT) 0.4 MG SL tablet Place 0.4 mg under the tongue every 5 (five) minutes as needed for chest pain.    [provider]  Nutritional Supplements (FEEDING SUPPLEMENT, OSMOLITE 1.5 CAL,) LIQD Place 1,000 mLs into feeding tube continuous. 02/07/23   Marrianne Mood, MD  ondansetron (ZOFRAN-ODT) 4 MG disintegrating tablet Take 4 mg by mouth every 6 (six) hours as needed for nausea or vomiting. 03/06/23 03/13/23  [provider]  polyethylene glycol (MIRALAX / GLYCOLAX) 17 g packet Take 17 g by mouth daily as needed for moderate constipation. 02/07/23   Marrianne Mood, MD  predniSONE (DELTASONE) 10 MG tablet Take 10-40 mg by mouth See admin instructions. Take 4 tablets (40 mg total) by mouth daily for 4 days, THEN 3 tablets (30 mg total) daily for 5 days, THEN 2 tablets (20 mg total) daily for 5 days, THEN 1 tablet (10 mg total) daily for 5 days. 03/06/23 03/24/23  [provider]  Protein (FEEDING SUPPLEMENT, PROSOURCE TF20,) liquid Place 60 mLs into feeding tube daily. 02/08/23   Marrianne Mood, MD  revefenacin  (YUPELRI) 175 MCG/3ML nebulizer solution Take 3 mLs (175 mcg total) by nebulization daily. 02/08/23   Marrianne Mood, MD  riTUXimab (RITUXAN IV) Inject into the vein.    [provider]  sulfamethoxazole-trimethoprim (BACTRIM DS) 800-160 MG tablet Take 1 tablet by mouth 3 (three) times a week. For 21 days. Prevent pneumonia or any type of infection. 03/07/23 03/28/23  [provider]  Testosterone Enanthate (XYOSTED) 75 MG/0.5ML SOAJ Inject 75 mg into the skin once a week. 10/21/22   Georgina Quint, MD  thiamine (VITAMIN B-1) 100 MG tablet Place 1 tablet (100 mg total) into feeding tube daily. 02/08/23   Marrianne Mood, MD  umeclidinium-vilanterol (ANORO ELLIPTA) 62.5-25 MCG/ACT AEPB Inhale 1 puff into the lungs daily. 03/06/23   [provider]  Vibegron (GEMTESA) 75 MG TABS Take 1 tablet (75 mg total) by mouth daily at 6 (six) AM. 01/23/23   Georgina Quint, MD    Allergies: No  Known Allergies  Past Medical History: Past Medical History:  Diagnosis Date   Abnormal weight loss 12/04/2021   Allergic rhinitis 10/11/2022   Aortic atherosclerosis (HCC) 12/04/2021   Arthralgia of temporomandibular joint 12/04/2021   Arthritis    BMI 26.0-26.9,adult 10/22/2016   Chronic bronchitis (HCC) 06/17/2022   Chronic diastolic CHF (congestive heart failure) (HCC) 01/19/2022   Chronic fatigue syndrome 12/04/2021   Chronic hypoxic respiratory failure (HCC) 08/30/2022   Dyslipidemia 03/10/2012   Family history of prostate cancer 12/04/2021   GERD (gastroesophageal reflux disease) 01/19/2022   Glucose intolerance (impaired glucose tolerance)    Hemorrhage of rectum and anus 10/17/2022   Hyperlipidemia    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   Interstitial lung disease (HCC) 07/16/2019   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 (HCC) 10/17/2022   Long-term current use of high risk medication other than anticoagulant  07/16/2019   Other long term (current) drug therapy 12/04/2021   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  No history of smoking alcohol use or illicit drug use.    Family History:  Family History  Problem Relation Age of Onset   Cancer Father 25       prostate cancer   Diabetes Father    Heart disease Father 28       AMI late 34s   Cancer Mother 65       Breast cancer   Heart disease Mother        CABG at age 57   Diabetes Brother    Heart disease Brother        AMI x 2; CABG   Hyperlipidemia Brother      Review of Systems - History obtained from the patient General ROS: positive for  - fatigue Psychological ROS: negative Ophthalmic ROS: negative ENT ROS: negative Allergy and Immunology ROS: negative Hematological and Lymphatic ROS: negative Endocrine ROS: negative Respiratory ROS: As in HPI Cardiovascular ROS: As in HPI.  Denies chest pain Gastrointestinal ROS: no abdominal pain, change in bowel habits, or black or bloody stools Genito-Urinary ROS: no dysuria, trouble voiding, or hematuria Musculoskeletal ROS: negative Neurological ROS: no TIA or stroke symptoms Dermatological ROS: negative  Physical Examination  Vitals:   03/09/23 0800 03/09/23 0929 03/09/23 1115 03/09/23 1249  BP: (!) 147/135 114/84  111/77  Pulse: 85 (!) 101  (!) 103  Resp:  16  18  Temp:  97.8 F (36.6 C)   98.4 F (36.9 C)  TempSrc:  Axillary  Oral  SpO2: 100% 96% 95% 98%  Weight:      Height:        BP 111/77 (BP Location: Right Arm)   Pulse (!) 103   Temp 98.4 F (36.9 C) (Oral)   Resp 18   Ht 5\' 5"  (1.651 m)   Wt 45.9 kg   SpO2 98%   BMI 16.84 kg/m   General appearance: alert, cooperative,  appears stated age, and no distress Head: Normocephalic, without obvious abnormality, atraumatic Eyes: conjunctivae/corneas clear. PERRL, EOM's intact.  Throat: Whitish plaques noted in the back of the mouth Neck: no adenopathy, no carotid bruit, no JVD, supple, symmetrical, trachea midline, and thyroid not enlarged, symmetric, no tenderness/mass/nodules Back: symmetric, no curvature. ROM normal. No CVA tenderness. Resp: Scattered wheezing heard in the left lower lung.  Coarse crackles appreciated bilaterally.  Patient noted to be tachypneic.  No use of accessory muscles. Cardio: regular rate and rhythm, S1, S2 normal, no murmur, click, rub or gallop GI: soft, non-tender; bowel sounds normal; no masses,  no organomegaly. PEG noted Extremities: extremities normal, atraumatic, no cyanosis or edema Pulses: 2+ and symmetric Skin: Skin color, texture, turgor normal. No rashes or lesions Lymph nodes: Cervical, supraclavicular, and axillary nodes normal. Neurologic: Alert and oriented x 3.  Cranial nerves II to XII intact.  Motor strength equal bilateral upper and lower extremities.    Labs on Admission: I have personally reviewed following labs and imaging studies  CBC: Recent Labs  Lab 03/08/23 2140  WBC 10.6*  NEUTROABS 8.1*  HGB 17.9*  HCT 56.6*  MCV 86.1  PLT 218   Basic Metabolic Panel: Recent Labs  Lab 03/08/23 2140  NA 136  K 5.7*  CL 96*  CO2 30  GLUCOSE 133*  BUN 27*  CREATININE 0.87  CALCIUM 8.8*   GFR: Estimated Creatinine Clearance: 52 mL/min (by C-G formula based on SCr of 0.87 mg/dL).   Radiological Exams on Admission: DG Chest Port 1 View  Result Date:  03/08/2023 CLINICAL DATA:  Shortness of breath EXAM: PORTABLE CHEST 1 VIEW COMPARISON:  03/03/2023 FINDINGS: Severe chronic lung disease, stable. No definite acute process. No effusions. Cardiomediastinal contours within normal limits. IMPRESSION: Severe chronic lung disease.  No definite acute process. Electronically Signed   By: Charlett Nose M.D.   On: 03/08/2023 23:28    My interpretation of Electrocardiogram: Sinus tachycardia at 112 bpm.  Normal axis.  Intervals appear to be normal.  No concerning ST segment changes.  Prominent T waves noted.   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  Noted to have worsening oxygen requirements with ambulation which is not usual.  He was recently released from rehab.  Could be experiencing ILD exacerbation.  Plan:  #1. Acute on chronic respiratory failure with hypoxia: Uses 4 L of oxygen by nasal cannula at baseline.  Worsening oxygenation noted with ambulation today.  Chest x-ray from last night did not show any acute findings but did show chronic lung disease as known previously.  Will repeat chest x-ray today.  Due to concern for ILD exacerbation we will give him systemic steroids and nebulizer treatments.  Patient's wife mentioned that he was also supposed to take antibiotics for a few more days when he was released from the rehab.  Will prescribe Augmentin for now.  He was also placed on prednisone taper when he was released.  Observe in the hospital overnight.  TOC has been consulted to arrange new concentrator at home.  #2.  Interstitial lung  disease in the setting of rheumatoid lung  disease with rheumatoid arthritis: See above.  Patient follows with pulmonology on a regular basis.  #3.  Hyperkalemia: Noted on blood work from yesterday evening.  Will be repeated today.  Looks like he was given Lokelma in the ED.  #4.  Oral thrush: Nystatin  DVT Prophylaxis: Lovenox Code Status: CODE STATUS discussed with patient and his wife.  He wishes to be full code for now Family Communication: Discussed with patient's wife Disposition: Hopefully return home when improved Consults called: None yet Admission Status: Status is: Observation The patient remains OBS appropriate and will d/c before 2 midnights.    Severity of Illness: The appropriate patient status for this patient is OBSERVATION. Observation status is judged to be reasonable and necessary in order to provide the required intensity of service to ensure the patient's safety. The patient's presenting symptoms, physical exam findings, and initial radiographic and laboratory data in the context of their medical condition is felt to place them at decreased risk for further clinical deterioration. Furthermore, it is anticipated that the patient will be medically stable for discharge from the hospital within 2 midnights of admission.    Further management decisions will depend on results of further testing and patient's response to treatment.   Destinie Thornsberry [employer]  [facility] [employer] on [email or web address]  03/09/2023, 2:19 PM

## 2023-03-09 NOTE — Care Management (Addendum)
Transition of Care [facility]) - Emergency Department Mini Assessment   Patient Details  Name: Earl Thomas MRN: [id number] Date of Birth: 11-Aug-1950  [facility] of [facility]) CM/SW Contact:    Lockie Pares, RN Phone Number: 03/09/2023, 9:25 AM   Clinical Narrative: Recently discharged from [facility] and was set up with DME with [employer]  and home health. Messaged Jermaine from [employer] about oxygen Spoke with wife, He has [employer] for oxygen,  [employer] provided DME.  He normally wears 3-4 LPM when resting but needs sometimes as high as 7 when ambulating. Mrs Boatner states that the concentrator is malfunctioning and [employer] said they could not get to it until Tuesday. Mrs Fragoso called several oxygen providers to see about switching. She would like  [facility] or [employer]. Called [employer], they have to have proof the other oxygen was picked up. This can cannot happen until Tuesday according to his wife. Called [employer] He will just need repeat qualifications and they will bring a tank to him and when he gets home set him up for service. He spoke with Mrs Lovins last night. Messaged team for oxygen qualifications. 1100 Oxygen qualifications attempted patient had to increase to 10L while ambulating short distance.  ED Mini Assessment: What brought you to the Emergency Department? : Something wrong with oxygen- called [facility]  Barriers to Discharge: ED DME delivery        Interventions which prevented an admission or readmission: Other (must enter comment)    Patient Contact and Communications        ,                 Admission diagnosis:  SOB Patient Active Problem List   Diagnosis Date Noted   Lower urinary tract symptoms 01/20/2023   Hemorrhage of rectum and anus 10/17/2022   Personal history of colonic polyps 10/17/2022   Screening for malignant neoplasm of colon 10/17/2022   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 (HCC)  10/17/2022   Rheumatoid arthritis (HCC) 10/17/2022   Allergic rhinitis 10/11/2022   Chronic hypoxic respiratory failure (HCC) 08/30/2022   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   Chronic bronchitis (HCC) 06/17/2022   Protein-calorie malnutrition, severe (HCC) 06/17/2022   COVID-19 01/19/2022   GERD (gastroesophageal reflux disease) 01/19/2022   Chronic diastolic CHF (congestive heart failure) (HCC) 01/19/2022   Arthritis 12/25/2021   Hyperlipidemia 12/25/2021   Chronic fatigue syndrome 12/04/2021   Other long term (current) drug therapy 12/04/2021   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   Arthralgia of temporomandibular joint 12/04/2021   Abnormal weight loss 12/04/2021   Family history of prostate cancer 12/04/2021   Aortic atherosclerosis (HCC) 12/04/2021   Skin lesion of chest wall 12/04/2021   Interstitial lung disease (HCC) 07/16/2019   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 [place] [address] Phone: [phone] Fax: [phone]  [facility] - [place], [place] - [facility] AT Portal to Registered [employer] Sites [address] [facility]  [place] [address] Phone: [phone] Fax: [phone]  [facility] [address] [place], [place] - [address] [place] [address] Phone: [phone] Fax: [phone]

## 2023-03-09 NOTE — ED Provider Notes (Signed)
12:34 AM Assumed care from Dr. Charm Barges, please see their note for full history, physical and decision making until this point. In brief this is a 72 y.o. year old male who presented to the ED tonight with Shortness of Breath     72 year old male was recently discharged from the hospital.  Has unstageable lung issues on 4 to 5 L of oxygen at home.  He states after he got home short period time he noticed that his oxygen machine was not working so he came here for further evaluation.  Has no new complaints.  Social work has been contacted and states he might build to get resolved Sunday morning but it might also be not so after Labor Day.  Will continue to observe him here and transfer him over to [facility] later in the morning for boarding to get oxygen.  Hospitalist refused admission.  On my eval he feels well. Is hungry, snack provided.   On review of his labs it appears his K is slightly high. Has some tall t waves on ECG, some are normal. Will give calcium, lokelma, fluids, recheck. Apparently hospitalist refused admission earlier with this information so will try to correct it but if not improving may need another call to hospitalist for reconsideration.   Patient will be transferred to [facility] for observation pending K recheck and TOC consult.   Labs, studies and imaging reviewed by myself and considered in medical decision making if ordered. Imaging interpreted by radiology.  Labs Reviewed  BASIC METABOLIC PANEL - Abnormal; Notable for the following components:      Result Value   Potassium 5.7 (*)    Chloride 96 (*)    Glucose, Bld 133 (*)    BUN 27 (*)    Calcium 8.8 (*)    All other components within normal limits  CBC WITH DIFFERENTIAL/PLATELET - Abnormal; Notable for the following components:   WBC 10.6 (*)    RBC 6.57 (*)    Hemoglobin 17.9 (*)    HCT 56.6 (*)    RDW 19.2 (*)    nRBC 0.8 (*)    Neutro Abs 8.1 (*)    Lymphs Abs 0.6 (*)    Abs Immature Granulocytes 1.42 (*)     All other components within normal limits    DG Chest [facility] 1 View  Final Result      No follow-ups on file.    Dody Smartt, Barbara Cower, MD 03/09/23 [phone]

## 2023-03-09 NOTE — ED Notes (Signed)
ED TO INPATIENT HANDOFF REPORT  ED Nurse Name and Phone #: Vernona Rieger  [phone]  S Name/Age/Gender Earl Thomas 72 y.o. male Room/Bed: 038C/038C  Code Status   Code Status: Full Code  Home/SNF/Other Home Patient oriented to: self, place, time, and situation Is this baseline? Yes   Triage Complete: Triage complete  Chief Complaint Acute respiratory failure with hypoxia (HCC) [J96.01]  Triage Note Pt states that his O2 regulator has been out for 3 hours. 4L O2 at baseline.   Allergies No Known Allergies  Level of Care/Admitting Diagnosis ED Disposition     ED Disposition  Admit   Condition  --   Comment  Hospital Area: [facility] [[id number]]  Level of Care: Telemetry Medical [[id number]]  May place patient in observation at [facility] or [facility] if equivalent level of care is available:: No  Covid Evaluation: Asymptomatic - no recent exposure (last 10 days) testing not required  Diagnosis: Acute respiratory failure with hypoxia [facility]) [[id number]]  Admitting Physician: Osvaldo Shipper [[id number]]  Attending Physician: Osvaldo Shipper [[id number]]          B Medical/Surgery History Past Medical History:  Diagnosis Date   Abnormal weight loss 12/04/2021   Allergic rhinitis 10/11/2022   Aortic atherosclerosis (HCC) 12/04/2021   Arthralgia of temporomandibular joint 12/04/2021   Arthritis    BMI 26.0-26.9,adult 10/22/2016   Chronic bronchitis (HCC) 06/17/2022   Chronic diastolic CHF (congestive heart failure) (HCC) 01/19/2022   Chronic fatigue syndrome 12/04/2021   Chronic hypoxic respiratory failure (HCC) 08/30/2022   Dyslipidemia 03/10/2012   Family history of prostate cancer 12/04/2021   GERD (gastroesophageal reflux disease) 01/19/2022   Glucose intolerance (impaired glucose tolerance)    Hemorrhage of rectum and anus 10/17/2022   Hyperlipidemia    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 LUQ 10/03/22  1221  LUQ  157            Intake/Output Last 24 hours  Intake/Output Summary (Last 24 hours) at 03/09/2023 1424 Last data filed at 03/09/2023 0934 Gross per 24 hour  Intake --  Output 600 ml  Net -600 ml    Labs/Imaging Results for orders placed or performed during the hospital encounter of 03/08/23 (from the past 48 hour(s))  Basic metabolic panel     Status: Abnormal   Collection Time: 03/08/23  9:40 PM  Result Value Ref Range  Sodium 136 135 - 145 mmol/L   Potassium 5.7 (H) 3.5 - 5.1 mmol/L   Chloride 96 (L) 98 - 111 mmol/L   CO2 30 22 - 32 mmol/L   Glucose, Bld 133 (H) 70 - 99 mg/dL    Comment: Glucose reference range applies only to samples taken after fasting for at least 8 hours.   BUN 27 (H) 8 - 23 mg/dL   Creatinine, Ser 0.27 0.61 - 1.24 mg/dL   Calcium 8.8 (L) 8.9 - 10.3 mg/dL    GFR, Estimated >25 >36 mL/min    Comment: (NOTE) Calculated using the CKD-EPI Creatinine Equation (2021)    Anion gap 10 5 - 15    Comment: Performed at [facility], [address] [place], [place] [address]  CBC with Differential     Status: Abnormal   Collection Time: 03/08/23  9:40 PM  Result Value Ref Range   WBC 10.6 (H) 4.0 - 10.5 K/uL   RBC 6.57 (H) 4.22 - 5.81 MIL/uL   Hemoglobin 17.9 (H) 13.0 - 17.0 g/dL   HCT 47.4 (H) 25.9 - 56.3 %   MCV 86.1 80.0 - 100.0 fL   MCH 27.2 26.0 - 34.0 pg   MCHC 31.6 30.0 - 36.0 g/dL   RDW 87.5 (H) 64.3 - 32.9 %   Platelets 218 150 - 400 K/uL   nRBC 0.8 (H) 0.0 - 0.2 %   Neutrophils Relative % 77 %   Neutro Abs 8.1 (H) 1.7 - 7.7 K/uL   Lymphocytes Relative 5 %   Lymphs Abs 0.6 (L) 0.7 - 4.0 K/uL   Monocytes Relative 5 %   Monocytes Absolute 0.5 0.1 - 1.0 K/uL   Eosinophils Relative 0 %   Eosinophils Absolute 0.0 0.0 - 0.5 K/uL   Basophils Relative 0 %   Basophils Absolute 0.0 0.0 - 0.1 K/uL   WBC Morphology TOXIC GRANULATION    Immature Granulocytes 13 %   Abs Immature Granulocytes 1.42 (H) 0.00 - 0.07 K/uL   Stomatocytes PRESENT     Comment: Performed at [facility], [address] [place], [place] [address]   DG Chest Port 1 View  Result Date: 03/08/2023 CLINICAL DATA:  Shortness of breath EXAM: PORTABLE CHEST 1 VIEW COMPARISON:  03/03/2023 FINDINGS: Severe chronic lung disease, stable. No definite acute process. No effusions. Cardiomediastinal contours within normal limits. IMPRESSION: Severe chronic lung disease.  No definite acute process. Electronically Signed   By: Charlett Nose M.D.   On: 03/08/2023 23:28    Pending Labs Unresulted Labs (From admission, onward)     Start     Ordered   03/10/23 0500  Basic metabolic panel  Tomorrow morning,   R        03/09/23 1419   03/10/23 0500  CBC  Tomorrow morning,   R        03/09/23 1419   03/09/23 1412  CBC  Once,   STAT        03/09/23 1411   03/09/23  1412  Basic metabolic panel  Once,   STAT        03/09/23 1411            Vitals/Pain Today's Vitals   03/09/23 1115 03/09/23 1249 03/09/23 1400 03/09/23 1423  BP:  111/77 115/78   Pulse:  (!) 103 95   Resp:  18    Temp:  98.4 F (36.9 C)  98.2 F (36.8 C)  TempSrc:  Oral  Oral  SpO2: 95% 98% 100%   Weight:      Height:      PainSc:        Isolation Precautions No active isolations  Medications Medications  nystatin (MYCOSTATIN) 100000 UNIT/ML suspension 500,000 Units (has no administration in time range)  aspirin EC tablet 81 mg (has no administration in time range)  megestrol (MEGACE) 40 MG/ML suspension 400 mg (has no administration in time range)  atorvastatin (LIPITOR) tablet 10 mg (has no administration in time range)  carvedilol (COREG) tablet 3.125 mg (has no administration in time range)  mirabegron ER (MYRBETRIQ) tablet 25 mg (has no administration in time range)  arformoterol (BROVANA) nebulizer solution 15 mcg (has no administration in time range)  budesonide (PULMICORT) nebulizer solution 0.25 mg (has no administration in time range)  ipratropium-albuterol (DUONEB) 0.5-2.5 (3) MG/3ML nebulizer solution 3 mL (has no administration in time range)  methylPREDNISolone sodium succinate (SOLU-MEDROL) 40 mg/mL injection 40 mg (has no administration in time range)  enoxaparin (LOVENOX) injection 40 mg (has no administration in time range)  acetaminophen (TYLENOL) tablet 650 mg (has no administration in time range)    Or  acetaminophen (TYLENOL) suppository 650 mg (has no administration in time range)  oxyCODONE (Oxy IR/ROXICODONE) immediate release tablet 5 mg (has no administration in time range)  ondansetron (ZOFRAN) tablet 4 mg (has no administration in time range)    Or  ondansetron (ZOFRAN) injection 4 mg (has no administration in time range)  sodium zirconium cyclosilicate (LOKELMA) packet 10 g (10 g Oral Given 03/09/23 0446)  lactated ringers bolus 500 mL (0  mLs Intravenous Stopped 03/09/23 0559)  calcium gluconate 1 g/ 50 mL sodium chloride IVPB (0 mg Intravenous Stopped 03/09/23 0559)    Mobility walks     Focused Assessments Pulmonary Assessment Handoff:  Lung sounds:   O2 Device: Room Air O2 Flow Rate (L/min): 4 L/min    R Recommendations: See Admitting Provider Note  Report given to:   Additional Notes: Wear 4L [place] at home/baseline

## 2023-03-09 NOTE — ED Notes (Signed)
[facility] has arrived to transport pt to [facility] ED, charge RN notified that [facility] will arrive momentary

## 2023-03-10 DIAGNOSIS — J9601 Acute respiratory failure with hypoxia: Secondary | ICD-10-CM | POA: Diagnosis not present

## 2023-03-10 LAB — BASIC METABOLIC PANEL
Anion gap: 9 (ref 5–15)
BUN: 19 mg/dL (ref 8–23)
CO2: 32 mmol/L (ref 22–32)
Calcium: 8.3 mg/dL — ABNORMAL LOW (ref 8.9–10.3)
Chloride: 92 mmol/L — ABNORMAL LOW (ref 98–111)
Creatinine, Ser: 0.88 mg/dL (ref 0.61–1.24)
GFR, Estimated: >60 mL/min (ref >60)
Glucose, Bld: 122 mg/dL — ABNORMAL HIGH (ref 70–99)
Potassium: 4.9 mmol/L (ref 3.5–5.1)
Sodium: 133 mmol/L — ABNORMAL LOW (ref 135–145)

## 2023-03-10 LAB — CBC
HCT: 51.5 % (ref 39.0–52.0)
Hemoglobin: 16.5 g/dL (ref 13.0–17.0)
MCH: 27.3 pg (ref 26.0–34.0)
MCHC: 32 g/dL (ref 30.0–36.0)
MCV: 85.1 fL (ref 80.0–100.0)
Platelets: 217 10*3/uL (ref 150–400)
RBC: 6.05 MIL/uL — ABNORMAL HIGH (ref 4.22–5.81)
RDW: 18.8 % — ABNORMAL HIGH (ref 11.5–15.5)
WBC: 11.5 10*3/uL — ABNORMAL HIGH (ref 4.0–10.5)
nRBC: 0.3 % — ABNORMAL HIGH (ref 0.0–0.2)

## 2023-03-10 MED ORDER — NYSTATIN 100000 UNIT/ML MT SUSP: 5.0000 mL | Freq: Four times a day (QID) | OROMUCOSAL | 0 refills | Status: AC

## 2023-03-10 NOTE — Progress Notes (Signed)
[facility] PROGRESS NOTE   Earl Thomas MVH:[id number] DOB: Jul 09, 1950 DOA: 03/08/2023  PCP: Shayne Alken, MD  Brief History/Interval Summary: 72 y.o. male with a past medical history of rheumatoid arthritis associated lung disease, interstitial lung disease, chronic respiratory failure with hypoxia, on 4 L of oxygen by nasal cannula at home, who presented due to shortness of breath.  Patient was hospitalized with COVID about a month ago.  He spent a few days in the ICU and then was transferred to [facility] and then followed by rehab.  He was released from rehab on August 30.  He was set up with a oxygen concentrator.  While trying to use the concentrator at home, patient and his wife realized that the concentrator was not working well.  He was not getting any oxygen.  He had a portable tank which he started using but it was not adequate.  He started feeling more short of breath.  Decided to come into the emergency department.  When he was ambulated he felt more short of breath than usual.  Required 10 L of oxygen which is normally not the case.  He was hospitalized for further management  Consultants: None  Procedures: None    Subjective/Interval History: Patient mentions that he is feeling better this morning.  Shortness of breath is improved.  Slept well overnight.  No nausea vomiting.    Assessment/Plan:  Acute on chronic respiratory failure with hypoxia At baseline he uses 4 L of oxygen.  Was recently released from rehab just on August 30.  His concentrator which was arranged for him was not functioning properly resulting in worsening shortness of breath.  He came into the emergency department.  Noted to be more short of breath than usual with ambulation requiring 10 L of oxygen which is not usual for him. He was brought into the hospital.  Started on IV steroids as there was concern that this could be ILD exacerbation.  Chest x-ray did not show any acute  findings.   He recently recovered from COVID-19 about a month ago.  That is likely contributing to some extent. TOC has been consulted to address home oxygen issue. Continue steroids for now.  Anticipate transition to oral steroids from tomorrow.  He was already on a steroid taper prior to admission which can be resumed at discharge. He was also on antibiotics when he came into the hospital.  Augmentin is being continued.  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  Follows with pulmonology on a regular basis.  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  Hyperkalemia Resolved  Oral thrush Nystatin  DVT Prophylaxis: Lovenox Code Status: Full code Family Communication: Discussed with patient and his wife Disposition Plan: Home when oxygen concentrator has been arranged, hopefully tomorrow  Status is: Observation The patient will require care spanning > 2 midnights and should be moved to inpatient because: Faulty oxygen concentrator at home.      Medications: Scheduled:  amoxicillin-clavulanate  1 tablet Oral Q12H   arformoterol  15 mcg Nebulization BID   aspirin EC  81 mg Oral q morning   atorvastatin  10 mg Oral Daily   budesonide  0.25 mg Nebulization BID   carvedilol  3.125 mg Oral BID WC   enoxaparin (LOVENOX) injection  40 mg Subcutaneous Q24H   feeding supplement (OSMOLITE 1.2 CAL)  237 mL Per Tube TID WC   megestrol  400 mg Oral Daily   methylPREDNISolone (  SOLU-MEDROL) injection  40 mg Intravenous Q12H   mirabegron ER  25 mg Oral Daily   nystatin  5 mL Oral QID   Continuous: WGN:[id number] **OR** acetaminophen, ipratropium-albuterol, ondansetron **OR** ondansetron (ZOFRAN) IV, oxyCODONE  Antibiotics: Anti-infectives (From admission, onward)    Start     Dose/Rate Route Frequency Ordered Stop   03/09/23 1445  amoxicillin-clavulanate (AUGMENTIN) 875-125 MG per tablet 1 tablet        1 tablet Oral Every 12 hours  03/09/23 1433 03/12/23 0959       Objective:  Vital Signs  Vitals:   03/09/23 1800 03/09/23 2011 03/10/23 0543 03/10/23 0754  BP:  105/74 112/78 104/78  Pulse:  94 (!) 101 (!) 103  Resp:  20 20 16   Temp:  97.8 F (36.6 C) 97.7 F (36.5 C) 98.4 F (36.9 C)  TempSrc:  Oral Oral   SpO2:  100% 100% 100%  Weight: 49.1 kg     Height: 5\' 5"  (1.651 m)       Intake/Output Summary (Last 24 hours) at 03/10/2023 0952 Last data filed at 03/10/2023 0544 Gross per 24 hour  Intake 340 ml  Output 490 ml  Net -150 ml   Filed Weights   03/08/23 2132 03/09/23 1800  Weight: 45.9 kg 49.1 kg    General appearance: Awake alert.  In no distress Resp: Coarse sounds with crackles bilaterally.  No wheezing heard today. Cardio: S1-S2 is normal regular.  No S3-S4.  No rubs murmurs or bruit GI: Abdomen is soft.  Nontender nondistended.  Bowel sounds are present normal.  No masses organomegaly Extremities: No edema.  Full range of motion of lower extremities. Neurologic: Alert and oriented x3.  No focal neurological deficits.    Lab Results:  Data Reviewed: I have personally reviewed following labs and reports of the imaging studies  CBC: Recent Labs  Lab 03/08/23 2140 03/09/23 1727 03/10/23 0541  WBC 10.6* 9.9 11.5*  NEUTROABS 8.1*  --   --   HGB 17.9* 16.5 16.5  HCT 56.6* 51.8 51.5  MCV 86.1 84.5 85.1  PLT 218 233 217    Basic Metabolic Panel: Recent Labs  Lab 03/08/23 2140 03/09/23 1550 03/10/23 0541  NA 136 134* 133*  K 5.7* 4.5 4.9  CL 96* 93* 92*  CO2 30 31 32  GLUCOSE 133* 121* 122*  BUN 27* 21 19  CREATININE 0.87 1.07 0.88  CALCIUM 8.8* 8.4* 8.3*    GFR: Estimated Creatinine Clearance: 55 mL/min (by C-G formula based on SCr of 0.88 mg/dL).    Radiology Studies: DG CHEST PORT 1 VIEW  Result Date: 03/09/2023 CLINICAL DATA:  Hypoxia. EXAM: PORTABLE CHEST 1 VIEW COMPARISON:  03/08/2023 FINDINGS: Advanced changes of chronic interstitial lung disease again noted. No  new focal consolidative opacity. No pleural effusion. The cardiopericardial silhouette is within normal limits for size. No acute bony abnormality. Gastrostomy tube overlies the left upper quadrant. IMPRESSION: Advanced changes of chronic interstitial lung disease without new focal airspace consolidation. Electronically Signed   By: Kennith Center M.D.   On: 03/09/2023 15:14   DG Chest Port 1 View  Result Date: 03/08/2023 CLINICAL DATA:  Shortness of breath EXAM: PORTABLE CHEST 1 VIEW COMPARISON:  03/03/2023 FINDINGS: Severe chronic lung disease, stable. No definite acute process. No effusions. Cardiomediastinal contours within normal limits. IMPRESSION: Severe chronic lung disease.  No definite acute process. Electronically Signed   By: Charlett Nose M.D.   On: 03/08/2023 23:28       LOS:  0 days   [employer]  [facility] Pager on [email or web address]  03/10/2023, 9:52 AM

## 2023-03-10 NOTE — Care Management Obs Status (Signed)
MEDICARE OBSERVATION STATUS NOTIFICATION   Patient Details  Name: Earl Thomas MRN: [id number] Date of Birth: 10/09/1950   Medicare Observation Status Notification Given:  Yes    Tom-Johnson, Hershal Coria, RN 03/10/2023, 3:45 PM

## 2023-03-10 NOTE — Discharge Summary (Signed)
[facility]  Physician Discharge Summary   Patient ID: Earl Thomas MRN: [id number] DOB/AGE: 08-21-1950 72 y.o.  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 male with a past medical history of rheumatoid arthritis associated lung disease, interstitial lung disease, chronic respiratory failure with hypoxia, on 4 L of oxygen by nasal cannula at home, who presented due to shortness of breath.  Patient was hospitalized with COVID about a month ago.  He spent a few days in the ICU and then was transferred to [facility] and then followed by rehab.  He was released from rehab on August 30.  He was set up with a oxygen concentrator.  While trying to use the concentrator at home, patient and his wife realized that the concentrator was not working well.  He was not getting any oxygen.  He had a portable tank which he started using but it was not adequate.  He started feeling more short of breath.  Decided to come into the emergency department.  When he was ambulated he felt more short of breath than usual.  Required 10 L of oxygen which is normally not the case.  He was hospitalized for further management     HOSPITAL COURSE:   Acute on chronic respiratory failure with hypoxia At baseline he uses 4 L of oxygen.  Was recently released from rehab just on August 30.  His concentrator which was arranged for him was not functioning properly resulting in worsening shortness of breath.  He  came into the emergency department.  Noted to be more short of breath than usual with ambulation requiring 10 L of oxygen which is not usual for him. He was brought into the hospital.  Started on IV steroids as there was concern that this could be ILD exacerbation.  Chest x-ray did not show any acute findings.   He recently recovered from COVID-19 about a month ago.  That is likely contributing to some extent. TOC has been consulted to address home oxygen issue. Continue steroids for now.  Anticipate transition to oral steroids from tomorrow.  He was already on a steroid taper prior to admission which can be resumed at discharge. He was also on antibiotics when he came into the hospital.  Augmentin is being continued.   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  Follows with pulmonology on a regular basis.  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   Hyperkalemia Resolved   Oral thrush Nystatin  Okay for discharge once home oxygen has been arranged.  Discussed with patient and his wife.    PERTINENT LABS:  The results of significant diagnostics from this hospitalization (including imaging, microbiology, ancillary and laboratory) are listed below for reference.      Labs:   Basic Metabolic Panel: Recent Labs  Lab 03/08/23 2140 03/09/23 1550 03/10/23 0541  NA 136 134* 133*  K 5.7* 4.5 4.9  CL 96* 93* 92*  CO2 30 31 32  GLUCOSE 133* 121* 122*  BUN 27* 21 19  CREATININE 0.87 1.07 0.88  CALCIUM 8.8* 8.4* 8.3*    CBC: Recent Labs  Lab 03/08/23 2140 03/09/23 1727 03/10/23 0541  WBC 10.6* 9.9 11.5*  NEUTROABS 8.1*  --   --   HGB 17.9* 16.5 16.5  HCT 56.6* 51.8 51.5  MCV 86.1 84.5 85.1  PLT 218 233 217      IMAGING STUDIES DG CHEST PORT 1 VIEW  Result Date: 03/09/2023 CLINICAL DATA:  Hypoxia. EXAM: PORTABLE CHEST 1 VIEW COMPARISON:  03/08/2023 FINDINGS: Advanced changes of chronic interstitial lung disease again  noted. No new focal consolidative opacity. No pleural effusion. The cardiopericardial silhouette is within normal limits for size. No acute bony abnormality. Gastrostomy tube overlies the left upper quadrant. IMPRESSION: Advanced changes of chronic interstitial lung disease without new focal airspace consolidation. Electronically Signed   By: Kennith Center M.D.   On: 03/09/2023 15:14   DG Chest Port 1 View  Result Date: 03/08/2023 CLINICAL DATA:  Shortness of breath EXAM: PORTABLE CHEST 1 VIEW COMPARISON:  03/03/2023 FINDINGS: Severe chronic lung disease, stable. No definite acute process. No effusions. Cardiomediastinal contours within normal limits. IMPRESSION: Severe chronic lung disease.  No definite acute process. Electronically Signed   By: Charlett Nose M.D.   On: 03/08/2023 23:28   DG Chest Port 1 View  Result Date: 02/24/2023 CLINICAL DATA:  Chest pain EXAM: PORTABLE CHEST 1 VIEW COMPARISON:  02/04/2023, CT 12/30/2022 FINDINGS: Severe chronic interstitial lung disease changes again noted, unchanged. No definite acute confluent opacities or effusions. No pneumothorax. Cardiomediastinal contours within normal limits. No acute bony abnormality. IMPRESSION: Severe chronic interstitial lung disease. No definite acute abnormality. Electronically Signed   By: Charlett Nose M.D.   On: 02/24/2023 18:23    DISCHARGE EXAMINATION: See progress note from earlier today  DISPOSITION: Home with wife  Discharge Instructions     Call MD for:  difficulty breathing, headache or visual disturbances   Complete by: As directed    Call MD for:  extreme fatigue   Complete by: As directed    Call MD for:  persistant dizziness or light-headedness   Complete by: As directed    Call MD for:  persistant nausea and vomiting   Complete by: As directed    Call MD for:  severe uncontrolled pain   Complete by: As directed    Call MD for:  temperature >100.4   Complete by: As directed    Diet general   Complete by:  As directed    Discharge instructions   Complete by: As directed    Please be sure to follow-up with your pulmonologist.  Take your medications as prescribed.  You were cared for by a hospitalist during your hospital stay. If you have any questions about your discharge medications or the care you received while you were in the hospital after you are discharged, you can call the unit and asked to speak with the hospitalist on call if the hospitalist that took care of you is not available. Once you are discharged, your primary care physician will handle any further medical issues. Please note that NO REFILLS for any discharge medications will be authorized once you are discharged, as it is imperative that you return to your primary care physician (or establish a relationship with a primary care physician if you do not have one) for your aftercare needs so that they can reassess your need for medications and monitor your lab values. If you do not have  a primary care physician, you can call [phone] for a physician referral.   Increase activity slowly   Complete by: As directed           Allergies as of 03/10/2023   No Known Allergies      Medication List     STOP taking these medications    amoxicillin-clavulanate 400-57 MG/5ML suspension Commonly known as: AUGMENTIN   arformoterol 15 MCG/2ML Nebu Commonly known as: BROVANA   atorvastatin 10 MG tablet Commonly known as: LIPITOR   budesonide 0.25 MG/2ML nebulizer solution Commonly known as: PULMICORT   ipratropium-albuterol 0.5-2.5 (3) MG/3ML Soln Commonly known as: DUONEB   revefenacin 175 MCG/3ML nebulizer solution Commonly known as: YUPELRI   thiamine 100 MG tablet Commonly known as: Vitamin B-1       TAKE these medications    albuterol 108 (90 Base) MCG/ACT inhaler Commonly known as: VENTOLIN HFA Inhale 1-2 puffs into the lungs every 6 (six) hours as needed for wheezing or shortness of breath.   ascorbic acid 500  MG tablet Commonly known as: VITAMIN C Take 500 mg by mouth daily.   aspirin EC 81 MG tablet Take 81 mg by mouth every morning.   azelastine 0.1 % nasal spray Commonly known as: ASTELIN Place 2 sprays into both nostrils 2 (two) times daily. Use in each nostril as directed What changed:  when to take this reasons to take this   carvedilol 3.125 MG tablet Commonly known as: COREG Take 3.125 mg by mouth 2 (two) times daily with a meal.   cholestyramine 4 g packet Commonly known as: QUESTRAN Take 4 g by mouth 2 (two) times daily.   dextromethorphan 30 MG/5ML liquid Commonly known as: DELSYM Take 5 mLs (30 mg total) by mouth at bedtime as needed for cough.   docusate sodium 100 MG capsule Commonly known as: COLACE Take 1 capsule (100 mg total) by mouth 2 (two) times daily as needed for mild constipation.   famotidine 20 MG tablet Commonly known as: PEPCID Take 1 tablet (20 mg total) by mouth 2 (two) times daily.   feeding supplement (OSMOLITE 1.5 CAL) Liqd Place 1,000 mLs into feeding tube continuous.   feeding supplement (PROSource TF20) liquid Place 60 mLs into feeding tube daily.   fluticasone 50 MCG/ACT nasal spray Commonly known as: FLONASE Place 1 spray into both nostrils in the morning and at bedtime.   Gemtesa 75 MG Tabs Generic drug: Vibegron Take 1 tablet (75 mg total) by mouth daily at 6 (six) AM.   guaiFENesin 100 MG/5ML liquid Commonly known as: ROBITUSSIN Take 5 mLs by mouth every 4 (four) hours as needed for cough or to loosen phlegm.   levocetirizine 5 MG tablet Commonly known as: XYZAL TAKE 1 TABLET BY MOUTH ONCE DAILY IN THE EVENING   Lumify 0.025 % Soln Generic drug: Brimonidine Tartrate Place 1 drop into both eyes daily as needed (dry eyes).   megestrol 40 MG/ML suspension Commonly known as: MEGACE Take 400 mg by mouth daily.   multivitamin with minerals Tabs tablet Place 1 tablet into feeding tube daily.   nitroGLYCERIN 0.4 MG SL  tablet Commonly known as: NITROSTAT Place 0.4 mg under the tongue every 5 (five) minutes as needed for chest pain.   nystatin 100000 UNIT/ML suspension Commonly known as: MYCOSTATIN Take 5 mLs (500,000 Units total) by mouth 4 (four) times daily for 7 days.   ondansetron 4 MG disintegrating tablet Commonly known as: ZOFRAN-ODT Take 4 mg by mouth every  6 (six) hours as needed for nausea or vomiting.   polyethylene glycol 17 g packet Commonly known as: MIRALAX / GLYCOLAX Take 17 g by mouth daily as needed for moderate constipation.   predniSONE 10 MG tablet Commonly known as: DELTASONE Take 10-40 mg by mouth See admin instructions. Take 4 tablets (40 mg total) by mouth daily for 4 days, THEN 3 tablets (30 mg total) daily for 5 days, THEN 2 tablets (20 mg total) daily for 5 days, THEN 1 tablet (10 mg total) daily for 5 days.   RITUXAN IV Inject into the vein.   umeclidinium-vilanterol 62.5-25 MCG/ACT Aepb Commonly known as: ANORO ELLIPTA Inhale 1 puff into the lungs daily.   Xyosted 75 MG/0.5ML Soaj Generic drug: Testosterone Enanthate Inject 75 mg into the skin once a week.          Follow-up Information     [facility] [employer] Follow up.   Why: Oxygen company Contact information: [address] [place] [address] [phone]         Shayne Alken, MD. Call.   Why: As needed Contact information: [address] [place] [address] [phone]         Charlott Holler, MD Follow up.   Specialty: Pulmonary Disease Contact information: [address] [place] [address] [phone]                 TOTAL DISCHARGE TIME: 35 minutes  Britani Beattie Rito Ehrlich  [facility] Pager on [email or web address]  03/11/2023, 10:18 AM

## 2023-03-10 NOTE — Plan of Care (Signed)
  Problem: Clinical Measurements: Goal: Respiratory complications will improve Outcome: Not Progressing   Problem: Activity: Goal: Risk for activity intolerance will decrease Outcome: Not Progressing   Problem: Safety: Goal: Ability to remain free from injury will improve Outcome: Not Progressing   

## 2023-03-10 NOTE — Progress Notes (Signed)
SATURATION QUALIFICATIONS: (This note is used to comply with regulatory documentation for home oxygen)  Patient Saturations on Room Air at Rest = 87%  Patient Saturations on Room Air while Ambulating = 83%  Patient Saturations on 5 Liters of oxygen while Ambulating = 96%  Please briefly explain why patient needs home oxygen:  pt is currently prescribed 5L at home but wife states that pt may need more liters. She said that there have been events that have made her increase the O2 level to 10L.

## 2023-03-10 NOTE — Progress Notes (Signed)
Pt portable delivered to room.

## 2023-03-10 NOTE — TOC Transition Note (Signed)
Transition of Care [facility]) - CM/SW Discharge Note   Patient Details  Name: Earl Thomas MRN: [id number] Date of Birth: May 20, 1951  [facility]) CM/SW Contact:  Tom-Johnson, Hershal Coria, RN Phone Number: 03/10/2023, 3:41 PM   Clinical Narrative:     Patient is scheduled for discharge today.  Outpatient referral, hospital f/u and discharge instructions on AVS. Home O2 from [place], Jermaine to deliver portable tank to patient at bedside and wife, Luster Landsberg to call [employer] when they get home for Concentrator to be delivered to home. Wife, Luster Landsberg to transport at discharge.  No further TOC needs noted.        Final next level of care: Home/Self Care Barriers to Discharge: Barriers Resolved   Patient Goals and CMS Choice CMS Medicare.gov Compare Post Acute Care list provided to:: Patient Choice offered to / list presented to : Patient, Spouse  Discharge Placement                  Patient to be transferred to facility by: Wife Name of family member notified: Renee    Discharge Plan and Services Additional resources added to the After Visit Summary for                  DME Arranged: Oxygen DME Agency: [employer] Date DME Agency Contacted: 03/10/23 Time DME Agency Contacted: 1440 Representative spoke with at DME Agency: Vaughan Basta HH Arranged: NA HH Agency: NA        Social Determinants of Health (SDOH) Interventions SDOH Screenings   Food Insecurity: No Food Insecurity (03/09/2023)  Housing: Low Risk  (03/09/2023)  Transportation Needs: No Transportation Needs (03/09/2023)  Utilities: Not At Risk (03/09/2023)  Alcohol Screen: Low Risk  (12/09/2022)  Depression (PHQ2-9): Low Risk  (01/20/2023)  Financial Resource Strain: Low Risk  (12/09/2022)  Physical Activity: Inactive (12/09/2022)  Social Connections: Socially Integrated (12/09/2022)  Stress: No Stress Concern Present (02/25/2023)   Received from [facility]  Tobacco Use: Medium Risk  (03/08/2023)     Readmission Risk Interventions    10/14/2022    2:38 PM  Readmission Risk Prevention Plan  Transportation Screening Complete  PCP or Specialist Appt within 5-7 Days Complete  Home Care Screening Complete  Medication Review (RN CM) Complete

## 2023-03-10 NOTE — Progress Notes (Signed)
SATURATION QUALIFICATIONS: (This note is used to comply with regulatory documentation for home oxygen)  Patient Saturations on Room Air at Rest = 97%   Patient Saturations on Room Air while Ambulating = didn't try (pt was out of breath on 5L)  Patient Saturations on 5 Liters of oxygen while Ambulating = 96%  Please briefly explain why patient needs home oxygen: pt could only ambulate 3 feet out of the door with 5L on. He was 96% but labored breathing and stated he was out of breath.

## 2023-03-11 ENCOUNTER — Ambulatory Visit: Payer: Medicare Other | Admitting: Internal Medicine

## 2023-03-11 ENCOUNTER — Encounter: Payer: Self-pay | Admitting: Internal Medicine

## 2023-03-11 ENCOUNTER — Ambulatory Visit (INDEPENDENT_AMBULATORY_CARE_PROVIDER_SITE_OTHER): Payer: Medicare Other | Admitting: Internal Medicine

## 2023-03-11 ENCOUNTER — Other Ambulatory Visit: Payer: Self-pay | Admitting: Internal Medicine

## 2023-03-11 ENCOUNTER — Telehealth: Payer: Self-pay | Admitting: *Deleted

## 2023-03-11 ENCOUNTER — Encounter: Payer: Self-pay | Admitting: *Deleted

## 2023-03-11 VITALS — BP 108/64 | HR 102 | Ht 65.0 in | Wt 94.8 lb

## 2023-03-11 DIAGNOSIS — J9611 Chronic respiratory failure with hypoxia: Secondary | ICD-10-CM | POA: Diagnosis not present

## 2023-03-11 DIAGNOSIS — J849 Interstitial pulmonary disease, unspecified: Secondary | ICD-10-CM | POA: Diagnosis not present

## 2023-03-11 DIAGNOSIS — M069 Rheumatoid arthritis, unspecified: Secondary | ICD-10-CM | POA: Diagnosis not present

## 2023-03-11 MED ORDER — REVEFENACIN 175 MCG/3ML IN SOLN: 175.0000 ug | Freq: Every day | RESPIRATORY_TRACT | 3 refills | Status: DC

## 2023-03-11 MED ORDER — ARFORMOTEROL TARTRATE 15 MCG/2ML IN NEBU: 15.0000 ug | INHALATION_SOLUTION | Freq: Two times a day (BID) | RESPIRATORY_TRACT | 6 refills | Status: DC

## 2023-03-11 NOTE — Transitions of Care (Post Inpatient/ED Visit) (Signed)
03/11/2023  Name: Earl Thomas MRN: [id number] DOB: 1950-07-30  Today's TOC FU Call Status: Today's TOC FU Call Status:: Successful TOC FU Call Completed TOC FU Call Complete Date: 03/11/23 Patient's Name and Date of Birth confirmed.  Transition Care Management Follow-up Telephone Call Date of Discharge: 03/10/23 Discharge Facility: [facility]) Type of Discharge: Inpatient Admission Primary Inpatient Discharge Diagnosis:: Acute on chronic Respiratory Failure- home O2 malfunction after SNF/ rehab discharge on 03/07/23 How have you been since you were released from the hospital?: Better ("I am doing fine; the new oxygen machine seems to be working fine.  I have a home health nurse that comes once a week.  My wife handles all of my medications and my tube feeding.  Everything is going smoothly so far") Any questions or concerns?: No  Items Reviewed: Did you receive and understand the discharge instructions provided?: Yes (thoroughly reviewed with patient who verbalizes good understanding of same) Medications obtained,verified, and reconciled?: No (Patient declined med review; wife manages medications and is  not present at home to review during [facility] call; patient denies questions/ concerns around medications today) Medications Not Reviewed Reasons:: Other: (patient declined- wife manages medications and is not present to review during TOC call) Any new allergies since your discharge?: No Dietary orders reviewed?: Yes Type of Diet Ordered:: "Easily digestible foods; tube feedings; I do eat some food by mouth" Do you have support at home?: Yes People in Home: spouse Name of Support/Comfort Primary Source: Reports essentially independent in self-care activities; supportive spouse manages medications and tube feeding;  spouse assists as/ if needed/ indicated  Medications Reviewed Today: Medications Reviewed Today     Reviewed by Michaela Corner, RN (Registered Nurse) on 03/11/23 at  1334  Med List Status: <None>   Medication Order Taking? Sig Documenting Provider Last Dose Status Informant  albuterol (VENTOLIN HFA) 108 (90 Base) MCG/ACT inhaler [id number] No Inhale 1-2 puffs into the lungs every 6 (six) hours as needed for wheezing or shortness of breath. Charlott Holler, MD 03/08/2023 Active Spouse/Significant Other           Med Note Michaela Corner   Tue Mar 11, 2023 12:58 PM) 03/11/23: reports during [facility] call unable to review medications- wife not at home- she manages medications  ascorbic acid (VITAMIN C) 500 MG tablet [id number] No Take 500 mg by mouth daily. [provider] 03/08/2023 Active Spouse/Significant Other  aspirin EC 81 MG tablet [id number] No Take 81 mg by mouth every morning. [provider] 03/08/2023 Active Spouse/Significant Other  azelastine (ASTELIN) 0.1 % nasal spray [id number] No Place 2 sprays into both nostrils 2 (two) times daily. Use in each nostril as directed  Patient taking differently: Place 2 sprays into both nostrils 2 (two) times daily as needed for rhinitis or allergies (congestion). Use in each nostril as directed   Ferol Luz, MD Past Week Active Spouse/Significant Other  Brimonidine Tartrate (LUMIFY) 0.025 % SOLN [id number] No Place 1 drop into both eyes daily as needed (dry eyes). [provider] Past Week Active Spouse/Significant Other  carvedilol (COREG) 3.125 MG tablet [id number] No Take 3.125 mg by mouth 2 (two) times daily with a meal. [provider] 03/09/2023 0800 Active Spouse/Significant Other  cholestyramine (QUESTRAN) 4 g packet [id number] No Take 4 g by mouth 2 (two) times daily. [provider] 03/08/2023 Active Spouse/Significant Other  dextromethorphan (DELSYM) 30 MG/5ML liquid [id number] No Take 5 mLs (30 mg total) by mouth at bedtime as needed  for cough. Marrianne Mood, MD Past Month Active Spouse/Significant Other           Med Note (SATTERFIELD, Genoveva Ill   Sun Mar 09, 2023  6:04  PM) Still has on hand   docusate sodium (COLACE) 100 MG capsule [id number] No Take 1 capsule (100 mg total) by mouth 2 (two) times daily as needed for mild constipation. Marrianne Mood, MD 03/08/2023 Active Spouse/Significant Other  famotidine (PEPCID) 20 MG tablet [id number] No Take 1 tablet (20 mg total) by mouth 2 (two) times daily. Georgina Quint, MD 03/08/2023 Active Spouse/Significant Other  fluticasone (FLONASE) 50 MCG/ACT nasal spray [id number] No Place 1 spray into both nostrils in the morning and at bedtime. [provider] Past Week Active Spouse/Significant Other  guaiFENesin (ROBITUSSIN) 100 MG/5ML liquid [id number] No Take 5 mLs by mouth every 4 (four) hours as needed for cough or to loosen phlegm. Marrianne Mood, MD Past Week Active Spouse/Significant Other  levocetirizine (XYZAL) 5 MG tablet [id number] No TAKE 1 TABLET BY MOUTH ONCE DAILY IN THE Ellender Hose, MD 03/09/2023 Active Spouse/Significant Other  megestrol (MEGACE) 40 MG/ML suspension [id number] No Take 400 mg by mouth daily. [provider] 03/08/2023 Active Spouse/Significant Other  Multiple Vitamin (MULTIVITAMIN WITH MINERALS) TABS tablet [id number] No Place 1 tablet into feeding tube daily. Marrianne Mood, MD Past Week Active Spouse/Significant Other  nitroGLYCERIN (NITROSTAT) 0.4 MG SL tablet [id number] No Place 0.4 mg under the tongue every 5 (five) minutes as needed for chest pain. [provider] unknown Active Spouse/Significant Other  Nutritional Supplements (FEEDING SUPPLEMENT, OSMOLITE 1.5 CAL,) LIQD [id number] No Place 1,000 mLs into feeding tube continuous. Marrianne Mood, MD 03/08/2023 Active Spouse/Significant Other  nystatin (MYCOSTATIN) 100000 UNIT/ML suspension [id number]  Take 5 mLs (500,000 Units total) by mouth 4 (four) times daily for 7 days. Osvaldo Shipper, MD  Active   ondansetron (ZOFRAN-ODT) 4 MG disintegrating tablet [id number] No Take 4 mg by mouth every 6  (six) hours as needed for nausea or vomiting. [provider] unknown Active Spouse/Significant Other  polyethylene glycol (MIRALAX / GLYCOLAX) 17 g packet [id number] No Take 17 g by mouth daily as needed for moderate constipation. Marrianne Mood, MD Past Week Active Spouse/Significant Other  predniSONE (DELTASONE) 10 MG tablet [id number] No Take 10-40 mg by mouth See admin instructions. Take 4 tablets (40 mg total) by mouth daily for 4 days, THEN 3 tablets (30 mg total) daily for 5 days, THEN 2 tablets (20 mg total) daily for 5 days, THEN 1 tablet (10 mg total) daily for 5 days. [provider] 03/09/2023 Active Spouse/Significant Other           Med Note (SATTERFIELD, DARIUS E   Sun Mar 09, 2023  5:57 PM) Started on 03-07-23  Protein (FEEDING SUPPLEMENT, PROSOURCE TF20,) liquid [id number] No Place 60 mLs into feeding tube daily. Marrianne Mood, MD unknown Active Spouse/Significant Other  riTUXimab (RITUXAN IV) [id number] No Inject into the vein. [provider] >8months Active Spouse/Significant Other           Med Note (SATTERFIELD, Genoveva Ill   Sun Mar 09, 2023  6:03 PM) Patient and family member states he is due for a dose this month in September however not sure of exact date this month   Testosterone Enanthate (XYOSTED) 75 MG/0.5ML SOAJ [id number] No Inject 75 mg into the skin once a week. Georgina Quint, MD Past Week Active Spouse/Significant Other           Med Note (  SATTERFIELD, Jaynie Crumble Mar 09, 2023  6:06 PM) No specific day and spouse patient and spouse can't remember what exact date taken last week   umeclidinium-vilanterol (ANORO ELLIPTA) 62.5-25 MCG/ACT AEPB [id number] No Inhale 1 puff into the lungs daily. [provider] 03/08/2023 Active Spouse/Significant Other  Vibegron (GEMTESA) 75 MG TABS [id number] No Take 1 tablet (75 mg total) by mouth daily at 6 (six) AM.  Patient not taking: Reported on 03/09/2023   Georgina Quint, MD Not Taking  Active Spouse/Significant Other           Home Care and Equipment/Supplies: Were Home Health Services Ordered?: No (No new home health orders noted: patient reports previously established home health RN "once a week" post-recent SNF / rehab discharge on 03/07/23-- [facility]") Any new equipment or medical supplies ordered?: Yes Name of Medical supply agency?: [employer]- new home O2 concentrator Were you able to get the equipment/medical supplies?: Yes Do you have any questions related to the use of the equipment/supplies?: No  Functional Questionnaire: Do you need assistance with bathing/showering or dressing?: Yes (wife supervises/ assists as indicated) Do you need assistance with meal preparation?: Yes (wife supervises/ assists as indicated) Do you need assistance with eating?: Yes (wife supervises/ assists as indicated- wife manages tube feedings) Do you have difficulty maintaining continence: No Do you need assistance with getting out of bed/getting out of a chair/moving?: Yes (wife supervises/ assists as indicated) Do you have difficulty managing or taking your medications?: Yes (wife manages all aspects of medication administration)  Follow up appointments reviewed: PCP Follow-up appointment confirmed?: Yes Date of PCP follow-up appointment?: 03/17/23 Follow-up Provider: PCP Specialist Hospital Follow-up appointment confirmed?: Yes Date of Specialist follow-up appointment?: 03/11/23 Follow-Up Specialty Provider:: pulmonary provider Do you need transportation to your follow-up appointment?: No Do you understand care options if your condition(s) worsen?: Yes-patient verbalized understanding  SDOH Interventions Today    Flowsheet Row Most Recent Value  SDOH Interventions   Food Insecurity Interventions Intervention Not Indicated  Transportation Interventions Intervention Not Indicated  [spouse provides transportation]      TOC Interventions Today    Flowsheet Row Most Recent  Value  TOC Interventions   TOC Interventions Discussed/Reviewed TOC Interventions Discussed      Interventions Today    Flowsheet Row Most Recent Value  Chronic Disease   Chronic disease during today's visit Other  [Acute on chronic respiratory failure- on home O2]  General Interventions   General Interventions Discussed/Reviewed General Interventions Discussed, Doctor Visits, Durable Medical Equipment (DME), Referral to Nurse, Communication with  [scheduled with RN CM Care Coordinator for follow up telephone visit on 03/24/23]  Doctor Visits Discussed/Reviewed Doctor Visits Discussed, PCP, Specialist  Durable Medical Equipment (DME) Dan Humphreys, Oxygen, Other, Bed side commode  [confirmed currently requiring/ using assistive devices - walker]  PCP/Specialist Visits Compliance with follow-up visit  Communication with RN  Nutrition Interventions   Nutrition Discussed/Reviewed Nutrition Discussed  [reinforced education around tube feeding, need to stay hydrated]  Pharmacy Interventions   Pharmacy Dicussed/Reviewed Pharmacy Topics Discussed  Safety Interventions   Safety Discussed/Reviewed Safety Discussed, Fall Risk      Caryl Pina, RN, BSN, CCRN Alumnus RN CM Care Coordination/ Transition of Care- [facility] Care Management [phone]: direct office

## 2023-03-11 NOTE — Patient Instructions (Addendum)
It was a pleasure to see you today!  I am glad you are finally out of the hospital.   Keep your appointment with me next month. Please call [phone] if issues or concerns arise. You can also send [place] a message through MyChart, but but aware that this is not to be used for urgent issues and it may take up to 5-7 days to receive a reply.   I will send nebulized medications - brovana twice daily - to [facility]. The Yupelri (once daily) I am sending to [facility] which is a Pharmacologist, they should be able to get it approved and sent to your home.  Once you start the nebulized medicines you can stop the symbicort.   Finish the prednisone taper.    Call me in the next week if any issues getting these meds.   Keep oxygen saturations over 88%.   Call me sooner if you need me.

## 2023-03-11 NOTE — Progress Notes (Signed)
Earl Thomas    [id number]    05/12/1951  Primary Care Physician:Brown, Florian Buff, MD Date of Appointment: 03/11/2023 Established Patient Visit  Chief complaint:   Chief Complaint  Patient presents with   Follow-up    Pt states he is feeling pretty good today. Recently been in the ED 5 times since his last visit.     HPI: Earl Thomas is a 72 y.o. gentleman with history of RA-ILD. Intolerant of Ofev due to diarrhea. Had progression on  esbriet as well as weight loss and diarrhea. Intolerant of methotrexate. Currently on rituxumab. Has seen [facility] and is not currently transplant candidate due to weight. Has had a G tube placed to help with nutritional supplementation.  Interval Updates: Here for follow up after prolonged hospitalization. Was in the ICU at [facility] with covid, ILD flare, respiratory failure. Was discharged to [facility]. Then was readmitted to [facility]. Treated with extensive antibiotics, high dose steroids, at some point was up to 10LNC. Now down to [facility] continuous no longer on the POC.   Had C. Diff in the hospital. Had gained weight up to 108. Lost weight after prolonged hospitalizations. He is doing the ensures through the G tube and is trying to put on weight. Yesterday went to the ED due to shortness of breath. Treated for thrush.   Was getting some nebulizer treatments in the LTAC that he perceived to be helpful but they couldn't get approved to medicare.  Currently on a prednisone taper  On 5LNC continuous at rest. Needing to go up 6-7 with exertion.   I have reviewed the patient's family social and past medical history and updated as appropriate.   Past Medical History:  Diagnosis Date   Abnormal weight loss 12/04/2021   Allergic rhinitis 10/11/2022   Aortic atherosclerosis (HCC) 12/04/2021   Arthralgia of temporomandibular joint 12/04/2021   Arthritis    BMI 26.0-26.9,adult  10/22/2016   Chronic bronchitis (HCC) 06/17/2022   Chronic diastolic CHF (congestive heart failure) (HCC) 01/19/2022   Chronic fatigue syndrome 12/04/2021   Chronic hypoxic respiratory failure (HCC) 08/30/2022   Dyslipidemia 03/10/2012   Family history of prostate cancer 12/04/2021   GERD (gastroesophageal reflux disease) 01/19/2022   Glucose intolerance (impaired glucose tolerance)    Hemorrhage of rectum and anus 10/17/2022   Hyperlipidemia    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   Interstitial lung disease (HCC) 07/16/2019   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 (HCC) 10/17/2022   Long-term current use of high risk medication other than anticoagulant 07/16/2019   Other long term (current) drug therapy 12/04/2021   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   Screening for malignant neoplasm of colon 10/17/2022   Skin lesion of chest wall 12/04/2021    Past Surgical History:  Procedure Laterality Date   IR GASTROSTOMY TUBE MOD SED  10/03/2022   ROTATOR CUFF REPAIR Right 01/02/16    Family History  Problem Relation Age of Onset   Cancer Father 18       prostate cancer   Diabetes Father    Heart disease Father 71       AMI late  33s   Cancer Mother 23       Breast cancer   Heart disease Mother        CABG at age 50   Diabetes Brother    Heart disease Brother        AMI x 2; CABG   Hyperlipidemia Brother     Social History   Occupational History   Occupation: Professor  Tobacco Use   Smoking status: Former    Types: Pipe    Quit date: 10/06/1988    Years since quitting: 34.4    Passive exposure:  Never   Smokeless tobacco: Never   Tobacco comments:    parent smoked in home as a child.  Smoked a pip in college, not daily.  Vaping Use   Vaping status: Never Used  Substance and Sexual Activity   Alcohol use: No   Drug use: No   Sexual activity: Yes    Physical Exam: Blood pressure 108/64, pulse (!) 102, height 5\' 5"  (1.651 m), weight 94 lb 12.8 oz (43 kg), SpO2 99%.  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   Echocardiogram 1. Left ventricular ejection fraction, by estimation, is 60 to 65%. The  left ventricle has normal function. The left ventricle has no regional  wall motion abnormalities. Left ventricular diastolic parameters are  consistent with Grade I diastolic  dysfunction (impaired relaxation).   2. Right ventricular systolic function is normal. The right ventricular  size is normal. There is normal pulmonary artery systolic pressure.   3. The mitral valve is normal in structure. Mild mitral valve  regurgitation. No evidence of mitral stenosis.   4. The aortic valve is normal in structure. Aortic valve regurgitation is  not visualized. No aortic stenosis is present.   5. The inferior vena cava is normal in size with greater than 50%  respiratory variability, suggesting right atrial pressure of 3 mmHg.   PFTs:      Latest Ref Rng & Units 10/29/2021    3:00 PM 04/18/2021    3:05 PM 11/13/2020    3:07 PM 08/07/2020   10:04 AM 07/26/2019   10:56 AM  PFT Results  FVC-Pre L 1.22  1.71  1.71  1.37  1.52   FVC-Predicted Pre % 38  54  54  43  47   FVC-Post L     1.82   FVC-Predicted Post %     57   Pre FEV1/FVC % % 99  94  92  100  100   Post FEV1/FCV % %     88   FEV1-Pre L 1.21  1.61  1.57  1.37  1.52   FEV1-Predicted Pre % 50  67  65  57  63   FEV1-Post L     1.61    DLCO uncorrected ml/min/mmHg 8.80  10.08  11.16  9.68  11.79   DLCO UNC% % 39  45  49  42  52   DLCO corrected ml/min/mmHg 8.80   11.16  9.68    DLCO COR %Predicted % 39   49  42    DLVA Predicted % 55  71  104  73  82   TLC L     3.42   TLC %  Predicted %     57   RV % Predicted %     84    I have personally reviewed the patient's PFTs and which show moderate restriction with an FVC of 54% which is stable from previous PFTs.  PFTs at [facility] Jan 2024 show FVC 1.08, dlco severely reduced  Jan 2024 Walked 972 ft on 2L ( ) which is decreased from 1041 ft in August.    Labs: I have reviewed his outside hospital labs noted on March 23 2019 progress note, his CCP level was greater than 255, Rheumatoid factor was 149 his, ESR was 109 his uric acid was normal.  Lab Results  Component Value Date   NA 133 (L) 03/10/2023   K 4.9 03/10/2023   CO2 32 03/10/2023   GLUCOSE 122 (H) 03/10/2023   BUN 19 03/10/2023   CREATININE 0.88 03/10/2023   CALCIUM 8.3 (L) 03/10/2023   GFRNONAA >60 03/10/2023   Lab Results  Component Value Date   WBC 11.5 (H) 03/10/2023   HGB 16.5 03/10/2023   HCT 51.5 03/10/2023   MCV 85.1 03/10/2023   PLT 217 03/10/2023     Immunization status: Immunization History  Administered Date(s) Administered   Fluad Quad(high Dose 65+) 04/13/2020, 04/18/2021, 03/11/2022   Hep A / Hep B 02/12/2022   Influenza, High Dose Seasonal PF 04/24/2019   Influenza,inj,Quad PF,6+ Mos 05/30/2015, 04/23/2016, 05/14/2017, 03/25/2018   Influenza-Unspecified 04/07/2014   PFIZER(Purple Top)SARS-COV-2 Vaccination 07/30/2019, 08/20/2019, 04/27/2020   PNEUMOCOCCAL CONJUGATE-20 12/25/2020   Pfizer Covid-19 Vaccine Bivalent Booster 68yrs & up 04/24/2021   Pneumococcal Conjugate-13 05/18/2018   Pneumococcal Polysaccharide-23 06/09/2019   Td 03/10/2021   Tdap 02/13/2009, 05/14/2017   Zoster Recombinant(Shingrix) 05/27/2018, 01/19/2019   Zoster, Live 04/12/2014    Assessment:   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 Methotrexate stopped due to adverse effects (gastrointestinal) Continue rituximab and meloxicam for RA.  Continue tube feeds.  Will get yupelri and brovana since he has perceived some benefit. Ordered today.   We reviewed the progression of his disease as well his clinical course over the last several months. Unfortunately he has a terrible disease and very aggressive RA. I suspect he has less than a year to live. I think lung transplantation with his recent deconditioning, weight loss, and increasing age and oxygen requirement is very unlikely unfortunately.   Regarding goals of care - did affirm that in the event of respiratory decline he would want to be DNI.   I spent 45 minutes in the care of this patient today including pre-charting, chart review, review of results, face-to-face care, goals of care conversation, coordination of care and communication with consultants etc.).   Return to Care: Keep follow up in October as scheduled.    Durel Salts, MD Pulmonary and Critical Care Medicine [facility] Office:[phone]

## 2023-03-13 ENCOUNTER — Telehealth: Payer: Self-pay | Admitting: Internal Medicine

## 2023-03-13 NOTE — Telephone Encounter (Signed)
[facility] was sent OV notes via fax email

## 2023-03-14 ENCOUNTER — Telehealth: Payer: Self-pay

## 2023-03-14 ENCOUNTER — Other Ambulatory Visit (HOSPITAL_COMMUNITY): Payer: Self-pay

## 2023-03-14 ENCOUNTER — Telehealth: Payer: Self-pay | Admitting: Internal Medicine

## 2023-03-14 DIAGNOSIS — U071 COVID-19: Secondary | ICD-10-CM | POA: Diagnosis not present

## 2023-03-14 DIAGNOSIS — I110 Hypertensive heart disease with heart failure: Secondary | ICD-10-CM | POA: Diagnosis not present

## 2023-03-14 DIAGNOSIS — I5040 Unspecified combined systolic (congestive) and diastolic (congestive) heart failure: Secondary | ICD-10-CM | POA: Diagnosis not present

## 2023-03-14 DIAGNOSIS — M459 Ankylosing spondylitis of unspecified sites in spine: Secondary | ICD-10-CM | POA: Diagnosis not present

## 2023-03-14 DIAGNOSIS — J1282 Pneumonia due to coronavirus disease 2019: Secondary | ICD-10-CM | POA: Diagnosis not present

## 2023-03-14 DIAGNOSIS — J84112 Idiopathic pulmonary fibrosis: Secondary | ICD-10-CM | POA: Diagnosis not present

## 2023-03-14 NOTE — Telephone Encounter (Signed)
[facility] is calling for Office Visit notes that have a billable diagnosis for patient. Fax [phone]

## 2023-03-14 NOTE — Telephone Encounter (Signed)
*  Pulm  Pharmacy Patient Advocate Encounter  Received notification from [facility] that Prior Authorization for Arformoterol Tartrate 15MCG/2ML nebulizer solution  has been CANCELLED due to Medication covered under Medicare Part B   PA #/Case ID/Reference #: [id number]

## 2023-03-17 ENCOUNTER — Ambulatory Visit (INDEPENDENT_AMBULATORY_CARE_PROVIDER_SITE_OTHER): Payer: Medicare Other | Admitting: Emergency Medicine

## 2023-03-17 ENCOUNTER — Encounter: Payer: Self-pay | Admitting: Emergency Medicine

## 2023-03-17 VITALS — BP 114/82 | HR 106 | Temp 97.8°F | Wt 93.6 lb

## 2023-03-17 DIAGNOSIS — M0510 Rheumatoid lung disease with rheumatoid arthritis of unspecified site: Secondary | ICD-10-CM | POA: Diagnosis not present

## 2023-03-17 DIAGNOSIS — R399 Unspecified symptoms and signs involving the genitourinary system: Secondary | ICD-10-CM | POA: Diagnosis not present

## 2023-03-17 DIAGNOSIS — J849 Interstitial pulmonary disease, unspecified: Secondary | ICD-10-CM

## 2023-03-17 DIAGNOSIS — I110 Hypertensive heart disease with heart failure: Secondary | ICD-10-CM | POA: Diagnosis not present

## 2023-03-17 DIAGNOSIS — E43 Unspecified severe protein-calorie malnutrition: Secondary | ICD-10-CM

## 2023-03-17 DIAGNOSIS — M459 Ankylosing spondylitis of unspecified sites in spine: Secondary | ICD-10-CM | POA: Diagnosis not present

## 2023-03-17 DIAGNOSIS — J9611 Chronic respiratory failure with hypoxia: Secondary | ICD-10-CM

## 2023-03-17 DIAGNOSIS — Z09 Encounter for follow-up examination after completed treatment for conditions other than malignant neoplasm: Secondary | ICD-10-CM

## 2023-03-17 DIAGNOSIS — J84112 Idiopathic pulmonary fibrosis: Secondary | ICD-10-CM | POA: Diagnosis not present

## 2023-03-17 DIAGNOSIS — Z23 Encounter for immunization: Secondary | ICD-10-CM

## 2023-03-17 DIAGNOSIS — I5040 Unspecified combined systolic (congestive) and diastolic (congestive) heart failure: Secondary | ICD-10-CM | POA: Diagnosis not present

## 2023-03-17 DIAGNOSIS — U071 COVID-19: Secondary | ICD-10-CM | POA: Diagnosis not present

## 2023-03-17 DIAGNOSIS — J1282 Pneumonia due to coronavirus disease 2019: Secondary | ICD-10-CM | POA: Diagnosis not present

## 2023-03-17 NOTE — Assessment & Plan Note (Signed)
Continues Ensure regimen and testosterone supplementation

## 2023-03-17 NOTE — Progress Notes (Signed)
Earl Thomas 72 y.o.   Chief Complaint  Patient presents with   Follow-up    HISTORY OF PRESENT ILLNESS: This is a 72 y.o. male here for hospital discharge follow-up Accompanied by wife.  Today feeling good.  Has no complaints or any other medical concerns. Wt Readings from Last 3 Encounters:  03/17/23 93 lb 9.6 oz (42.5 kg)  03/11/23 94 lb 12.8 oz (43 kg)  03/09/23 108 lb 3.9 oz (49.1 kg)  Discharge summary as follows:  Physician Discharge Summary    Patient ID: Earl Thomas MRN: [id number] DOB/AGE: April 20, 1951 72 y.o.   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  Was recently released from rehab just on August 30.  His concentrator which was arranged for him was not functioning properly resulting in worsening shortness of breath.  He came into the emergency department.  Noted to be more short of breath than usual with ambulation requiring 10 L of oxygen which is not usual for him. He was brought into the hospital.  Started on IV steroids as there was concern that this could be ILD exacerbation.  Chest x-ray did not show any acute findings.   He recently recovered from COVID-19 about a month ago.  That is likely contributing to some extent. TOC has been consulted to address home oxygen issue. Continue steroids for now.  Anticipate transition to oral steroids from tomorrow.  He was already on a steroid taper prior to admission which can be resumed at discharge. He was also on antibiotics when he came into the  hospital.  Augmentin is being continued.   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  Follows with pulmonology on a regular basis.  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 Methotrexate stopped due to adverse effects (gastrointestinal) Continue rituximab and meloxicam for RA.  Continue tube feeds.   Will get yupelri and brovana since he has perceived some benefit. Ordered today.    We reviewed the progression of his disease as well his clinical course over the last several months. Unfortunately he has a terrible disease and very aggressive RA. I suspect he has less than a year to live. I think lung transplantation with his recent deconditioning, weight loss, and increasing age and oxygen requirement is very unlikely unfortunately.    Regarding goals of care - did affirm that in the event of respiratory decline he would want to be DNI.    I spent 45 minutes in the care of this  patient today including pre-charting, chart review, review of results, face-to-face care, goals of care conversation, coordination of care and communication with consultants etc.).     Return to Care: Keep follow up in October as scheduled.      Durel Salts, MD Pulmonary and Critical Care Medicine [facility] Office:[phone]  HPI   Prior to Admission medications   Medication Sig Start Date End Date Taking?  Authorizing Provider  albuterol (VENTOLIN HFA) 108 (90 Base) MCG/ACT inhaler Inhale 1-2 puffs into the lungs every 6 (six) hours as needed for wheezing or shortness of breath. 11/07/22  Yes Charlott Holler, MD  arformoterol (BROVANA) 15 MCG/2ML NEBU Take 2 mLs (15 mcg total) by nebulization in the morning and at bedtime. DX J96.11 J84.9 M06.9 03/12/23  Yes Charlott Holler, MD  ascorbic acid (VITAMIN C) 500 MG tablet Take 500 mg by mouth daily.   Yes [provider]  aspirin EC 81 MG tablet Take 81 mg by mouth every morning.   Yes [provider]  azelastine (ASTELIN) 0.1 % nasal spray Place 2 sprays into both nostrils 2 (two) times daily. Use in each nostril as directed Patient taking differently: Place 2 sprays into both nostrils 2 (two) times daily as needed for rhinitis or allergies (congestion). Use in each nostril as directed 12/25/21  Yes Ferol Luz, MD  Brimonidine Tartrate (LUMIFY) 0.025 % SOLN Place 1 drop into both eyes daily as needed (dry eyes).   Yes [provider]  carvedilol (COREG) 3.125 MG tablet Take 3.125 mg by mouth 2 (two) times daily with a meal. 03/06/23  Yes [provider]  cholestyramine (QUESTRAN) 4 g packet Take 4 g by mouth 2 (two) times daily. 03/06/23  Yes [provider]  dextromethorphan (DELSYM) 30 MG/5ML liquid Take 5 mLs (30 mg total) by mouth at bedtime as needed for cough. 02/07/23  Yes Marrianne Mood, MD  docusate sodium (COLACE) 100 MG capsule Take 1 capsule (100 mg total) by mouth 2 (two) times daily as needed for mild constipation. 02/07/23  Yes Marrianne Mood, MD  famotidine (PEPCID) 20 MG tablet Take 1 tablet (20 mg total) by mouth 2 (two) times daily. 04/30/22  Yes Linzee Depaul, Eilleen Kempf, MD  fluticasone [facility]) 50 MCG/ACT nasal spray Place 1 spray into both nostrils in the morning and at bedtime. 02/25/23  Yes [provider]  guaiFENesin (ROBITUSSIN) 100 MG/5ML liquid Take 5 mLs by mouth every 4  (four) hours as needed for cough or to loosen phlegm. 02/07/23  Yes Marrianne Mood, MD  levocetirizine (XYZAL) 5 MG tablet TAKE 1 TABLET BY MOUTH ONCE DAILY IN THE EVENING 07/09/22  Yes Ferol Luz, MD  megestrol (MEGACE) 40 MG/ML suspension Take 400 mg by mouth daily. 03/07/23  Yes [provider]  Multiple Vitamin (MULTIVITAMIN WITH MINERALS) TABS tablet Place 1 tablet into feeding tube daily. 02/08/23  Yes Marrianne Mood, MD  nitroGLYCERIN (NITROSTAT) 0.4 MG SL tablet Place 0.4 mg under the tongue every 5 (five) minutes as needed for chest pain.   Yes [provider]  Nutritional Supplements (FEEDING SUPPLEMENT, OSMOLITE 1.5 CAL,) LIQD Place 1,000 mLs into feeding tube continuous. 02/07/23  Yes Marrianne Mood, MD  nystatin (MYCOSTATIN) 100000 UNIT/ML suspension Take 5 mLs (500,000 Units total) by mouth 4 (four) times daily for 7 days. 03/10/23 03/17/23 Yes Osvaldo Shipper, MD  polyethylene glycol (MIRALAX / GLYCOLAX) 17 g packet Take 17 g by mouth daily as needed for moderate constipation. 02/07/23  Yes Marrianne Mood,  MD  predniSONE (DELTASONE) 10 MG tablet Take 10-40 mg by mouth See admin instructions. Take 4 tablets (40 mg total) by mouth daily for 4 days, THEN 3 tablets (30 mg total) daily for 5 days, THEN 2 tablets (20 mg total) daily for 5 days, THEN 1 tablet (10 mg total) daily for 5 days. 03/06/23 03/24/23 Yes [provider]  Protein (FEEDING SUPPLEMENT, PROSOURCE TF20,) liquid Place 60 mLs into feeding tube daily. 02/08/23  Yes Marrianne Mood, MD  revefenacin (YUPELRI) 175 MCG/3ML nebulizer solution Take 3 mLs (175 mcg total) by nebulization daily. 03/11/23  Yes Charlott Holler, MD  riTUXimab (RITUXAN IV) Inject into the vein.   Yes [provider]  Testosterone Enanthate (XYOSTED) 75 MG/0.5ML SOAJ Inject 75 mg into the skin once a week. 10/21/22  Yes Kahlen Boyde, Eilleen Kempf, MD  Vibegron (GEMTESA) 75 MG TABS Take 1 tablet (75 mg total) by mouth daily at 6  (six) AM. 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   Chronic bronchitis (HCC) 06/17/2022   Protein-calorie malnutrition, severe (HCC) 06/17/2022   Acute respiratory failure with hypoxia (HCC) 01/19/2022   COVID-19 01/19/2022   GERD (gastroesophageal reflux disease) 01/19/2022   Chronic diastolic CHF (congestive heart failure) (HCC) 01/19/2022   Arthritis 12/25/2021   Hyperlipidemia 12/25/2021   Chronic fatigue syndrome 12/04/2021   Other long term (current) drug therapy 12/04/2021   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   Arthralgia of temporomandibular joint 12/04/2021   Abnormal weight loss 12/04/2021   Family history of prostate cancer 12/04/2021   Aortic atherosclerosis (HCC) 12/04/2021   Skin lesion of chest wall 12/04/2021   Interstitial lung disease (HCC) 07/16/2019   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) 07/16/2019   Long-term current use of high risk medication other than anticoagulant 07/16/2019   BMI 26.0-26.9,adult 10/22/2016   Renal insufficiency 04/29/2016   Glucose intolerance (impaired glucose tolerance) 04/29/2016   Dyslipidemia 03/10/2012   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    Past Medical History:  Diagnosis Date   Abnormal weight loss 12/04/2021   Allergic rhinitis 10/11/2022   Aortic atherosclerosis (HCC) 12/04/2021   Arthralgia of temporomandibular joint 12/04/2021    Arthritis    BMI 26.0-26.9,adult 10/22/2016   Chronic bronchitis (HCC) 06/17/2022   Chronic diastolic CHF (congestive heart failure) (HCC) 01/19/2022   Chronic fatigue syndrome 12/04/2021   Chronic hypoxic respiratory failure (HCC) 08/30/2022   Dyslipidemia 03/10/2012   Family history of prostate cancer 12/04/2021   GERD (gastroesophageal reflux disease) 01/19/2022   Glucose intolerance (impaired glucose tolerance)    Hemorrhage of rectum and anus 10/17/2022   Hyperlipidemia    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   Screening for malignant neoplasm of colon 10/17/2022   Skin lesion of chest wall 12/04/2021    Past Surgical History:  Procedure Laterality Date   IR GASTROSTOMY TUBE MOD SED  10/03/2022   ROTATOR CUFF REPAIR Right 01/02/16    Social History   Socioeconomic History   Marital status: Married    Spouse name: Not on file   Number of children: 2   Years of education: Not on file   Highest education level: Not on file  Occupational History   Occupation: Professor  Tobacco Use   Smoking status: Former    Types: Pipe    Quit date: 10/06/1988    Years since quitting: 34.4    Passive exposure: Never   Smokeless tobacco: Never   Tobacco comments:    parent smoked in home as a child.  Smoked a pip in college, not daily.   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      Tobacco: pipe in 1980s      Alcohol: none      Exercise: joined [employer]; going 2-3 times per week.        Seatbelt:  100%   Social Determinants of Health   Financial Resource Strain: Low Risk  (12/09/2022)   Overall Financial Resource Strain (CARDIA)    Difficulty of Paying Living Expenses: Not hard at all  Food Insecurity: No Food Insecurity (03/11/2023)   Hunger Vital Sign    Worried About Running Out of Food in the Last Year: Never true    Ran Out of Food in the Last Year: Never true  Transportation Needs: No Transportation Needs (03/11/2023)   PRAPARE - Administrator, Civil Service (Medical): No    Lack of Transportation (Non-Medical): No  Physical Activity: Inactive (12/09/2022)   Exercise Vital Sign    Days of Exercise per Week: 0 days    Minutes of Exercise per Session: 0 min  Stress: No Stress Concern Present (02/25/2023)   Received from [facility]   [employer] of Occupational Health - Occupational Stress Questionnaire    Feeling of Stress : Not at all  Social Connections: Socially Integrated (12/09/2022)   Social Connection and Isolation Panel [NHANES]    Frequency of Communication with Friends and Family: More than three times a week    Frequency of Social Gatherings with Friends and Family: More than three times a week    Attends Religious Services: More than 4 times per year    Active Member of [employer] or Organizations: Yes    Attends Banker Meetings: More  than 4 times per year    Marital Status: Married  Catering manager Violence: Not At Risk (03/09/2023)   Humiliation, Afraid,  Rape, and Kick questionnaire    Fear of Current or Ex-Partner: No    Emotionally Abused: No    Physically Abused: No    Sexually Abused: No  Recent Concern: Intimate Partner Violence - At Risk (02/12/2023)   Received from [facility]   Domestic Abuse Assessment    Do you feel safe in your relationships at home?: Yes    Physical Abuse: Denies    HRSN Domestic Abuse - Type of Abuse: Not on file    HRSN Domestic Abuse - Time Frame: Not on file    HRSN Domestic Abuse - Signs and Symptoms: Not on file    Verbal Abuse: Denies    Possible abuse reported to:: [facility]ocate    Family History  Problem Relation Age of Onset   Cancer Father 36       prostate cancer   Diabetes Father    Heart disease Father 27       AMI late 40s   Cancer Mother 47       Breast cancer   Heart disease Mother        CABG at age 21   Diabetes Brother    Heart disease Brother        AMI x 2; CABG   Hyperlipidemia Brother      Review of Systems  Constitutional: Negative.  Negative for chills and fever.  HENT:  Negative for congestion and sore throat.   Respiratory: Negative.  Negative for cough and shortness of breath.   Cardiovascular: Negative.  Negative for chest pain and palpitations.  Gastrointestinal:  Negative for abdominal pain, diarrhea, nausea and vomiting.  Genitourinary: Negative.  Negative for dysuria and hematuria.  Skin: Negative.  Negative for rash.  Neurological:  Negative for dizziness and headaches.  All other systems reviewed and are negative.   Vitals:   03/17/23 1418  BP: 114/82  Pulse: (!) 106  Temp: 97.8 F (36.6 C)  SpO2: 96%    Physical Exam Vitals reviewed.  Constitutional:      Comments: Thin and frail Chronically ill looking On portable oxygen  HENT:     Head: Normocephalic.  Eyes:     Extraocular Movements: Extraocular movements intact.   Cardiovascular:     Rate and Rhythm: Regular rhythm. Tachycardia present.     Pulses: Normal pulses.     Heart sounds: Normal heart sounds.     Comments: Slightly tachycardic Pulmonary:     Effort: Pulmonary effort is normal.     Breath sounds: Normal breath sounds.  Musculoskeletal:     Cervical back: No tenderness.  Lymphadenopathy:     Cervical: No cervical adenopathy.  Skin:    General: Skin is warm and dry.  Neurological:     Mental Status: He is alert and oriented to person, place, and time.  Psychiatric:        Mood and Affect: Mood normal.        Behavior: Behavior normal.      ASSESSMENT & PLAN: A total of 44 minutes was spent with the patient and counseling/coordination of care regarding preparing for this visit, review of most recent office visit notes, review of most recent hospital discharge summary notes, review of most recent pulmonary doctor office visit notes, review of multiple chronic medical conditions under management, review of all medications, review of most recent blood work results, education on nutrition, prognosis, documentation, and need for follow-up.  Problem List Items Addressed This Visit  Respiratory   Interstitial lung disease (HCC)    Chronic progressive disease Oxygen dependent Not responsive to treatment. 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) - Primary    Chronic progressive disease Oxygen dependent Clinically stable Not considered to be a good transplant candidate due to frail condition      Chronic hypoxic respiratory failure (HCC)    On portable oxygen 4 L/min        Other   Protein-calorie malnutrition, severe (HCC)    Continues Ensure regimen and testosterone supplementation      Lower urinary tract symptoms    Presently on Gemtesa 75 mg daily      Other Visit Diagnoses     Need for vaccination       Relevant Orders   Flu Vaccine Trivalent High Dose (Fluad) (Completed)    Hospital discharge follow-up              Patient Instructions  Health Maintenance After Age 54 After age 72, you are at a higher risk for certain long-term diseases and infections as well as injuries from falls. Falls are a major cause of broken bones and head injuries in people who are older than age 20. Getting regular preventive care can help to keep you healthy and well. Preventive care includes getting regular testing and making lifestyle changes as recommended by your health care provider. Talk with your health care provider about: Which screenings and tests you should have. A screening is a test that checks for a disease when you have no symptoms. A diet and exercise plan that is right for you. What should I know about screenings and tests to prevent falls? Screening and testing are the best ways to find a health problem early. Early diagnosis and treatment give you the best chance of managing medical conditions that are common after age 64. Certain conditions and lifestyle choices may make you more likely to have a fall. Your health care provider may recommend: Regular vision checks. Poor vision and conditions such as cataracts can make you more likely to have a fall. If you wear glasses, make sure to get your prescription updated if your vision changes. Medicine review. Work with your health care provider to regularly review all of the medicines you are taking, including over-the-counter medicines. Ask your health care provider about any side effects that may make you more likely to have a fall. Tell your health care provider if any medicines that you take make you feel dizzy or sleepy. Strength and balance checks. Your health care provider may recommend certain tests to check your strength and balance while standing, walking, or changing positions. Foot health exam. Foot pain and numbness, as well as not wearing proper footwear, can make you more likely to have a fall. Screenings,  including: Osteoporosis screening. Osteoporosis is a condition that causes the bones to get weaker and break more easily. Blood pressure screening. Blood pressure changes and medicines to control blood pressure can make you feel dizzy. Depression screening. You may be more likely to have a fall if you have a fear of falling, feel depressed, or feel unable to do activities that you used to do. Alcohol use screening. Using too much alcohol can affect your balance and may make you more likely to have a fall. Follow these instructions at home: Lifestyle Do not drink alcohol if: Your health care provider tells you not to drink. If you drink alcohol: Limit how much  you have to: 0-1 drink a day for women. 0-2 drinks a day for men. Know how much alcohol is in your drink. In the [place] one drink equals one 12 oz bottle of beer (355 mL), one 5 oz glass of wine (148 mL), or one 1 oz glass of hard liquor (44 mL). Do not use any products that contain nicotine or tobacco. These products include cigarettes, chewing tobacco, and vaping devices, such as e-cigarettes. If you need help quitting, ask your health care provider. Activity  Follow a regular exercise program to stay fit. This will help you maintain your balance. Ask your health care provider what types of exercise are appropriate for you. If you need a cane or walker, use it as recommended by your health care provider. Wear supportive shoes that have nonskid soles. Safety  Remove any tripping hazards, such as rugs, cords, and clutter. Install safety equipment such as grab bars in bathrooms and safety rails on stairs. Keep rooms and walkways well-lit. General instructions Talk with your health care provider about your risks for falling. Tell your health care provider if: You fall. Be sure to tell your health care provider about all falls, even ones that seem minor. You feel dizzy, tiredness (fatigue), or off-balance. Take over-the-counter and  prescription medicines only as told by your health care provider. These include supplements. Eat a healthy diet and maintain a healthy weight. A healthy diet includes low-fat dairy products, low-fat (lean) meats, and fiber from whole grains, beans, and lots of fruits and vegetables. Stay current with your vaccines. Schedule regular health, dental, and eye exams. Summary Having a healthy lifestyle and getting preventive care can help to protect your health and wellness after age 57. Screening and testing are the best way to find a health problem early and help you avoid having a fall. Early diagnosis and treatment give you the best chance for managing medical conditions that are more common for people who are older than age 58. Falls are a major cause of broken bones and head injuries in people who are older than age 14. Take precautions to prevent a fall at home. Work with your health care provider to learn what changes you can make to improve your health and wellness and to prevent falls. This information is not intended to replace advice given to you by your health care provider. Make sure you discuss any questions you have with your health care provider. Document Revised: 11/13/2020 Document Reviewed: 11/13/2020 Elsevier Patient Education  2024 Elsevier Inc.     Edwina Barth, MD [facility]

## 2023-03-17 NOTE — Assessment & Plan Note (Signed)
Chronic progressive disease Oxygen dependent Clinically stable Not considered to be a good transplant candidate due to frail condition

## 2023-03-17 NOTE — Assessment & Plan Note (Signed)
Presently on Gemtesa 75 mg daily

## 2023-03-17 NOTE — Assessment & Plan Note (Signed)
Chronic progressive disease Oxygen dependent Not responsive to treatment. Poor prognosis

## 2023-03-17 NOTE — Patient Instructions (Signed)
Health Maintenance After Age 70 After age 70, you are at a higher risk for certain long-term diseases and infections as well as injuries from falls. Falls are a major cause of broken bones and head injuries in people who are older than age 70. Getting regular preventive care can help to keep you healthy and well. Preventive care includes getting regular testing and making lifestyle changes as recommended by your health care provider. Talk with your health care provider about: Which screenings and tests you should have. A screening is a test that checks for a disease when you have no symptoms. A diet and exercise plan that is right for you. What should I know about screenings and tests to prevent falls? Screening and testing are the best ways to find a health problem early. Early diagnosis and treatment give you the best chance of managing medical conditions that are common after age 70. Certain conditions and lifestyle choices may make you more likely to have a fall. Your health care provider may recommend: Regular vision checks. Poor vision and conditions such as cataracts can make you more likely to have a fall. If you wear glasses, make sure to get your prescription updated if your vision changes. Medicine review. Work with your health care provider to regularly review all of the medicines you are taking, including over-the-counter medicines. Ask your health care provider about any side effects that may make you more likely to have a fall. Tell your health care provider if any medicines that you take make you feel dizzy or sleepy. Strength and balance checks. Your health care provider may recommend certain tests to check your strength and balance while standing, walking, or changing positions. Foot health exam. Foot pain and numbness, as well as not wearing proper footwear, can make you more likely to have a fall. Screenings, including: Osteoporosis screening. Osteoporosis is a condition that causes  the bones to get weaker and break more easily. Blood pressure screening. Blood pressure changes and medicines to control blood pressure can make you feel dizzy. Depression screening. You may be more likely to have a fall if you have a fear of falling, feel depressed, or feel unable to do activities that you used to do. Alcohol use screening. Using too much alcohol can affect your balance and may make you more likely to have a fall. Follow these instructions at home: Lifestyle Do not drink alcohol if: Your health care provider tells you not to drink. If you drink alcohol: Limit how much you have to: 0-1 drink a day for women. 0-2 drinks a day for men. Know how much alcohol is in your drink. In the [place] one drink equals one 12 oz bottle of beer (355 mL), one 5 oz glass of wine (148 mL), or one 1 oz glass of hard liquor (44 mL). Do not use any products that contain nicotine or tobacco. These products include cigarettes, chewing tobacco, and vaping devices, such as e-cigarettes. If you need help quitting, ask your health care provider. Activity  Follow a regular exercise program to stay fit. This will help you maintain your balance. Ask your health care provider what types of exercise are appropriate for you. If you need a cane or walker, use it as recommended by your health care provider. Wear supportive shoes that have nonskid soles. Safety  Remove any tripping hazards, such as rugs, cords, and clutter. Install safety equipment such as grab bars in bathrooms and safety rails on stairs. Keep rooms and walkways   well-lit. General instructions Talk with your health care provider about your risks for falling. Tell your health care provider if: You fall. Be sure to tell your health care provider about all falls, even ones that seem minor. You feel dizzy, tiredness (fatigue), or off-balance. Take over-the-counter and prescription medicines only as told by your health care provider. These include  supplements. Eat a healthy diet and maintain a healthy weight. A healthy diet includes low-fat dairy products, low-fat (lean) meats, and fiber from whole grains, beans, and lots of fruits and vegetables. Stay current with your vaccines. Schedule regular health, dental, and eye exams. Summary Having a healthy lifestyle and getting preventive care can help to protect your health and wellness after age 70. Screening and testing are the best way to find a health problem early and help you avoid having a fall. Early diagnosis and treatment give you the best chance for managing medical conditions that are more common for people who are older than age 70. Falls are a major cause of broken bones and head injuries in people who are older than age 70. Take precautions to prevent a fall at home. Work with your health care provider to learn what changes you can make to improve your health and wellness and to prevent falls. This information is not intended to replace advice given to you by your health care provider. Make sure you discuss any questions you have with your health care provider. Document Revised: 11/13/2020 Document Reviewed: 11/13/2020 Elsevier Patient Education  2024 Elsevier Inc.  

## 2023-03-17 NOTE — Assessment & Plan Note (Signed)
On portable oxygen 4 L/min

## 2023-03-18 NOTE — Telephone Encounter (Signed)
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 Please advise and send over notes to [phone].

## 2023-03-20 ENCOUNTER — Telehealth: Payer: Self-pay | Admitting: Internal Medicine

## 2023-03-20 DIAGNOSIS — I110 Hypertensive heart disease with heart failure: Secondary | ICD-10-CM | POA: Diagnosis not present

## 2023-03-20 DIAGNOSIS — M459 Ankylosing spondylitis of unspecified sites in spine: Secondary | ICD-10-CM | POA: Diagnosis not present

## 2023-03-20 DIAGNOSIS — I5040 Unspecified combined systolic (congestive) and diastolic (congestive) heart failure: Secondary | ICD-10-CM | POA: Diagnosis not present

## 2023-03-20 DIAGNOSIS — J84112 Idiopathic pulmonary fibrosis: Secondary | ICD-10-CM | POA: Diagnosis not present

## 2023-03-20 DIAGNOSIS — U071 COVID-19: Secondary | ICD-10-CM | POA: Diagnosis not present

## 2023-03-20 DIAGNOSIS — J1282 Pneumonia due to coronavirus disease 2019: Secondary | ICD-10-CM | POA: Diagnosis not present

## 2023-03-20 NOTE — Telephone Encounter (Signed)
Okay for verbals as requested

## 2023-03-20 NOTE — Telephone Encounter (Signed)
Caller [employer] What [employer]:  Tresa Endo from [facility]   Phone Number:  [phone]   Needs Verbal orders for what service & frequency:  Home Health PT 1 week x 8

## 2023-03-20 NOTE — Telephone Encounter (Signed)
Called Kelly at [facility] and left message on secure VM to ok verbals.

## 2023-03-24 ENCOUNTER — Ambulatory Visit: Payer: Self-pay

## 2023-03-24 DIAGNOSIS — J84112 Idiopathic pulmonary fibrosis: Secondary | ICD-10-CM | POA: Diagnosis not present

## 2023-03-24 DIAGNOSIS — J1282 Pneumonia due to coronavirus disease 2019: Secondary | ICD-10-CM | POA: Diagnosis not present

## 2023-03-24 DIAGNOSIS — M459 Ankylosing spondylitis of unspecified sites in spine: Secondary | ICD-10-CM | POA: Diagnosis not present

## 2023-03-24 DIAGNOSIS — I110 Hypertensive heart disease with heart failure: Secondary | ICD-10-CM | POA: Diagnosis not present

## 2023-03-24 DIAGNOSIS — I5040 Unspecified combined systolic (congestive) and diastolic (congestive) heart failure: Secondary | ICD-10-CM | POA: Diagnosis not present

## 2023-03-24 DIAGNOSIS — Z0279 Encounter for issue of other medical certificate: Secondary | ICD-10-CM

## 2023-03-24 DIAGNOSIS — U071 COVID-19: Secondary | ICD-10-CM | POA: Diagnosis not present

## 2023-03-24 NOTE — Patient Outreach (Signed)
Care Coordination   Initial Visit Note   03/24/2023 Name: Earl Thomas MRN: [id number] DOB: 1950-07-22  Earl Thomas is a 72 y.o. year old male who sees Sagardia, Eilleen Kempf, MD for primary care. I spoke with  Earl Thomas by phone today.  What matters to the patients health and wellness today?  02/07/23-02/25/23 [facility]; 8/20-8/30 [facility]; 03/08/23-03/10/23 [place] acute respiratory failure with hypoxia. Mr. Bruney reports he is feeling better since discharge from hospital.  He continues to use oxygen as prescribed. He is active with home health PT/OT/Nurse. Upon medication review with spouse, patient has a nebulizer machine, but no nebulizer medication. She also unclear if patient is to be taking [place]. Earl Thomas reports she has been trying to keep up with medications changes between transitioning between facilities, and is receptive to clinical pharmacist to complete medication reconciliation.   Goals Addressed             This Visit's Progress    Health managment       Interventions Today    Flowsheet Row Most Recent Value  Chronic Disease   Chronic disease during today's visit Other  [ILD, Rheumatoid, lung disease, Rheumatoid arthritis, protein calorie malnutrition, post hospitalization-]  General Interventions   General Interventions Discussed/Reviewed General Interventions Discussed, Durable Medical Equipment (DME), Doctor Visits  Doctor Visits Discussed/Reviewed Doctor Visits Discussed, PCP  Durable Medical Equipment (DME) Oxygen, Bed side commode, Walker, Other  [pulse oximeter]  PCP/Specialist Visits Compliance with follow-up visit  [reviewed upcoming/scheduled appointments]  Exercise Interventions   Exercise Discussed/Reviewed Exercise Discussed  [confirmed patient is active with [facility] for PT/OT/nursing]  Education Interventions   Education Provided Provided Education  Provided Verbal Education  On When to see the doctor, Medication, Exercise  [advised to take medications as prescirbed, attend provider visits as scheduled, contact provider with health questions or concerns, work with home health  staff as recommended]  Nutrition Interventions   Nutrition Discussed/Reviewed Nutrition Discussed  [reiterated/confirmed patient has tube feeding/supplements]  Pharmacy Interventions   Pharmacy Dicussed/Reviewed Pharmacy Topics Discussed, Referral to Pharmacist  [referral to pharmacist - patient has been in hospital/rehab/hospital. Needs medications reconcilation- has nebulzier machine, but no nebulizer medication. Also Patient not taking Gemtesa.]  Safety Interventions   Safety Discussed/Reviewed Safety Discussed, Fall Risk  [rieterated importance of performing excercises recommended by therapist in buidling muscle tone and strength.]         SDOH assessments and interventions completed:  Yes  Care Coordination Interventions:  Yes, provided   Follow up plan: Follow up call scheduled for 04/16/23    Encounter Outcome:  Patient Visit Completed   Kathyrn Sheriff, RN, MSN, BSN, CCM Care Management Coordinator [phone]

## 2023-03-24 NOTE — Patient Instructions (Signed)
Visit Information  Thank you for taking time to visit with me today. Please don't hesitate to contact me if I can be of assistance to you.   Following are the goals we discussed today:  Continue to take medications as prescribed. Continue to attend provider visits as scheduled Continue to work with home health staff as recommended Contact your provider with health questions or concerns Plan to received a call from the clinical pharmacist to review medications with you.   Our next appointment is by telephone on 04/16/23 at 1:30 pm  Please call the care guide team at [phone] if you need to cancel or reschedule your appointment.   If you are experiencing a Mental Health or Behavioral Health Crisis or need someone to talk to, please call the Suicide and Crisis Lifeline: 988 call the [place] National Suicide Prevention Lifeline: 906-121-9157 or TTY: (806)776-4253 TTY (364)506-4802) to talk to a trained counselor call 1-800-273-TALK (toll free, 24 hour hotline)  Kathyrn Sheriff, RN, MSN, BSN, CCM Care Management Coordinator [phone]

## 2023-03-25 ENCOUNTER — Telehealth: Payer: Self-pay

## 2023-03-25 ENCOUNTER — Telehealth: Payer: Self-pay | Admitting: Internal Medicine

## 2023-03-25 DIAGNOSIS — J709 Respiratory conditions due to unspecified external agent: Secondary | ICD-10-CM

## 2023-03-25 DIAGNOSIS — Z79899 Other long term (current) drug therapy: Secondary | ICD-10-CM | POA: Diagnosis not present

## 2023-03-25 DIAGNOSIS — R5383 Other fatigue: Secondary | ICD-10-CM | POA: Diagnosis not present

## 2023-03-25 DIAGNOSIS — M0579 Rheumatoid arthritis with rheumatoid factor of multiple sites without organ or systems involvement: Secondary | ICD-10-CM | POA: Diagnosis not present

## 2023-03-25 MED ORDER — REVEFENACIN 175 MCG/3ML IN SOLN: 175.0000 ug | Freq: Every day | RESPIRATORY_TRACT | 3 refills | Status: DC

## 2023-03-25 NOTE — Telephone Encounter (Signed)
See previous encounters from 9/6 , [facility] needs billable diagnosis of J41-J70.9 or a COVID related diagnosis (J12.82, U07.1, or U09.9) within chart notes as an active assessment to approve Yupelri. Please advise on adding notes or an alternative. If updating notes, please fax to [phone] with insurance information.

## 2023-03-25 NOTE — Telephone Encounter (Signed)
Order has been ordered and included pts dx code. Nfn at this time

## 2023-03-25 NOTE — Progress Notes (Signed)
Care Guide Note  03/25/2023 Name: Earl Thomas MRN: [id number] DOB: 1950-08-25  Referred by: Georgina Quint, MD Reason for referral : Care Coordination (Outreach to schedule pharm d )   Earl Thomas is a 72 y.o. year old male who is a primary care patient of Georgina Quint, MD. Earl Thomas was referred to the pharmacist for assistance related to HLD.    Successful contact was made with the patient to discuss pharmacy services including being ready for the pharmacist to call at least 5 minutes before the scheduled appointment time, to have medication bottles and any blood sugar or blood pressure readings ready for review. The patient agreed to meet with the pharmacist via with the pharmacist via telephone visit on (date/time).  03/31/2023  Penne Lash, RMA Care Guide [facility]  [place], [place] [address] Direct Dial: [phone] [email or web address]

## 2023-03-25 NOTE — Telephone Encounter (Signed)
Records faxed to [phone]

## 2023-03-26 DIAGNOSIS — M459 Ankylosing spondylitis of unspecified sites in spine: Secondary | ICD-10-CM | POA: Diagnosis not present

## 2023-03-26 DIAGNOSIS — I110 Hypertensive heart disease with heart failure: Secondary | ICD-10-CM | POA: Diagnosis not present

## 2023-03-26 DIAGNOSIS — U071 COVID-19: Secondary | ICD-10-CM | POA: Diagnosis not present

## 2023-03-26 DIAGNOSIS — I5040 Unspecified combined systolic (congestive) and diastolic (congestive) heart failure: Secondary | ICD-10-CM | POA: Diagnosis not present

## 2023-03-26 DIAGNOSIS — J84112 Idiopathic pulmonary fibrosis: Secondary | ICD-10-CM | POA: Diagnosis not present

## 2023-03-26 DIAGNOSIS — J1282 Pneumonia due to coronavirus disease 2019: Secondary | ICD-10-CM | POA: Diagnosis not present

## 2023-03-27 DIAGNOSIS — U071 COVID-19: Secondary | ICD-10-CM | POA: Diagnosis not present

## 2023-03-27 DIAGNOSIS — I110 Hypertensive heart disease with heart failure: Secondary | ICD-10-CM | POA: Diagnosis not present

## 2023-03-27 DIAGNOSIS — J1282 Pneumonia due to coronavirus disease 2019: Secondary | ICD-10-CM | POA: Diagnosis not present

## 2023-03-27 DIAGNOSIS — I5040 Unspecified combined systolic (congestive) and diastolic (congestive) heart failure: Secondary | ICD-10-CM | POA: Diagnosis not present

## 2023-03-27 DIAGNOSIS — M459 Ankylosing spondylitis of unspecified sites in spine: Secondary | ICD-10-CM | POA: Diagnosis not present

## 2023-03-27 DIAGNOSIS — J84112 Idiopathic pulmonary fibrosis: Secondary | ICD-10-CM | POA: Diagnosis not present

## 2023-03-27 NOTE — Telephone Encounter (Signed)
done

## 2023-03-27 NOTE — Telephone Encounter (Signed)
Good afternoon Dr.Desai, There has to be a code of j41-j70.9 or J12.82, U07.1, or U09.9. for the pt to receive his neb medication for insurance to cover it.  Can you addend your notes to include one of those codes

## 2023-03-31 ENCOUNTER — Other Ambulatory Visit: Payer: Medicare Other

## 2023-03-31 ENCOUNTER — Ambulatory Visit: Payer: Self-pay

## 2023-03-31 DIAGNOSIS — J849 Interstitial pulmonary disease, unspecified: Secondary | ICD-10-CM

## 2023-03-31 DIAGNOSIS — E43 Unspecified severe protein-calorie malnutrition: Secondary | ICD-10-CM

## 2023-03-31 MED ORDER — MEGESTROL ACETATE 40 MG/ML PO SUSP: 400.0000 mg | Freq: Every day | ORAL | 1 refills | Status: DC

## 2023-03-31 MED ORDER — FAMOTIDINE 20 MG PO TABS: 20.0000 mg | ORAL_TABLET | Freq: Two times a day (BID) | ORAL | 0 refills | Status: DC

## 2023-03-31 MED ORDER — CARVEDILOL 3.125 MG PO TABS: 3.1250 mg | ORAL_TABLET | Freq: Two times a day (BID) | ORAL | 0 refills | Status: DC

## 2023-03-31 NOTE — Progress Notes (Unsigned)
03/31/2023 Name: Earl Thomas MRN: [id number] DOB: 22-May-1951  Chief Complaint  Patient presents with   Medication Management    Couper Joyal is a 72 y.o. year old male who presented for a telephone visit.   They were referred to the pharmacist by their Case Management Team  for assistance with  complex medication reconciliation .    Subjective:  Care Team: Primary Care Provider: Georgina Quint, MD ; Next Scheduled Visit: 04/28/2023 Pulmonologist: Dr. Celine Mans; Next scheduled visit: 04/11/2023  Medication Access/Adherence  Current Pharmacy:  [facility] [address], [place] - [address] [place] [address] Phone: [phone] Fax: [phone]  [facility] - [place], [place] - [address] [facility] AT Portal to Registered [facility] Sites [address] [place] [address] Phone: [phone] Fax: [phone]    Patient reports affordability concerns with their medications: No  Patient reports access/transportation concerns to their pharmacy: No  Patient reports adherence concerns with their medications:  Yes  Pt and wife wanted clarification as to what he is supposed to be taking since discharge from hospital.   Current concerns: - Pt needs refills on several meds s/p hospitalization: Mucinex, megestrol, carvedilol, famotidine - Pt has had an increase in BM, used to have 2-3 per day, now 4-6x per day. Not diarrhea but occasionally loose. Confirmed not taking any laxatives. He is taking cholestyramine 4g BID.  - Pt's wife reports his coughing and drainage has increased. Not currently taking mucinex, delsym, flonase, or Xyzal that are on med list. - They have not received nebulizer meds sent by pulm, he is still taking Anoro inhaler currently - Pt's wife is giving him Osmolite TID but not Prosource (on med list). She notes no one is currently following them for feeding  supplementation monitoring/adjustments. - Pt has been taking nystatin TID for thrush. Wife states thrush on tongue has not improved.    Objective:  Lab Results  Component Value Date   HGBA1C 6.1 (H) 02/07/2023    Lab Results  Component Value Date   CREATININE 0.88 03/10/2023   BUN 19 03/10/2023   NA 133 (L) 03/10/2023   K 4.9 03/10/2023   CL 92 (L) 03/10/2023   CO2 32 03/10/2023    Lab Results  Component Value Date   CHOL 119 12/04/2021   HDL 34.40 (L) 12/04/2021   LDLCALC 72 12/04/2021   TRIG 64.0 12/04/2021   CHOLHDL 3 12/04/2021    Medications Reviewed Today     Reviewed by Bonita Quin, RPH (Pharmacist) on 03/31/23 at 1134  Med List Status: <None>   Medication Order Taking? Sig Documenting Provider Last Dose Status Informant  acetaminophen (TYLENOL) 500 MG tablet [id number] Yes Take 500 mg by mouth every 6 (six) hours as needed for mild pain. [provider] Taking Active   albuterol (VENTOLIN HFA) 108 (90 Base) MCG/ACT inhaler [id number] Yes Inhale 1-2 puffs into the lungs every 6 (six) hours as needed for wheezing or shortness of breath. Charlott Holler, MD Taking Active Spouse/Significant Other           Med Note Michaela Corner   Tue Mar 11, 2023 12:58 PM) 03/11/23: reports during [facility] call unable to review medications- wife not at home- she manages medications  arformoterol (BROVANA) 15 MCG/2ML NEBU [id number] No Take 2 mLs (15 mcg total) by nebulization in the morning and at bedtime. DX J96.11 J84.9 M06.9  Patient not taking: Reported on 03/24/2023  Charlott Holler, MD Not Taking Active   ascorbic acid (VITAMIN C) 500 MG tablet [id number] No Take 500 mg by mouth daily.  Patient not taking: Reported on 03/31/2023   [provider] Not Taking Active Spouse/Significant Other  aspirin EC 81 MG tablet [id number] Yes Take 81 mg by mouth every morning. [provider] Taking Active Spouse/Significant Other  azelastine (ASTELIN) 0.1 % nasal  spray [id number] No Place 2 sprays into both nostrils 2 (two) times daily. Use in each nostril as directed  Patient not taking: Reported on 03/31/2023   Ferol Luz, MD Not Taking Active Spouse/Significant Other  Brimonidine Tartrate (LUMIFY) 0.025 % SOLN [id number] Yes Place 1 drop into both eyes daily as needed (dry eyes). [provider] Taking Active Spouse/Significant Other  calcium carbonate (TUMS - DOSED IN MG ELEMENTAL CALCIUM) 500 MG chewable tablet [id number] Yes Chew 1 tablet by mouth daily. [provider] Taking Active   carvedilol (COREG) 3.125 MG tablet [id number] Yes Take 3.125 mg by mouth 2 (two) times daily with a meal. [provider] Taking Active Spouse/Significant Other  cholestyramine (QUESTRAN) 4 g packet [id number] Yes Take 4 g by mouth 2 (two) times daily. [provider] Taking Active Spouse/Significant Other  dextromethorphan (DELSYM) 30 MG/5ML liquid [id number] No Take 5 mLs (30 mg total) by mouth at bedtime as needed for cough.  Patient not taking: Reported on 03/31/2023   Marrianne Mood, MD Not Taking Active Spouse/Significant Other           Med Note (SATTERFIELD, Jaynie Crumble Mar 09, 2023  6:04 PM) Still has on hand   famotidine (PEPCID) 20 MG tablet [id number] No Take 1 tablet (20 mg total) by mouth 2 (two) times daily.  Patient not taking: Reported on 03/31/2023   Georgina Quint, MD Not Taking Active Spouse/Significant Other           Med Note Michele Rockers Mar 31, 2023 10:41 AM) Using PRN  fluticasone (FLONASE) 50 MCG/ACT nasal spray [id number] No Place 1 spray into both nostrils in the morning and at bedtime.  Patient not taking: Reported on 03/31/2023   [provider] Not Taking Active Spouse/Significant Other  guaiFENesin (MUCINEX) 600 MG 12 hr tablet [id number] Yes Take 600 mg by mouth 2 (two) times daily. [provider] Taking Active   levocetirizine (XYZAL) 5 MG tablet [id number] No  TAKE 1 TABLET BY MOUTH ONCE DAILY IN THE EVENING  Patient not taking: Reported on 03/31/2023   Ferol Luz, MD Not Taking Active Spouse/Significant Other  megestrol (MEGACE) 40 MG/ML suspension [id number] Yes Take 400 mg by mouth daily. [provider] Taking Active Spouse/Significant Other  Multiple Vitamin (MULTIVITAMIN WITH MINERALS) TABS tablet [id number] No Place 1 tablet into feeding tube daily.  Patient not taking: Reported on 03/31/2023   Marrianne Mood, MD Not Taking Active Spouse/Significant Other  nitroGLYCERIN (NITROSTAT) 0.4 MG SL tablet [id number] Yes Place 0.4 mg under the tongue every 5 (five) minutes as needed for chest pain. [provider] Taking Active Spouse/Significant Other           Med Note Camila Li, Simon Rhein Mar 31, 2023 10:42 AM) Has not needed  Nutritional Supplements (FEEDING SUPPLEMENT, OSMOLITE 1.5 CAL,) LIQD [id number] Yes Place 1,000 mLs into feeding tube continuous. Marrianne Mood, MD Taking Active Spouse/Significant Other  ondansetron (ZOFRAN) 4 MG tablet [id number] No Take 4 mg by mouth every 8 (eight) hours as needed for nausea  or vomiting.  Patient not taking: Reported on 03/31/2023   [provider] Not Taking Active            Med Note Michele Rockers Mar 31, 2023 10:47 AM) Has not needed  Probiotic Product (PROBIOTIC BLEND PO) [id number] Yes Take 1 capsule by mouth daily. [provider] Taking Active Spouse/Significant Other  Protein (FEEDING SUPPLEMENT, PROSOURCE TF20,) liquid [id number] No Place 60 mLs into feeding tube daily.  Patient not taking: Reported on 03/31/2023   Marrianne Mood, MD Not Taking Active Spouse/Significant Other  revefenacin (YUPELRI) 175 MCG/3ML nebulizer solution [id number] No Take 3 mLs (175 mcg total) by nebulization daily.  Patient not taking: Reported on 03/31/2023   Charlott Holler, MD Not Taking Active   riTUXimab [name] IV) [id number] Yes Inject into the vein.  [provider] Taking Active Spouse/Significant Other           Med Note (SATTERFIELD, Genoveva Ill   Sun Mar 09, 2023  6:03 PM) Patient and family member states he is due for a dose this month in September however not sure of exact date this month   Testosterone Enanthate (XYOSTED) 75 MG/0.5ML SOAJ [id number] Yes Inject 75 mg into the skin once a week. Georgina Quint, MD Taking Active Spouse/Significant Other           Med Note (SATTERFIELD, Jaynie Crumble Mar 09, 2023  6:06 PM) No specific day and spouse patient and spouse can't remember what exact date taken last week   Vibegron (GEMTESA) 75 MG TABS [id number] No Take 1 tablet (75 mg total) by mouth daily at 6 (six) AM.  Patient not taking: Reported on 03/24/2023   Georgina Quint, MD Not Taking Active Spouse/Significant Other  zinc sulfate 220 (50 Zn) MG capsule [id number] Yes Take 220 mg by mouth daily. [provider]  Active            Med Note Camila Li, Marquita Lias R   Mon Mar 31, 2023 11:00 AM) Has not been able to get from pharmacy              Assessment/Plan:   - Will send refill requests megestrol, carvedilol, famotidine - recommend continuing Mucinex 600 mg BID, will send rx so can get through insurance - For increased BM, He is taking cholestyramine 4g BID - recommend increasing to 4 g TID - For increased, coughing and drainage, recommend restarting daily Xyzal. If no improvement after a couple of weeks, restart Flonase daily. - For nebulizer meds, Dr. Lenor Derrick entered codes needed for insurance to cover [place]. Continue taking Anoro until receives new meds. Wife will call pharmacies to check on status. - Regarding thrush, recommended increasing nystatin to 4x daily (per prescription) - Regarding feeding supplements, am reaching out to care coordination nurse to see who should be doing feeding supplementation monitoring/adjustments.    Follow Up Plan: Can call as needed, direct number  provided   Arbutus Leas, PharmD, BCPS [facility] [phone]

## 2023-03-31 NOTE — Patient Outreach (Signed)
Care Coordination   Care Coordination  Visit Note   03/31/2023 Name: Earl Thomas MRN: [id number] DOB: 09-Jan-1951  Earl Thomas is a 72 y.o. year old male who sees Sagardia, Eilleen Kempf, MD for primary care. RNCM did not speak with patient during this encounter.   Care Coordination: Clinical pharmacist with questions regarding tube feeding. RNCM informed clinical pharmacist: Per review of chart tube placed on 10/03/22 by IR. Patient seen by dietician on 10/07/22 and 10/10/22. No Gastroenterologist noted. Encouraged follow up with Primary care provider.   Goals Addressed             This Visit's Progress    Health management       Interventions Today    Flowsheet Row Most Recent Value  General Interventions   General Interventions Discussed/Reviewed Communication with  Communication with Pharmacists  [communicated with clinical pharmacist, Candy Sledge regarding patient tube feeding supplement]            SDOH assessments and interventions completed:  No  Care Coordination Interventions:  Yes, provided   Follow up plan:  continue to follow as previously scheduled    Encounter Outcome:  Patient Visit Completed   Kathyrn Sheriff, RN, MSN, BSN, CCM Care Management Coordinator [phone]

## 2023-03-31 NOTE — Patient Instructions (Addendum)
It was a pleasure speaking with you today!  -Sent refills for megesterol, carvedilol, and famotidine to last until your next appt with Dr. Alvy Bimler.  -Recommend increasing cholestyramine to 4g three times daily to help with bowel movements.  -Recommend restarting Xyzal daily to help with coughing and drainage. If no improvement after 2 weeks, restart Flonase nasal spray once daily.  -Continue taking Anoro inhaler until you receive arformorterol and Yupelri medications for the nebulizer. Contact [facility] and [facility] to check on the status of those medications.  -Increase nystatin to 4x daily per the prescription to help with thrush.  -I have sent a referral to medical nutrition services to make sure you are getting the correct feeding supplements after hospital discharge.   For questions or concerns, feel free to reach out!  Arbutus Leas, PharmD, BCPS [facility] [phone]

## 2023-04-01 ENCOUNTER — Telehealth: Payer: Self-pay | Admitting: Emergency Medicine

## 2023-04-01 DIAGNOSIS — U071 COVID-19: Secondary | ICD-10-CM | POA: Diagnosis not present

## 2023-04-01 DIAGNOSIS — M459 Ankylosing spondylitis of unspecified sites in spine: Secondary | ICD-10-CM | POA: Diagnosis not present

## 2023-04-01 DIAGNOSIS — J1282 Pneumonia due to coronavirus disease 2019: Secondary | ICD-10-CM | POA: Diagnosis not present

## 2023-04-01 DIAGNOSIS — I110 Hypertensive heart disease with heart failure: Secondary | ICD-10-CM | POA: Diagnosis not present

## 2023-04-01 DIAGNOSIS — J84112 Idiopathic pulmonary fibrosis: Secondary | ICD-10-CM | POA: Diagnosis not present

## 2023-04-01 DIAGNOSIS — I5040 Unspecified combined systolic (congestive) and diastolic (congestive) heart failure: Secondary | ICD-10-CM | POA: Diagnosis not present

## 2023-04-01 NOTE — Telephone Encounter (Signed)
Donita from [facility] [phone]  - FYI - patient states that he has had increased coughing and that nurse is hearing increased crackles on the right side.

## 2023-04-01 NOTE — Telephone Encounter (Signed)
I would recommend visit at office within 1 day. If any increased shortness of breath urgent care or ER if cannot be seen today.

## 2023-04-02 ENCOUNTER — Encounter: Payer: Self-pay | Admitting: Internal Medicine

## 2023-04-02 ENCOUNTER — Ambulatory Visit: Payer: Medicare Other | Admitting: Internal Medicine

## 2023-04-02 VITALS — BP 102/72 | HR 103 | Temp 97.6°F | Ht 65.0 in

## 2023-04-02 DIAGNOSIS — J1282 Pneumonia due to coronavirus disease 2019: Secondary | ICD-10-CM | POA: Diagnosis not present

## 2023-04-02 DIAGNOSIS — J9611 Chronic respiratory failure with hypoxia: Secondary | ICD-10-CM | POA: Diagnosis not present

## 2023-04-02 DIAGNOSIS — I5040 Unspecified combined systolic (congestive) and diastolic (congestive) heart failure: Secondary | ICD-10-CM | POA: Diagnosis not present

## 2023-04-02 DIAGNOSIS — J9621 Acute and chronic respiratory failure with hypoxia: Secondary | ICD-10-CM | POA: Diagnosis not present

## 2023-04-02 DIAGNOSIS — M359 Systemic involvement of connective tissue, unspecified: Secondary | ICD-10-CM | POA: Diagnosis not present

## 2023-04-02 DIAGNOSIS — J8417 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: Secondary | ICD-10-CM | POA: Diagnosis not present

## 2023-04-02 DIAGNOSIS — J84112 Idiopathic pulmonary fibrosis: Secondary | ICD-10-CM | POA: Diagnosis not present

## 2023-04-02 DIAGNOSIS — D352 Benign neoplasm of pituitary gland: Secondary | ICD-10-CM | POA: Diagnosis not present

## 2023-04-02 DIAGNOSIS — I110 Hypertensive heart disease with heart failure: Secondary | ICD-10-CM | POA: Diagnosis not present

## 2023-04-02 DIAGNOSIS — I700 Atherosclerosis of aorta: Secondary | ICD-10-CM | POA: Diagnosis not present

## 2023-04-02 DIAGNOSIS — C189 Malignant neoplasm of colon, unspecified: Secondary | ICD-10-CM | POA: Diagnosis not present

## 2023-04-02 DIAGNOSIS — U071 COVID-19: Secondary | ICD-10-CM | POA: Diagnosis not present

## 2023-04-02 DIAGNOSIS — M459 Ankylosing spondylitis of unspecified sites in spine: Secondary | ICD-10-CM | POA: Diagnosis not present

## 2023-04-02 DIAGNOSIS — E43 Unspecified severe protein-calorie malnutrition: Secondary | ICD-10-CM | POA: Diagnosis not present

## 2023-04-02 MED ORDER — CHOLESTYRAMINE 4 G PO PACK: 4.0000 g | PACK | Freq: Three times a day (TID) | ORAL | 2 refills | Status: DC

## 2023-04-02 MED ORDER — GUAIFENESIN ER 600 MG PO TB12
600.0000 mg | ORAL_TABLET | Freq: Two times a day (BID) | ORAL | 3 refills | Status: DC
Start: 2023-04-02 — End: 2023-06-03

## 2023-04-02 NOTE — Patient Instructions (Addendum)
? ? ? ?  ? ? ?  Medications changes include :  none  ? ? ? ? ? ?Return if symptoms worsen or fail to improve. ? ?

## 2023-04-02 NOTE — Progress Notes (Signed)
Subjective:    Patient ID: Earl Thomas, male    DOB: December 24, 1950, 72 y.o.   MRN: [id number]      HPI Earl Thomas is here for  Chief Complaint  Patient presents with   Cough    Nurse was out on yesterday and heard some crackling sounds in his lungs and advised to have him seen.     Nurse came out yesterday and heard crackles at the base of his lung and advised that he come in for evaluation.  He states he would not of come in if she did not recommended.  She has not seen him previously.    He does have interstitial lung disease and at baseline has bibasilar crackles.  He is coughing a little more than his baseline he thinks.  Has chronic cough - not more wet.  He has chronic shortness of breath and feels that is at his baseline.  He denies any wheezing or tightness in the lungs.  He denies cold symptoms.  He is chronically on 4 L of oxygen via nasal cannula-occasionally he does need to increase this to 5 L which is not new.      Medications and allergies reviewed with patient and updated if appropriate.  Current Outpatient Medications on File Prior to Visit  Medication Sig Dispense Refill   acetaminophen (TYLENOL) 500 MG tablet Take 500 mg by mouth every 6 (six) hours as needed for mild pain.     albuterol (VENTOLIN HFA) 108 (90 Base) MCG/ACT inhaler Inhale 1-2 puffs into the lungs every 6 (six) hours as needed for wheezing or shortness of breath. 18 g 5   arformoterol (BROVANA) 15 MCG/2ML NEBU Take 2 mLs (15 mcg total) by nebulization in the morning and at bedtime. DX J96.11 J84.9 M06.9 (Patient not taking: Reported on 03/24/2023) 120 mL 6   ascorbic acid (VITAMIN C) 500 MG tablet Take 500 mg by mouth daily. (Patient not taking: Reported on 03/31/2023)     aspirin EC 81 MG tablet Take 81 mg by mouth every morning.     azelastine (ASTELIN) 0.1 % nasal spray Place 2 sprays into both nostrils 2 (two) times daily. Use in each nostril as directed (Patient not taking: Reported  on 03/31/2023) 30 mL 12   Brimonidine Tartrate (LUMIFY) 0.025 % SOLN Place 1 drop into both eyes daily as needed (dry eyes).     calcium carbonate (TUMS - DOSED IN MG ELEMENTAL CALCIUM) 500 MG chewable tablet Chew 1 tablet by mouth daily.     carvedilol (COREG) 3.125 MG tablet Take 1 tablet (3.125 mg total) by mouth 2 (two) times daily with a meal. 60 tablet 0   cholestyramine (QUESTRAN) 4 g packet Take 1 packet (4 g total) by mouth 3 (three) times daily. 90 each 2   dextromethorphan (DELSYM) 30 MG/5ML liquid Take 5 mLs (30 mg total) by mouth at bedtime as needed for cough. (Patient not taking: Reported on 03/31/2023)     famotidine (PEPCID) 20 MG tablet Take 1 tablet (20 mg total) by mouth 2 (two) times daily. 60 tablet 0   fluticasone (FLONASE) 50 MCG/ACT nasal spray Place 1 spray into both nostrils in the morning and at bedtime. (Patient not taking: Reported on 03/31/2023)     guaiFENesin (MUCINEX) 600 MG 12 hr tablet Take 1 tablet (600 mg total) by mouth 2 (two) times daily. 180 tablet 3   levocetirizine (XYZAL) 5 MG tablet TAKE 1 TABLET BY MOUTH ONCE DAILY IN THE  EVENING (Patient not taking: Reported on 03/31/2023) 90 tablet 0   megestrol (MEGACE) 40 MG/ML suspension Take 10 mLs (400 mg total) by mouth daily. 240 mL 1   Multiple Vitamin (MULTIVITAMIN WITH MINERALS) TABS tablet Place 1 tablet into feeding tube daily. (Patient not taking: Reported on 03/31/2023)     nitroGLYCERIN (NITROSTAT) 0.4 MG SL tablet Place 0.4 mg under the tongue every 5 (five) minutes as needed for chest pain.     Nutritional Supplements (FEEDING SUPPLEMENT, OSMOLITE 1.5 CAL,) LIQD Place 1,000 mLs into feeding tube continuous.     ondansetron (ZOFRAN) 4 MG tablet Take 4 mg by mouth every 8 (eight) hours as needed for nausea or vomiting. (Patient not taking: Reported on 03/31/2023)     Probiotic Product (PROBIOTIC BLEND PO) Take 1 capsule by mouth daily.     Protein (FEEDING SUPPLEMENT, PROSOURCE TF20,) liquid Place 60 mLs into  feeding tube daily. (Patient not taking: Reported on 03/31/2023)     revefenacin (YUPELRI) 175 MCG/3ML nebulizer solution Take 3 mLs (175 mcg total) by nebulization daily. (Patient not taking: Reported on 03/31/2023) 90 mL 3   riTUXimab (RITUXAN IV) Inject into the vein.     Testosterone Enanthate (XYOSTED) 75 MG/0.5ML SOAJ Inject 75 mg into the skin once a week. 1.96 mL 5   Vibegron (GEMTESA) 75 MG TABS Take 1 tablet (75 mg total) by mouth daily at 6 (six) AM. (Patient not taking: Reported on 03/24/2023) 30 tablet 3   zinc sulfate 220 (50 Zn) MG capsule Take 220 mg by mouth daily.     No current facility-administered medications on file prior to visit.    Review of Systems  Constitutional:  Negative for fever.  HENT:  Negative for congestion, ear pain, sinus pressure, sinus pain and sore throat.   Respiratory:  Positive for cough and shortness of breath (chronic at baseline). Negative for chest tightness and wheezing.   Cardiovascular:  Positive for chest pain (sometimes  - not new).  Neurological:  Negative for light-headedness and headaches.       Objective:   Vitals:   04/02/23 1606  BP: 102/72  Pulse: (!) 103  Temp: 97.6 F (36.4 C)  SpO2: 98%   BP Readings from Last 3 Encounters:  04/02/23 102/72  03/17/23 114/82  03/11/23 108/64   Wt Readings from Last 3 Encounters:  03/17/23 93 lb 9.6 oz (42.5 kg)  03/11/23 94 lb 12.8 oz (43 kg)  03/09/23 108 lb 3.9 oz (49.1 kg)   Body mass index is 15.58 kg/m.    Physical Exam Constitutional:      General: He is not in acute distress.    Appearance: Normal appearance. He is not ill-appearing.  HENT:     Head: Normocephalic and atraumatic.  Eyes:     Conjunctiva/sclera: Conjunctivae normal.  Cardiovascular:     Rate and Rhythm: Normal rate and regular rhythm.     Heart sounds: Normal heart sounds.  Pulmonary:     Effort: Pulmonary effort is normal. No respiratory distress.     Breath sounds: Rales (Bibasilar-much more on  the right side) present. No wheezing.  Musculoskeletal:     Right lower leg: No edema.     Left lower leg: No edema.  Skin:    General: Skin is warm and dry.     Findings: No rash.  Neurological:     Mental Status: He is alert. Mental status is at baseline.  Psychiatric:        Mood  and Affect: Mood normal.         DG CHEST PORT 1 VIEW CLINICAL DATA:  Hypoxia.  EXAM: PORTABLE CHEST 1 VIEW  COMPARISON:  03/08/2023  FINDINGS: Advanced changes of chronic interstitial lung disease again noted. No new focal consolidative opacity. No pleural effusion. The cardiopericardial silhouette is within normal limits for size. No acute bony abnormality. Gastrostomy tube overlies the left upper quadrant.  IMPRESSION: Advanced changes of chronic interstitial lung disease without new focal airspace consolidation.  Electronically Signed   By: Kennith Center M.D.   On: 03/09/2023 15:14     Assessment & Plan:    Interstitial lung disease, chronic hypoxic respiratory failure: Chronic Here today because a new nurse came out yesterday and listen to his lungs and heard crackles and was concerned and advised that he be seen Has bibasilar crackles-more pronounced on right and are dry in nature consistent with his interstitial lung disease Chronic shortness of breath-without change Chronic cough-?  Worse or not He states he would not have come in today if it was not for what the nurse advised-I do think his symptoms are probably at their baseline although his baseline varies slightly day-to-day I do not think there is any acute issues Hold off on treatment for now He and his wife will monitor very closely over the next 24-48 hours and if symptoms get worse-worsening cough or any change in shortness of breath or oxygen needs he will call immediately so that we can start treatment Has routine follow-up with pulmonary next week

## 2023-04-02 NOTE — Telephone Encounter (Signed)
Patient is coming in to see Dr. Lawerance Bach at 3:40pm

## 2023-04-07 DIAGNOSIS — I5040 Unspecified combined systolic (congestive) and diastolic (congestive) heart failure: Secondary | ICD-10-CM | POA: Diagnosis not present

## 2023-04-07 DIAGNOSIS — G7281 Critical illness myopathy: Secondary | ICD-10-CM | POA: Diagnosis not present

## 2023-04-07 DIAGNOSIS — Z7951 Long term (current) use of inhaled steroids: Secondary | ICD-10-CM | POA: Diagnosis not present

## 2023-04-07 DIAGNOSIS — D352 Benign neoplasm of pituitary gland: Secondary | ICD-10-CM | POA: Diagnosis not present

## 2023-04-07 DIAGNOSIS — E785 Hyperlipidemia, unspecified: Secondary | ICD-10-CM | POA: Diagnosis not present

## 2023-04-07 DIAGNOSIS — U071 COVID-19: Secondary | ICD-10-CM | POA: Diagnosis not present

## 2023-04-07 DIAGNOSIS — E43 Unspecified severe protein-calorie malnutrition: Secondary | ICD-10-CM | POA: Diagnosis not present

## 2023-04-07 DIAGNOSIS — Z556 Problems related to health literacy: Secondary | ICD-10-CM | POA: Diagnosis not present

## 2023-04-07 DIAGNOSIS — K219 Gastro-esophageal reflux disease without esophagitis: Secondary | ICD-10-CM | POA: Diagnosis not present

## 2023-04-07 DIAGNOSIS — Z9981 Dependence on supplemental oxygen: Secondary | ICD-10-CM | POA: Diagnosis not present

## 2023-04-07 DIAGNOSIS — A0472 Enterocolitis due to Clostridium difficile, not specified as recurrent: Secondary | ICD-10-CM | POA: Diagnosis not present

## 2023-04-07 DIAGNOSIS — M359 Systemic involvement of connective tissue, unspecified: Secondary | ICD-10-CM | POA: Diagnosis not present

## 2023-04-07 DIAGNOSIS — C189 Malignant neoplasm of colon, unspecified: Secondary | ICD-10-CM | POA: Diagnosis not present

## 2023-04-07 DIAGNOSIS — Z7952 Long term (current) use of systemic steroids: Secondary | ICD-10-CM | POA: Diagnosis not present

## 2023-04-07 DIAGNOSIS — Z792 Long term (current) use of antibiotics: Secondary | ICD-10-CM | POA: Diagnosis not present

## 2023-04-07 DIAGNOSIS — J1282 Pneumonia due to coronavirus disease 2019: Secondary | ICD-10-CM | POA: Diagnosis not present

## 2023-04-07 DIAGNOSIS — J84112 Idiopathic pulmonary fibrosis: Secondary | ICD-10-CM | POA: Diagnosis not present

## 2023-04-07 DIAGNOSIS — M459 Ankylosing spondylitis of unspecified sites in spine: Secondary | ICD-10-CM | POA: Diagnosis not present

## 2023-04-07 DIAGNOSIS — I110 Hypertensive heart disease with heart failure: Secondary | ICD-10-CM | POA: Diagnosis not present

## 2023-04-07 DIAGNOSIS — I700 Atherosclerosis of aorta: Secondary | ICD-10-CM | POA: Diagnosis not present

## 2023-04-07 DIAGNOSIS — J309 Allergic rhinitis, unspecified: Secondary | ICD-10-CM | POA: Diagnosis not present

## 2023-04-07 DIAGNOSIS — J9621 Acute and chronic respiratory failure with hypoxia: Secondary | ICD-10-CM | POA: Diagnosis not present

## 2023-04-07 DIAGNOSIS — G9332 Myalgic encephalomyelitis/chronic fatigue syndrome: Secondary | ICD-10-CM | POA: Diagnosis not present

## 2023-04-08 DIAGNOSIS — M0579 Rheumatoid arthritis with rheumatoid factor of multiple sites without organ or systems involvement: Secondary | ICD-10-CM | POA: Diagnosis not present

## 2023-04-09 DIAGNOSIS — J84112 Idiopathic pulmonary fibrosis: Secondary | ICD-10-CM | POA: Diagnosis not present

## 2023-04-09 DIAGNOSIS — I5040 Unspecified combined systolic (congestive) and diastolic (congestive) heart failure: Secondary | ICD-10-CM | POA: Diagnosis not present

## 2023-04-09 DIAGNOSIS — I110 Hypertensive heart disease with heart failure: Secondary | ICD-10-CM | POA: Diagnosis not present

## 2023-04-09 DIAGNOSIS — J1282 Pneumonia due to coronavirus disease 2019: Secondary | ICD-10-CM | POA: Diagnosis not present

## 2023-04-09 DIAGNOSIS — M459 Ankylosing spondylitis of unspecified sites in spine: Secondary | ICD-10-CM | POA: Diagnosis not present

## 2023-04-09 DIAGNOSIS — U071 COVID-19: Secondary | ICD-10-CM | POA: Diagnosis not present

## 2023-04-11 ENCOUNTER — Encounter: Payer: Self-pay | Admitting: Internal Medicine

## 2023-04-11 ENCOUNTER — Ambulatory Visit (INDEPENDENT_AMBULATORY_CARE_PROVIDER_SITE_OTHER): Payer: Medicare Other | Admitting: Internal Medicine

## 2023-04-11 VITALS — BP 96/66 | HR 112 | Temp 97.2°F | Ht 65.0 in | Wt 97.2 lb

## 2023-04-11 DIAGNOSIS — J849 Interstitial pulmonary disease, unspecified: Secondary | ICD-10-CM | POA: Diagnosis not present

## 2023-04-11 DIAGNOSIS — J709 Respiratory conditions due to unspecified external agent: Secondary | ICD-10-CM | POA: Diagnosis not present

## 2023-04-11 DIAGNOSIS — J411 Mucopurulent chronic bronchitis: Secondary | ICD-10-CM

## 2023-04-11 DIAGNOSIS — J9611 Chronic respiratory failure with hypoxia: Secondary | ICD-10-CM

## 2023-04-11 NOTE — Progress Notes (Unsigned)
Earl Thomas    [id number]    04/20/1951  Primary Care Physician:Sagardia, Eilleen Kempf, MD Date of Appointment: 04/14/2023 Established Patient Visit  Chief complaint:   Chief Complaint  Patient presents with   Follow-up    HPI: Earl Thomas is a 72 y.o. gentleman with history of RA-ILD. Intolerant of Ofev due to diarrhea. Had progression on  esbriet as well as weight loss and diarrhea. Intolerant of methotrexate. Currently on rituxumab. Has seen [facility] and is not currently transplant candidate due to weight. Has had a G tube placed to help with nutritional supplementation. Prolonged hospitalizations in Summer 2024 with ICU stay not intubated and then transfer to LTAC.   Interval Updates: Here for follow up. Put on 2 lbs. On 4-5L [place]. Concentrator at home only goes up to 5L. Needs up to 6-7 to recover. Never got brovana or yupelri. Went to the [employer] candy store in [place] for his birthday. Otherwise in good spirits.   I have reviewed the patient's family social and past medical history and updated as appropriate.   Past Medical History:  Diagnosis Date   Abnormal weight loss 12/04/2021   Allergic rhinitis 10/11/2022   Aortic atherosclerosis (HCC) 12/04/2021   Arthralgia of temporomandibular joint 12/04/2021   Arthritis    BMI 26.0-26.9,adult 10/22/2016   Chronic bronchitis (HCC) 06/17/2022   Chronic diastolic CHF (congestive heart failure) (HCC) 01/19/2022   Chronic fatigue syndrome 12/04/2021   Chronic hypoxic respiratory failure (HCC) 08/30/2022   Dyslipidemia 03/10/2012   Family history of prostate cancer 12/04/2021   GERD (gastroesophageal reflux disease) 01/19/2022   Glucose intolerance (impaired glucose tolerance)    Hemorrhage of rectum and anus 10/17/2022   Hyperlipidemia    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   Screening for malignant neoplasm of colon 10/17/2022   Skin lesion of chest wall 12/04/2021    Past Surgical History:  Procedure Laterality Date   IR GASTROSTOMY TUBE MOD SED  10/03/2022   ROTATOR CUFF REPAIR Right 01/02/16    Family History  Problem Relation Age of Onset   Cancer Father 49       prostate cancer   Diabetes Father    Heart disease Father 69       AMI late 49s   Cancer Mother 65       Breast cancer   Heart disease Mother        CABG at age 71   Diabetes Brother    Heart disease Brother        AMI x 2; CABG   Hyperlipidemia Brother     Social History   Occupational History   Occupation: Professor  Tobacco Use   Smoking status: Former    Types: Pipe    Quit date: 10/06/1988    Years since quitting: 34.5    Passive exposure: Never   Smokeless tobacco: Never  Tobacco comments:    parent smoked in home as a child.  Smoked a pip in college, not daily.  Vaping Use   Vaping status: Never Used  Substance and Sexual Activity   Alcohol use: No   Drug use: No   Sexual activity: Yes    Physical Exam: Blood pressure 96/66, pulse (!) 112, temperature (!) 97.2 F (36.2 C), temperature source Oral, height 5\' 5"  (1.651 m), weight 97 lb 3.2 oz (44.1 kg), SpO2 94%.  Gen:     Tthin on nasal cannula Lungs:    severely dyspnic at rest. 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   Echocardiogram 1. Left ventricular ejection fraction, by estimation, is 60 to 65%. The  left ventricle has normal function. The left ventricle has no regional  wall motion abnormalities. Left ventricular diastolic parameters are  consistent with Grade I diastolic  dysfunction (impaired relaxation).   2. Right ventricular systolic function is normal. The right ventricular  size is normal. There is normal pulmonary artery systolic pressure.   3. The mitral valve is normal in structure. Mild mitral valve  regurgitation. No evidence of mitral stenosis.   4. The aortic valve is normal in structure. Aortic valve regurgitation is  not visualized. No aortic stenosis is present.   5. The inferior vena cava is normal in size with greater than 50%  respiratory variability, suggesting right atrial pressure of 3 mmHg.   PFTs:      Latest Ref Rng & Units 10/29/2021    3:00 PM 04/18/2021    3:05 PM 11/13/2020    3:07 PM 08/07/2020   10:04 AM 07/26/2019   10:56 AM  PFT Results  FVC-Pre L 1.22  1.71  1.71  1.37  1.52   FVC-Predicted Pre % 38  54  54  43  47   FVC-Post L     1.82   FVC-Predicted Post %     57   Pre FEV1/FVC % % 99  94  92  100  100   Post FEV1/FCV % %     88   FEV1-Pre L 1.21  1.61  1.57  1.37  1.52   FEV1-Predicted Pre % 50  67  65  57  63   FEV1-Post L     1.61   DLCO uncorrected ml/min/mmHg 8.80  10.08  11.16  9.68  11.79   DLCO UNC% % 39  45  49  42  52   DLCO corrected ml/min/mmHg 8.80   11.16  9.68    DLCO COR %Predicted % 39   49  42    DLVA Predicted % 55  71  104  73  82   TLC L     3.42   TLC % Predicted %     57   RV % Predicted %     84    I have personally reviewed the patient's PFTs and which show moderate restriction with an FVC of 54% which is stable from previous PFTs.  PFTs at [facility] Jan 2024 show FVC 1.08, dlco severely reduced  Jan 2024 Walked 972  ft on 2L ( ) which is decreased from 1041 ft in August.    Labs: I have reviewed his outside hospital labs noted on March 23 2019 progress note, his CCP level was greater  than 255, Rheumatoid factor was 149 his, ESR was 109 his uric acid was normal.  Lab Results  Component Value Date   NA 133 (L) 03/10/2023   K 4.9 03/10/2023   CO2 32 03/10/2023   GLUCOSE 122 (H) 03/10/2023   BUN 19 03/10/2023   CREATININE 0.88 03/10/2023   CALCIUM 8.3 (L) 03/10/2023   GFRNONAA >60 03/10/2023   Lab Results  Component Value Date   WBC 11.5 (H) 03/10/2023   HGB 16.5 03/10/2023   HCT 51.5 03/10/2023   MCV 85.1 03/10/2023   PLT 217 03/10/2023     Immunization status: Immunization History  Administered Date(s) Administered   Fluad Quad(high Dose 65+) 04/13/2020, 04/18/2021, 03/11/2022   Fluad Trivalent(High Dose 65+) 03/17/2023   Hep A / Hep B 02/12/2022   Influenza, High Dose Seasonal PF 04/24/2019   Influenza,inj,Quad PF,6+ Mos 05/30/2015, 04/23/2016, 05/14/2017, 03/25/2018   Influenza-Unspecified 04/07/2014   PFIZER(Purple Top)SARS-COV-2 Vaccination 07/30/2019, 08/20/2019, 04/27/2020   PNEUMOCOCCAL CONJUGATE-20 12/25/2020   Pfizer Covid-19 Vaccine Bivalent Booster 45yrs & up 04/24/2021   Pneumococcal Conjugate-13 05/18/2018   Pneumococcal Polysaccharide-23 06/09/2019   Td 03/10/2021   Tdap 02/13/2009, 05/14/2017   Zoster Recombinant(Shingrix) 05/27/2018, 01/19/2019   Zoster, Live 04/12/2014    Assessment:  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 Methotrexate stopped due to adverse effects  (gastrointestinal) continue rituximab and meloxicam for RA.  continue tube feeds.  I will send orders to [facility] for a concentrator up to 10L, and to send brovana and yupelri nebulized medications through them since you have medicare part B.   We reviewed the progression of his disease as well his clinical course over the last several months. Unfortunately he has a terrible disease and very aggressive RA. I suspect he has less than a year to live. I think lung transplantation with his recent deconditioning, weight loss, and increasing age and oxygen requirement is very unlikely unfortunately.   Have previously affirmed goals of care - in the event of respiratory decline he would want to be DNI.   Given his history of previous pipe smoking I believe he has a component of chronic bronchitis and therefore medications such as yupelri are clinically beneficially - he has been given these inpatient and had improved respiratory symptoms and improvement in hypoxemia.   I spent 45 minutes in the care of this patient today including pre-charting, chart review, review of results, face-to-face care, coordination of care and communication with consultants etc.).  Return to Care: Return in about 2 months (around 06/11/2023).    Durel Salts, MD Pulmonary and Critical Care Medicine [facility] Office:[phone]

## 2023-04-11 NOTE — Patient Instructions (Addendum)
It was a pleasure to see you today!  Please schedule follow up scheduled with myself in 2 months.  If my schedule is not open yet, we will contact you with a reminder closer to that time. Please call [phone] if you haven't heard from [place] a month before, and always call us sooner if issues or concerns arise. You can also send [place] a message through MyChart, but but aware that this is not to be used for urgent issues and it may take up to 5-7 days to receive a reply. Please be aware that you will likely be able to view your results before I have a chance to respond to them. Please give [place] 5 business days to respond to any non-urgent results.    I will send orders to [facility] for a concentrator up to 10L, and to send brovana and yupelri nebulized medications through them since you have medicare part B.   Call me sooner if you need me, as always.

## 2023-04-14 ENCOUNTER — Encounter: Payer: Self-pay | Admitting: Internal Medicine

## 2023-04-14 ENCOUNTER — Telehealth: Payer: Self-pay | Admitting: Internal Medicine

## 2023-04-14 NOTE — Telephone Encounter (Signed)
Notes and insurance have been faxed to [facility]- nothing further needed.

## 2023-04-14 NOTE — Addendum Note (Signed)
Addended by: Durel Salts on: 04/14/2023 02:54 PM   Modules accepted: Level of Service

## 2023-04-14 NOTE — Telephone Encounter (Signed)
[facility] needs billable diagnosis of J41-J70.9 or a COVID related diagnosis (J12.82, U07.1, or U09.9) within chart notes from 04/11/2023 as an active assessment to approve Yupelri. Please advise on adding notes or an alternative.

## 2023-04-14 NOTE — Telephone Encounter (Signed)
I have addended my clinic note from 10/4 as well as my documentation to include J41 diagnosis. Can we please fax this back to [facility]?

## 2023-04-15 DIAGNOSIS — I110 Hypertensive heart disease with heart failure: Secondary | ICD-10-CM | POA: Diagnosis not present

## 2023-04-15 DIAGNOSIS — J1282 Pneumonia due to coronavirus disease 2019: Secondary | ICD-10-CM | POA: Diagnosis not present

## 2023-04-15 DIAGNOSIS — I5040 Unspecified combined systolic (congestive) and diastolic (congestive) heart failure: Secondary | ICD-10-CM | POA: Diagnosis not present

## 2023-04-15 DIAGNOSIS — M459 Ankylosing spondylitis of unspecified sites in spine: Secondary | ICD-10-CM | POA: Diagnosis not present

## 2023-04-15 DIAGNOSIS — U071 COVID-19: Secondary | ICD-10-CM | POA: Diagnosis not present

## 2023-04-15 DIAGNOSIS — J84112 Idiopathic pulmonary fibrosis: Secondary | ICD-10-CM | POA: Diagnosis not present

## 2023-04-16 ENCOUNTER — Ambulatory Visit: Payer: Self-pay

## 2023-04-16 DIAGNOSIS — I5040 Unspecified combined systolic (congestive) and diastolic (congestive) heart failure: Secondary | ICD-10-CM | POA: Diagnosis not present

## 2023-04-16 DIAGNOSIS — M459 Ankylosing spondylitis of unspecified sites in spine: Secondary | ICD-10-CM | POA: Diagnosis not present

## 2023-04-16 DIAGNOSIS — J1282 Pneumonia due to coronavirus disease 2019: Secondary | ICD-10-CM | POA: Diagnosis not present

## 2023-04-16 DIAGNOSIS — U071 COVID-19: Secondary | ICD-10-CM | POA: Diagnosis not present

## 2023-04-16 DIAGNOSIS — I110 Hypertensive heart disease with heart failure: Secondary | ICD-10-CM | POA: Diagnosis not present

## 2023-04-16 DIAGNOSIS — J84112 Idiopathic pulmonary fibrosis: Secondary | ICD-10-CM | POA: Diagnosis not present

## 2023-04-16 NOTE — Patient Instructions (Signed)
Visit Information  Thank you for taking time to visit with me today. Please don't hesitate to contact me if I can be of assistance to you.   Following are the goals we discussed today:  Continue to take medications as prescribed. Continue to attend provider visits as scheduled Continue to eat healthy, lean meats, vegetables, fruits, avoid saturated and transfats Contact provider with health questions or concerns Contact your RN Case manager if you do not hear from [facility] within the week.   Our next appointment is by telephone on 04/30/23 at 1:30 pm  Please call the care guide team at [phone] if you need to cancel or reschedule your appointment.   If you are experiencing a Mental Health or Behavioral Health Crisis or need someone to talk to, please call the Suicide and Crisis Lifeline: 988 call the [place] National Suicide Prevention Lifeline: (249)347-9994 or TTY: 417-667-1297 TTY 2127393169) to talk to a trained counselor call 1-800-273-TALK (toll free, 24 hour hotline)  Kathyrn Sheriff, RN, MSN, BSN, CCM Care Management Coordinator [phone]

## 2023-04-16 NOTE — Patient Outreach (Signed)
Care Coordination   Follow Up Visit Note   04/16/2023 Name: Earl Thomas MRN: [id number] DOB: June 19, 1951  Earl Thomas is a 72 y.o. year old male who sees Sagardia, Eilleen Kempf, MD for primary care. I spoke with  Blossom Hoops by phone today.  What matters to the patients health and wellness today?  Patient denies any difficulty breathing today. He has followed up with pulmonologist on 04/11/23. per pulmonology notes on 04/11/23 order to [facility] re:  oxgyen concenterator and nebulizer medications. Patient reports he is awaiting a call from [facility]. Patient reports he has spoken with both clinical pharmacist and nutritionist. Per Nutritionist notes, patient does not need to see nutritionist. Patient states he does not have zinc. He states he has some medications to pick up from the pharmacy and will check with the pharmacy at that time. He denies any questions or concerns at this time.  Goals Addressed             This Visit's Progress    Health management       Interventions Today    Flowsheet Row Most Recent Value  Chronic Disease   Chronic disease during today's visit Other  [ILD]  General Interventions   General Interventions Discussed/Reviewed General Interventions Reviewed, Doctor Visits  [Evaluation of current treatment plan for health condition and patient's adherence to plan]  Doctor Visits Discussed/Reviewed Doctor Visits Reviewed, PCP, Specialist  PCP/Specialist Visits Compliance with follow-up visit  [reviewed scheduled/upcoming appointments]  Exercise Interventions   Exercise Discussed/Reviewed Exercise Discussed  [confirmed still active with home health [facility] for PT/OT]  Education Interventions   Education Provided Provided Education  Provided Verbal Education On Nutrition, Medication, When to see the doctor  Nutrition Interventions   Nutrition Discussed/Reviewed Nutrition Reviewed  [patient reports eating breakfast lunch and dinner including tube  feedings. he reports appetite is good.]  Pharmacy Interventions   Pharmacy Dicussed/Reviewed Pharmacy Topics Reviewed  [per review of chart, clinical pharmacist has contacted patient]  Safety Interventions   Safety Discussed/Reviewed Safety Reviewed            SDOH assessments and interventions completed:  No  Care Coordination Interventions:  Yes, provided   Follow up plan: Follow up call scheduled for 04/30/23    Encounter Outcome:  Patient Visit Completed   Kathyrn Sheriff, RN, MSN, BSN, CCM Care Management Coordinator [phone]

## 2023-04-17 DIAGNOSIS — J1282 Pneumonia due to coronavirus disease 2019: Secondary | ICD-10-CM | POA: Diagnosis not present

## 2023-04-17 DIAGNOSIS — I110 Hypertensive heart disease with heart failure: Secondary | ICD-10-CM | POA: Diagnosis not present

## 2023-04-17 DIAGNOSIS — U071 COVID-19: Secondary | ICD-10-CM | POA: Diagnosis not present

## 2023-04-17 DIAGNOSIS — J84112 Idiopathic pulmonary fibrosis: Secondary | ICD-10-CM | POA: Diagnosis not present

## 2023-04-17 DIAGNOSIS — I5040 Unspecified combined systolic (congestive) and diastolic (congestive) heart failure: Secondary | ICD-10-CM | POA: Diagnosis not present

## 2023-04-17 DIAGNOSIS — M459 Ankylosing spondylitis of unspecified sites in spine: Secondary | ICD-10-CM | POA: Diagnosis not present

## 2023-04-21 ENCOUNTER — Encounter: Payer: Self-pay | Admitting: Internal Medicine

## 2023-04-23 ENCOUNTER — Telehealth: Payer: Self-pay | Admitting: Emergency Medicine

## 2023-04-23 DIAGNOSIS — U071 COVID-19: Secondary | ICD-10-CM | POA: Diagnosis not present

## 2023-04-23 DIAGNOSIS — J1282 Pneumonia due to coronavirus disease 2019: Secondary | ICD-10-CM | POA: Diagnosis not present

## 2023-04-23 DIAGNOSIS — I110 Hypertensive heart disease with heart failure: Secondary | ICD-10-CM | POA: Diagnosis not present

## 2023-04-23 DIAGNOSIS — I5040 Unspecified combined systolic (congestive) and diastolic (congestive) heart failure: Secondary | ICD-10-CM | POA: Diagnosis not present

## 2023-04-23 DIAGNOSIS — M459 Ankylosing spondylitis of unspecified sites in spine: Secondary | ICD-10-CM | POA: Diagnosis not present

## 2023-04-23 DIAGNOSIS — J84112 Idiopathic pulmonary fibrosis: Secondary | ICD-10-CM | POA: Diagnosis not present

## 2023-04-23 NOTE — Telephone Encounter (Signed)
Prescription Request  04/23/2023  LOV: 03/17/2023  What is the name of the medication or equipment?  carvedilol (COREG) 3.125 MG tablet  Pt ask if he can get prescribed the  Probiotic Product (PROBIOTIC BLEND PO)   Have you contacted your pharmacy to request a refill? No   Which pharmacy would you like this sent to?    [facility] [address], [place] - [address] [place] [address] Phone: [phone] Fax: [phone]  Patient notified that their request is being sent to the clinical staff for review and that they should receive a response within 2 business days.   Please advise at Mobile [phone] (mobile)

## 2023-04-24 ENCOUNTER — Other Ambulatory Visit: Payer: Self-pay | Admitting: *Deleted

## 2023-04-24 DIAGNOSIS — M459 Ankylosing spondylitis of unspecified sites in spine: Secondary | ICD-10-CM | POA: Diagnosis not present

## 2023-04-24 DIAGNOSIS — J84112 Idiopathic pulmonary fibrosis: Secondary | ICD-10-CM | POA: Diagnosis not present

## 2023-04-24 DIAGNOSIS — U071 COVID-19: Secondary | ICD-10-CM | POA: Diagnosis not present

## 2023-04-24 DIAGNOSIS — I110 Hypertensive heart disease with heart failure: Secondary | ICD-10-CM | POA: Diagnosis not present

## 2023-04-24 DIAGNOSIS — I5040 Unspecified combined systolic (congestive) and diastolic (congestive) heart failure: Secondary | ICD-10-CM | POA: Diagnosis not present

## 2023-04-24 DIAGNOSIS — J1282 Pneumonia due to coronavirus disease 2019: Secondary | ICD-10-CM | POA: Diagnosis not present

## 2023-04-24 MED ORDER — CARVEDILOL 3.125 MG PO TABS: 3.1250 mg | ORAL_TABLET | Freq: Two times a day (BID) | ORAL | 0 refills | Status: AC

## 2023-04-24 NOTE — Telephone Encounter (Signed)
Patient is unsure of the dosage of the medication ( probiotic blend PO capsules) . Medication could be an OTC med.  Will call back to the office to provide the information

## 2023-04-25 DIAGNOSIS — J1282 Pneumonia due to coronavirus disease 2019: Secondary | ICD-10-CM | POA: Diagnosis not present

## 2023-04-25 DIAGNOSIS — Z23 Encounter for immunization: Secondary | ICD-10-CM | POA: Diagnosis not present

## 2023-04-25 DIAGNOSIS — I5040 Unspecified combined systolic (congestive) and diastolic (congestive) heart failure: Secondary | ICD-10-CM | POA: Diagnosis not present

## 2023-04-25 DIAGNOSIS — M459 Ankylosing spondylitis of unspecified sites in spine: Secondary | ICD-10-CM | POA: Diagnosis not present

## 2023-04-25 DIAGNOSIS — I110 Hypertensive heart disease with heart failure: Secondary | ICD-10-CM | POA: Diagnosis not present

## 2023-04-25 DIAGNOSIS — J84112 Idiopathic pulmonary fibrosis: Secondary | ICD-10-CM | POA: Diagnosis not present

## 2023-04-25 DIAGNOSIS — U071 COVID-19: Secondary | ICD-10-CM | POA: Diagnosis not present

## 2023-04-28 ENCOUNTER — Ambulatory Visit: Payer: Medicare Other | Admitting: Emergency Medicine

## 2023-04-29 DIAGNOSIS — I110 Hypertensive heart disease with heart failure: Secondary | ICD-10-CM | POA: Diagnosis not present

## 2023-04-29 DIAGNOSIS — U071 COVID-19: Secondary | ICD-10-CM | POA: Diagnosis not present

## 2023-04-29 DIAGNOSIS — J1282 Pneumonia due to coronavirus disease 2019: Secondary | ICD-10-CM | POA: Diagnosis not present

## 2023-04-29 DIAGNOSIS — M459 Ankylosing spondylitis of unspecified sites in spine: Secondary | ICD-10-CM | POA: Diagnosis not present

## 2023-04-29 DIAGNOSIS — J84112 Idiopathic pulmonary fibrosis: Secondary | ICD-10-CM | POA: Diagnosis not present

## 2023-04-29 DIAGNOSIS — I5040 Unspecified combined systolic (congestive) and diastolic (congestive) heart failure: Secondary | ICD-10-CM | POA: Diagnosis not present

## 2023-04-30 ENCOUNTER — Ambulatory Visit: Payer: Self-pay

## 2023-04-30 NOTE — Patient Outreach (Signed)
Care Coordination   Follow Up Visit Note   04/30/2023 Name: Earl Thomas MRN: [id number] DOB: [birth year]  Earl Thomas is a 72 y.o. year old male who sees Sagardia, Eilleen Kempf, MD for primary care. I spoke with  Earl Thomas by phone today.  What matters to the patients health and wellness today?  [name] reports he is doing well. He states he has received oxygen concentrator and has received nebulizer medications. He reports he is eating well. He denies any questions or concerns at this time.  Goals Addressed             This Visit's Progress    Health management       Interventions Today    Flowsheet Row Most Recent Value  General Interventions   General Interventions Discussed/Reviewed General Interventions Reviewed, Durable Medical Equipment (DME)  [Evaluation of current treatment plan for health condition and patient's adherence to plan.]  Doctor Visits Discussed/Reviewed Doctor Visits Reviewed  Annabell Sabal upcoming scheduled visits]  Durable Medical Equipment (DME) Oxygen  [confirmed patient has received new oxygen concentrator from [employer]]  Education Interventions   Education Provided Provided Education  Provided Verbal Education On Medication, When to see the doctor, Nutrition  [advised to continue to attend provider visits as scheduled, take medications as prescribed, contact provider with health questions or concerns as needed.]  Pharmacy Interventions   Pharmacy Dicussed/Reviewed Pharmacy Topics Reviewed  [medications reviewed. confirmed patient has recieved nebullizer medications]            SDOH assessments and interventions completed:  No  Care Coordination Interventions:  Yes, provided   Follow up plan: Follow up call scheduled for 05/30/23    Encounter Outcome:  Patient Visit Completed   Kathyrn Sheriff, RN, MSN, BSN, CCM Care Management Coordinator [phone]

## 2023-04-30 NOTE — Patient Instructions (Signed)
Visit Information  Thank you for taking time to visit with me today. Please don't hesitate to contact me if I can be of assistance to you.   Following are the goals we discussed today:  Continue to take medications as prescribed. Continue to attend provider visits as scheduled Continue to eat healthy, lean meats, vegetables, fruits, avoid saturated and transfats Contact provider with health questions or concerns as needed   Our next appointment is by telephone on 05/30/23 at 11:30 am  Please call the care guide team at [phone] if you need to cancel or reschedule your appointment.   If you are experiencing a Mental Health or Behavioral Health Crisis or need someone to talk to, please call the Suicide and Crisis Lifeline: 988 call the [place] National Suicide Prevention Lifeline: 872-165-1424 or TTY: 628-113-6545 TTY 646-704-2901) to talk to a trained counselor call 1-800-273-TALK (toll free, 24 hour hotline)   Kathyrn Sheriff, RN, MSN, BSN, CCM Care Management Coordinator [phone]

## 2023-05-01 DIAGNOSIS — I5040 Unspecified combined systolic (congestive) and diastolic (congestive) heart failure: Secondary | ICD-10-CM | POA: Diagnosis not present

## 2023-05-01 DIAGNOSIS — I110 Hypertensive heart disease with heart failure: Secondary | ICD-10-CM | POA: Diagnosis not present

## 2023-05-01 DIAGNOSIS — J1282 Pneumonia due to coronavirus disease 2019: Secondary | ICD-10-CM | POA: Diagnosis not present

## 2023-05-01 DIAGNOSIS — U071 COVID-19: Secondary | ICD-10-CM | POA: Diagnosis not present

## 2023-05-01 DIAGNOSIS — M459 Ankylosing spondylitis of unspecified sites in spine: Secondary | ICD-10-CM | POA: Diagnosis not present

## 2023-05-01 DIAGNOSIS — J84112 Idiopathic pulmonary fibrosis: Secondary | ICD-10-CM | POA: Diagnosis not present

## 2023-05-02 DIAGNOSIS — J1282 Pneumonia due to coronavirus disease 2019: Secondary | ICD-10-CM | POA: Diagnosis not present

## 2023-05-02 DIAGNOSIS — M459 Ankylosing spondylitis of unspecified sites in spine: Secondary | ICD-10-CM | POA: Diagnosis not present

## 2023-05-02 DIAGNOSIS — I110 Hypertensive heart disease with heart failure: Secondary | ICD-10-CM | POA: Diagnosis not present

## 2023-05-02 DIAGNOSIS — U071 COVID-19: Secondary | ICD-10-CM | POA: Diagnosis not present

## 2023-05-02 DIAGNOSIS — J84112 Idiopathic pulmonary fibrosis: Secondary | ICD-10-CM | POA: Diagnosis not present

## 2023-05-02 DIAGNOSIS — I5040 Unspecified combined systolic (congestive) and diastolic (congestive) heart failure: Secondary | ICD-10-CM | POA: Diagnosis not present

## 2023-05-06 DIAGNOSIS — J1282 Pneumonia due to coronavirus disease 2019: Secondary | ICD-10-CM | POA: Diagnosis not present

## 2023-05-06 DIAGNOSIS — U071 COVID-19: Secondary | ICD-10-CM | POA: Diagnosis not present

## 2023-05-06 DIAGNOSIS — I110 Hypertensive heart disease with heart failure: Secondary | ICD-10-CM | POA: Diagnosis not present

## 2023-05-06 DIAGNOSIS — M459 Ankylosing spondylitis of unspecified sites in spine: Secondary | ICD-10-CM | POA: Diagnosis not present

## 2023-05-06 DIAGNOSIS — J84112 Idiopathic pulmonary fibrosis: Secondary | ICD-10-CM | POA: Diagnosis not present

## 2023-05-06 DIAGNOSIS — I5040 Unspecified combined systolic (congestive) and diastolic (congestive) heart failure: Secondary | ICD-10-CM | POA: Diagnosis not present

## 2023-05-15 ENCOUNTER — Other Ambulatory Visit: Payer: Self-pay | Admitting: Emergency Medicine

## 2023-05-30 ENCOUNTER — Ambulatory Visit: Payer: Self-pay

## 2023-05-30 ENCOUNTER — Telehealth: Payer: Self-pay | Admitting: Emergency Medicine

## 2023-05-30 NOTE — Telephone Encounter (Signed)
For our records: Dee a palpative care nurse with [facility] reports that the pt has not been feeling well, his breathing has not been normal, he has chills, has been nauseas, and hasn't been eating by mouth. His wife is still giving him food through his feeding tube.   [facility] is going to try to send someone to do a home visit.

## 2023-05-30 NOTE — Patient Outreach (Signed)
  Care Coordination   Follow Up Visit Note   05/30/2023 Name: Earl Thomas MRN: [id number] DOB: 20-Feb-1951  Earl Thomas is a 72 y.o. year old male who sees Sagardia, Eilleen Kempf, MD for primary care. I spoke with  Blossom Hoops by phone today.  What matters to the patients health and wellness today?  Mr. Waltzer states he has had chills for the last couple of days and states is feeling a little more SOB than usual. He states he has discussed with palliative care nurse. And they think he may have picked up a virus. When reviewing pulmonary medications, Mr. Leesman states he does not use nebulizer machine as prescribed and is unsure about what nebulizer medications he is using. He states, "I just ask my wife to turn the oxygen". RNCM reinforced to need to use prn inhaler and nebulizer medications to open airway to try to minimize the need to increase his oxygen. He reports he has a pulse oximeter and reports his oxygen saturations are in the 90's. He reports the virus has decreased his appetite, but is still eating and drinking protein supplement. Spouse manages medications and was not home to assist with medication review. Per review of chart last pulmonary office visit on 04/11/23, patient had increased 2 lbs.  Goals Addressed             This Visit's Progress    Health management       Interventions Today    Flowsheet Row Most Recent Value  Chronic Disease   Chronic disease during today's visit Other, Congestive Heart Failure (CHF)  [Interstitial lung disease]  General Interventions   General Interventions Discussed/Reviewed General Interventions Reviewed, Doctor Visits, Communication with  [Evaluation of current treatment plan for health condition and patient's adherence to plan.]  Doctor Visits Discussed/Reviewed Doctor Visits Reviewed  [reviewed patient instructions from pulmonary office visit 10/24. reviewed upcoming scheduled appointments]  Communication with  Pharmacists  [message to clinical pharmacist regarding medication adherence/pulmonary medications]  Education Interventions   Education Provided Provided Education  Provided Verbal Education On Medication, When to see the doctor, Nutrition  [advised to continue to eat healthy, eat small frequent meals if needed, advised to continue to take medications as needed. advised to contact provider with any worsening in condition or with health questions or concerns.]  Nutrition Interventions   Nutrition Discussed/Reviewed Nutrition Reviewed, Increasing proteins  [discussed the importance or proteins]  Pharmacy Interventions   Pharmacy Dicussed/Reviewed Pharmacy Topics Reviewed, Medication Adherence  [medication reviewed, discussed importance of taking nebulizer treatments as prescribed to help with opening up airway to allow for easier breathing. also reinforced purpose of prn albuterol inhaler.]  Medication Adherence Not taking medication  [nebulizer medications. communicated with clinical pharmacist regading medication adherence.]            SDOH assessments and interventions completed:  No  Care Coordination Interventions:  Yes, provided   Follow up plan: Follow up call scheduled for 07/07/23 1:00 pm    Encounter Outcome:  Patient Visit Completed   Kathyrn Sheriff, RN, MSN, BSN, CCM Care Management Coordinator [phone]

## 2023-05-30 NOTE — Patient Instructions (Signed)
Visit Information  Thank you for taking time to visit with me today. Please don't hesitate to contact me if I can be of assistance to you.   Following are the goals we discussed today:  Continue to take medications as prescribed Continue to attend provider visits as scheduled Continue to eat healthy, lean meats, vegetables, fruits, avoid saturated and transfats Contact provider with any worsening health condition or with health questions or concerns as needed  Our next appointment is by telephone on 07/07/23 at 1:00 pm  Please call the care guide team at [phone] if you need to cancel or reschedule your appointment.   If you are experiencing a Mental Health or Behavioral Health Crisis or need someone to talk to, please call the Suicide and Crisis Lifeline: 988 call the [place] National Suicide Prevention Lifeline: 669-409-4966 or TTY: 807-232-3300 TTY 3144919700) to talk to a trained counselor call 1-800-273-TALK (toll free, 24 hour hotline)  Kathyrn Sheriff, RN, MSN, BSN, CCM Care Management Coordinator [phone]

## 2023-06-02 ENCOUNTER — Telehealth: Payer: Self-pay | Admitting: Emergency Medicine

## 2023-06-04 NOTE — Patient Outreach (Signed)
  Care Coordination   Case Closure  Visit Note   06/04/2023 Name: Earl Thomas MRN: [id number] DOB: 05/18/1951  Earl Thomas is a 72 y.o. year old male who sees Sagardia, Eilleen Kempf, MD for primary care.    Per review of chart patient passed away 2023-06-18. Case closed.    Goals Addressed             This Visit's Progress    COMPLETED: Health management       Interventions Today    Flowsheet Row Most Recent Value  General Interventions   General Interventions Discussed/Reviewed General Interventions Reviewed  [goals closed]            SDOH assessments and interventions completed:  No  Care Coordination Interventions:  No, not indicated   Follow up plan: No further intervention required.   Encounter Outcome:  Patient Visit Completed   Kathyrn Sheriff, RN, MSN, BSN, CCM Care Management Coordinator [phone]

## 2023-06-08 NOTE — Telephone Encounter (Signed)
Pt daughter called to inform Dr. Alvy Bimler that the pt has passed away this morning.

## 2023-06-08 NOTE — Telephone Encounter (Signed)
Yes, I just found out this morning.  Thank you

## 2023-06-08 NOTE — Telephone Encounter (Signed)
He needs to be evaluated probably at the emergency department.  Thanks.

## 2023-06-08 DEATH — deceased

## 2023-06-21 ENCOUNTER — Encounter: Payer: Self-pay | Admitting: Emergency Medicine

## 2023-06-23 ENCOUNTER — Ambulatory Visit: Payer: Medicare Other | Admitting: Emergency Medicine

## 2023-07-04 ENCOUNTER — Ambulatory Visit: Payer: Medicare Other | Admitting: Internal Medicine

## 2023-10-07 IMAGING — CT CT ANGIO CHEST
2 of 9 series · 18 of 36 positions shown · IV contrast (Omnipaque)
Comparison: Chest radiograph dated 11/10/2021 and CT dated
08/09/2021.

CLINICAL DATA: Concern for pulmonary embolism.

EXAM:
CT ANGIOGRAPHY CHEST WITH CONTRAST
TECHNIQUE: Multidetector CT imaging of the chest was performed using the
standard protocol during bolus administration of intravenous
contrast. Multiplanar CT image reconstructions and MIPs were
obtained to evaluate the vascular anatomy.

[Series 7: pe thins · axial · 0.64mm/px · z∈[+644,+894]mm · 17 of 280 slices shown]
[im 15/280  lung]
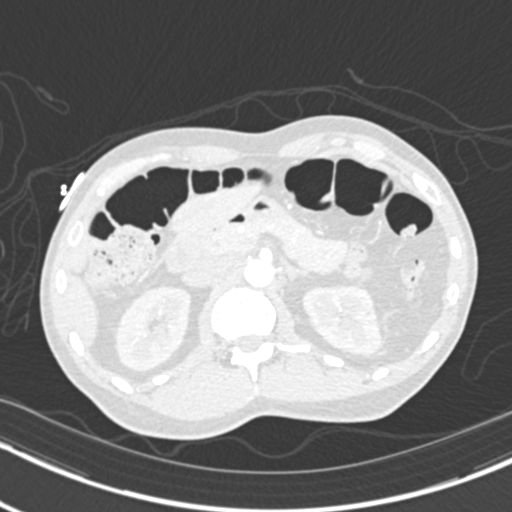
[im 30/280  mediastinal]
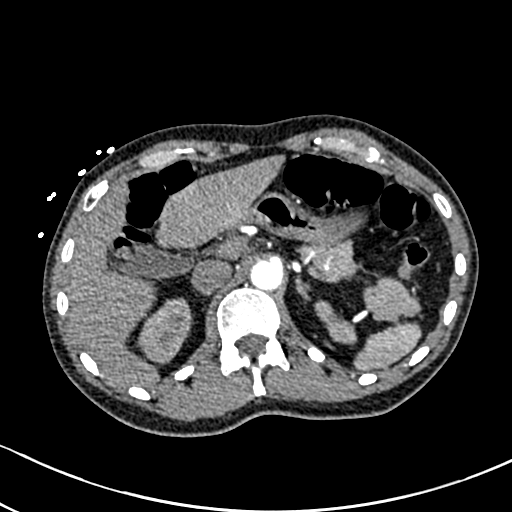
[im 45/280  lung]
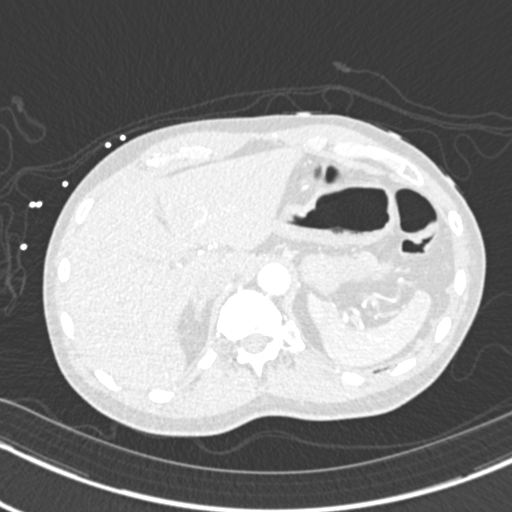
[im 59/280  mediastinal]
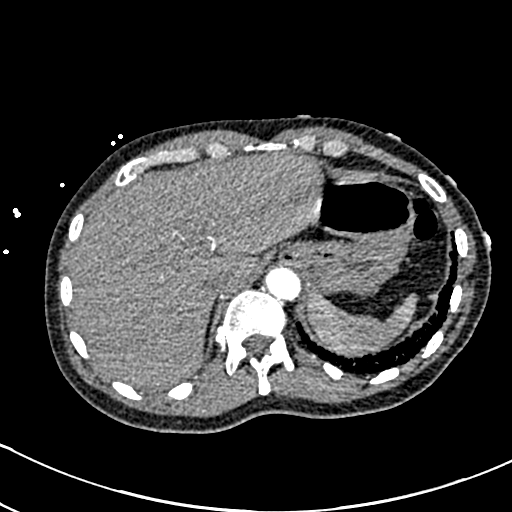
[im 74/280  lung]
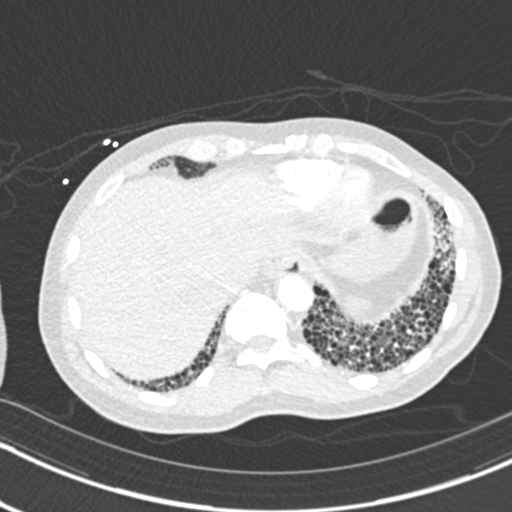
[im 89/280  mediastinal]
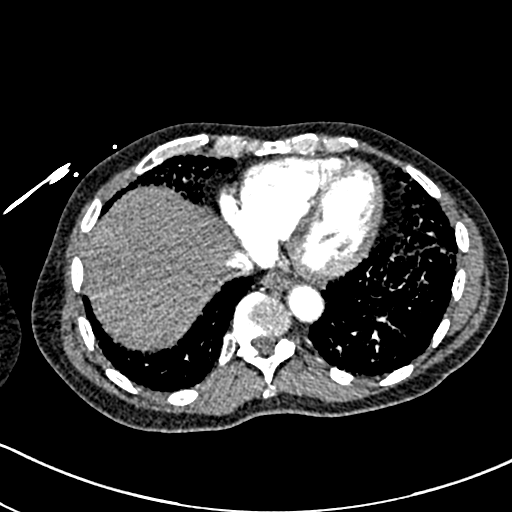
[im 103/280  lung]
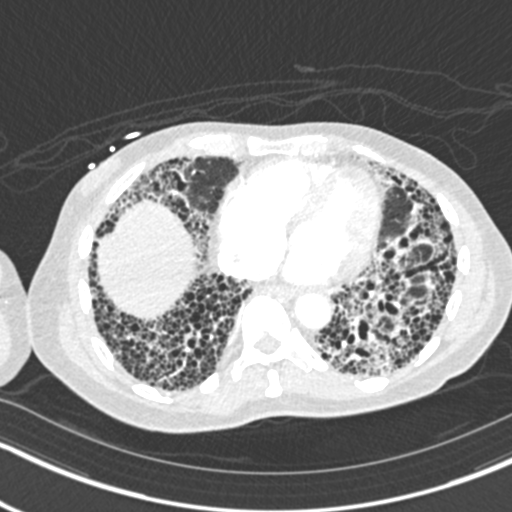
[im 118/280  mediastinal]
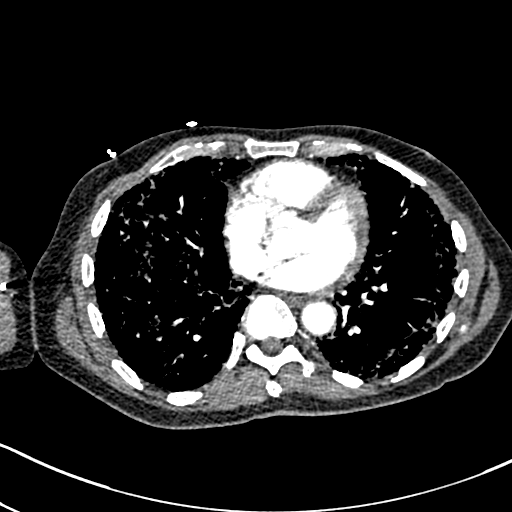
[im 147/280  lung]
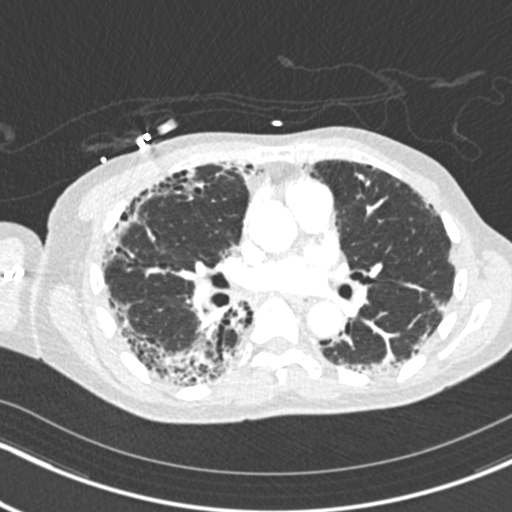
[im 162/280  mediastinal]
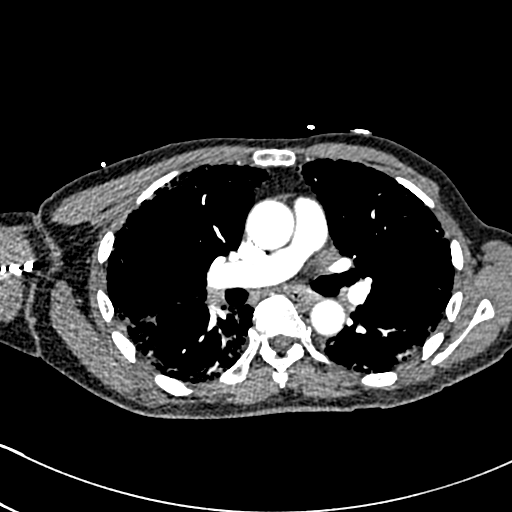
[im 177/280  lung]
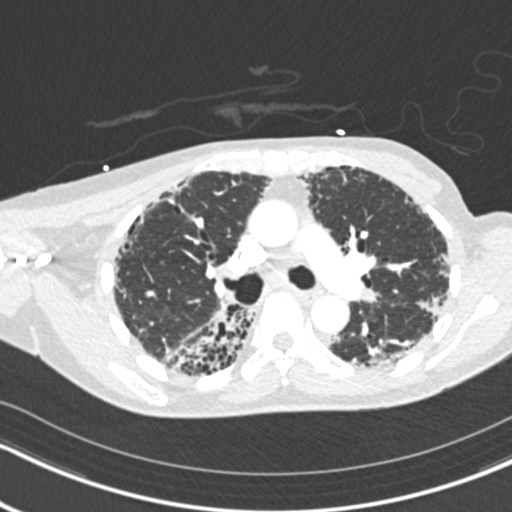
[im 191/280  mediastinal]
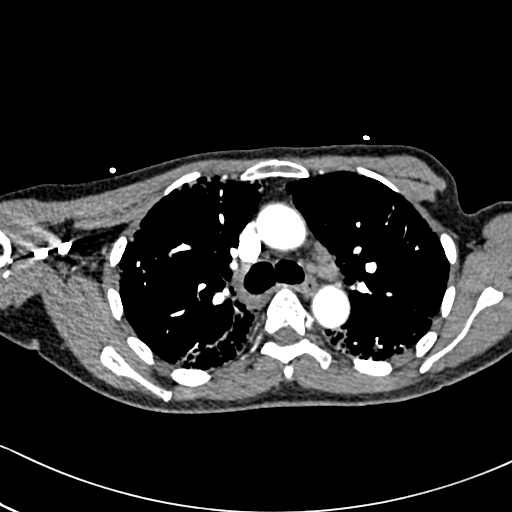
[im 206/280  lung]
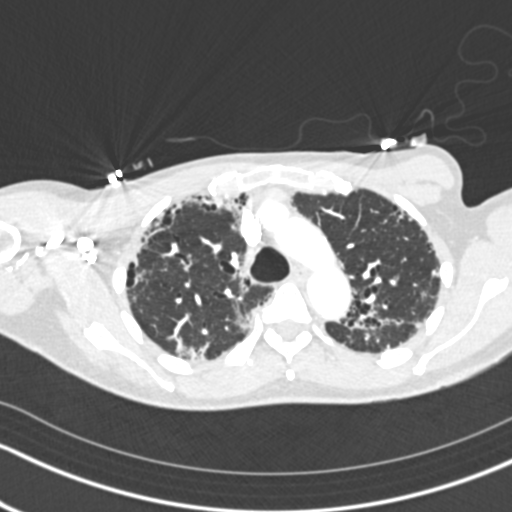
[im 221/280  mediastinal]
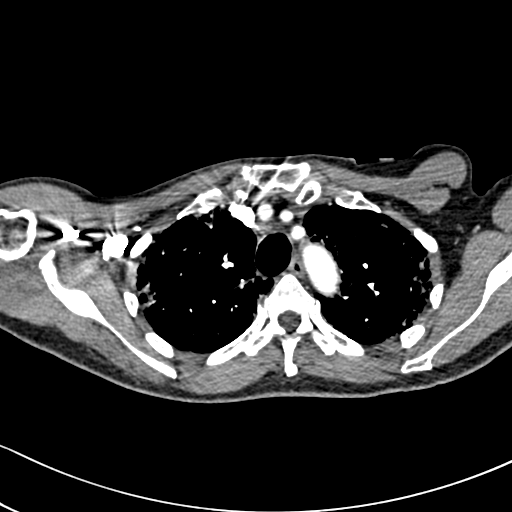
[im 235/280  lung]
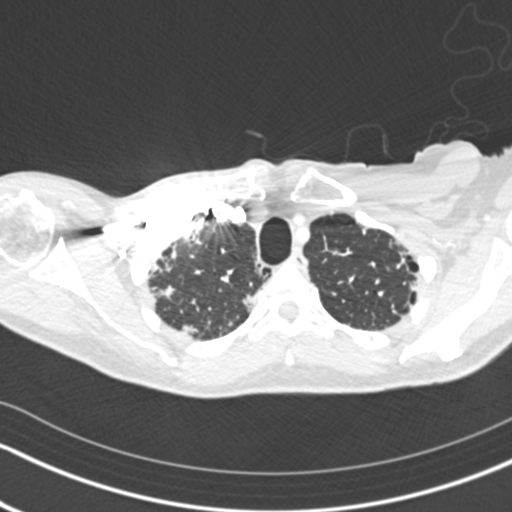
[im 250/280  mediastinal]
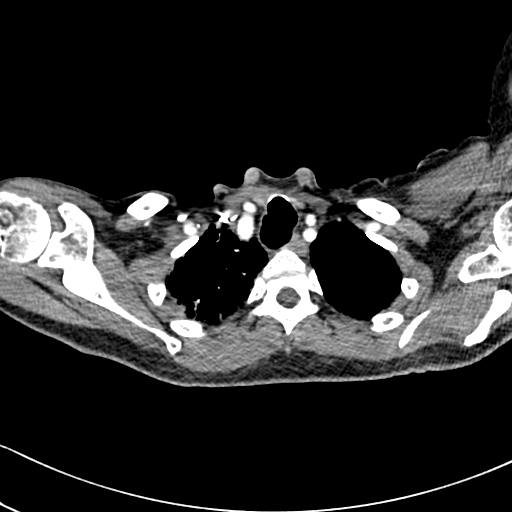
[im 265/280  lung]
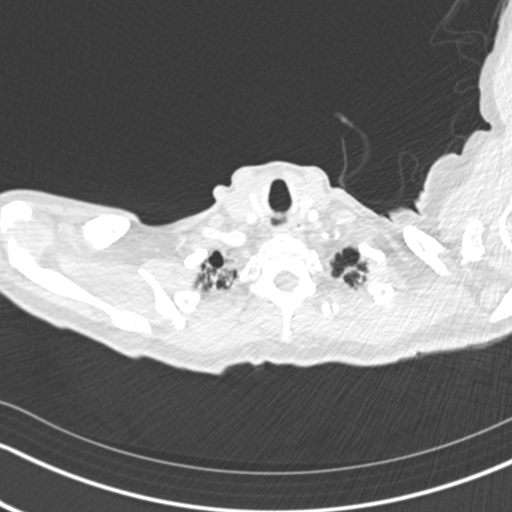

[Series 8: pe coronal mpr · coronal · 0.59mm/px · 1 of 108 slices shown]
[im 54/108  mediastinal]
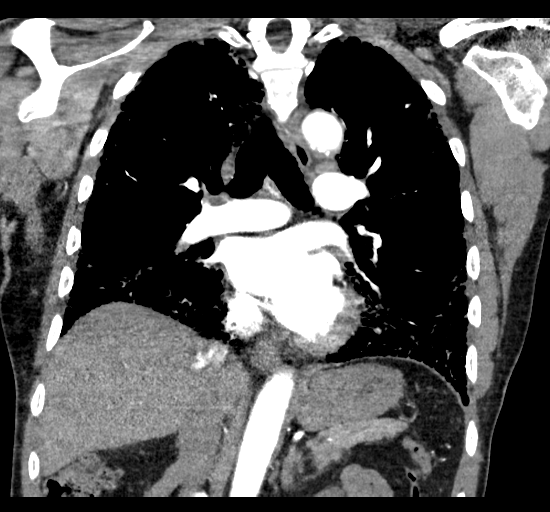

[18 of 36 positions shown; findings below may reference images not displayed]

RADIATION DOSE REDUCTION: This exam was performed according to the
departmental dose-optimization program which includes automated
exposure control, adjustment of the mA and/or kV according to
patient size and/or use of iterative reconstruction technique.

CONTRAST:  100mL OMNIPAQUE IOHEXOL 350 MG/ML SOLN
FINDINGS: Evaluation of this exam is limited due to respiratory motion
artifact.

Cardiovascular: There is no cardiomegaly or pericardial effusion.
Mild atherosclerotic calcification of the aortic arch. No aneurysmal
dilatation or dissection. The origins of the great vessels of the
aortic arch appear patent. Evaluation of the pulmonary arteries is
limited due to respiratory motion artifact. No pulmonary artery
embolus identified.

Mediastinum/Nodes: Top-normal right hilar lymph node measures 9 mm.
No mediastinal adenopathy. The esophagus is grossly unremarkable. No
mediastinal fluid collection.

Lungs/Pleura: There is background of interstitial lung disease with
bibasilar predominant subpleural reticulation and honeycombing. No
new consolidative changes. There is no pleural effusion or
pneumothorax. The central airways are patent.

Upper Abdomen: No acute abnormality.

Musculoskeletal: No chest wall abnormality. No acute or significant
osseous findings.

Review of the MIP images confirms the above findings.
IMPRESSION: 1. No acute intrathoracic pathology. No CT evidence of pulmonary
artery embolus.
2. Interstitial lung disease.
3. Aortic Atherosclerosis (GPHDD-A29.9).
